# Patient Record
Sex: Male | Born: 1945 | Race: Black or African American | Hispanic: No | Marital: Single | Smoking: Never smoker
Health system: Southern US, Community
[De-identification: ages and names within clinical notes are randomized; demographics above are authoritative.]

## PROBLEM LIST (undated history)

## (undated) DIAGNOSIS — I639 Cerebral infarction, unspecified: Secondary | ICD-10-CM

## (undated) DIAGNOSIS — E119 Type 2 diabetes mellitus without complications: Secondary | ICD-10-CM

## (undated) DIAGNOSIS — I10 Essential (primary) hypertension: Secondary | ICD-10-CM

---

## 2019-09-19 ENCOUNTER — Encounter (HOSPITAL_COMMUNITY): Payer: Self-pay

## 2019-09-19 ENCOUNTER — Ambulatory Visit (INDEPENDENT_AMBULATORY_CARE_PROVIDER_SITE_OTHER): Payer: Medicare Other

## 2019-09-19 ENCOUNTER — Ambulatory Visit (HOSPITAL_COMMUNITY)
Admission: EM | Admit: 2019-09-19 | Discharge: 2019-09-19 | Disposition: A | Discharge status: Home / Self Care | Payer: Medicare Other | Attending: Family Medicine | Admitting: Family Medicine

## 2019-09-19 DIAGNOSIS — R059 Cough, unspecified: Secondary | ICD-10-CM

## 2019-09-19 DIAGNOSIS — J069 Acute upper respiratory infection, unspecified: Secondary | ICD-10-CM | POA: Diagnosis present

## 2019-09-19 DIAGNOSIS — I10 Essential (primary) hypertension: Secondary | ICD-10-CM | POA: Insufficient documentation

## 2019-09-19 DIAGNOSIS — R030 Elevated blood-pressure reading, without diagnosis of hypertension: Secondary | ICD-10-CM | POA: Diagnosis not present

## 2019-09-19 DIAGNOSIS — R05 Cough: Secondary | ICD-10-CM | POA: Insufficient documentation

## 2019-09-19 DIAGNOSIS — M47814 Spondylosis without myelopathy or radiculopathy, thoracic region: Secondary | ICD-10-CM | POA: Diagnosis not present

## 2019-09-19 DIAGNOSIS — U071 COVID-19: Secondary | ICD-10-CM | POA: Insufficient documentation

## 2019-09-19 DIAGNOSIS — I160 Hypertensive urgency: Secondary | ICD-10-CM | POA: Diagnosis not present

## 2019-09-19 IMAGING — DX DG CHEST 2V
2 series · 2 of 2 positions shown · non-contrast
Comparison: None.

CLINICAL DATA: Per pt: sick for a week, dry cough, no fever, today
BP was up. No history of cardiac or respiratory disease. Non smoker,
no HBP, no diabetes. Tested today for Covid

EXAM:
CHEST - 2 VIEW

[chest pa]
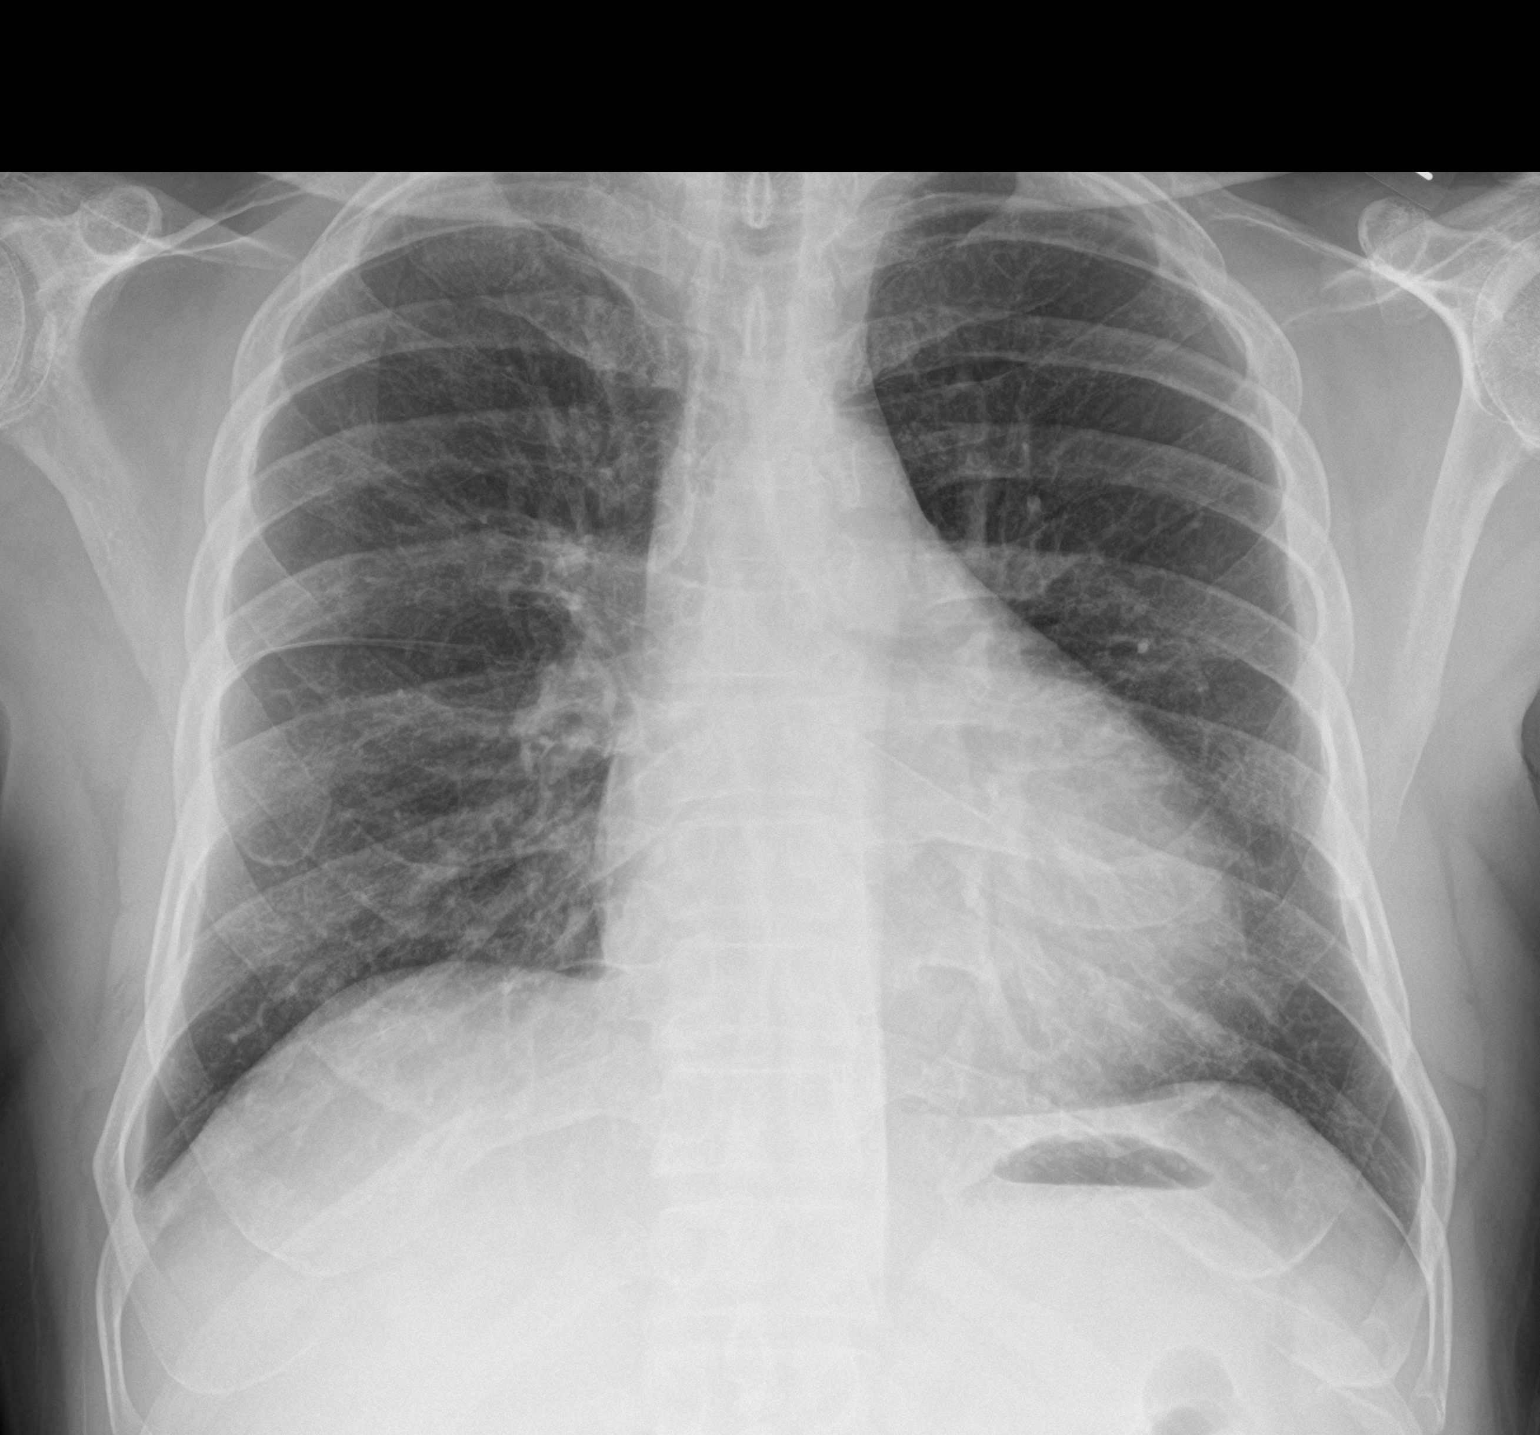

[chest lat]
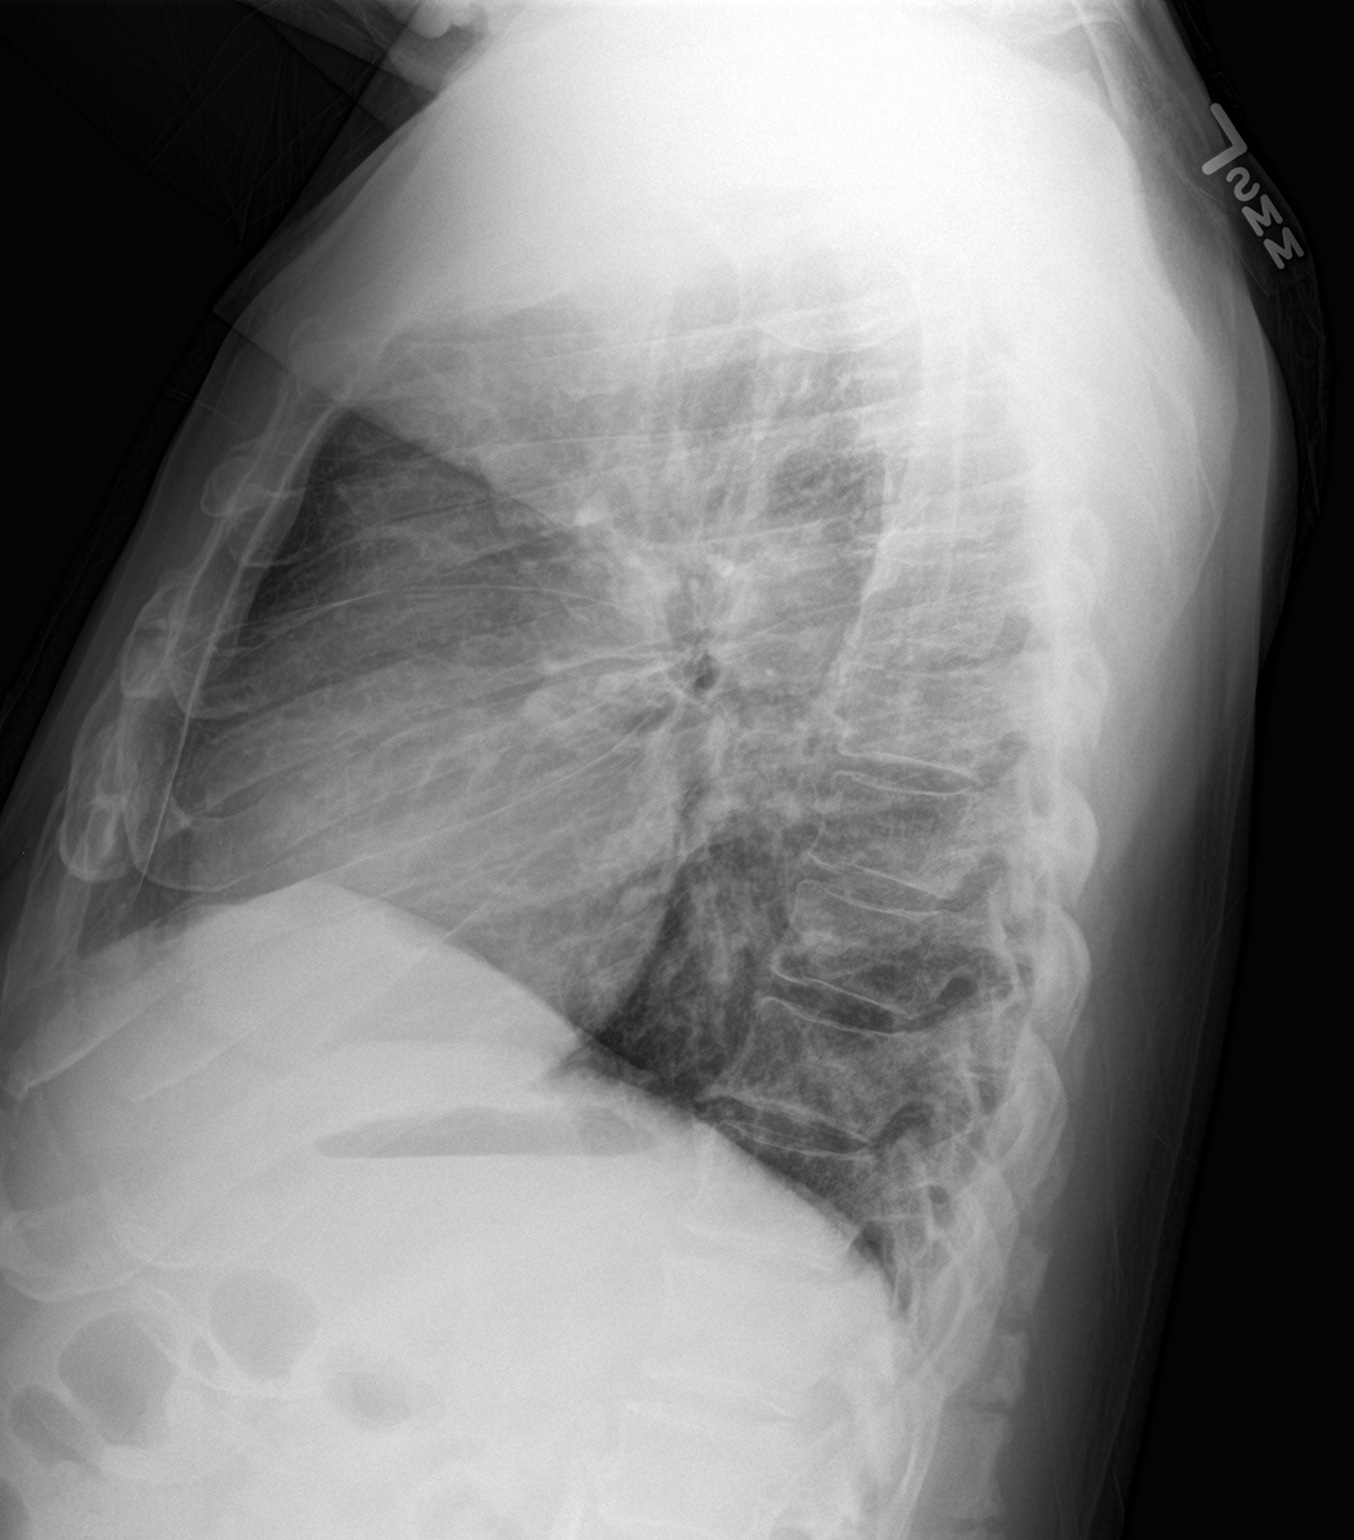

[2 of 2 positions shown; findings below may reference images not displayed]

FINDINGS: Normal mediastinal contours. Heart size is upper limits of normal.
The lungs are clear. No pneumothorax or pleural effusion. Mild
degenerative changes in the thoracic spine.
IMPRESSION: No active cardiopulmonary disease.

## 2019-09-19 MED ORDER — BENZONATATE 100 MG PO CAPS: 100.0000 mg | ORAL_CAPSULE | Freq: Three times a day (TID) | ORAL | 0 refills | Status: DC | PRN

## 2019-09-19 MED ORDER — AMLODIPINE BESYLATE 5 MG PO TABS: 5.0000 mg | ORAL_TABLET | Freq: Every day | ORAL | 0 refills | Status: DC

## 2019-09-19 NOTE — ED Triage Notes (Signed)
Pt C/O Non productive cough, symptom started over a week.

## 2019-09-19 NOTE — ED Provider Notes (Signed)
[place]   MRN: [id number] DOB: [birth year]  Subjective:   Joseph Patrick is a 74 y.o. male presenting for 1 week history of persistent dry hacking cough.  Patient reports that he generally is in very good health, states that normally he gets a cold symptom and is resolved with over-the-counter medications.  However, this particular episode is not resolving.  Denies getting regular care, does not have a PCP.  Denies history of hypertension but has been told before at different visits that he has elevated blood pressure.  He is not currently taking any medications and has no known food or drug allergies.  Denies past medical and surgical history.  History reviewed. No pertinent family history.  Denies smoking cigarettes.  Review of Systems  Constitutional: Negative for fever and malaise/fatigue.  HENT: Negative for congestion, ear pain, sinus pain and sore throat.   Eyes: Negative for blurred vision, double vision, discharge and redness.  Respiratory: Positive for cough. Negative for hemoptysis, shortness of breath and wheezing.   Cardiovascular: Negative for chest pain.  Gastrointestinal: Negative for abdominal pain, diarrhea, nausea and vomiting.  Genitourinary: Negative for dysuria, flank pain and hematuria.  Musculoskeletal: Negative for myalgias.  Skin: Negative for rash.  Neurological: Negative for dizziness, tingling, sensory change, speech change, weakness and headaches.  Psychiatric/Behavioral: Negative for depression and substance abuse.     Objective:   Vitals: BP (!) 179/102 (BP Location: Left Arm)   Pulse (!) 103   Temp 98.8 F (37.1 C) (Oral)   Resp 18   SpO2 99%   BP recheck on left arm, seated position: 198/93. Pulse was 98 on recheck.  Physical Exam Constitutional:      General: He is not in acute distress.    Appearance: Normal appearance. He is well-developed. He is not ill-appearing, toxic-appearing or diaphoretic.  HENT:     Head:  Normocephalic and atraumatic.     Right Ear: External ear normal.     Left Ear: External ear normal.     Nose: Nose normal.     Mouth/Throat:     Mouth: Mucous membranes are moist.     Pharynx: Oropharynx is clear.  Eyes:     General: No scleral icterus.    Extraocular Movements: Extraocular movements intact.     Pupils: Pupils are equal, round, and reactive to light.  Cardiovascular:     Rate and Rhythm: Normal rate and regular rhythm.     Heart sounds: Normal heart sounds. No murmur. No friction rub. No gallop.   Pulmonary:     Effort: Pulmonary effort is normal. No respiratory distress.     Breath sounds: Normal breath sounds. No stridor. No wheezing, rhonchi or rales.  Musculoskeletal:     Right lower leg: No edema.     Left lower leg: No edema.  Skin:    General: Skin is warm and dry.  Neurological:     Mental Status: He is alert and oriented to person, place, and time.     Cranial Nerves: No cranial nerve deficit.     Motor: No weakness.     Coordination: Coordination normal.     Gait: Gait normal.     Deep Tendon Reflexes: Reflexes normal.     Comments: Negative Romberg and pronator drift.  Psychiatric:        Mood and Affect: Mood normal.        Behavior: Behavior normal.        Thought Content: Thought content normal.  Judgment: Judgment normal.     DG Chest 2 View  Result Date: 09/19/2019 CLINICAL DATA:  Per pt: sick for a week, dry cough, no fever, today BP was up. No history of cardiac or respiratory disease. Non smoker, no HBP, no diabetes. Tested today for Covid EXAM: CHEST - 2 VIEW COMPARISON:  None. FINDINGS: Normal mediastinal contours. Heart size is upper limits of normal. The lungs are clear. No pneumothorax or pleural effusion. Mild degenerative changes in the thoracic spine. IMPRESSION: No active cardiopulmonary disease. Electronically Signed   By: Emmaline Kluver M.D.   On: 09/19/2019 17:36   Assessment and Plan :   1. Cough   2. Essential  hypertension   3. Hypertensive urgency   4. Elevated blood pressure reading     Manage for viral illness, COVID-19 testing pending.  Recommended supportive care.  Patient does not have physical exam findings suggestive of intracranial process, stroke or ACS.  Start amlodipine for management of hypertension, recommended dietary modifications.  Establish care with new PCP, information provided to the patient. Counseled patient on potential for adverse effects with medications prescribed/recommended today, ER and return-to-clinic precautions discussed, patient verbalized understanding.    Wallis Bamberg, [place] 09/19/19 1743

## 2019-09-19 NOTE — Discharge Instructions (Addendum)
We will manage this as a viral syndrome. For sore throat or cough try using a honey-based tea. Use 3 teaspoons of honey with juice squeezed from half lemon. Place shaved pieces of ginger into 1/2-1 cup of water and warm over stove top. Then mix the ingredients and repeat every 4 hours as needed. Please take Tylenol 500mg  every 6 hours. Hydrate very well with at least 2 liters of water. Eat light meals such as soups to replenish electrolytes and soft fruits, veggies. Start an antihistamine like Zyrtec (cetirizine) at 10mg  daily for postnasal drainage, sinus congestion.   For diabetes, please make sure you are avoiding starchy, carbohydrate foods like pasta, breads, pastry, rice, potatoes, desserts. These foods can elevated your blood sugar. Also, limit your alcohol drinking to 1 per day, avoid sodas, sweet teas. For elevated blood pressure, make sure you are monitoring salt in your diet.  Do not eat restaurant foods and limit processed foods at home.  Processed foods include things like frozen meals preseason meats and dinners.  Make sure your pain attention to sodium labels on foods you by at the grocery store.  For seasoning you can use a brand called Mrs. Dash which includes a lot of salt free seasonings.  Salads - kale, spinach, cabbage, spring mix; use seeds like pumpkin seeds or sunflower seeds, almonds; you can also use 1-2 hard boiled eggs in your salads Fruits - avocadoes, berries (blueberries, raspberries, blackberries), apples, oranges Vegetables - aspargus, cauliflower, broccoli, green beans, brussel spouts, bell peppers; stay away from starchy vegetables like potatoes, carrots, peas  Regarding meat it is better to eat lean meats and limit your red meat consumption including pork.  Wild caught fish, chicken breast are good options.  Do not eat any foods on this list that you are allergic to.

## 2019-09-20 LAB — NOVEL CORONAVIRUS, NAA (HOSP ORDER, SEND-OUT TO REF LAB; TAT 18-24 HRS): SARS-CoV-2, NAA: DETECTED — AB

## 2019-09-22 ENCOUNTER — Telehealth: Payer: Self-pay | Admitting: Emergency Medicine

## 2019-09-22 NOTE — Telephone Encounter (Signed)
Your test for COVID-19 was positive, meaning that you were infected with the novel coronavirus and could give the germ to others.  Please continue isolation at home for at least 10 days since the start of your symptoms. If you do not have symptoms, please isolate at home for 10 days from the day you were tested. Once you complete your 10 day quarantine, you may return to normal activities as long as you've not had a fever for over 24 hours(without taking fever reducing medicine) and your symptoms are improving. Please continue good preventive care measures, including:  frequent hand-washing, avoid touching your face, cover coughs/sneezes, stay out of crowds and keep a 6 foot distance from others.  Go to the nearest hospital emergency room if fever/cough/breathlessness are severe or illness seems like a threat to life.  Attempted to reach patient. No answer at this time. Voicemail left.    

## 2019-09-23 ENCOUNTER — Telehealth (HOSPITAL_COMMUNITY): Payer: Self-pay | Admitting: Emergency Medicine

## 2019-09-23 NOTE — Telephone Encounter (Signed)
Patient contacted by phone and made aware of  positive covid  results. Pt verbalized understanding and had all questions answered.    

## 2020-02-19 ENCOUNTER — Ambulatory Visit (INDEPENDENT_AMBULATORY_CARE_PROVIDER_SITE_OTHER): Payer: Medicare Other

## 2020-02-19 ENCOUNTER — Ambulatory Visit (HOSPITAL_COMMUNITY)
Admission: EM | Admit: 2020-02-19 | Discharge: 2020-02-19 | Disposition: A | Discharge status: Home / Self Care | Payer: Medicare Other | Attending: Family Medicine | Admitting: Family Medicine

## 2020-02-19 ENCOUNTER — Other Ambulatory Visit: Payer: Self-pay

## 2020-02-19 ENCOUNTER — Encounter (HOSPITAL_COMMUNITY): Payer: Self-pay

## 2020-02-19 DIAGNOSIS — R05 Cough: Secondary | ICD-10-CM | POA: Insufficient documentation

## 2020-02-19 DIAGNOSIS — R059 Cough, unspecified: Secondary | ICD-10-CM

## 2020-02-19 DIAGNOSIS — R0602 Shortness of breath: Secondary | ICD-10-CM | POA: Insufficient documentation

## 2020-02-19 LAB — CBC
HCT: 47.5 % (ref 39.0–52.0)
Hemoglobin: 15.7 g/dL (ref 13.0–17.0)
MCH: 29.1 pg (ref 26.0–34.0)
MCHC: 33.1 g/dL (ref 30.0–36.0)
MCV: 88.1 fL (ref 80.0–100.0)
Platelets: 246 10*3/uL (ref 150–400)
RBC: 5.39 MIL/uL (ref 4.22–5.81)
RDW: 12.7 % (ref 11.5–15.5)
WBC: 5.5 10*3/uL (ref 4.0–10.5)
nRBC: 0 % (ref 0.0–0.2)

## 2020-02-19 LAB — COMPREHENSIVE METABOLIC PANEL
ALT: 23 U/L (ref 0–44)
AST: 18 U/L (ref 15–41)
Albumin: 3.7 g/dL (ref 3.5–5.0)
Alkaline Phosphatase: 74 U/L (ref 38–126)
Anion gap: 8 (ref 5–15)
BUN: 14 mg/dL (ref 8–23)
CO2: 26 mmol/L (ref 22–32)
Calcium: 9.3 mg/dL (ref 8.9–10.3)
Chloride: 103 mmol/L (ref 98–111)
Creatinine, Ser: 1.35 mg/dL — ABNORMAL HIGH (ref 0.61–1.24)
GFR calc Af Amer: 60 mL/min — ABNORMAL LOW (ref >60)
GFR calc non Af Amer: 51 mL/min — ABNORMAL LOW (ref >60)
Glucose, Bld: 259 mg/dL — ABNORMAL HIGH (ref 70–99)
Potassium: 3.9 mmol/L (ref 3.5–5.1)
Sodium: 137 mmol/L (ref 135–145)
Total Bilirubin: 0.9 mg/dL (ref 0.3–1.2)
Total Protein: 7.9 g/dL (ref 6.5–8.1)

## 2020-02-19 LAB — BRAIN NATRIURETIC PEPTIDE: B Natriuretic Peptide: 311.6 pg/mL — ABNORMAL HIGH (ref 0.0–100.0)

## 2020-02-19 IMAGING — DX DG CHEST 2V
2 series · 2 of 2 positions shown · non-contrast
Comparison: Chest x-ray [DATE].

CLINICAL DATA: 73-year-old male with history of cough, chest pain
and shortness of breath for the past 2 weeks.

EXAM:
CHEST - 2 VIEW

[chest pa]
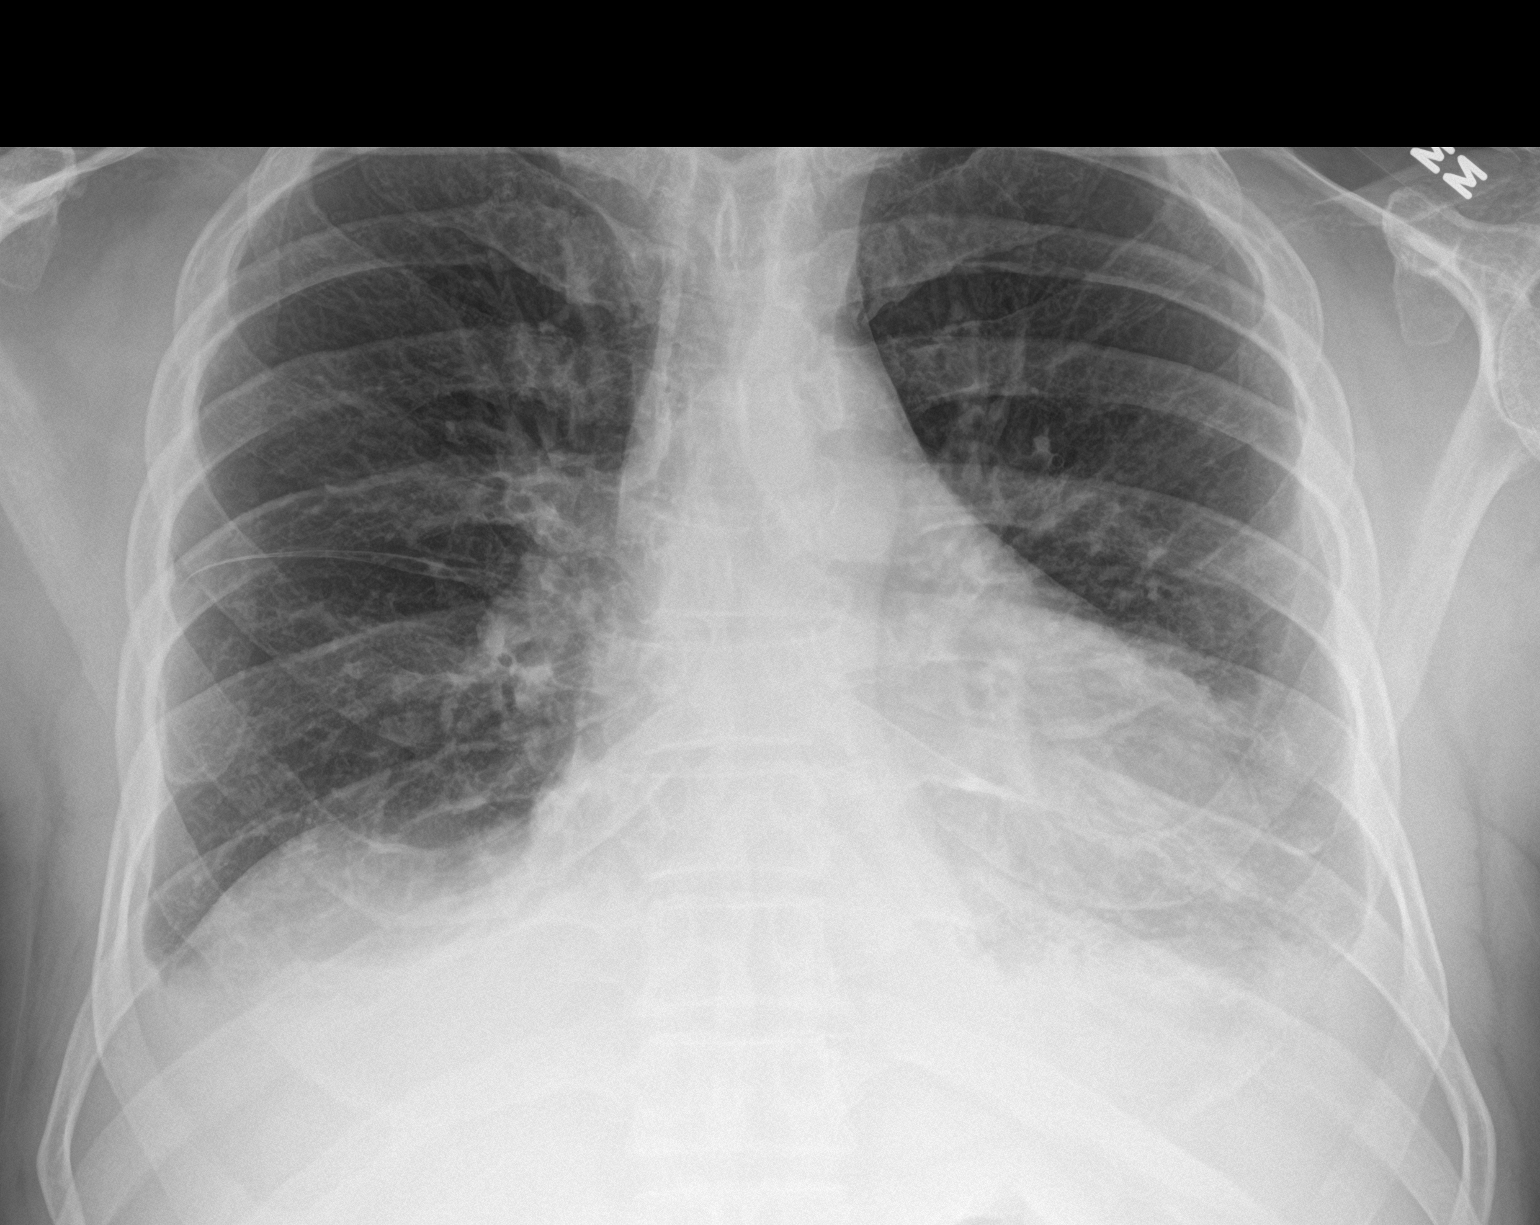

[chest lat]
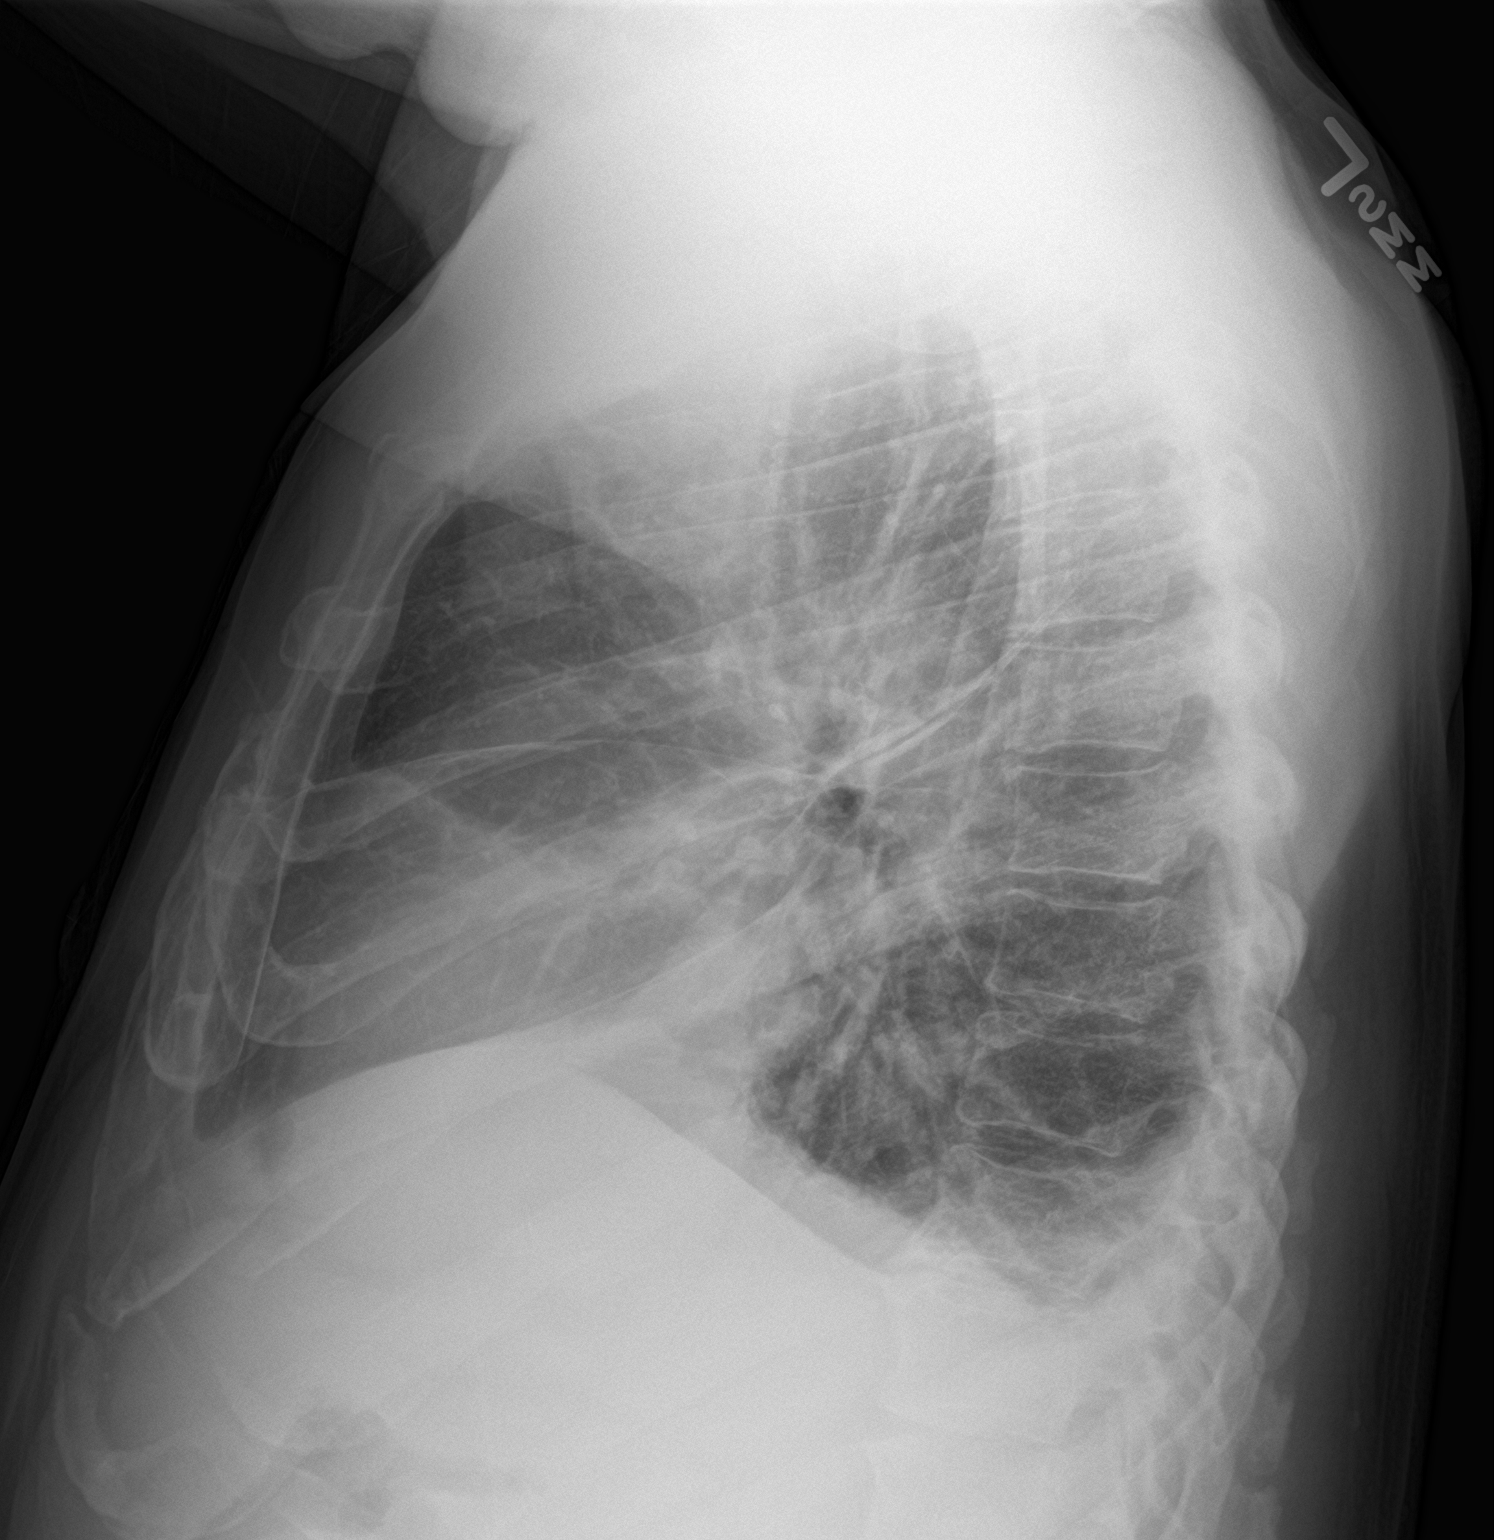

[2 of 2 positions shown; findings below may reference images not displayed]

FINDINGS: Small bilateral pleural effusions with bibasilar opacities which are
favored to reflect areas of subsegmental atelectasis. No definite
consolidative airspace disease. No pneumothorax. No evidence of
pulmonary edema. Heart size is normal. Upper mediastinal contours
are within normal limits.
IMPRESSION: 1. Small bilateral pleural effusions with probable bibasilar
subsegmental atelectasis.

## 2020-02-19 MED ORDER — METOPROLOL TARTRATE 50 MG PO TABS: 50.0000 mg | ORAL_TABLET | Freq: Two times a day (BID) | ORAL | 1 refills | Status: DC

## 2020-02-19 NOTE — ED Triage Notes (Signed)
Pt c/o non-productive cough for approx 2.5 weeks with SOB when lying down or attempting to take a deep breath and post nasal drip.  Pt also reports diarrhea four days ago, now resolved. Denies CP, diaphoresis, dizziness, or pain to left arm, back pain, or sore throat, fever, chills, abdom pain, n/v, nasal congestion.  Pt states he was Rx HTN meds when he was last seen at [facility], but only took them for 1 month and did not see a PMD for ongoing monitoring/tx.  Pt c/o recurrent calf cramps for approx 4 years with some weakness.

## 2020-02-19 NOTE — Discharge Instructions (Addendum)
Your x-ray showed some fluid in your lungs and a very enlarged heart most likely due to your uncontrolled high blood pressure. You are also tachycardic today or meaning that your heart rate is too fast I will give you medication to slow your heart rate down and decrease your blood pressure. I am doing some blood work and will call you when I get the results. Highly recommend following up with primary for referral to cardiology If your shortness of breath worsens you will need to go to the ER.

## 2020-02-19 NOTE — ED Notes (Signed)
Advised pt that we are waiting on final radiology reading of CXR and [facility] med provider to review findings.

## 2020-02-19 NOTE — ED Provider Notes (Signed)
[facility]    CSN: [id number] Arrival date & time: 02/19/20  0623      History   Chief Complaint Chief Complaint  Patient presents with  . Shortness of Breath    HPI Joseph Patrick is a 74 y.o. male.   Patient is a 74 year old male presents today for approximately 2 and half weeks of nonproductive cough shortness of breath lying down and taking a deep breath.  He has had some left-sided postnasal drip.  Symptoms, wax and wane.  No lower extremity swelling.  No chest pain, diaphoresis, dizziness, arm pain, sore throat, chills, fever, abdominal pain, nausea, vomiting.  Was seen here 4 months ago diagnosed with Covid.  At that time was started on amlodipine for extremely elevated blood pressure.  He took this for 1 month and then stopped.  He has not taken anything since.  He does not have a primary care doctor.  Denies any headache, dizziness or blurred vision.  He also has mild intermittent muscle cramps from time to time.  Admits to not drinking any water.  Very active playing golf weekly.  ROS per HPI      History reviewed. No pertinent past medical history.  There are no problems to display for this patient.   History reviewed. No pertinent surgical history.     Home Medications    Prior to Admission medications   Medication Sig Start Date End Date Taking? Authorizing Provider  metoprolol tartrate (LOPRESSOR) 50 MG tablet Take 1 tablet (50 mg total) by mouth 2 (two) times daily. 02/19/20   Minh Jasper, Gloris Manchester A, NP  amLODipine (NORVASC) 5 MG tablet Take 1 tablet (5 mg total) by mouth daily. 09/19/19 02/19/20  Wallis Bamberg, PA-C    Family History Family History  Problem Relation Age of Onset  . Heart failure Sister     Social History Social History   Tobacco Use  . Smoking status: Never Smoker  . Smokeless tobacco: Never Used  Substance Use Topics  . Alcohol use: Never  . Drug use: Never     Allergies   Patient has no known allergies.   Review of  Systems Review of Systems   Physical Exam Triage Vital Signs ED Triage Vitals  Enc Vitals Group     BP 02/19/20 0843 (!) 218/114     Pulse Rate 02/19/20 0843 (!) 115     Resp 02/19/20 0843 (!) 32     Temp 02/19/20 0843 98.6 F (37 C)     Temp Source 02/19/20 0843 Oral     SpO2 02/19/20 0843 98 %     Weight --      Height --      Head Circumference --      Peak Flow --      Pain Score 02/19/20 1055 0     Pain Loc --      Pain Edu? --      Excl. in GC? --    No data found.  Updated Vital Signs BP (!) 220/102 (BP Location: Left Arm) Comment: re-eval  Pulse (!) 108 Comment: re-eval  Temp 98.6 F (37 C) (Oral)   Resp (!) 32   SpO2 98%   Visual Acuity Right Eye Distance:   Left Eye Distance:   Bilateral Distance:    Right Eye Near:   Left Eye Near:    Bilateral Near:     Physical Exam Vitals and nursing note reviewed.  Constitutional:      General: He  is not in acute distress.    Appearance: Normal appearance. He is not ill-appearing, toxic-appearing or diaphoretic.  HENT:     Head: Normocephalic and atraumatic.     Nose: Nose normal.     Mouth/Throat:     Pharynx: Oropharynx is clear.  Eyes:     Conjunctiva/sclera: Conjunctivae normal.  Cardiovascular:     Rate and Rhythm: Tachycardia present.     Heart sounds: Normal heart sounds.  Pulmonary:     Effort: Pulmonary effort is normal. No respiratory distress.     Breath sounds: Normal breath sounds. No stridor. No wheezing, rhonchi or rales.     Comments: Decreased lung sounds in bases Speaking in full sentences.  No dyspnea or distress. No tachypnea  Chest:     Chest wall: No tenderness.  Musculoskeletal:        General: Normal range of motion.     Cervical back: Normal range of motion.     Right lower leg: No edema.     Left lower leg: No edema.  Skin:    General: Skin is warm and dry.  Neurological:     Mental Status: He is alert.  Psychiatric:        Mood and Affect: Mood normal.      UC  Treatments / Results  Labs (all labs ordered are listed, but only abnormal results are displayed) Labs Reviewed  CBC  BRAIN NATRIURETIC PEPTIDE  COMPREHENSIVE METABOLIC PANEL    EKG   Radiology DG Chest 2 View  Result Date: 02/19/2020 CLINICAL DATA:  74 year old male with history of cough, chest pain and shortness of breath for the past 2 weeks. EXAM: CHEST - 2 VIEW COMPARISON:  Chest x-ray 09/19/2019. FINDINGS: Small bilateral pleural effusions with bibasilar opacities which are favored to reflect areas of subsegmental atelectasis. No definite consolidative airspace disease. No pneumothorax. No evidence of pulmonary edema. Heart size is normal. Upper mediastinal contours are within normal limits. IMPRESSION: 1. Small bilateral pleural effusions with probable bibasilar subsegmental atelectasis. Electronically Signed   By: Vinnie Langton M.D.   On: 02/19/2020 09:53    Procedures Procedures (including critical care time)  Medications Ordered in UC Medications - No data to display  Initial Impression / Assessment and Plan / UC Course  I have reviewed the triage vital signs and the nursing notes.  Pertinent labs & imaging results that were available during my care of the patient were reviewed by me and considered in my medical decision making (see chart for details).     Cough and shortness of breath Patient x-ray with small bilateral pleural effusions with atelectasis. This is most likely the cause of his symptoms.  He has extremely elevated blood pressure here today at 220/102. EKG with tachycardia and LVH most likely due to uncontrolled high blood pressure. Went over case with Dr. Lanny Cramp.  Recommend draw basic lab work and BNP Start on metoprolol twice daily for rate and blood pressure control. Spoke with patient multiple times about seriousness of him following up with primary care and cardiology sooner than later.  Patient reporting he will call today to make an appointment  for primary care Recommend if symptoms worsen he will need to go to the ER. Patient understand and agree. Final Clinical Impressions(s) / UC Diagnoses   Final diagnoses:  SOB (shortness of breath)  Cough     Discharge Instructions     Your x-ray showed some fluid in your lungs and a very enlarged heart most  likely due to your uncontrolled high blood pressure. You are also tachycardic today or meaning that your heart rate is too fast I will give you medication to slow your heart rate down and decrease your blood pressure. I am doing some blood work and will call you when I get the results. Highly recommend following up with primary for referral to cardiology If your shortness of breath worsens you will need to go to the ER.    ED Prescriptions    Medication Sig Dispense Auth. Provider   metoprolol tartrate (LOPRESSOR) 50 MG tablet Take 1 tablet (50 mg total) by mouth 2 (two) times daily. 60 tablet Kilan Banfill A, NP     PDMP not reviewed this encounter.   Janace Aris, NP 02/19/20 1119

## 2020-02-22 ENCOUNTER — Ambulatory Visit (HOSPITAL_COMMUNITY): Payer: Medicare Other

## 2020-02-22 ENCOUNTER — Ambulatory Visit (HOSPITAL_COMMUNITY)
Admission: EM | Admit: 2020-02-22 | Discharge: 2020-02-22 | Disposition: A | Discharge status: Home / Self Care | Payer: Medicare Other | Attending: Family Medicine | Admitting: Family Medicine

## 2020-02-22 ENCOUNTER — Ambulatory Visit (INDEPENDENT_AMBULATORY_CARE_PROVIDER_SITE_OTHER): Payer: Medicare Other

## 2020-02-22 ENCOUNTER — Encounter (HOSPITAL_COMMUNITY): Payer: Self-pay | Admitting: Emergency Medicine

## 2020-02-22 ENCOUNTER — Other Ambulatory Visit: Payer: Self-pay

## 2020-02-22 DIAGNOSIS — R739 Hyperglycemia, unspecified: Secondary | ICD-10-CM | POA: Diagnosis not present

## 2020-02-22 DIAGNOSIS — R062 Wheezing: Secondary | ICD-10-CM | POA: Diagnosis not present

## 2020-02-22 DIAGNOSIS — R0602 Shortness of breath: Secondary | ICD-10-CM

## 2020-02-22 DIAGNOSIS — R351 Nocturia: Secondary | ICD-10-CM

## 2020-02-22 DIAGNOSIS — R05 Cough: Secondary | ICD-10-CM | POA: Diagnosis not present

## 2020-02-22 DIAGNOSIS — R030 Elevated blood-pressure reading, without diagnosis of hypertension: Secondary | ICD-10-CM

## 2020-02-22 LAB — CBG MONITORING, ED: Glucose-Capillary: 276 mg/dL — ABNORMAL HIGH (ref 70–99)

## 2020-02-22 IMAGING — DX DG CHEST 2V
2 series · 2 of 2 positions shown · non-contrast
Comparison: [DATE].

CLINICAL DATA: Increased shortness of breath, wheezing and cough.

EXAM:
CHEST - 2 VIEW

[chest pa]
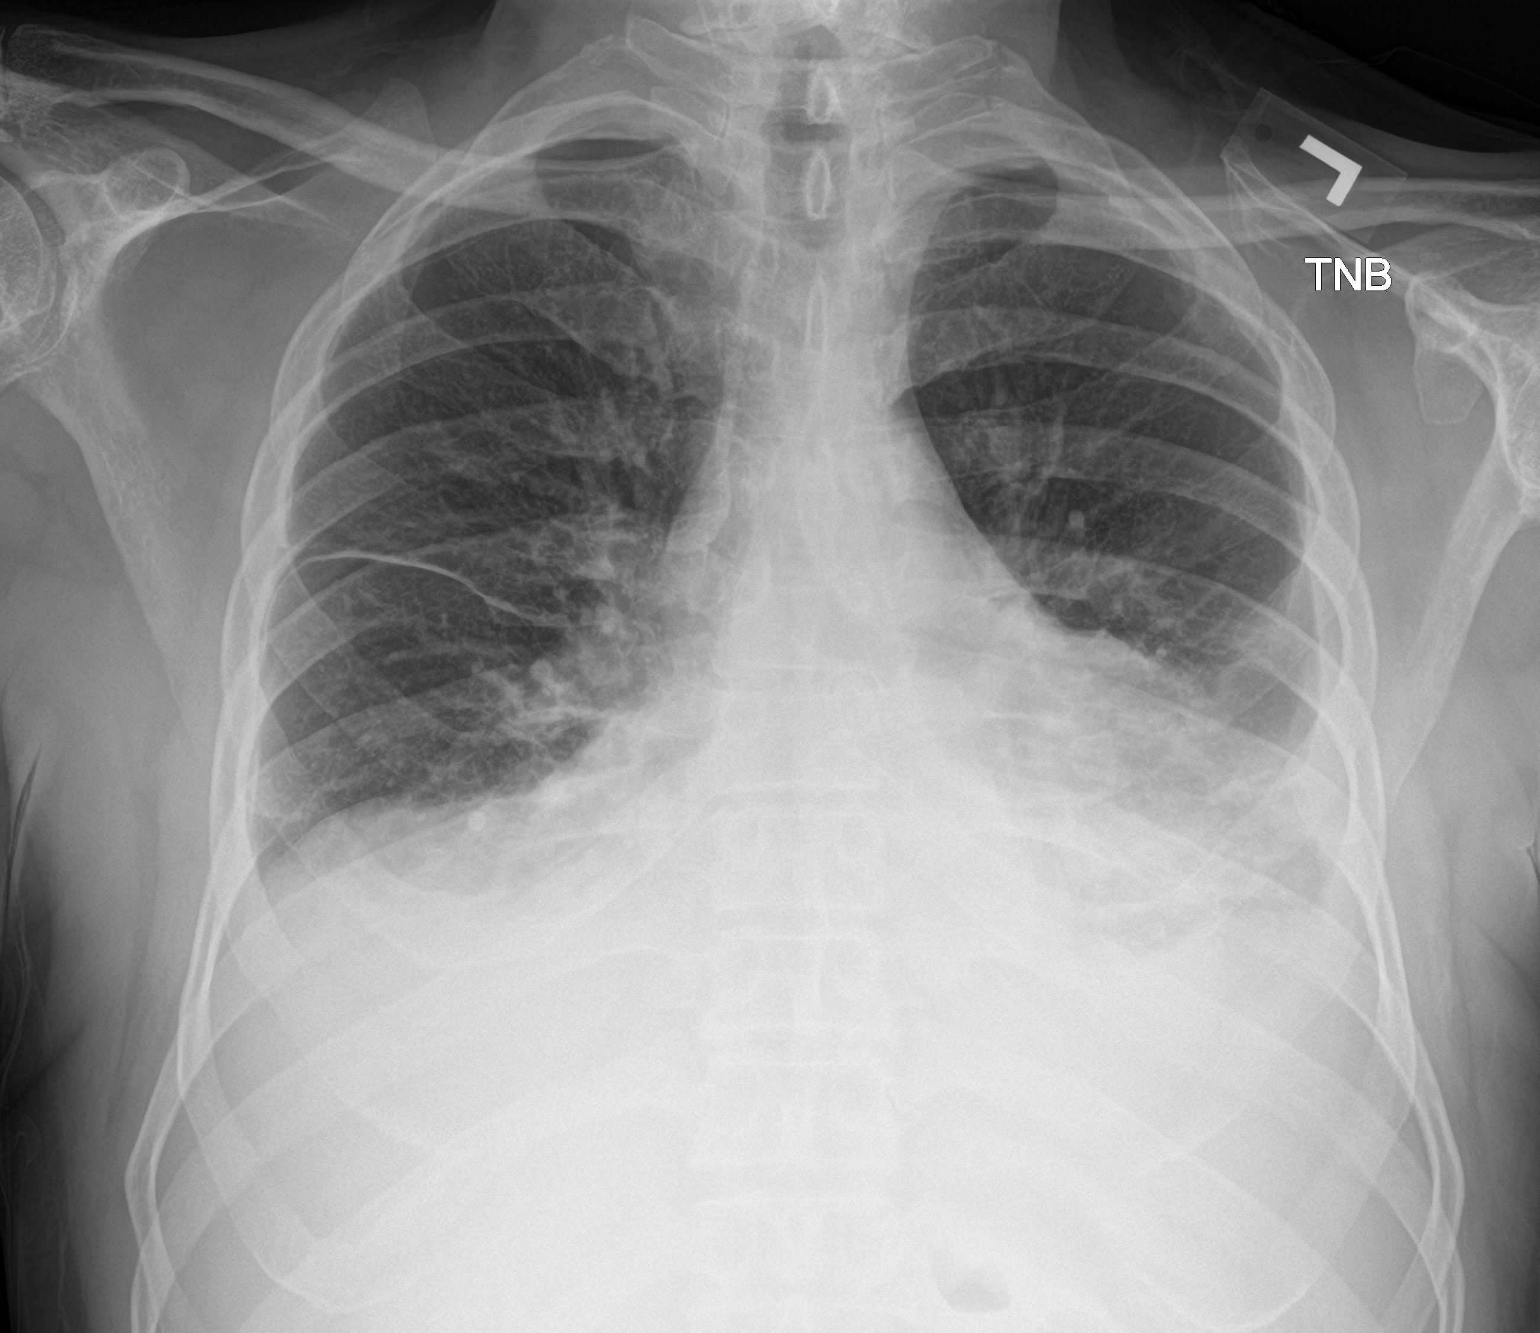

[chest lat]
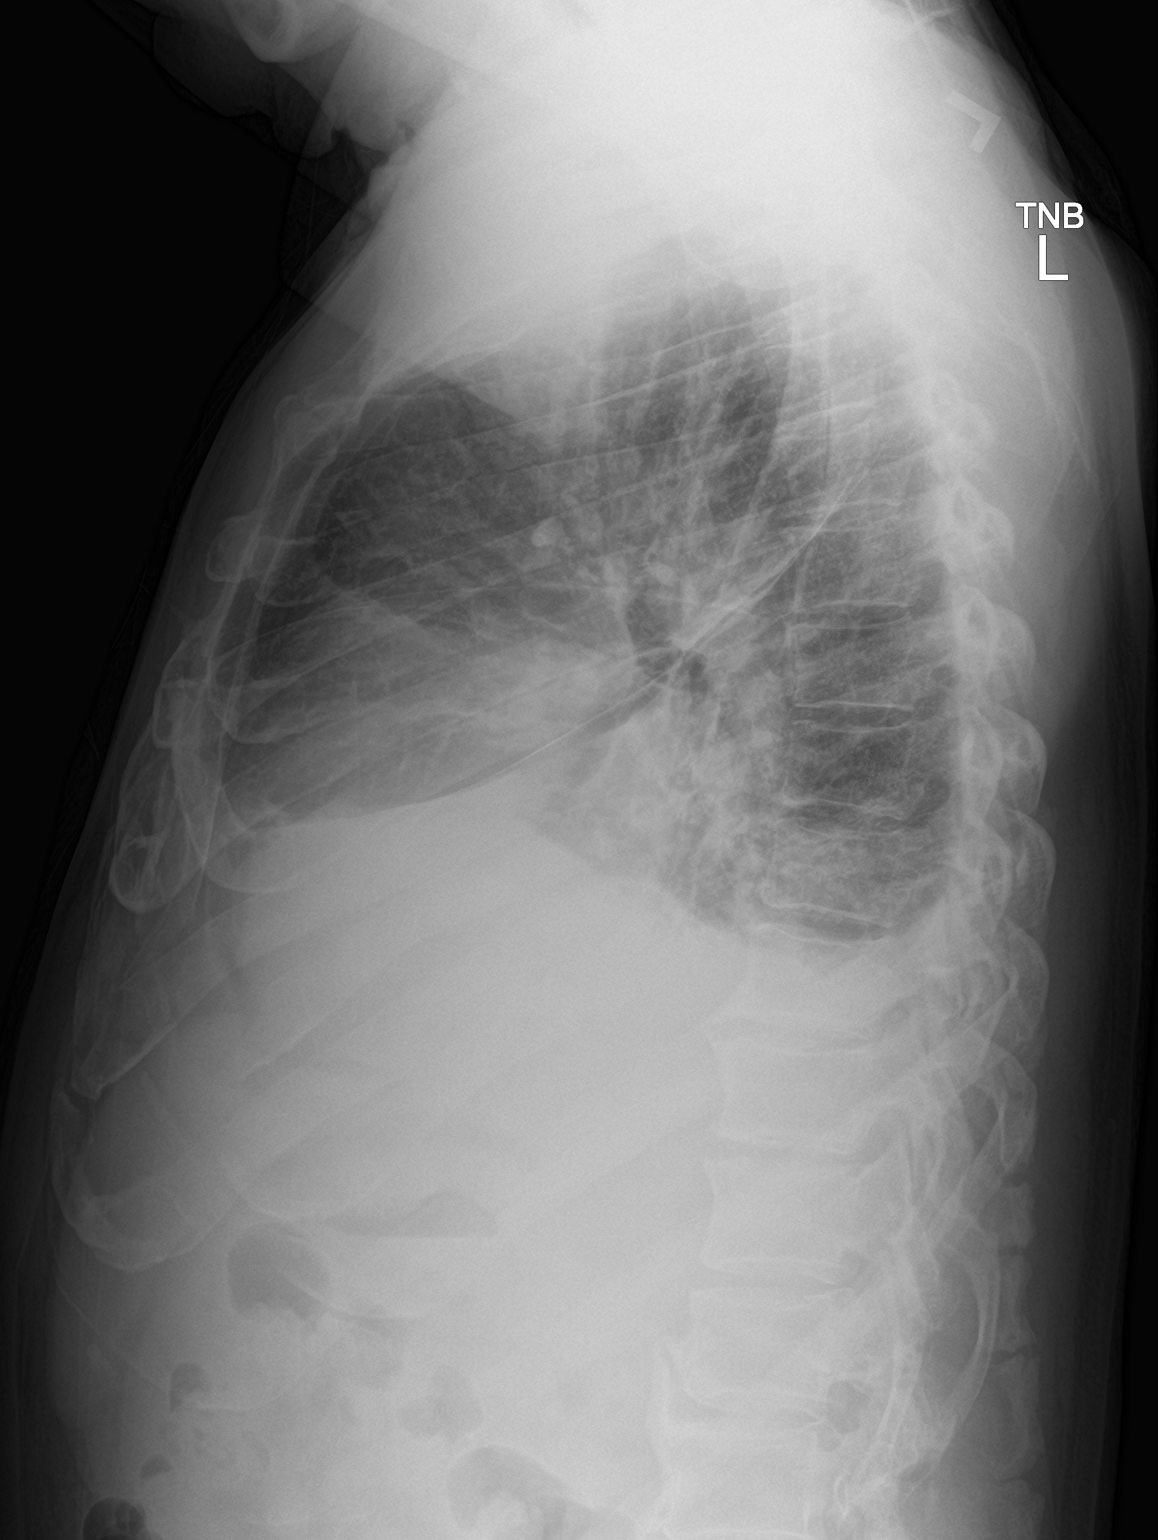

[2 of 2 positions shown; findings below may reference images not displayed]

FINDINGS: Trachea is midline. Heart is enlarged, stable. Mild bibasilar
airspace opacification with small bilateral pleural effusions, left
greater than right. Findings appear progressive from [DATE].
IMPRESSION: Probable congestive heart failure with slight progression from
[DATE]. Bibasilar pneumonia cannot be excluded.

## 2020-02-22 MED ORDER — FUROSEMIDE 20 MG PO TABS: ORAL_TABLET | ORAL | 0 refills | Status: DC

## 2020-02-22 MED ORDER — METFORMIN HCL 500 MG PO TABS: 500.0000 mg | ORAL_TABLET | Freq: Two times a day (BID) | ORAL | 0 refills | Status: DC

## 2020-02-22 NOTE — Discharge Instructions (Addendum)
Please read through the information on heart failure and elevated blood sugar included in your AVS today.  Your blood pressure was noted to be elevated during your visit today. If you are currently taking medication for high blood pressure, please ensure you are taking this as directed. If you do not have a history of high blood pressure and your blood pressure remains persistently elevated, you may need to begin taking a medication at some point. You may return here within the next few days to recheck if unable to see your primary care provider or if do not have a one.  BP (!) 158/102 (BP Location: Left Arm)   Pulse 89   Temp 98.7 F (37.1 C) (Oral)   Resp (!) 22   SpO2 98%

## 2020-02-22 NOTE — ED Triage Notes (Signed)
Pt c/o SOB when lying down and thinks that he has fluid on his lungs. Pt was seen on Friday and believes he was misdiagnosed. His cough is not any better. Pt has SOB on exertion. He was able to drive himself today. Pt states he is sob after walking to exam room today.

## 2020-02-24 NOTE — ED Provider Notes (Signed)
[facility]   [id number] 02/22/20 Arrival Time: 1950  ASSESSMENT & PLAN:  1. Shortness of breath   2. Hyperglycemia   3. Nocturia more than twice per night   4. Elevated blood pressure reading without diagnosis of hypertension     Did my best to explain suspected diagnosis of CHF and new onset DM. No indication for hospitalization at this time.  Reports he has new PCP f/u in two weeks. Will address further DM management.  Begin: Meds ordered this encounter  Medications   furosemide (LASIX) 20 MG tablet    Sig: Take 1-2 tablets daily for fluid in your lungs.    Dispense:  30 tablet    Refill:  0   metFORMIN (GLUCOPHAGE) 500 MG tablet    Sig: Take 1 tablet (500 mg total) by mouth 2 (two) times daily with a meal.    Dispense:  60 tablet    Refill:  0     Recommend: Follow-up Information    Schedule an appointment as soon as possible for a visit  with [facility] [address] Office.   Specialty: Cardiology Contact information: [address], [address] [place] [address] [phone]          I have personally viewed the imaging studies ordered this visit. Small bilateral pleural effusions. Compared to CXR dated 02/19/2020.   Reviewed expectations re: course of current medical issues. Questions answered. Outlined signs and symptoms indicating need for more acute intervention. Patient verbalized understanding. After Visit Summary given.   SUBJECTIVE:  History from: patient. Joseph Patrick is a 74 y.o. male who was seen here on 6/4/20212; notes and imaging reviewed. Continued complaint of feeling SOB esp when supine. + PND/orthopnea; feels a little worse over the past couple of days. Is taking metoprolol as prescribed. No associated CP/n/v. Able to perform ADL. Feels SOB after short exertion today. Afebrile. No recent illnesses. No abdominal or back pain. Occasional urinary frequency; nocturia 2-3 times per night. Normal PO intake and  appetite. Feels weight is stable. No LE edema or pain reported.  Illicit drug use: none.  Social History   Tobacco Use  Smoking Status Never Smoker  Smokeless Tobacco Never Used   Social History   Substance and Sexual Activity  Alcohol Use Never     OBJECTIVE:  Vitals:   02/22/20 0855  BP: (!) 158/102  Pulse: 89  Resp: (!) 22  Temp: 98.7 F (37.1 C)  TempSrc: Oral  SpO2: 98%    Recheck RR: 18  General appearance: alert, oriented, no acute distress Eyes: PERRLA; EOMI; conjunctivae normal HENT: normocephalic; atraumatic Neck: supple with FROM Lungs: without labored respirations; speaks full sentences without difficulty; overall CTAB but question slight bilateral lower rales Heart: regular Chest Wall: without tenderness to palpation Abdomen: soft, non-tender; no guarding or rebound tenderness Extremities: without edema; without calf swelling or tenderness; symmetrical without gross deformities Skin: warm and dry; without rash or lesions Neuro: normal gait Psychological: alert and cooperative; normal mood and affect  Labs: Results for orders placed or performed during the hospital encounter of 02/22/20  POC CBG monitoring  Result Value Ref Range   Glucose-Capillary 276 (H) 70 - 99 mg/dL   Labs Reviewed  CBG MONITORING, ED - Abnormal; Notable for the following components:      Result Value   Glucose-Capillary 276 (*)    All other components within normal limits    Imaging: DG Chest 2 View  Result Date: 02/22/2020 CLINICAL DATA:  Increased shortness of breath, wheezing and cough. EXAM: CHEST - 2 VIEW COMPARISON:  02/19/2020. FINDINGS: Trachea is midline. Heart is enlarged, stable. Mild bibasilar airspace opacification with small bilateral pleural effusions, left greater than right. Findings appear progressive from 02/19/2020. IMPRESSION: Probable congestive heart failure with slight progression from 02/19/2020. Bibasilar pneumonia cannot be excluded.  Electronically Signed   By: Lorin Picket M.D.   On: 02/22/2020 10:30   DG Chest 2 View  Result Date: 02/19/2020 CLINICAL DATA:  74 year old male with history of cough, chest pain and shortness of breath for the past 2 weeks. EXAM: CHEST - 2 VIEW COMPARISON:  Chest x-ray 09/19/2019. FINDINGS: Small bilateral pleural effusions with bibasilar opacities which are favored to reflect areas of subsegmental atelectasis. No definite consolidative airspace disease. No pneumothorax. No evidence of pulmonary edema. Heart size is normal. Upper mediastinal contours are within normal limits. IMPRESSION: 1. Small bilateral pleural effusions with probable bibasilar subsegmental atelectasis. Electronically Signed   By: Vinnie Langton M.D.   On: 02/19/2020 09:53     No Known Allergies  History reviewed. No pertinent past medical history.   Social History   Socioeconomic History   Marital status: Single    Spouse name: Not on file   Number of children: Not on file   Years of education: Not on file   Highest education level: Not on file  Occupational History   Not on file  Tobacco Use   Smoking status: Never Smoker   Smokeless tobacco: Never Used  Substance and Sexual Activity   Alcohol use: Never   Drug use: Never   Sexual activity: Not on file  Other Topics Concern   Not on file  Social History Narrative   Not on file   Social Determinants of Health   Financial Resource Strain:    Difficulty of Paying Living Expenses:   Food Insecurity:    Worried About [place] in the Last Year:    Arboriculturist in the Last Year:   Transportation Needs:    Film/video editor (Medical):    Lack of Transportation (Non-Medical):   Physical Activity:    Days of Exercise per Week:    Minutes of Exercise per Session:   Stress:    Feeling of Stress :   Social Connections:    Frequency of Communication with Friends and Family:    Frequency of Social Gatherings with  Friends and Family:    Attends Religious Services:    Active Member of Clubs or Organizations:    Attends Music therapist:    Marital Status:   Intimate Partner Violence:    Fear of Current or Ex-Partner:    Emotionally Abused:    Physically Abused:    Sexually Abused:    Family History  Problem Relation Age of Onset   Heart failure Sister    History reviewed. No pertinent surgical history.   Vanessa Kick, MD 02/24/20 1144

## 2020-03-10 ENCOUNTER — Ambulatory Visit: Payer: Medicare Other | Admitting: Cardiology

## 2020-03-28 ENCOUNTER — Ambulatory Visit (INDEPENDENT_AMBULATORY_CARE_PROVIDER_SITE_OTHER): Payer: Self-pay | Admitting: Cardiology

## 2020-03-28 ENCOUNTER — Encounter: Payer: Self-pay | Admitting: Cardiology

## 2020-03-28 ENCOUNTER — Other Ambulatory Visit: Payer: Self-pay

## 2020-03-28 VITALS — BP 160/84 | HR 82 | Ht 69.0 in | Wt 177.0 lb

## 2020-03-28 DIAGNOSIS — I10 Essential (primary) hypertension: Secondary | ICD-10-CM

## 2020-03-28 DIAGNOSIS — Z13220 Encounter for screening for lipoid disorders: Secondary | ICD-10-CM

## 2020-03-28 DIAGNOSIS — R739 Hyperglycemia, unspecified: Secondary | ICD-10-CM

## 2020-03-28 DIAGNOSIS — R0602 Shortness of breath: Secondary | ICD-10-CM

## 2020-03-28 MED ORDER — AMLODIPINE BESYLATE 5 MG PO TABS: 5.0000 mg | ORAL_TABLET | Freq: Every day | ORAL | 3 refills | Status: DC

## 2020-03-28 NOTE — Progress Notes (Signed)
Cardiology Office Note:    Date:  03/28/2020   ID:  [name], DOB 1946-05-22, MRN [id number]  PCP:  Patient, No Pcp Per  Cardiologist:  No primary care provider on file.  Electrophysiologist:  None   Referring MD: Mardella Layman, MD   Chief Complaint  Patient presents with  . Follow-up  . Shortness of Breath    History of Present Illness:    Joseph Patrick is a 74 y.o. male with a hx of COVID-19 infection in January 2021 who presents as an urgent care follow-up for shortness of breath.  Presented to urgent care on 02/19/2020 with shortness of breath.  BP up to 220/102.  Chest x-ray with small bilateral pleural effusions with atelectasis.  Labs notable for BNP 312, sodium 137, creatinine 1.35, albumin 3.7, hemoglobin 15.7, WBC 5.5.  He was started on metoprolol 50 mg twice daily.  He presented back to urgent care on 02/22/2020 with shortness of breath.  He was started on Lasix 20 mg daily for suspected CHF.  He was also noted to have elevated glucose and Metformin was started.  He reports that he started having cough and dyspnea 1 month ago.  States that it resolved with Lasix.  Currently denies any shortness of breath.  Denies having any chest pain.  States that since he was told he had diabetes last month in urgent care, he stopped drinking sweet tea.  He was drinking 2 bottles of sweet tea per day.  Switched to water.  For exercise he golfs 3-4 times per week.  Will drive the course but sometimes walk.  He denies any lightheadedness, syncope, or palpitations.  No smoking history.  Family history includes both his brother and sister had CHF.   Wt Readings from Last 3 Encounters:  03/28/20 177 lb (80.3 kg)     History reviewed. No pertinent past medical history.  History reviewed. No pertinent surgical history.  Current Medications: Current Meds  Medication Sig  . furosemide (LASIX) 20 MG tablet Take 1-2 tablets daily for fluid in your lungs.  . metFORMIN (GLUCOPHAGE) 500 MG tablet  Take 1 tablet (500 mg total) by mouth 2 (two) times daily with a meal.  . [DISCONTINUED] metoprolol tartrate (LOPRESSOR) 50 MG tablet Take 1 tablet (50 mg total) by mouth 2 (two) times daily.     Allergies:   Patient has no known allergies.   Social History   Socioeconomic History  . Marital status: Single    Spouse name: Not on file  . Number of children: Not on file  . Years of education: Not on file  . Highest education level: Not on file  Occupational History  . Not on file  Tobacco Use  . Smoking status: Never Smoker  . Smokeless tobacco: Never Used  Vaping Use  . Vaping Use: Never used  Substance and Sexual Activity  . Alcohol use: Never  . Drug use: Never  . Sexual activity: Not on file  Other Topics Concern  . Not on file  Social History Narrative  . Not on file   Social Determinants of Health   Financial Resource Strain:   . Difficulty of Paying Living Expenses:   Food Insecurity:   . Worried About Programme researcher, broadcasting/film/video in the Last Year:   . Barista in the Last Year:   Transportation Needs:   . Freight forwarder (Medical):   Marland Kitchen Lack of Transportation (Non-Medical):   Physical Activity:   . Days of  Exercise per Week:   . Minutes of Exercise per Session:   Stress:   . Feeling of Stress :   Social Connections:   . Frequency of Communication with Friends and Family:   . Frequency of Social Gatherings with Friends and Family:   . Attends Religious Services:   . Active Member of Clubs or Organizations:   . Attends Banker Meetings:   Marland Kitchen Marital Status:      Family History: The patient's family history includes Heart failure in his sister.  ROS:   Please see the history of present illness.    All other systems reviewed and are negative.  EKGs/Labs/Other Studies Reviewed:    The following studies were reviewed today:   EKG:  EKG is  ordered today.  The ekg ordered today demonstrates normal sinus rhythm, rate 82, LVH, T wave  inversion in leads V5/6  Recent Labs: 02/19/2020: ALT 23; B Natriuretic Peptide 311.6; BUN 14; Creatinine, Ser 1.35; Hemoglobin 15.7; Platelets 246; Potassium 3.9; Sodium 137  Recent Lipid Panel No results found for: CHOL, TRIG, HDL, CHOLHDL, VLDL, LDLCALC, LDLDIRECT  Physical Exam:    VS:  BP (!) 160/84 (BP Location: Left Arm, Patient Position: Sitting, Cuff Size: Normal)   Pulse 82   Ht 5\' 9"  (1.753 m)   Wt 177 lb (80.3 kg)   BMI 26.14 kg/m     Wt Readings from Last 3 Encounters:  03/28/20 177 lb (80.3 kg)     GEN: Well nourished, well developed in no acute distress HEENT: Normal NECK: No JVD; No carotid bruits LYMPHATICS: No lymphadenopathy CARDIAC: RRR, no murmurs, rubs, gallops RESPIRATORY:  Clear to auscultation without rales, wheezing or rhonchi  ABDOMEN: Soft, non-tender, non-distended MUSCULOSKELETAL:  No edema; No deformity  SKIN: Warm and dry NEUROLOGIC:  Alert and oriented x 3 PSYCHIATRIC:  Normal affect   ASSESSMENT:    1. Shortness of breath   2. Hyperglycemia   3. Hypertension, unspecified type   4. Lipid screening    PLAN:    Shortness of breath: Unclear cause.  Heart failure on differential, will evaluate further with echocardiogram.  Currently appears euvolemic.  States he has been off Lasix for about 1 week.  Will check BNP, BMET.  Will hold Lasix and follow-up results of echocardiogram, BNP.  Hyperglycemia: Started on Metformin.  Will check A1c to confirm diagnosis of diabetes  Hypertension: BP up to 220/102 at initial urgent care visit.  Started on metoprolol 50 mg twice daily but states he has been off any medications for the last several days.  Will start amlodipine 5 mg daily.  Asked patient to check BP daily for next 2 weeks and call with results.  RTC in 6 weeks  Medication Adjustments/Labs and Tests Ordered: Current medicines are reviewed at length with the patient today.  Concerns regarding medicines are outlined above.  Orders Placed This  Encounter  Procedures  . Hemoglobin A1c  . Lipid panel  . Basic metabolic panel  . Brain natriuretic peptide  . EKG 12-Lead  . ECHOCARDIOGRAM COMPLETE   Meds ordered this encounter  Medications  . amLODipine (NORVASC) 5 MG tablet    Sig: Take 1 tablet (5 mg total) by mouth daily.    Dispense:  90 tablet    Refill:  3    Patient Instructions  Medication Instructions:  START amlodipine 5 mg daily  *If you need a refill on your cardiac medications before your next appointment, please call your pharmacy*  Lab Work: BMET, BNP, Lipid, A1C If you have labs (blood work) drawn today and your tests are completely normal, you will receive your results only by: Marland Kitchen MyChart Message (if you have MyChart) OR . A paper copy in the mail If you have any lab test that is abnormal or we need to change your treatment, we will call you to review the results.   Testing/Procedures: Your physician has requested that you have an echocardiogram. Echocardiography is a painless test that uses sound waves to create images of your heart. It provides your doctor with information about the size and shape of your heart and how well your heart's chambers and valves are working. This procedure takes approximately one hour. There are no restrictions for this procedure. This will be done at our [facility] location:  [address]  Follow-Up: At [facility], you and your health needs are our priority.  As part of our continuing mission to provide you with exceptional heart care, we have created designated Provider Care Teams.  These Care Teams include your primary Cardiologist (physician) and Advanced Practice Providers (APPs -  Physician Assistants and Nurse Practitioners) who all work together to provide you with the care you need, when you need it.  We recommend signing up for the patient portal called "MyChart".  Sign up information is provided on this After Visit Summary.  MyChart is used  to connect with patients for Virtual Visits (Telemedicine).  Patients are able to view lab/test results, encounter notes, upcoming appointments, etc.  Non-urgent messages can be sent to your provider as well.   To learn more about what you can do with MyChart, go to [email or web address].    Your next appointment:   6 week(s)  The format for your next appointment:   In Person  Provider:   You may see Dr. Bjorn Pippin or one of the following Advanced Practice Providers on your designated Care Team:    Theodore Demark, PA-C  Joni Reining, DNP, ANP  Cadence Fransico Michael, PA-C      Signed, Joseph Ishikawa, MD  03/28/2020 6:13 PM    [facility]

## 2020-03-28 NOTE — Patient Instructions (Signed)
Medication Instructions:  START amlodipine 5 mg daily  *If you need a refill on your cardiac medications before your next appointment, please call your pharmacy*   Lab Work: BMET, BNP, Lipid, A1C If you have labs (blood work) drawn today and your tests are completely normal, you will receive your results only by: Marland Kitchen MyChart Message (if you have MyChart) OR . A paper copy in the mail If you have any lab test that is abnormal or we need to change your treatment, we will call you to review the results.   Testing/Procedures: Your physician has requested that you have an echocardiogram. Echocardiography is a painless test that uses sound waves to create images of your heart. It provides your doctor with information about the size and shape of your heart and how well your heart's chambers and valves are working. This procedure takes approximately one hour. There are no restrictions for this procedure. This will be done at our [facility] location:  [address]  Follow-Up: At [facility], you and your health needs are our priority.  As part of our continuing mission to provide you with exceptional heart care, we have created designated Provider Care Teams.  These Care Teams include your primary Cardiologist (physician) and Advanced Practice Providers (APPs -  Physician Assistants and Nurse Practitioners) who all work together to provide you with the care you need, when you need it.  We recommend signing up for the patient portal called "MyChart".  Sign up information is provided on this After Visit Summary.  MyChart is used to connect with patients for Virtual Visits (Telemedicine).  Patients are able to view lab/test results, encounter notes, upcoming appointments, etc.  Non-urgent messages can be sent to your provider as well.   To learn more about what you can do with MyChart, go to [email or web address].    Your next appointment:   6 week(s)  The format for your next  appointment:   In Person  Provider:   You may see Dr. Bjorn Pippin or one of the following Advanced Practice Providers on your designated Care Team:    Theodore Demark, PA-C  Joni Reining, DNP, ANP  Cadence Fransico Michael, PA-C

## 2020-03-29 LAB — LIPID PANEL
Chol/HDL Ratio: 4.9 ratio (ref 0.0–5.0)
Cholesterol, Total: 194 mg/dL (ref 100–199)
HDL: 40 mg/dL (ref >39)
LDL Chol Calc (NIH): 120 mg/dL — ABNORMAL HIGH (ref 0–99)
Triglycerides: 195 mg/dL — ABNORMAL HIGH (ref 0–149)
VLDL Cholesterol Cal: 34 mg/dL (ref 5–40)

## 2020-03-29 LAB — HEMOGLOBIN A1C
Est. average glucose Bld gHb Est-mCnc: 237 mg/dL
Hgb A1c MFr Bld: 9.9 % — ABNORMAL HIGH (ref 4.8–5.6)

## 2020-03-29 LAB — BASIC METABOLIC PANEL
BUN/Creatinine Ratio: 12 (ref 10–24)
BUN: 18 mg/dL (ref 8–27)
CO2: 26 mmol/L (ref 20–29)
Calcium: 9.6 mg/dL (ref 8.6–10.2)
Chloride: 102 mmol/L (ref 96–106)
Creatinine, Ser: 1.46 mg/dL — ABNORMAL HIGH (ref 0.76–1.27)
GFR calc Af Amer: 54 mL/min/{1.73_m2} — ABNORMAL LOW (ref >59)
GFR calc non Af Amer: 47 mL/min/{1.73_m2} — ABNORMAL LOW (ref >59)
Glucose: 157 mg/dL — ABNORMAL HIGH (ref 65–99)
Potassium: 4.6 mmol/L (ref 3.5–5.2)
Sodium: 140 mmol/L (ref 134–144)

## 2020-03-29 LAB — BRAIN NATRIURETIC PEPTIDE: BNP: 424.3 pg/mL — ABNORMAL HIGH (ref 0.0–100.0)

## 2020-03-31 ENCOUNTER — Other Ambulatory Visit: Payer: Self-pay | Admitting: *Deleted

## 2020-03-31 MED ORDER — ROSUVASTATIN CALCIUM 10 MG PO TABS: 10.0000 mg | ORAL_TABLET | Freq: Every day | ORAL | 3 refills | Status: DC

## 2020-04-13 ENCOUNTER — Telehealth (HOSPITAL_COMMUNITY): Payer: Self-pay | Admitting: Cardiology

## 2020-04-13 NOTE — Telephone Encounter (Signed)
Patient called and cancelled his echocardiogram and follow up with cardiologist.  Patient states that he is feeling much better and does not need the test or appointment with physician.  I informed patient that if he started to feel bad to call the office to reschedule echo and appointment with the Dr.  Magdalene Molly for echocardiogram will be removed from the WQ and if patient calls at later date to reschedule we can reinstate the order.

## 2020-04-18 ENCOUNTER — Other Ambulatory Visit (HOSPITAL_COMMUNITY): Payer: Self-pay

## 2020-05-20 ENCOUNTER — Ambulatory Visit: Payer: Self-pay | Admitting: Cardiology

## 2022-01-06 ENCOUNTER — Inpatient Hospital Stay (HOSPITAL_COMMUNITY)
Admission: EM | Admit: 2022-01-06 | Discharge: 2022-01-10 | DRG: 069 | Disposition: A | Discharge status: Home / Self Care | Payer: Medicare HMO | Attending: Student in an Organized Health Care Education/Training Program | Admitting: Student in an Organized Health Care Education/Training Program

## 2022-01-06 ENCOUNTER — Emergency Department (HOSPITAL_COMMUNITY): Payer: Medicare HMO

## 2022-01-06 ENCOUNTER — Other Ambulatory Visit: Payer: Self-pay

## 2022-01-06 ENCOUNTER — Encounter (HOSPITAL_COMMUNITY): Payer: Self-pay | Admitting: Emergency Medicine

## 2022-01-06 DIAGNOSIS — Z91148 Patient's other noncompliance with medication regimen for other reason: Secondary | ICD-10-CM

## 2022-01-06 DIAGNOSIS — N1831 Chronic kidney disease, stage 3a: Secondary | ICD-10-CM | POA: Diagnosis present

## 2022-01-06 DIAGNOSIS — I639 Cerebral infarction, unspecified: Secondary | ICD-10-CM | POA: Diagnosis not present

## 2022-01-06 DIAGNOSIS — G459 Transient cerebral ischemic attack, unspecified: Secondary | ICD-10-CM | POA: Diagnosis present

## 2022-01-06 DIAGNOSIS — I081 Rheumatic disorders of both mitral and tricuspid valves: Secondary | ICD-10-CM | POA: Diagnosis present

## 2022-01-06 DIAGNOSIS — Z8249 Family history of ischemic heart disease and other diseases of the circulatory system: Secondary | ICD-10-CM

## 2022-01-06 DIAGNOSIS — I6381 Other cerebral infarction due to occlusion or stenosis of small artery: Secondary | ICD-10-CM | POA: Diagnosis not present

## 2022-01-06 DIAGNOSIS — Z79899 Other long term (current) drug therapy: Secondary | ICD-10-CM

## 2022-01-06 DIAGNOSIS — Z7984 Long term (current) use of oral hypoglycemic drugs: Secondary | ICD-10-CM

## 2022-01-06 DIAGNOSIS — I10 Essential (primary) hypertension: Secondary | ICD-10-CM | POA: Diagnosis present

## 2022-01-06 DIAGNOSIS — E1122 Type 2 diabetes mellitus with diabetic chronic kidney disease: Secondary | ICD-10-CM | POA: Diagnosis present

## 2022-01-06 DIAGNOSIS — Z833 Family history of diabetes mellitus: Secondary | ICD-10-CM

## 2022-01-06 DIAGNOSIS — I5022 Chronic systolic (congestive) heart failure: Secondary | ICD-10-CM | POA: Diagnosis present

## 2022-01-06 DIAGNOSIS — R29700 NIHSS score 0: Secondary | ICD-10-CM | POA: Diagnosis present

## 2022-01-06 DIAGNOSIS — I5020 Unspecified systolic (congestive) heart failure: Secondary | ICD-10-CM | POA: Diagnosis present

## 2022-01-06 DIAGNOSIS — Z20822 Contact with and (suspected) exposure to covid-19: Secondary | ICD-10-CM | POA: Diagnosis present

## 2022-01-06 DIAGNOSIS — E785 Hyperlipidemia, unspecified: Secondary | ICD-10-CM | POA: Diagnosis present

## 2022-01-06 DIAGNOSIS — I420 Dilated cardiomyopathy: Secondary | ICD-10-CM | POA: Diagnosis present

## 2022-01-06 DIAGNOSIS — G8191 Hemiplegia, unspecified affecting right dominant side: Secondary | ICD-10-CM | POA: Diagnosis present

## 2022-01-06 DIAGNOSIS — N3000 Acute cystitis without hematuria: Secondary | ICD-10-CM

## 2022-01-06 DIAGNOSIS — E119 Type 2 diabetes mellitus without complications: Secondary | ICD-10-CM

## 2022-01-06 DIAGNOSIS — I130 Hypertensive heart and chronic kidney disease with heart failure and stage 1 through stage 4 chronic kidney disease, or unspecified chronic kidney disease: Secondary | ICD-10-CM | POA: Diagnosis present

## 2022-01-06 HISTORY — DX: Essential (primary) hypertension: I10

## 2022-01-06 LAB — CBC
HCT: 47.1 % (ref 39.0–52.0)
Hemoglobin: 15.5 g/dL (ref 13.0–17.0)
MCH: 29.9 pg (ref 26.0–34.0)
MCHC: 32.9 g/dL (ref 30.0–36.0)
MCV: 90.8 fL (ref 80.0–100.0)
Platelets: 177 10*3/uL (ref 150–400)
RBC: 5.19 MIL/uL (ref 4.22–5.81)
RDW: 12.7 % (ref 11.5–15.5)
WBC: 6.4 10*3/uL (ref 4.0–10.5)
nRBC: 0 % (ref 0.0–0.2)

## 2022-01-06 LAB — DIFFERENTIAL
Abs Immature Granulocytes: 0.01 10*3/uL (ref 0.00–0.07)
Basophils Absolute: 0 10*3/uL (ref 0.0–0.1)
Basophils Relative: 0 %
Eosinophils Absolute: 0 10*3/uL (ref 0.0–0.5)
Eosinophils Relative: 0 %
Immature Granulocytes: 0 %
Lymphocytes Relative: 15 %
Lymphs Abs: 0.9 10*3/uL (ref 0.7–4.0)
Monocytes Absolute: 0.4 10*3/uL (ref 0.1–1.0)
Monocytes Relative: 6 %
Neutro Abs: 4.8 10*3/uL (ref 1.7–7.7)
Neutrophils Relative %: 79 %

## 2022-01-06 LAB — PROTIME-INR
INR: 1 (ref 0.8–1.2)
Prothrombin Time: 12.7 seconds (ref 11.4–15.2)

## 2022-01-06 LAB — RAPID URINE DRUG SCREEN, HOSP PERFORMED
Amphetamines: NOT DETECTED
Barbiturates: NOT DETECTED
Benzodiazepines: NOT DETECTED
Cocaine: NOT DETECTED
Opiates: NOT DETECTED
Tetrahydrocannabinol: NOT DETECTED

## 2022-01-06 LAB — CBG MONITORING, ED: Glucose-Capillary: 167 mg/dL — ABNORMAL HIGH (ref 70–99)

## 2022-01-06 LAB — URINALYSIS, ROUTINE W REFLEX MICROSCOPIC
Bilirubin Urine: NEGATIVE
Glucose, UA: NEGATIVE mg/dL
Ketones, ur: NEGATIVE mg/dL
Nitrite: POSITIVE — AB
Protein, ur: 100 mg/dL — AB
Specific Gravity, Urine: 1.021 (ref 1.005–1.030)
WBC, UA: >50 WBC/hpf — ABNORMAL HIGH (ref 0–5)
pH: 5 (ref 5.0–8.0)

## 2022-01-06 LAB — BASIC METABOLIC PANEL
Anion gap: 8 (ref 5–15)
BUN: 16 mg/dL (ref 8–23)
CO2: 25 mmol/L (ref 22–32)
Calcium: 8.9 mg/dL (ref 8.9–10.3)
Chloride: 107 mmol/L (ref 98–111)
Creatinine, Ser: 1.51 mg/dL — ABNORMAL HIGH (ref 0.61–1.24)
GFR, Estimated: 48 mL/min — ABNORMAL LOW (ref >60)
Glucose, Bld: 175 mg/dL — ABNORMAL HIGH (ref 70–99)
Potassium: 3.3 mmol/L — ABNORMAL LOW (ref 3.5–5.1)
Sodium: 140 mmol/L (ref 135–145)

## 2022-01-06 LAB — RESP PANEL BY RT-PCR (FLU A&B, COVID) ARPGX2
Influenza A by PCR: NEGATIVE
Influenza B by PCR: NEGATIVE
SARS Coronavirus 2 by RT PCR: NEGATIVE

## 2022-01-06 LAB — HEPATIC FUNCTION PANEL
ALT: 19 U/L (ref 0–44)
AST: 20 U/L (ref 15–41)
Albumin: 3.6 g/dL (ref 3.5–5.0)
Alkaline Phosphatase: 67 U/L (ref 38–126)
Bilirubin, Direct: 0.2 mg/dL (ref 0.0–0.2)
Indirect Bilirubin: 0.7 mg/dL (ref 0.3–0.9)
Total Bilirubin: 0.9 mg/dL (ref 0.3–1.2)
Total Protein: 7.6 g/dL (ref 6.5–8.1)

## 2022-01-06 LAB — APTT: aPTT: 25 seconds (ref 24–36)

## 2022-01-06 LAB — ETHANOL: Alcohol, Ethyl (B): <10 mg/dL (ref <10)

## 2022-01-06 IMAGING — MR MR HEAD W/O CM
6 of 10 series · 29 of 48 positions shown · non-contrast
Comparison: Head CT [DATE]

CLINICAL DATA: Provided history: Neuro deficit, acute, stroke
suspected.

EXAM:
MRI HEAD WITHOUT CONTRAST
TECHNIQUE: Multiplanar, multiecho pulse sequences of the brain and surrounding
structures were obtained without intravenous contrast.

[Series 2: DWI · axial · 3.0mm · 0.94mm/px · z∈[-44,+103]mm · 8 of 98 slices shown (1 of 2)]
[im 1/98]
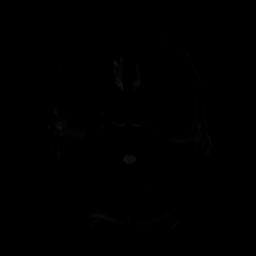
[im 11/98]
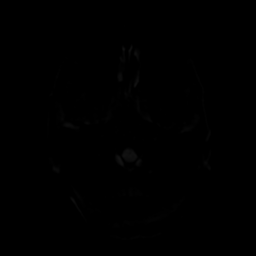
[im 33/98]
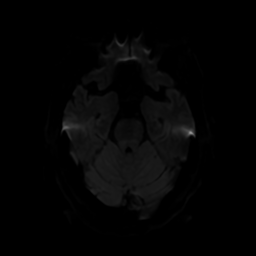
[im 44/98]
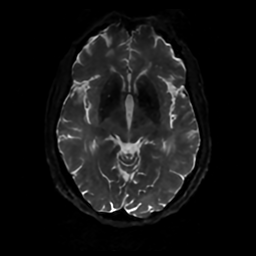
[im 54/98]
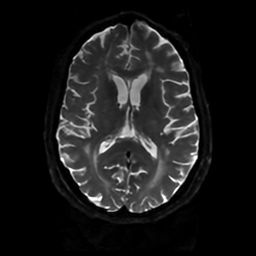
[im 65/98]
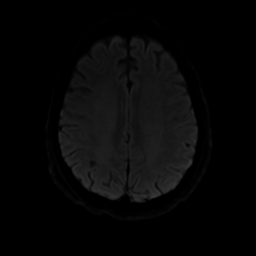
[im 87/98]
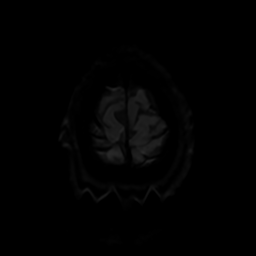
[im 98/98]
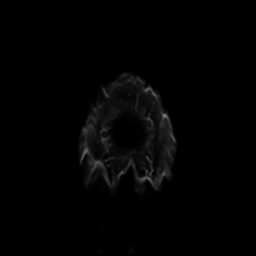

[Series 3: DWI · coronal · 4.0mm · 0.94mm/px · 8 of 74 slices shown (2 of 2)]
[im 1/74]
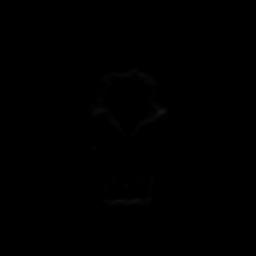
[im 11/74]
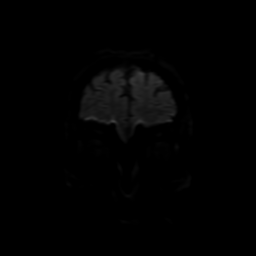
[im 21/74]
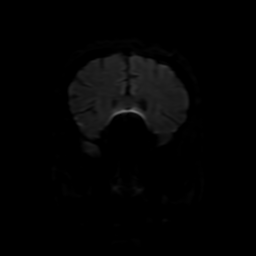
[im 32/74]
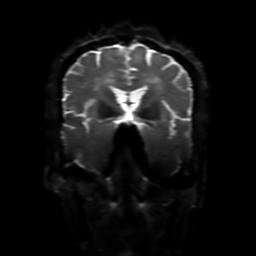
[im 42/74]
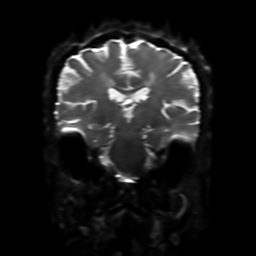
[im 53/74]
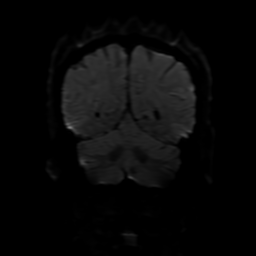
[im 63/74]
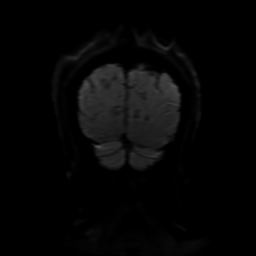
[im 74/74]
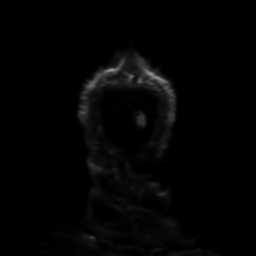

[Series 4: FLAIR · sagittal · 5.0mm · 0.23mm/px · 2 of 25 slices shown (1 of 2)]
[im 1/25]
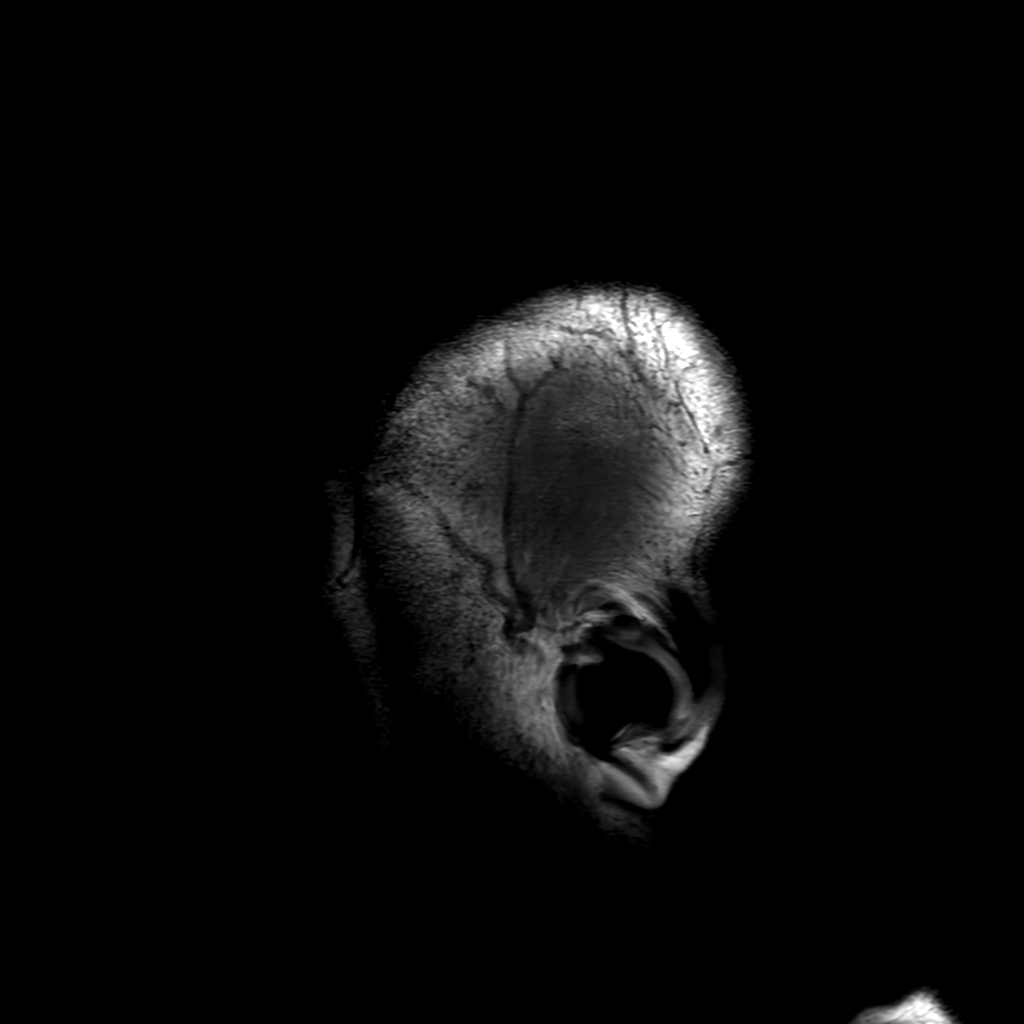
[im 25/25]
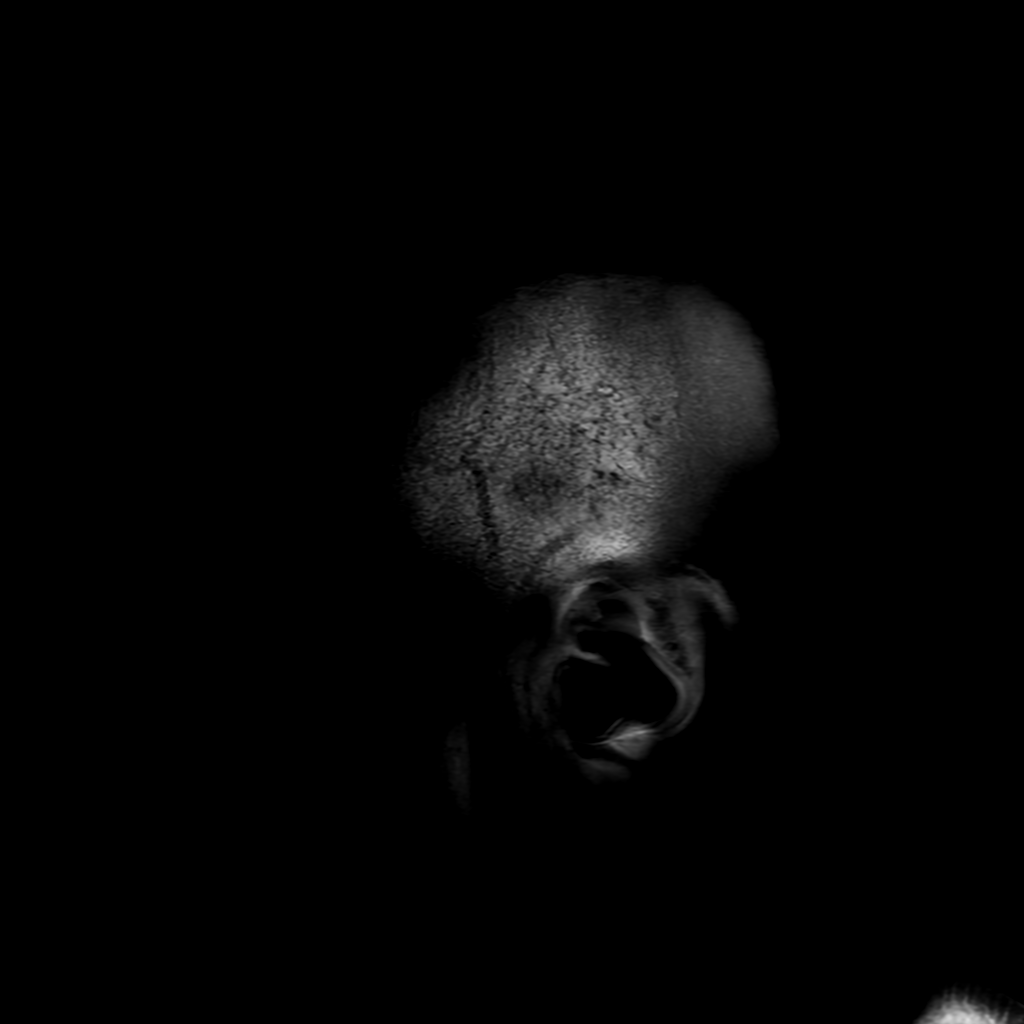

[Series 6: FLAIR · axial · 4.0mm · 0.45mm/px · z∈[-63,+84]mm · 3 of 35 slices shown (2 of 2)]
[im 1/35]
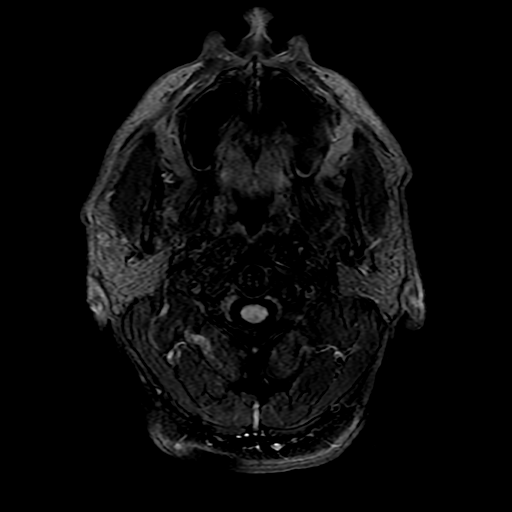
[im 18/35]
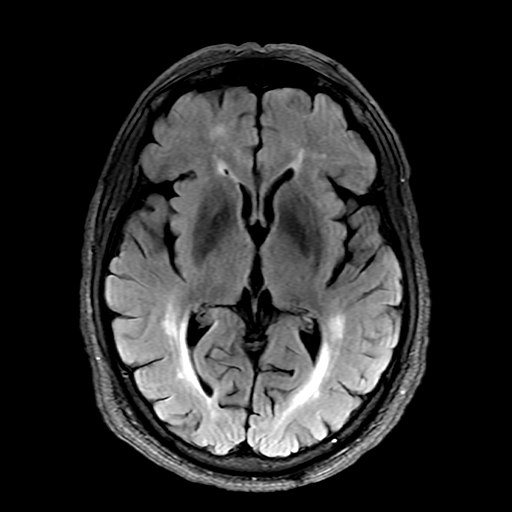
[im 35/35]
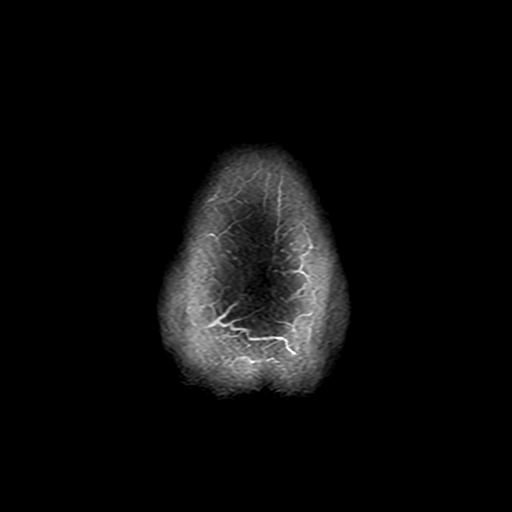

[Series 250: ADC · axial · 3.0mm · 0.94mm/px · z∈[-44,+103]mm · 5 of 50 slices shown (1 of 2)]
[im 1/50]
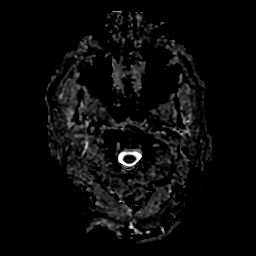
[im 13/50]
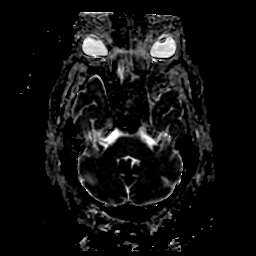
[im 25/50]
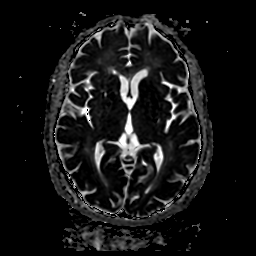
[im 37/50]
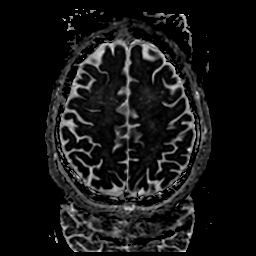
[im 50/50]
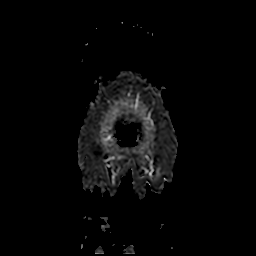

[Series 350: ADC · coronal · 4.0mm · 0.94mm/px · 3 of 36 slices shown (2 of 2)]
[im 1/36]
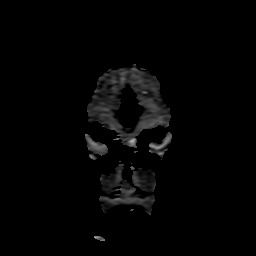
[im 18/36]
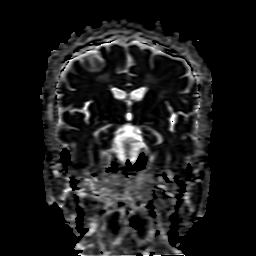
[im 36/36]
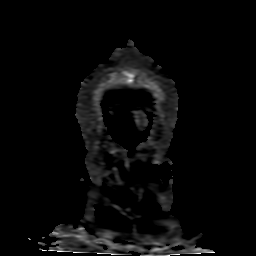

[29 of 48 positions shown; findings below may reference images not displayed]

FINDINGS: Brain:

No age advanced or lobar predominant parenchymal atrophy.

Moderate to moderately advanced multifocal T2 FLAIR hyperintense
signal abnormality within the cerebral white matter, nonspecific but
compatible with chronic small vessel ischemic disease.

Subcentimeter chronic infarct within the left cerebellar hemisphere.

A punctate acute infarct is questioned within the left
frontoparietal periventricular white matter, versus artifact (series
2, image 30).

6 mm focus of susceptibility weighted signal loss within the right
parietal lobe, which may reflect a small chronic cortical infarct
with associated chronic hemosiderin deposition, a chronic
microhemorrhage or a cavernoma (series 7, image 66).

No evidence of an intracranial mass

No extra-axial fluid collection.

No midline shift.

Vascular: Maintained flow voids within the proximal large arterial
vessels.

Skull and upper cervical spine: No focal suspicious marrow lesion.

Sinuses/Orbits: Visualized orbits show no acute finding. Trace
mucosal thickening within the bilateral ethmoid and maxillary
sinuses.
IMPRESSION: Punctate acute infarct versus artifact within the left
frontoparietal periventricular white matter.

Otherwise, no evidence of acute intracranial abnormality.

6 mm focus of susceptibility-weighted signal loss within the right
parietal lobe, which may reflect a small chronic cortical infarct
with associated chronic hemosiderin deposition, a chronic
microhemorrhage or a cavernoma.

Moderate to moderately advanced chronic small vessel ischemic
changes within the cerebral white matter.

Subcentimeter chronic infarct within the left cerebellar hemisphere.

## 2022-01-06 IMAGING — CT CT ANGIO HEAD-NECK (W OR W/O PERF)
2 of 7 series · 8 of 33 positions shown · IV contrast (OMNI 350)
Comparison: Brain MRI [DATE].  Head CT [DATE].

CLINICAL DATA: Provided history: Stroke.  Hypertension, stroke.

EXAM:
CT ANGIOGRAPHY HEAD AND NECK
TECHNIQUE: Multidetector CT imaging of the head and neck was performed using
the standard protocol during bolus administration of intravenous
contrast. Multiplanar CT image reconstructions and MIPs were
obtained to evaluate the vascular anatomy. Carotid stenosis
measurements (when applicable) are obtained utilizing NASCET
criteria, using the distal internal carotid diameter as the
denominator.

[Series 5: cta neck · axial · 0.49mm/px · z∈[-323,-197]mm · 2 of 190 slices shown]
[im 64/190  soft-tissue]
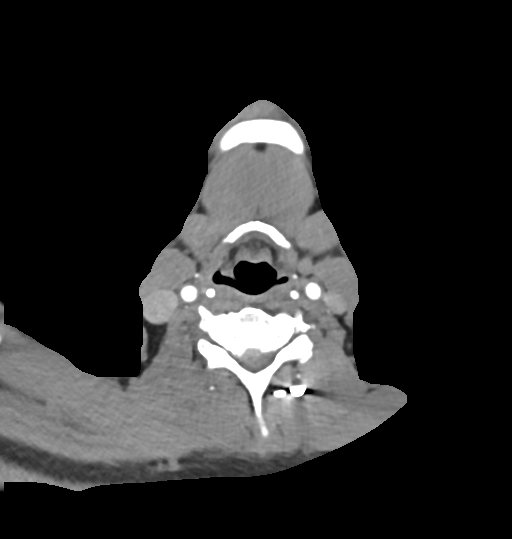
[im 127/190  soft-tissue]
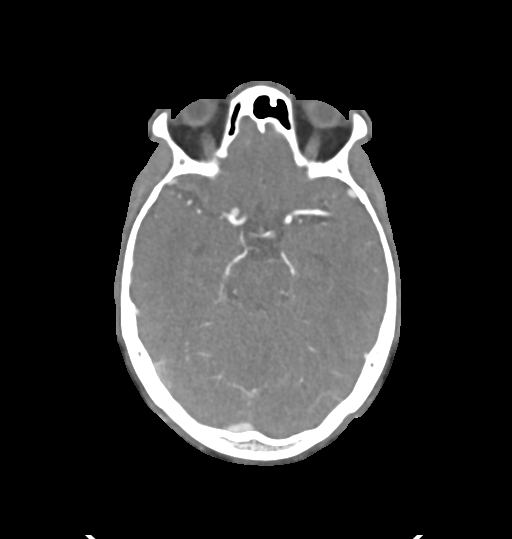

[Series 7: cta neck axial · axial · 0.39mm/px · z∈[-398,-128]mm · 6 of 378 slices shown]
[im 54/378  soft-tissue]
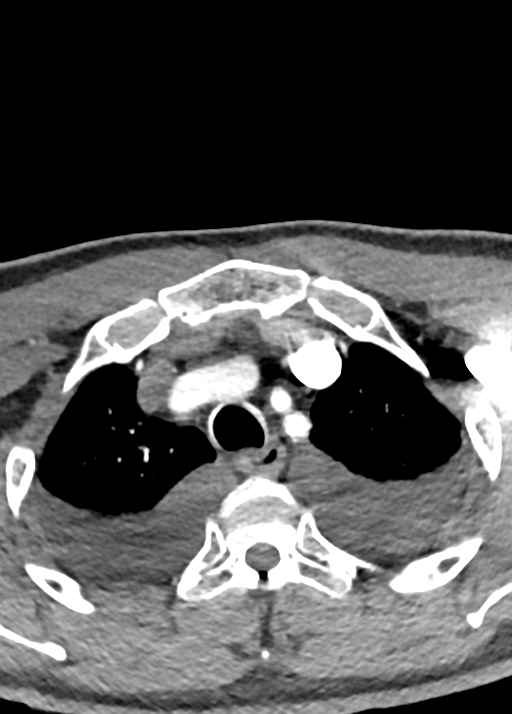
[im 108/378  bone]
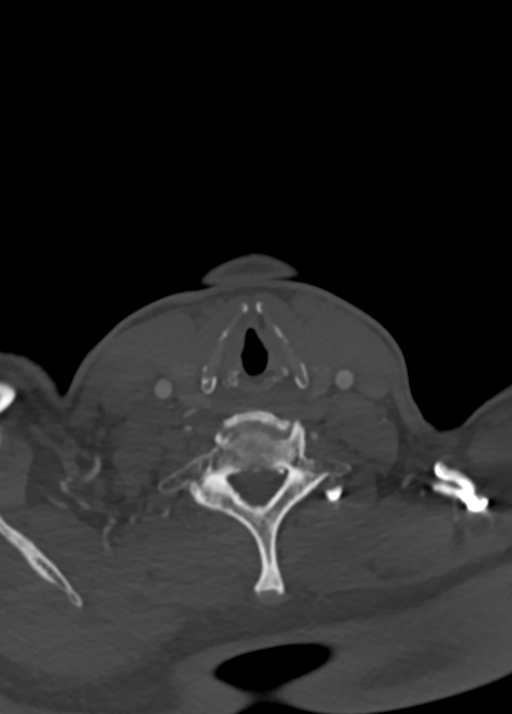
[im 162/378  soft-tissue]
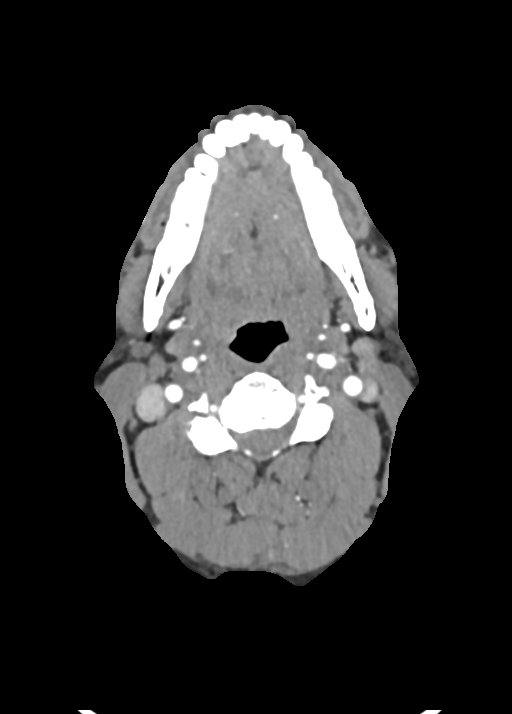
[im 216/378  bone]
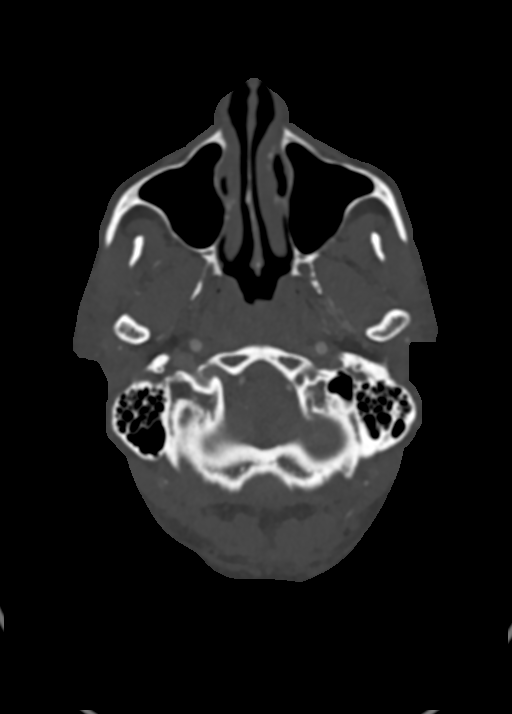
[im 270/378  soft-tissue]
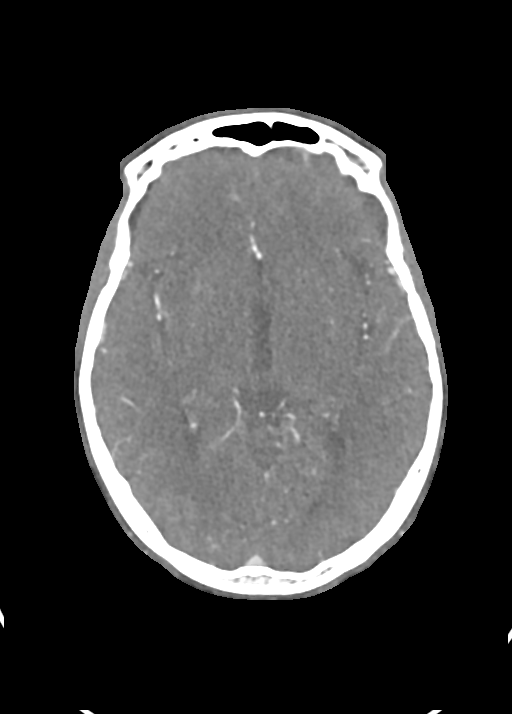
[im 324/378  bone]
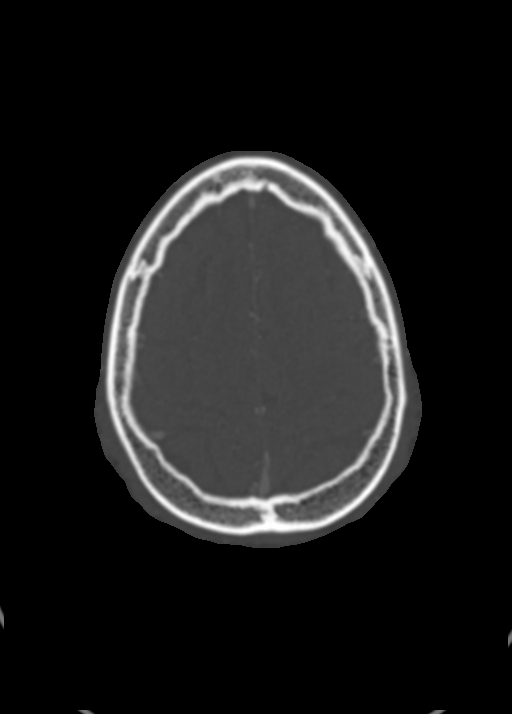

[8 of 33 positions shown; findings below may reference images not displayed]

RADIATION DOSE REDUCTION: This exam was performed according to the
departmental dose-optimization program which includes automated
exposure control, adjustment of the mA and/or kV according to
patient size and/or use of iterative reconstruction technique.

CONTRAST:  100mL OMNIPAQUE IOHEXOL 350 MG/ML SOLN
FINDINGS: CTA NECK FINDINGS

Aortic arch: Standard aortic branching. The visualized aortic arch
is normal in caliber. Streak and beam hardening artifact arising
from a dense left-sided contrast bolus partially obscures the left
subclavian artery. Within this limitation, there is no appreciable
hemodynamically significant innominate or proximal subclavian artery
stenosis.

Right carotid system: CCA and ICA patent within the neck without
stenosis. Minimal soft plaque about the carotid bifurcation and
within the proximal ICA.

Left carotid system: CCA and ICA patent within the neck without
stenosis or significant atherosclerotic disease.

Vertebral arteries: The arteries patent within the neck. Streak and
beam hardening artifact arising from a dense left-sided contrast
bolus limits evaluation of the proximal V1 left vertebral artery. No
appreciable significant stenosis within the cervical vertebral
arteries.

Skeleton: Cervical spondylosis. No acute bony abnormality or
aggressive osseous lesion.

Other neck: Subcentimeter nodule within the right thyroid lobe, not
meeting consensus criteria for ultrasound follow-up based on size. 8
mm enhancing nodule within the right parotid gland.

Upper chest: Incompletely imaged bilateral pleural effusions.
Ground-glass opacity within the imaged lung apices, likely
reflecting edema.

Review of the MIP images confirms the above findings

CTA HEAD FINDINGS

Anterior circulation:

The intracranial internal carotid arteries are patent. The M1 middle
cerebral arteries are patent. Atherosclerotic irregularity of the M2
and more distal MCA vessels, bilaterally. Most notably, there is a
moderate/severe focal stenosis within a proximal to mid M2 left MCA
vessel (series 12, image 29). The anterior cerebral arteries are
patent. No intracranial aneurysm is identified.

Posterior circulation:

The intracranial vertebral arteries are patent. The basilar artery
is patent. The posterior cerebral arteries are patent. The basilar
artery is developmentally diminutive. The bilateral P1 segments are
also developmentally diminutive and there are sizable bilateral
posterior communicating arteries. Atherosclerotic irregularity of
both posterior cerebral arteries. Most notably, there is a severe
focal stenosis within a left PCA branch at the P2/P3 junction
(series 12, image 25).

Venous sinuses: Within the limitations of contrast timing, no
convincing thrombus.

Anatomic variants: As described.

Review of the MIP images confirms the above findings
IMPRESSION: CTA neck:

1. The common carotid, internal carotid and vertebral arteries are
patent within the neck without stenosis. Minimal atherosclerotic
plaque about the right carotid bifurcation and within the proximal
right ICA.
2. 8 mm enhancing nodule within the right parotid gland, which may
reflect a nonspecific enlarged intraparotid lymph node or a primary
parotid neoplasm.
3. Edema within the imaged lung apices.
4. Incompletely imaged bilateral pleural effusions.

CTA head:

1. No intracranial large vessel occlusion.
2. Intracranial atherosclerotic disease with multifocal stenoses,
most notably as follows.
3. Moderate/severe focal stenosis within a proximal-to-mid M2 left
MCA vessel.
4. Severe focal stenosis within a left PCA branch at the P2/P3
junction.

## 2022-01-06 IMAGING — CT CT HEAD W/O CM
4 series · 16 of 47 positions shown, 18 images · non-contrast
Comparison: None.

CLINICAL DATA: 75-year-old male with fall and altered sensations.



[Series 2: head without · axial · non-contrast · 0.42mm/px · z∈[+1314,+1428]mm · 7 of 31 slices shown, 9 images]
[im 4/31  brain]
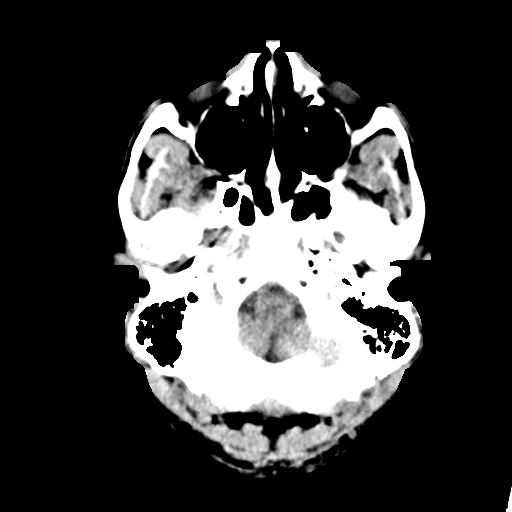
[im 4/31  bone]
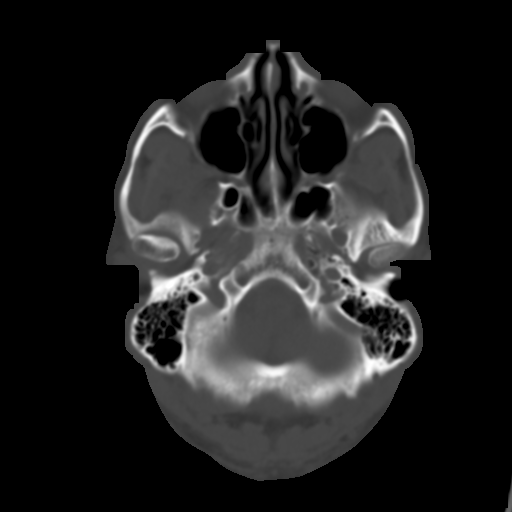
[im 8/31  brain]
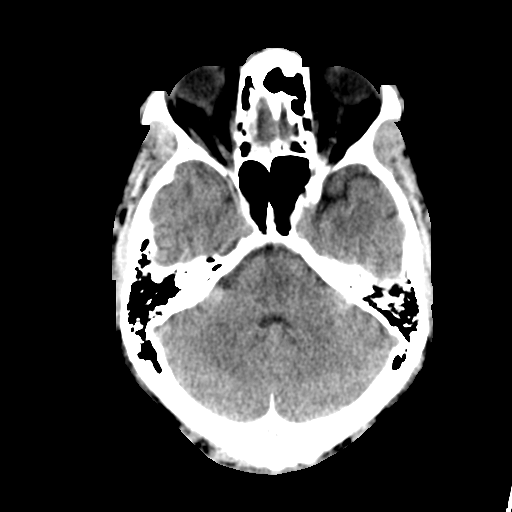
[im 12/31  brain]
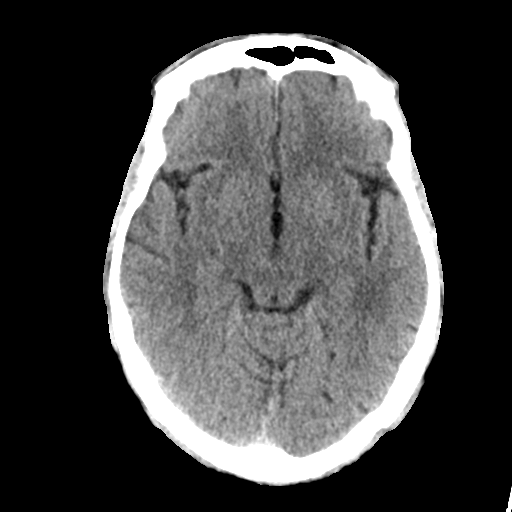
[im 16/31  brain]
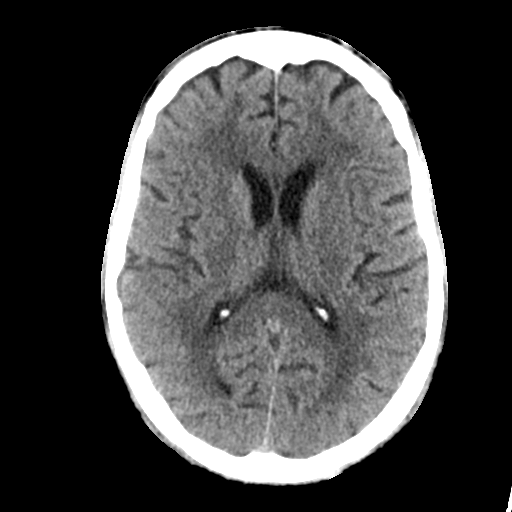
[im 19/31  brain]
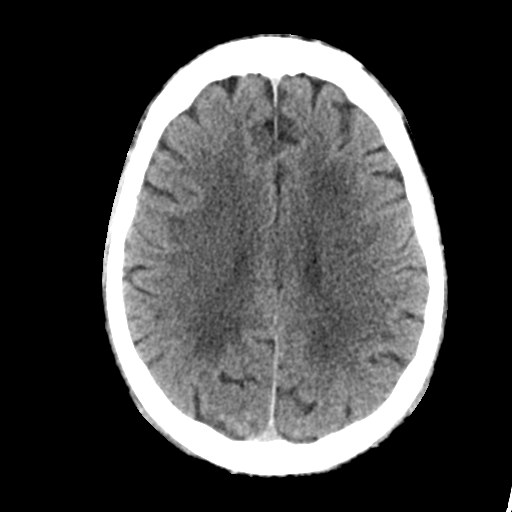
[im 19/31  bone]
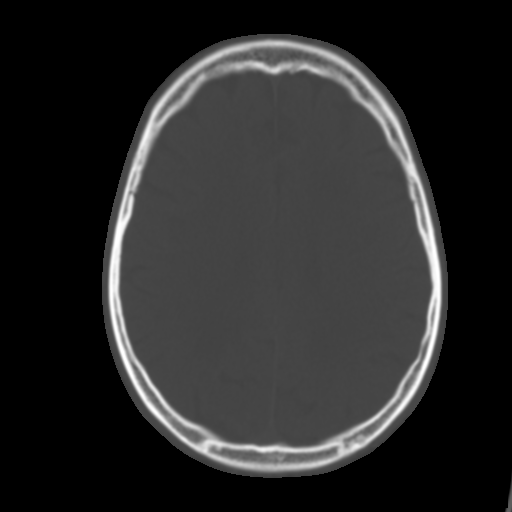
[im 23/31  brain]
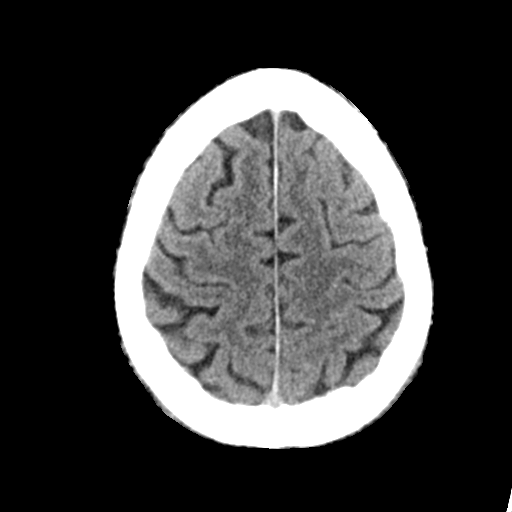
[im 27/31  brain]
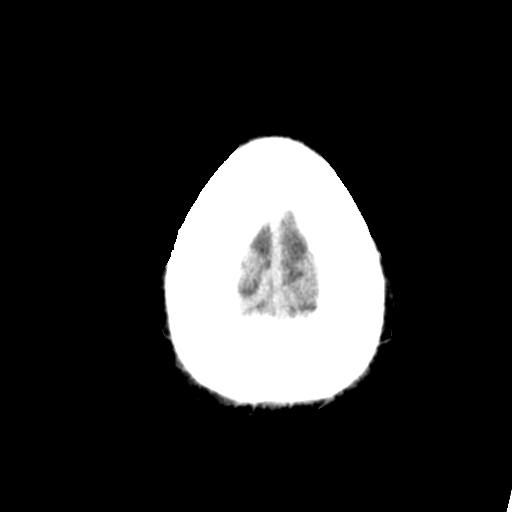

[Series 3: head bone · axial · 0.42mm/px · z∈[+1312,+1342]mm · 3 of 77 slices shown]
[im 8/77  bone]
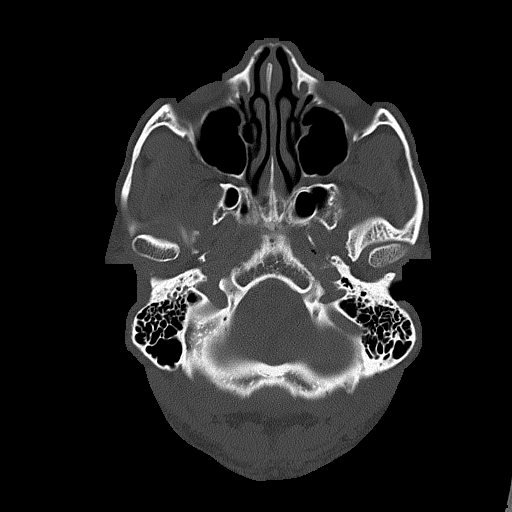
[im 16/77  bone]
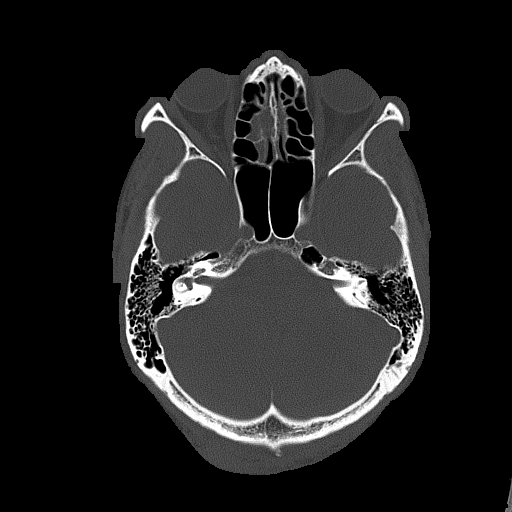
[im 23/77  bone]
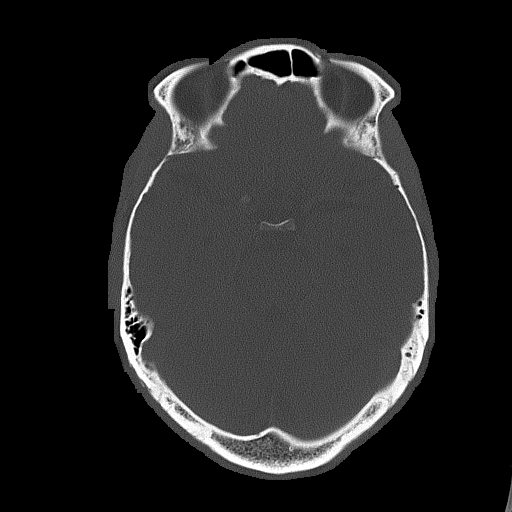

[Series 4: head without cor · coronal · non-contrast · 0.30mm/px · 3 of 70 slices shown]
[im 24/70  brain]
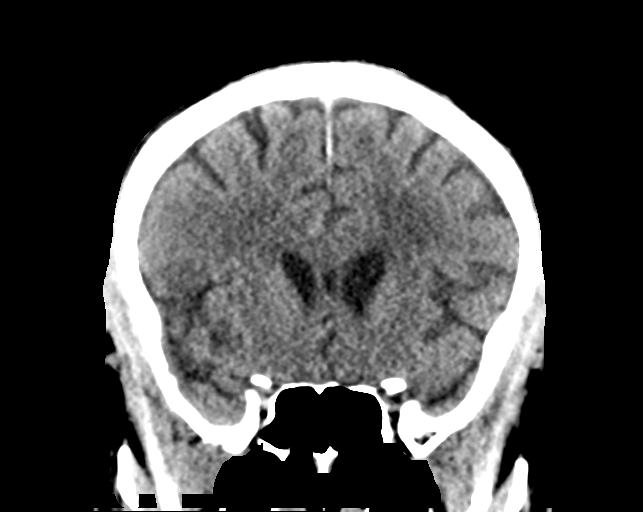
[im 31/70  brain]
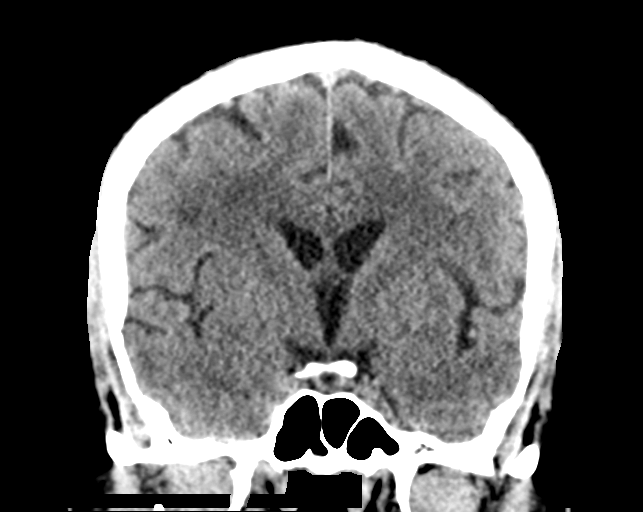
[im 39/70  brain]
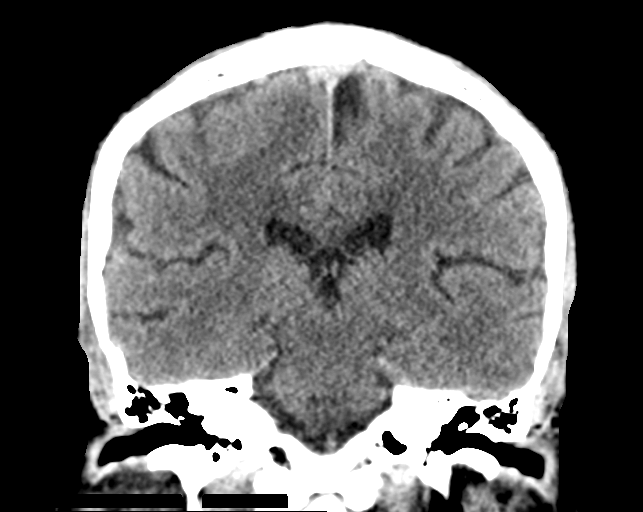

[Series 5: head without sag · sagittal · non-contrast · 0.30mm/px · 3 of 65 slices shown]
[im 22/65  brain]
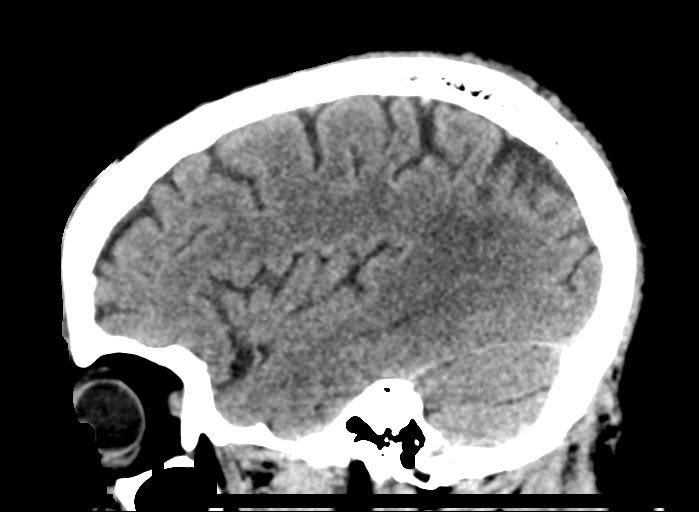
[im 33/65  brain]
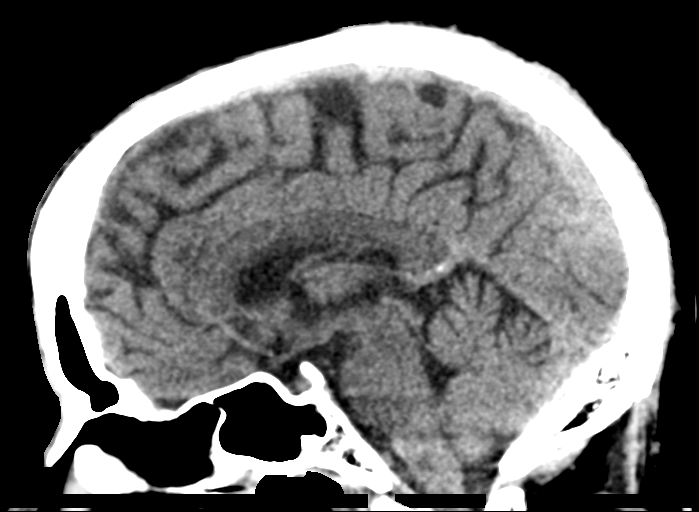
[im 43/65  brain]
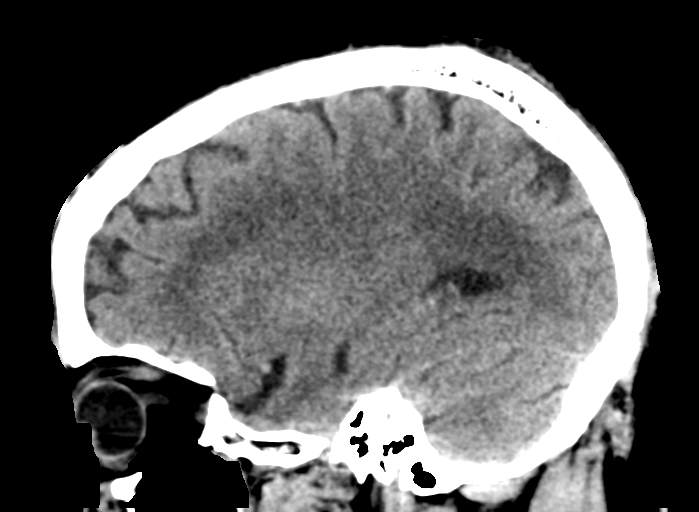

[16 of 47 positions shown; findings below may reference images not displayed]

FINDINGS: Brain: No evidence of acute infarction, hemorrhage, hydrocephalus,
extra-axial collection or mass lesion/mass effect.

Moderate bilateral white matter hypodensities are nonspecific but
likely represent chronic small-vessel white matter ischemic changes.

Vascular: No hyperdense vessel or unexpected calcification.

Skull: Normal. Negative for fracture or focal lesion.

Sinuses/Orbits: No acute finding.

Other: None.
IMPRESSION: 1. No evidence of acute intracranial abnormality.
2. Moderate bilateral white matter hypodensities, nonspecific but
likely represent chronic small-vessel white matter ischemic changes.

## 2022-01-06 MED ORDER — CLOPIDOGREL BISULFATE 75 MG PO TABS
75.0000 mg | ORAL_TABLET | Freq: Every day | ORAL | Status: DC
  Administered 2022-01-06 – 2022-01-07 (×2): 75 mg via ORAL
  Filled 2022-01-06 (×2): qty 1

## 2022-01-06 MED ORDER — ENOXAPARIN SODIUM 30 MG/0.3ML IJ SOSY
30.0000 mg | PREFILLED_SYRINGE | INTRAMUSCULAR | Status: DC
  Administered 2022-01-06 – 2022-01-07 (×2): 30 mg via SUBCUTANEOUS
  Filled 2022-01-06 (×2): qty 0.3

## 2022-01-06 MED ORDER — STROKE: EARLY STAGES OF RECOVERY BOOK
Freq: Once | Status: AC
  Administered 2022-01-07: 1
  Filled 2022-01-06: qty 1

## 2022-01-06 MED ORDER — LABETALOL HCL 5 MG/ML IV SOLN
20.0000 mg | Freq: Once | INTRAVENOUS | Status: AC
  Administered 2022-01-06: 20 mg via INTRAVENOUS
  Filled 2022-01-06: qty 4

## 2022-01-06 MED ORDER — ASPIRIN EC 81 MG PO TBEC
81.0000 mg | DELAYED_RELEASE_TABLET | Freq: Every day | ORAL | Status: DC
  Administered 2022-01-06 – 2022-01-07 (×2): 81 mg via ORAL
  Filled 2022-01-06 (×2): qty 1

## 2022-01-06 MED ORDER — LABETALOL HCL 5 MG/ML IV SOLN
5.0000 mg | INTRAVENOUS | Status: DC | PRN
  Administered 2022-01-07: 5 mg via INTRAVENOUS
  Filled 2022-01-06 (×2): qty 4

## 2022-01-06 MED ORDER — SODIUM CHLORIDE 0.9 % IV SOLN
2.0000 g | Freq: Once | INTRAVENOUS | Status: AC
  Administered 2022-01-06: 2 g via INTRAVENOUS
  Filled 2022-01-06: qty 20

## 2022-01-06 MED ORDER — INSULIN ASPART 100 UNIT/ML IJ SOLN
0.0000 [IU] | Freq: Three times a day (TID) | INTRAMUSCULAR | Status: DC
  Administered 2022-01-07 – 2022-01-09 (×3): 1 [IU] via SUBCUTANEOUS

## 2022-01-06 MED ORDER — IOHEXOL 350 MG/ML SOLN
100.0000 mL | Freq: Once | INTRAVENOUS | Status: AC | PRN
  Administered 2022-01-06: 100 mL via INTRAVENOUS

## 2022-01-06 MED ORDER — SODIUM CHLORIDE 0.9% FLUSH
3.0000 mL | Freq: Once | INTRAVENOUS | Status: AC
  Administered 2022-01-06: 3 mL via INTRAVENOUS

## 2022-01-06 MED ORDER — POTASSIUM CHLORIDE 10 MEQ/100ML IV SOLN
10.0000 meq | INTRAVENOUS | Status: AC
  Administered 2022-01-06 – 2022-01-07 (×4): 10 meq via INTRAVENOUS
  Filled 2022-01-06 (×4): qty 100

## 2022-01-06 MED ORDER — ATORVASTATIN CALCIUM 80 MG PO TABS
80.0000 mg | ORAL_TABLET | Freq: Every evening | ORAL | Status: DC
  Administered 2022-01-06 – 2022-01-09 (×4): 80 mg via ORAL
  Filled 2022-01-06: qty 1
  Filled 2022-01-06: qty 2
  Filled 2022-01-06 (×2): qty 1
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
## 2022-01-06 NOTE — ED Notes (Signed)
MRI ?  ?Joseph Munch, MD ?01/06/22 1652 ? ?

## 2022-01-06 NOTE — ED Triage Notes (Signed)
Pt to triage via [facility] from home.  Reports he "fell over" to the left while going to the bathroom this morning.  No weakness or numbness.  States he feels "funny".  EMS BP 213/133.  Not taking HTN meds for 3 months because he reports BP had been ok. ?

## 2022-01-06 NOTE — ED Notes (Signed)
Per provider: goal will be to keep BP under 220/120, but above 180 for the next 48 hours ?

## 2022-01-06 NOTE — ED Provider Triage Note (Signed)
Emergency Medicine Provider Triage Evaluation Note ? ?Joseph Patrick , a 76 y.o. male  was evaluated in triage.  Pt complains of hypertension.  Patient states that this morning he felt "funny" and went to the bathroom.  Patient states that when he went to the bathroom, he fell over to his left side for an unknown reason.  Patient states that he seen at [facility], called in this morning and was advised to report to ED.  Patient states that he has not taken blood pressure medication for 3 months.  Patient states that his blood pressure was controlled, so he stopped taking the medication.  Patient denies all symptoms. ? ?Review of Systems  ?Positive: Denies ?Negative: Chest pain, shortness of breath, nausea, vomiting, diarrhea, blurred vision, headache ? ?Physical Exam  ?BP (!) 247/110   Pulse 99   Temp 98.4 ?F (36.9 ?C) (Oral)   Resp 16   SpO2 100%  ?Gen:   Awake, no distress   ?Resp:  Normal effort  ?MSK:   Moves extremities without difficulty  ?Other:  No focal neurodeficits on examination ? ?Medical Decision Making  ?Medically screening exam initiated at 8:12 AM.  Appropriate orders placed.  Joseph Patrick was informed that the remainder of the evaluation will be completed by another provider, this initial triage assessment does not replace that evaluation, and the importance of remaining in the ED until their evaluation is complete. ? ? ?  ?Azucena Cecil, PA-C ?01/06/22 0813 ? ?

## 2022-01-06 NOTE — ED Notes (Signed)
Received verbal report from Louie C RN at this time ?

## 2022-01-06 NOTE — Consult Note (Addendum)
Neurology Consultation ? ?Reason for Consult: Stroke on MRI  ?Referring Physician: Dr Venita Sheffield  ? ?CC: hypertensive and imbalance ? ?History is obtained from:patient and medical chart  ? ?HPI: Joseph Patrick is a 76 y.o. male with past medical history of HTN and DM (stopped meds on own ~3 months ago) who presents to the ED via EMS for feeling funny and high blood pressure. He awoke @ 0700 with no symptoms. He also states that he had right foot numbness and imbalance this am when attempting to get up off the couch ~ 0830. BP in ED 247/110. He states his numbness in right lower leg has resolved. Denies headache, weakness, slurred speech or facial droop. MRI revealed punctate infarct in the left hemisphere. Neurology consulted for stroke workup ?Reports being on antihypertensives and diabetes medications which she took himself off of because all his numbers look good to him. ?He has not been taking any medications for the past 5 months. ?Currently retired.  Used to work in [employer]. ?No smoking, drug or alcohol abuse ? ?LKW: 0830 ?tpa given?: no, outside window ?Premorbid modified Rankin scale (mRS):  ?0-Completely asymptomatic and back to baseline post-stroke ? ?ROS: Full ROS was performed and is negative except as noted in the HPI.  ? ?Past Medical History:  ?Diagnosis Date  ? Hypertension   ? ?Hypertension Emergency (SBP > 180 or DBP > 120 & end organ damage) and Type 2 diabetes mellitus w/o complications  ? ?Family History  ?Problem Relation Age of Onset  ? Heart failure Sister   ? ? ? ?Social History:  ? reports that he has never smoked. He has never used smokeless tobacco. He reports that he does not drink alcohol and does not use drugs. ? ?Medications ?No current facility-administered medications for this encounter. ? ?Current Outpatient Medications:  ?  amLODipine (NORVASC) 5 MG tablet, Take 1 tablet (5 mg total) by mouth daily., Disp: 90 tablet, Rfl: 3 ?  furosemide (LASIX) 20 MG tablet, Take 1-2 tablets  daily for fluid in your lungs., Disp: 30 tablet, Rfl: 0 ?  metFORMIN (GLUCOPHAGE) 500 MG tablet, Take 1 tablet (500 mg total) by mouth 2 (two) times daily with a meal., Disp: 60 tablet, Rfl: 0 ?  rosuvastatin (CRESTOR) 10 MG tablet, Take 1 tablet (10 mg total) by mouth daily., Disp: 90 tablet, Rfl: 3 ? ? ?Exam: ?Current vital signs: ?BP (!) 209/120   Pulse 92   Temp 98.4 ?F (36.9 ?C) (Oral)   Resp 19   SpO2 97%  ?Vital signs in last 24 hours: ?Temp:  [98.4 ?F (36.9 ?C)] 98.4 ?F (36.9 ?C) (04/22 0755) ?Pulse Rate:  [81-122] 92 (04/22 1545) ?Resp:  [16-24] 19 (04/22 1545) ?BP: (194-260)/(109-176) 209/120 (04/22 1545) ?SpO2:  [92 %-100 %] 97 % (04/22 1545) ? ?GENERAL: Awake, alert in NAD ?HEENT: - Normocephalic and atraumatic, dry mm ?LUNGS - Clear to auscultation bilaterally with no wheezes ?CV - S1S2 RRR, no m/r/g, equal pulses bilaterally. ?ABDOMEN - Soft, nontender, nondistended with normoactive BS ?Ext: warm, well perfused, intact peripheral pulses, no  edema ? ?NEURO:  ?Mental Status: AA&Ox3  ?Language: speech is clear.  Naming, repetition, fluency, and comprehension intact. ?Cranial Nerves: PERRL 66mm/brisk. EOMI, visual fields full, no facial asymmetry, facial sensation intact, hearing intact, tongue/uvula/soft palate midline, normal sternocleidomastoid and trapezius muscle strength. No evidence of tongue atrophy or fibrillations ?Motor: 5/5 in all 4 extremities ?Tone: is normal and bulk is normal ?Sensation- Intact to light touch bilaterally ?Coordination: FTN intact bilaterally,  no ataxia in BLE. ?Gait- deferred ? ?NIHSS--0 ? ?Imaging ?I have reviewed the images obtained: ? ?CT-head 4/22: ?1. No evidence of acute intracranial abnormality. ?2. Moderate bilateral white matter hypodensities, nonspecific but ?likely represent chronic small-vessel white matter ischemic changes ? ?MRI examination of the brain 4/22: ?-Punctate acute infarct versus artifact within the left frontoparietal periventricular white  matter. ?-6 mm focus of susceptibility-weighted signal loss within the right ?parietal lobe, which may reflect a small chronic cortical infarct ?with associated chronic hemosiderin deposition, a chronic ?microhemorrhage or a cavernoma. ?-Moderate to moderately advanced chronic small vessel ischemic ?changes within the cerebral white matter. ?-Subcentimeter chronic infarct within the left cerebellar hemisphere. ? ?Assessment:  ?Joseph Patrick is a 76 y.o. male with past medical history of HTN and DM (stopped meds on own ~3 months ago) who presents to the ED via EMS for feeling funny and high blood pressure. He awoke @ 0700 with no symptoms. He also states that he had right foot numbness and imbalance this am when attempting to get up off the couch ~ 0830. BP in ED 247/110. He states his numbness in right lower leg has resolved ? 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 ?Recommendations: ?-admit to medicine  ?- BP management- allow for permissive hypertension SBP<220, for next 24 hrs and then start normalizing BP ?- HgbA1c, fasting lipid panel  ?- bedside swallow screen  ?- Frequent neuro checks ?- Echocardiogram ?- CTA head and neck ?- Prophylactic therapy-Antiplatelet med: Aspirin - dose 81mg  and plavix 75mg  daily. ( Duration will depend on results of CTA) ?- Start Lipitor 80mg  daily  ?- Risk factor modification ?- Telemetry monitoring ?- PT consult, OT consult, Speech consult ?- Stroke team to follow  ? ?Beulah Gandy DNP, ACNPC-AG ? ?Attending Neurohospitalist Addendum ?Patient seen and examined with APP/Resident. ?Agree with the history and physical as documented above. ?Agree with the plan as documented, which I helped formulate. ?I have independently reviewed the chart, obtained history, review of systems and examined the patient.I have personally reviewed pertinent head/neck/spine imaging (CT/MRI). ?Imaging reviewed personally-very subtle area of hyperintensity on the DWI in  the periventricular area on the left frontoparietal region with correspondingly hypointense ADC map.  Likely small vessel stroke.  Work-up as above. ?Please feel free to call with any questions. ? ?-- ?Amie Portland, MD ?Neurologist ?[facility] ?Pager: [phone] ? ? ?

## 2022-01-06 NOTE — ED Provider Notes (Signed)
?[facility] EMERGENCY DEPARTMENT ?Provider Note ? ? ?CSN: [id number] ?Arrival date & time: 01/06/22  0277 ? ?  ? ?History ? ?Chief Complaint  ?Patient presents with  ? Hypertension  ? ? ?Josephus Harriger is a 76 y.o. male. ? ?Patient went to bed at midnight felt fine at that time.  Got up this morning noted almost instantaneously that something felt unusual about his is right lower extremity.  Had some numbness in the thigh and it did not seem to be quite working right.  Patient also had some dizziness no room spinning so no true vertigo.  Patient has a history of hypertension and diabetes.  Patient has been off both his metformin and his hypertensive meds hypertensive meds are listed as Lasix and Norvasc.  Because he said his blood pressures have been controlled.  But here today they are quite elevated.  Patient does have a primary care doctor.  Patient thinks that all the right lower extremity symptoms have resolved but he still has some of the dizziness. ? ? ?  ? ?Home Medications ?Prior to Admission medications   ?Medication Sig Start Date End Date Taking? Authorizing Provider  ?amLODipine (NORVASC) 5 MG tablet Take 1 tablet (5 mg total) by mouth daily. 03/28/20   Little Ishikawa, MD  ?furosemide (LASIX) 20 MG tablet Take 1-2 tablets daily for fluid in your lungs. 02/22/20   Mardella Layman, MD  ?metFORMIN (GLUCOPHAGE) 500 MG tablet Take 1 tablet (500 mg total) by mouth 2 (two) times daily with a meal. 02/22/20   Mardella Layman, MD  ?rosuvastatin (CRESTOR) 10 MG tablet Take 1 tablet (10 mg total) by mouth daily. 03/31/20 06/29/20  Little Ishikawa, MD  ?   ? ?Allergies    ?Patient has no known allergies.   ? ?Review of Systems   ?Review of Systems  ?Constitutional:  Negative for chills and fever.  ?HENT:  Negative for ear pain and sore throat.   ?Eyes:  Negative for pain and visual disturbance.  ?Respiratory:  Negative for cough and shortness of breath.   ?Cardiovascular:  Negative for chest  pain and palpitations.  ?Gastrointestinal:  Negative for abdominal pain and vomiting.  ?Genitourinary:  Negative for dysuria and hematuria.  ?Musculoskeletal:  Negative for arthralgias and back pain.  ?Skin:  Negative for color change and rash.  ?Neurological:  Positive for dizziness, weakness and numbness. Negative for seizures and syncope.  ?All other systems reviewed and are negative. ? ?Physical Exam ?Updated Vital Signs ?BP (!) 232/121   Pulse 96   Temp 98.4 ?F (36.9 ?C) (Oral)   Resp 18   SpO2 94%  ?Physical Exam ?Vitals and nursing note reviewed.  ?Constitutional:   ?   General: He is not in acute distress. ?   Appearance: Normal appearance. He is well-developed.  ?HENT:  ?   Head: Normocephalic and atraumatic.  ?Eyes:  ?   Extraocular Movements: Extraocular movements intact.  ?   Conjunctiva/sclera: Conjunctivae normal.  ?   Pupils: Pupils are equal, round, and reactive to light.  ?Cardiovascular:  ?   Rate and Rhythm: Normal rate and regular rhythm.  ?   Heart sounds: No murmur heard. ?Pulmonary:  ?   Effort: Pulmonary effort is normal. No respiratory distress.  ?   Breath sounds: Normal breath sounds.  ?Abdominal:  ?   Palpations: Abdomen is soft.  ?   Tenderness: There is no abdominal tenderness.  ?Musculoskeletal:     ?   General:  No swelling.  ?   Cervical back: Normal range of motion and neck supple.  ?Skin: ?   General: Skin is warm and dry.  ?   Capillary Refill: Capillary refill takes less than 2 seconds.  ?Neurological:  ?   General: No focal deficit present.  ?   Mental Status: He is alert.  ?   Cranial Nerves: No cranial nerve deficit.  ?   Sensory: No sensory deficit.  ?   Motor: No weakness.  ?   Coordination: Coordination normal.  ?   Comments: On neuro exam at this point in time no focal deficit.  Cranial nerves working no coordination abnormalities finger-nose heel-to-shin strength is all good and sensory is intact.  ?Psychiatric:     ?   Mood and Affect: Mood normal.  ? ? ?ED Results /  Procedures / Treatments   ?Labs ?(all labs ordered are listed, but only abnormal results are displayed) ?Labs Reviewed  ?BASIC METABOLIC PANEL - Abnormal; Notable for the following components:  ?    Result Value  ? Potassium 3.3 (*)   ? Glucose, Bld 175 (*)   ? Creatinine, Ser 1.51 (*)   ? GFR, Estimated 48 (*)   ? All other components within normal limits  ?URINALYSIS, ROUTINE W REFLEX MICROSCOPIC - Abnormal; Notable for the following components:  ? APPearance HAZY (*)   ? Hgb urine dipstick SMALL (*)   ? Protein, ur 100 (*)   ? Nitrite POSITIVE (*)   ? Leukocytes,Ua LARGE (*)   ? WBC, UA >50 (*)   ? Bacteria, UA MANY (*)   ? All other components within normal limits  ?RESP PANEL BY RT-PCR (FLU A&B, COVID) ARPGX2  ?URINE CULTURE  ?CBC  ?ETHANOL  ?PROTIME-INR  ?APTT  ?DIFFERENTIAL  ?RAPID URINE DRUG SCREEN, HOSP PERFORMED  ?HEPATIC FUNCTION PANEL  ?I-STAT CHEM 8, ED  ? ? ?EKG ?EKG Interpretation ? ?Date/Time:  Saturday January 06 2022 07:56:49 EDT ?Ventricular Rate:  104 ?PR Interval:  120 ?QRS Duration: 100 ?QT Interval:  356 ?QTC Calculation: 468 ?R Axis:   63 ?Text Interpretation: Sinus tachycardia Possible Left atrial enlargement Left ventricular hypertrophy with repolarization abnormality ( Sokolow-Lyon , Cornell product ) Abnormal ECG When compared with ECG of 19-Feb-2020 10:23, PREVIOUS ECG IS PRESENT Confirmed by Vanetta Mulders [phone]) on 01/06/2022 12:05:16 PM ? ?Radiology ?No results found. ? ?Procedures ?Procedures  ? ? ?Medications Ordered in ED ?Medications  ?sodium chloride flush (NS) 0.9 % injection 3 mL (has no administration in time range)  ?labetalol (NORMODYNE) injection 20 mg (has no administration in time range)  ? ? ?ED Course/ Medical Decision Making/ A&P ?  ?                        ?Medical Decision Making ?Amount and/or Complexity of Data Reviewed ?Labs: ordered. ?Radiology: ordered. ? ?Risk ?Prescription drug management. ? ? ?Patient is out of the window for TNK.  Also patient without any  significant band type symptoms.  So not any indication for interventional radiology or neuroradiology. ? ?Patient does have very high blood pressure here.  With systolics well over 220.  Based on that we will give some labetalol we will hold off on calcium channel blocker. ? ?Have used the code stroke order set.  Will get head CT.  If negative I think he needs an MRI. ? ?Labs without significant abnormalities other than his urinalysis definitely is a urinary tract infection with  greater than 50 white blood cells and positive nitrite.  Potassium down a little bit at 3.3 a little bit of renal insufficiency with a GFR 48.  CBC is normal patient's EKG without any acute findings.  But is a little change compared to his old. ? ?Urine has been sent for culture. ? ?We will start with a dose of IV Rocephin while we are waiting for the rest of the work-up.  This could be urinary tract infection always with males always a possible prostate infection. ? ?CT head without any acute findings.  MRI raises concerns for new acute infarct.  Discussed with neuro hospitalist they concur and feel the patient needs admission for stroke work-up but blood pressure control may be some sugar control.  No aggressive blood pressure control for right now. ? ?Systolic blood pressures now around 209.  After the labetalol. ? ?Discussed with the neuro hospitalist team and they concur that they think the stroke is there.  Also discussed with the internal medicine unassigned teaching service they will admit.  They are aware of the concern for urinary tract infection or either prostate infection as well ? ?CRITICAL CARE ?Performed by: Vanetta MuldersScott Taydon Nasworthy ?Total critical care time: 45 minutes ?Critical care time was exclusive of separately billable procedures and treating other patients. ?Critical care was necessary to treat or prevent imminent or life-threatening deterioration. ?Critical care was time spent personally by me on the following activities:  development of treatment plan with patient and/or surrogate as well as nursing, discussions with consultants, evaluation of patient's response to treatment, examination of patient, obtaining history from patien

## 2022-01-07 ENCOUNTER — Observation Stay (HOSPITAL_BASED_OUTPATIENT_CLINIC_OR_DEPARTMENT_OTHER): Payer: Medicare HMO

## 2022-01-07 DIAGNOSIS — I6389 Other cerebral infarction: Secondary | ICD-10-CM | POA: Diagnosis not present

## 2022-01-07 DIAGNOSIS — N1831 Chronic kidney disease, stage 3a: Secondary | ICD-10-CM | POA: Diagnosis present

## 2022-01-07 DIAGNOSIS — E785 Hyperlipidemia, unspecified: Secondary | ICD-10-CM | POA: Diagnosis present

## 2022-01-07 DIAGNOSIS — I10 Essential (primary) hypertension: Secondary | ICD-10-CM | POA: Diagnosis present

## 2022-01-07 DIAGNOSIS — E119 Type 2 diabetes mellitus without complications: Secondary | ICD-10-CM

## 2022-01-07 DIAGNOSIS — I639 Cerebral infarction, unspecified: Secondary | ICD-10-CM | POA: Diagnosis not present

## 2022-01-07 LAB — LIPID PANEL
Cholesterol: 186 mg/dL (ref 0–200)
HDL: 43 mg/dL (ref >40)
LDL Cholesterol: 127 mg/dL — ABNORMAL HIGH (ref 0–99)
Total CHOL/HDL Ratio: 4.3 RATIO
Triglycerides: 82 mg/dL (ref <150)
VLDL: 16 mg/dL (ref 0–40)

## 2022-01-07 LAB — ECHOCARDIOGRAM COMPLETE
Area-P 1/2: 5.27 cm2
Calc EF: 29.6 %
Height: 68 in
MV M vel: 6.38 m/s
MV Peak grad: 162.8 mmHg
Radius: 0.3 cm
S' Lateral: 4.9 cm
Single Plane A2C EF: 32.4 %
Single Plane A4C EF: 26.4 %
Weight: 2880 oz

## 2022-01-07 LAB — BASIC METABOLIC PANEL
Anion gap: 7 (ref 5–15)
BUN: 14 mg/dL (ref 8–23)
CO2: 26 mmol/L (ref 22–32)
Calcium: 8.7 mg/dL — ABNORMAL LOW (ref 8.9–10.3)
Chloride: 104 mmol/L (ref 98–111)
Creatinine, Ser: 1.41 mg/dL — ABNORMAL HIGH (ref 0.61–1.24)
GFR, Estimated: 52 mL/min — ABNORMAL LOW (ref >60)
Glucose, Bld: 130 mg/dL — ABNORMAL HIGH (ref 70–99)
Potassium: 4 mmol/L (ref 3.5–5.1)
Sodium: 137 mmol/L (ref 135–145)

## 2022-01-07 LAB — CBG MONITORING, ED
Glucose-Capillary: 140 mg/dL — ABNORMAL HIGH (ref 70–99)
Glucose-Capillary: 167 mg/dL — ABNORMAL HIGH (ref 70–99)
Glucose-Capillary: 138 mg/dL — ABNORMAL HIGH (ref 70–99)

## 2022-01-07 MED ORDER — HYDRALAZINE HCL 20 MG/ML IJ SOLN: 10.0000 mg | INTRAMUSCULAR | Status: DC | PRN

## 2022-01-07 MED ORDER — LOSARTAN POTASSIUM 25 MG PO TABS
25.0000 mg | ORAL_TABLET | Freq: Every day | ORAL | Status: DC
  Administered 2022-01-07 – 2022-01-08 (×2): 25 mg via ORAL
  Filled 2022-01-07 (×2): qty 1

## 2022-01-07 MED ORDER — ASPIRIN EC 81 MG PO TBEC
162.0000 mg | DELAYED_RELEASE_TABLET | Freq: Every day | ORAL | Status: DC
  Administered 2022-01-08: 162 mg via ORAL
  Filled 2022-01-07: qty 2

## 2022-01-07 MED ORDER — ENOXAPARIN SODIUM 40 MG/0.4ML IJ SOSY
40.0000 mg | PREFILLED_SYRINGE | INTRAMUSCULAR | Status: DC
  Administered 2022-01-08 – 2022-01-09 (×2): 40 mg via SUBCUTANEOUS
  Filled 2022-01-07 (×2): qty 0.4

## 2022-01-07 MED ORDER — LABETALOL HCL 5 MG/ML IV SOLN: 5.0000 mg | INTRAVENOUS | Status: DC | PRN

## 2022-01-07 MED ORDER — AMLODIPINE BESYLATE 5 MG PO TABS
5.0000 mg | ORAL_TABLET | Freq: Every day | ORAL | Status: DC
  Administered 2022-01-07 – 2022-01-08 (×2): 5 mg via ORAL
  Filled 2022-01-07 (×2): qty 1

## 2022-01-07 NOTE — Progress Notes (Signed)
? ?HD#0 ?SUBJECTIVE:  ?Patient Summary: Lum Pfab is a 76 y.o. with a pertinent PMH of , who presented with hypertension, diabetes and admitted for acute CVA.  ? ?Overnight Events:  ?No acute events overnight ? ?Interim History:  ?Feels well today, walking to and from bathroom without difficulties. He believes he is to blame and knew it was a stroke immediately. He voices not taking the medicines for his blood pressure led to this. Discussed taking medications to manage his blood pressure, diabetes, and blood thinner.  ? ?Addendum 1719 ? ?Presented to bedside to update patient and niece on echocardiogram results.  Found to have EF of 25 to 30% with global hypokinesis.  We discussed the need for him to stay an additional evening to optimize medications as well as consult cardiology to see if further interventions are needed patient is agreeable to this.  Questions answered and no further questions at end of discussion ? ?OBJECTIVE:  ?Vital Signs: ?Vitals:  ? 01/07/22 0300 01/07/22 0500 01/07/22 0559 01/07/22 0600  ?BP: (!) 205/118 (!) 180/102  (!) 204/99  ?Pulse: 90 77  77  ?Resp: (!) 24 15  (!) 21  ?Temp:   98.4 ?F (36.9 ?C)   ?TempSrc:   Oral   ?SpO2: 96% 92%  95%  ?Weight:      ?Height:      ? ?Supplemental O2: Room Air ?SpO2: 95 % ? ?Filed Weights  ? 01/06/22 2346  ?Weight: 81.6 kg  ? ? ? ?Intake/Output Summary (Last 24 hours) at 01/07/2022 0603 ?Last data filed at 01/07/2022 0141 ?Gross per 24 hour  ?Intake 378.11 ml  ?Output --  ?Net 378.11 ml  ? ?Net IO Since Admission: 378.11 mL [01/07/22 0603] ? ?Physical Exam: ?Constitutional: No acute distress ?HENT: Normocephalic atraumatic ?Eyes: Extract motions intact ?Cardiovascular:  ?Pulmonary/Chest: Normal respirations on room air ?MSK: Normal tone, skin is warm and dry ?Neurological: Alert and oriented x3 ?Psych: Normal mood ? ?Patient Lines/Drains/Airways Status   ? ? Active Line/Drains/Airways   ? ? Name Placement date Placement time Site Days  ? Peripheral IV  01/06/22 18 G Left Antecubital 01/06/22  0754  Antecubital  1  ? ?  ?  ? ?  ? ? ?Pertinent Labs: ? ?  Latest Ref Rng & Units 01/06/2022  ?  8:13 AM 02/19/2020  ? 10:20 AM  ?CBC  ?WBC 4.0 - 10.5 K/uL 6.4   5.5    ?Hemoglobin 13.0 - 17.0 g/dL 15.5   15.7    ?Hematocrit 39.0 - 52.0 % 47.1   47.5    ?Platelets 150 - 400 K/uL 177   246    ? ? ? ?  Latest Ref Rng & Units 01/06/2022  ? 12:06 PM 01/06/2022  ?  8:13 AM 03/28/2020  ?  3:52 PM  ?CMP  ?Glucose 70 - 99 mg/dL  175   157    ?BUN 8 - 23 mg/dL  16   18    ?Creatinine 0.61 - 1.24 mg/dL  1.51   1.46    ?Sodium 135 - 145 mmol/L  140   140    ?Potassium 3.5 - 5.1 mmol/L  3.3   4.6    ?Chloride 98 - 111 mmol/L  107   102    ?CO2 22 - 32 mmol/L  25   26    ?Calcium 8.9 - 10.3 mg/dL  8.9   9.6    ?Total Protein 6.5 - 8.1 g/dL 7.6      ?  Total Bilirubin 0.3 - 1.2 mg/dL 0.9      ?Alkaline Phos 38 - 126 U/L 67      ?AST 15 - 41 U/L 20      ?ALT 0 - 44 U/L 19      ? ? ?Recent Labs  ?  01/06/22 ?2105  ?GLUCAP 167*  ?  ? ?Pertinent Imaging: ?CT ANGIO HEAD NECK W WO CM ? ?Result Date: 01/06/2022 ?CLINICAL DATA:  Provided history: Stroke.  Hypertension, stroke. EXAM: CT ANGIOGRAPHY HEAD AND NECK TECHNIQUE: Multidetector CT imaging of the head and neck was performed using the standard protocol during bolus administration of intravenous contrast. Multiplanar CT image reconstructions and MIPs were obtained to evaluate the vascular anatomy. Carotid stenosis measurements (when applicable) are obtained utilizing NASCET criteria, using the distal internal carotid diameter as the denominator. RADIATION DOSE REDUCTION: This exam was performed according to the departmental dose-optimization program which includes automated exposure control, adjustment of the mA and/or kV according to patient size and/or use of iterative reconstruction technique. CONTRAST:  118mL OMNIPAQUE IOHEXOL 350 MG/ML SOLN COMPARISON:  Brain MRI 01/06/2022.  Head CT 01/06/2022. FINDINGS: CTA NECK FINDINGS Aortic arch: Standard  aortic branching. The visualized aortic arch is normal in caliber. Streak and beam hardening artifact arising from a dense left-sided contrast bolus partially obscures the left subclavian artery. Within this limitation, there is no appreciable hemodynamically significant innominate or proximal subclavian artery stenosis. Right carotid system: CCA and ICA patent within the neck without stenosis. Minimal soft plaque about the carotid bifurcation and within the proximal ICA. Left carotid system: CCA and ICA patent within the neck without stenosis or significant atherosclerotic disease. Vertebral arteries: The arteries patent within the neck. Streak and beam hardening artifact arising from a dense left-sided contrast bolus limits evaluation of the proximal V1 left vertebral artery. No appreciable significant stenosis within the cervical vertebral arteries. Skeleton: Cervical spondylosis. No acute bony abnormality or aggressive osseous lesion. Other neck: Subcentimeter nodule within the right thyroid lobe, not meeting consensus criteria for ultrasound follow-up based on size. 8 mm enhancing nodule within the right parotid gland. Upper chest: Incompletely imaged bilateral pleural effusions. Ground-glass opacity within the imaged lung apices, likely reflecting edema. Review of the MIP images confirms the above findings CTA HEAD FINDINGS Anterior circulation: The intracranial internal carotid arteries are patent. The M1 middle cerebral arteries are patent. Atherosclerotic irregularity of the M2 and more distal MCA vessels, bilaterally. Most notably, there is a moderate/severe focal stenosis within a proximal to mid M2 left MCA vessel (series 12, image 29). The anterior cerebral arteries are patent. No intracranial aneurysm is identified. Posterior circulation: The intracranial vertebral arteries are patent. The basilar artery is patent. The posterior cerebral arteries are patent. The basilar artery is developmentally  diminutive. The bilateral P1 segments are also developmentally diminutive and there are sizable bilateral posterior communicating arteries. Atherosclerotic irregularity of both posterior cerebral arteries. Most notably, there is a severe focal stenosis within a left PCA branch at the P2/P3 junction (series 12, image 25). Venous sinuses: Within the limitations of contrast timing, no convincing thrombus. Anatomic variants: As described. Review of the MIP images confirms the above findings IMPRESSION: CTA neck: 1. The common carotid, internal carotid and vertebral arteries are patent within the neck without stenosis. Minimal atherosclerotic plaque about the right carotid bifurcation and within the proximal right ICA. 2. 8 mm enhancing nodule within the right parotid gland, which may reflect a nonspecific enlarged intraparotid lymph node or a primary parotid  neoplasm. 3. Edema within the imaged lung apices. 4. Incompletely imaged bilateral pleural effusions. CTA head: 1. No intracranial large vessel occlusion. 2. Intracranial atherosclerotic disease with multifocal stenoses, most notably as follows. 3. Moderate/severe focal stenosis within a proximal-to-mid M2 left MCA vessel. 4. Severe focal stenosis within a left PCA branch at the P2/P3 junction. Electronically Signed   By: Kellie Simmering D.O.   On: 01/06/2022 17:41  ? ?CT HEAD WO CONTRAST ? ?Result Date: 01/06/2022 ?CLINICAL DATA:  76 year old male with fall and altered sensations. EXAM: CT HEAD WITHOUT CONTRAST TECHNIQUE: Contiguous axial images were obtained from the base of the skull through the vertex without intravenous contrast. RADIATION DOSE REDUCTION: This exam was performed according to the departmental dose-optimization program which includes automated exposure control, adjustment of the mA and/or kV according to patient size and/or use of iterative reconstruction technique. COMPARISON:  None. FINDINGS: Brain: No evidence of acute infarction, hemorrhage,  hydrocephalus, extra-axial collection or mass lesion/mass effect. Moderate bilateral white matter hypodensities are nonspecific but likely represent chronic small-vessel white matter ischemic changes. Vascular

## 2022-01-07 NOTE — ED Notes (Signed)
Pt ambulated to restroom without assistance at this time ?

## 2022-01-07 NOTE — ED Notes (Signed)
Pt returned to bed and placed back on monitor without assistance at this time ?

## 2022-01-07 NOTE — Progress Notes (Addendum)
STROKE TEAM PROGRESS NOTE  ? ?INTERVAL HISTORY ?No family at the bedside. Patient seen in the emergency department. He states that he stopped taking his medication about 3 months ago because he thought his "numbers looked okay." He is agreeable to resuming medications for his blood pressure.  Amlodipine 5 mg ordered. ? ?Vitals:  ? 01/07/22 0700 01/07/22 0809 01/07/22 1000 01/07/22 1109  ?BP: (!) 179/82 (!) 212/91 (!) 179/86 (!) 209/116  ?Pulse: 70 94 74   ?Resp: 17 (!) 22 18   ?Temp:      ?TempSrc:      ?SpO2: 91% 95% 95%   ?Weight:      ?Height:      ? ?CBC:  ?Recent Labs  ?Lab 01/06/22 ?[id number] 01/06/22 ?1206  ?WBC 6.4  --   ?NEUTROABS  --  4.8  ?HGB 15.5  --   ?HCT 47.1  --   ?MCV 90.8  --   ?PLT 177  --   ? ?Basic Metabolic Panel:  ?Recent Labs  ?Lab 01/06/22 ?[id number] 01/07/22 ?[id number]  ?NA 140 137  ?K 3.3* 4.0  ?CL 107 104  ?CO2 25 26  ?GLUCOSE 175* 130*  ?BUN 16 14  ?CREATININE 1.51* 1.41*  ?CALCIUM 8.9 8.7*  ? ?Lipid Panel:  ?Recent Labs  ?Lab 01/07/22 ?[id number]  ?CHOL 186  ?TRIG 82  ?HDL 43  ?CHOLHDL 4.3  ?VLDL 16  ?LDLCALC 127*  ? ?HgbA1c: No results for input(s): HGBA1C in the last 168 hours. ?Urine Drug Screen:  ?Recent Labs  ?Lab 01/06/22 ?1039  ?LABOPIA NONE DETECTED  ?COCAINSCRNUR NONE DETECTED  ?LABBENZ NONE DETECTED  ?AMPHETMU NONE DETECTED  ?THCU NONE DETECTED  ?LABBARB NONE DETECTED  ?  ?Alcohol Level  ?Recent Labs  ?Lab 01/06/22 ?1201  ?ETH <10  ? ? ?IMAGING past 24 hours ?CT ANGIO HEAD NECK W WO CM ? ?Result Date: 01/06/2022 ?CLINICAL DATA:  Provided history: Stroke.  Hypertension, stroke. EXAM: CT ANGIOGRAPHY HEAD AND NECK TECHNIQUE: Multidetector CT imaging of the head and neck was performed using the standard protocol during bolus administration of intravenous contrast. Multiplanar CT image reconstructions and MIPs were obtained to evaluate the vascular anatomy. Carotid stenosis measurements (when applicable) are obtained utilizing NASCET criteria, using the distal internal carotid diameter as the  denominator. RADIATION DOSE REDUCTION: This exam was performed according to the departmental dose-optimization program which includes automated exposure control, adjustment of the mA and/or kV according to patient size and/or use of iterative reconstruction technique. CONTRAST:  100mL OMNIPAQUE IOHEXOL 350 MG/ML SOLN COMPARISON:  Brain MRI 01/06/2022.  Head CT 01/06/2022. FINDINGS: CTA NECK FINDINGS Aortic arch: Standard aortic branching. The visualized aortic arch is normal in caliber. Streak and beam hardening artifact arising from a dense left-sided contrast bolus partially obscures the left subclavian artery. Within this limitation, there is no appreciable hemodynamically significant innominate or proximal subclavian artery stenosis. Right carotid system: CCA and ICA patent within the neck without stenosis. Minimal soft plaque about the carotid bifurcation and within the proximal ICA. Left carotid system: CCA and ICA patent within the neck without stenosis or significant atherosclerotic disease. Vertebral arteries: The arteries patent within the neck. Streak and beam hardening artifact arising from a dense left-sided contrast bolus limits evaluation of the proximal V1 left vertebral artery. No appreciable significant stenosis within the cervical vertebral arteries. Skeleton: Cervical spondylosis. No acute bony abnormality or aggressive osseous lesion. Other neck: Subcentimeter nodule within the right thyroid lobe, not meeting consensus criteria for ultrasound follow-up based on size. 8  mm enhancing nodule within the right parotid gland. Upper chest: Incompletely imaged bilateral pleural effusions. Ground-glass opacity within the imaged lung apices, likely reflecting edema. Review of the MIP images confirms the above findings CTA HEAD FINDINGS Anterior circulation: The intracranial internal carotid arteries are patent. The M1 middle cerebral arteries are patent. Atherosclerotic irregularity of the M2 and more  distal MCA vessels, bilaterally. Most notably, there is a moderate/severe focal stenosis within a proximal to mid M2 left MCA vessel (series 12, image 29). The anterior cerebral arteries are patent. No intracranial aneurysm is identified. Posterior circulation: The intracranial vertebral arteries are patent. The basilar artery is patent. The posterior cerebral arteries are patent. The basilar artery is developmentally diminutive. The bilateral P1 segments are also developmentally diminutive and there are sizable bilateral posterior communicating arteries. Atherosclerotic irregularity of both posterior cerebral arteries. Most notably, there is a severe focal stenosis within a left PCA branch at the P2/P3 junction (series 12, image 25). Venous sinuses: Within the limitations of contrast timing, no convincing thrombus. Anatomic variants: As described. Review of the MIP images confirms the above findings IMPRESSION: CTA neck: 1. The common carotid, internal carotid and vertebral arteries are patent within the neck without stenosis. Minimal atherosclerotic plaque about the right carotid bifurcation and within the proximal right ICA. 2. 8 mm enhancing nodule within the right parotid gland, which may reflect a nonspecific enlarged intraparotid lymph node or a primary parotid neoplasm. 3. Edema within the imaged lung apices. 4. Incompletely imaged bilateral pleural effusions. CTA head: 1. No intracranial large vessel occlusion. 2. Intracranial atherosclerotic disease with multifocal stenoses, most notably as follows. 3. Moderate/severe focal stenosis within a proximal-to-mid M2 left MCA vessel. 4. Severe focal stenosis within a left PCA branch at the P2/P3 junction. Electronically Signed   By: Jackey Loge D.O.   On: 01/06/2022 17:41  ? ?MR Brain Wo Contrast (neuro protocol) ? ?Result Date: 01/06/2022 ?CLINICAL DATA:  Provided history: Neuro deficit, acute, stroke suspected. EXAM: MRI HEAD WITHOUT CONTRAST TECHNIQUE:  Multiplanar, multiecho pulse sequences of the brain and surrounding structures were obtained without intravenous contrast. COMPARISON:  Head CT 01/06/2022 FINDINGS: Brain: No age advanced or lobar predominant parenchymal atrophy. Moderate to moderately advanced multifocal T2 FLAIR hyperintense signal abnormality within the cerebral white matter, nonspecific but compatible with chronic small vessel ischemic disease. 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 A punctate acute infarct is questioned within the left frontoparietal periventricular white matter, versus artifact (series 2, image 30). 6 mm focus of susceptibility weighted signal loss within the right parietal lobe, which may reflect a small chronic cortical infarct with associated chronic hemosiderin deposition, a chronic microhemorrhage or a cavernoma (series 7, image 66). No evidence of an intracranial mass No extra-axial fluid collection. No midline shift. Vascular: Maintained flow voids within the proximal large arterial vessels. Skull and upper cervical spine: No focal suspicious marrow lesion. Sinuses/Orbits: Visualized orbits show no acute finding. Trace mucosal thickening within the bilateral ethmoid and maxillary sinuses. IMPRESSION: Punctate acute infarct versus artifact within the left frontoparietal periventricular white matter. Otherwise, no evidence of acute intracranial abnormality. 6 mm focus of susceptibility-weighted signal loss within the right parietal lobe, which may reflect a small chronic cortical infarct with associated chronic hemosiderin deposition, a chronic microhemorrhage or a cavernoma. Moderate to moderately advanced chronic small vessel ischemic changes within the cerebral white matter. 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 Electronically Signed   By: Jackey Loge D.O.   On: 01/06/2022 14:51   ? ?  PHYSICAL EXAM ? ?Physical Exam  ?Constitutional: Appears well-developed and  well-nourished.  Elderly African-American male ?Cardiovascular: Normal rate and regular rhythm.  ?Respiratory: Effort normal, non-labored breathing ? ?Neuro: ?Mental Status: ?Patient is awake, alert, oriented to person

## 2022-01-07 NOTE — Progress Notes (Signed)
SLP Cancellation Note ? ?Patient Details ?Name: Joseph Patrick ?MRN: [id number] ?DOB: 10-May-1946 ? ? ?Cancelled treatment:       Reason Eval/Treat Not Completed: SLP screened, no needs identified, will sign off ? ?Sonia Baller, MA, CCC-SLP ?Speech Therapy ? ?

## 2022-01-07 NOTE — ED Notes (Signed)
Ambulated to restroom without assistance 

## 2022-01-07 NOTE — Progress Notes (Signed)
?  Echocardiogram ?2D Echocardiogram has been performed. ? ?Delcie Roch ?01/07/2022, 3:23 PM ?

## 2022-01-08 DIAGNOSIS — I6300 Cerebral infarction due to thrombosis of unspecified precerebral artery: Secondary | ICD-10-CM | POA: Diagnosis not present

## 2022-01-08 DIAGNOSIS — I10 Essential (primary) hypertension: Secondary | ICD-10-CM

## 2022-01-08 DIAGNOSIS — I5020 Unspecified systolic (congestive) heart failure: Secondary | ICD-10-CM | POA: Diagnosis present

## 2022-01-08 DIAGNOSIS — I639 Cerebral infarction, unspecified: Secondary | ICD-10-CM | POA: Diagnosis not present

## 2022-01-08 LAB — POCT I-STAT, CHEM 8
BUN: 17 mg/dL (ref 8–23)
Calcium, Ion: 1.06 mmol/L — ABNORMAL LOW (ref 1.15–1.40)
Chloride: 106 mmol/L (ref 98–111)
Creatinine, Ser: 1.4 mg/dL — ABNORMAL HIGH (ref 0.61–1.24)
Glucose, Bld: 136 mg/dL — ABNORMAL HIGH (ref 70–99)
HCT: 45 % (ref 39.0–52.0)
Hemoglobin: 15.3 g/dL (ref 13.0–17.0)
Potassium: 3.7 mmol/L (ref 3.5–5.1)
Sodium: 142 mmol/L (ref 135–145)
TCO2: 27 mmol/L (ref 22–32)

## 2022-01-08 LAB — BASIC METABOLIC PANEL
Anion gap: 6 (ref 5–15)
BUN: 13 mg/dL (ref 8–23)
CO2: 28 mmol/L (ref 22–32)
Calcium: 9 mg/dL (ref 8.9–10.3)
Chloride: 106 mmol/L (ref 98–111)
Creatinine, Ser: 1.67 mg/dL — ABNORMAL HIGH (ref 0.61–1.24)
GFR, Estimated: 42 mL/min — ABNORMAL LOW (ref >60)
Glucose, Bld: 130 mg/dL — ABNORMAL HIGH (ref 70–99)
Potassium: 3.8 mmol/L (ref 3.5–5.1)
Sodium: 140 mmol/L (ref 135–145)

## 2022-01-08 LAB — MAGNESIUM: Magnesium: 2.1 mg/dL (ref 1.7–2.4)

## 2022-01-08 LAB — GLUCOSE, CAPILLARY
Glucose-Capillary: 118 mg/dL — ABNORMAL HIGH (ref 70–99)
Glucose-Capillary: 162 mg/dL — ABNORMAL HIGH (ref 70–99)
Glucose-Capillary: 134 mg/dL — ABNORMAL HIGH (ref 70–99)
Glucose-Capillary: 138 mg/dL — ABNORMAL HIGH (ref 70–99)

## 2022-01-08 MED ORDER — METOPROLOL TARTRATE 12.5 MG HALF TABLET
12.5000 mg | ORAL_TABLET | Freq: Two times a day (BID) | ORAL | Status: DC
  Administered 2022-01-08: 12.5 mg via ORAL
  Filled 2022-01-08: qty 1

## 2022-01-08 MED ORDER — POTASSIUM CHLORIDE 20 MEQ PO PACK
20.0000 meq | PACK | Freq: Once | ORAL | Status: DC
Start: 2022-01-08 — End: 2022-01-08

## 2022-01-08 MED ORDER — LOSARTAN POTASSIUM 50 MG PO TABS
50.0000 mg | ORAL_TABLET | Freq: Every day | ORAL | Status: DC
Start: 2022-01-09 — End: 2022-01-08

## 2022-01-08 MED ORDER — METOPROLOL TARTRATE 50 MG PO TABS
50.0000 mg | ORAL_TABLET | Freq: Two times a day (BID) | ORAL | Status: DC
  Administered 2022-01-08 – 2022-01-10 (×4): 50 mg via ORAL
  Filled 2022-01-08 (×4): qty 1

## 2022-01-08 MED ORDER — SACUBITRIL-VALSARTAN 24-26 MG PO TABS
1.0000 | ORAL_TABLET | Freq: Two times a day (BID) | ORAL | Status: DC
  Administered 2022-01-09 – 2022-01-10 (×3): 1 via ORAL
  Filled 2022-01-08 (×4): qty 1

## 2022-01-08 MED ORDER — SPIRONOLACTONE 12.5 MG HALF TABLET
12.5000 mg | ORAL_TABLET | Freq: Every day | ORAL | Status: DC
Start: 2022-01-08 — End: 2022-01-08

## 2022-01-08 MED ORDER — ASPIRIN EC 81 MG PO TBEC
81.0000 mg | DELAYED_RELEASE_TABLET | Freq: Every day | ORAL | Status: DC
  Administered 2022-01-09 – 2022-01-10 (×2): 81 mg via ORAL
  Filled 2022-01-08 (×2): qty 1

## 2022-01-08 MED ORDER — SACUBITRIL-VALSARTAN 24-26 MG PO TABS
1.0000 | ORAL_TABLET | Freq: Two times a day (BID) | ORAL | Status: DC
  Filled 2022-01-08: qty 1

## 2022-01-08 MED ORDER — ISOSORB DINITRATE-HYDRALAZINE 20-37.5 MG PO TABS
1.0000 | ORAL_TABLET | Freq: Three times a day (TID) | ORAL | Status: DC
  Administered 2022-01-08 – 2022-01-10 (×5): 1 via ORAL
  Filled 2022-01-08 (×5): qty 1

## 2022-01-08 MED ORDER — CLOPIDOGREL BISULFATE 75 MG PO TABS
75.0000 mg | ORAL_TABLET | Freq: Every day | ORAL | Status: DC
  Administered 2022-01-08 – 2022-01-10 (×3): 75 mg via ORAL
  Filled 2022-01-08 (×3): qty 1

## 2022-01-08 NOTE — TOC Initial Note (Signed)
Transition of Care (TOC) - Initial/Assessment Note  ? ? ?Patient Details  ?Name: Joseph Patrick ?MRN: [id number] ?Date of Birth: 1945/11/13 ? ?Transition of Care (TOC) CM/SW Contact:    ?Pollie Friar, RN ?Phone Number: ?01/08/2022, 12:17 PM ? ?Clinical Narrative:                 ?Patient is from home alone. Pt states his sister lives down the hall and can check on him.  ?No DME. Patient states he quit taking his home medications about 3 months ago, she didn't feel he needed them.  ?PCP: [facility] Alfonse Sp[facility] at [facility]. ?Pt drives self.  ?No f/u per PT/OT.  ?Pt has insurance: [facility]. CM has taken a copy of his cards to Admissions.  ?TOC following. ? ?Expected Discharge Plan: Home/Self Care ?Barriers to Discharge: Continued Medical Work up ? ? ?Patient Goals and CMS Choice ?  ?  ?  ? ?Expected Discharge Plan and Services ?Expected Discharge Plan: Home/Self Care ?  ?Discharge Planning Services: CM Consult ?  ?Living arrangements for the past 2 months: Apartment ?                ?  ?  ?  ?  ?  ?  ?  ?  ?  ?  ? ?Prior Living Arrangements/Services ?Living arrangements for the past 2 months: Apartment ?Lives with:: Self ?Patient language and need for interpreter reviewed:: Yes ?Do you feel safe going back to the place where you live?: Yes      ?  ?  ?  ?Criminal Activity/Legal Involvement Pertinent to Current Situation/Hospitalization: No - Comment as needed ? ?Activities of Daily Living ?Home Assistive Devices/Equipment: None ?ADL Screening (condition at time of admission) ?Patient's cognitive ability adequate to safely complete daily activities?: No ?Is the patient deaf or have difficulty hearing?: No ?Does the patient have difficulty seeing, even when wearing glasses/contacts?: No ?Does the patient have difficulty concentrating, remembering, or making decisions?: No ?Patient able to express need for assistance with ADLs?: No ?Does the patient have difficulty dressing or bathing?: No ?Independently performs ADLs?:  No ?Communication: Independent ?Dressing (OT): Independent ?Grooming: Independent ?Feeding: Independent ?Bathing: Independent ?Toileting: Independent ?In/Out Bed: Independent ?Walks in Home: Independent ?Does the patient have difficulty walking or climbing stairs?: No ?Weakness of Legs: None ?Weakness of Arms/Hands: None ? ?Permission Sought/Granted ?  ?  ?   ?   ?   ?   ? ?Emotional Assessment ?Appearance:: Appears stated age ?Attitude/Demeanor/Rapport: Engaged ?Affect (typically observed): Accepting ?Orientation: : Oriented to Self, Oriented to Place, Oriented to  Time, Oriented to Situation ?  ?Psych Involvement: No (comment) ? ?Admission diagnosis:  CVA (cerebral vascular accident) ([place]) [I63.9] ?Acute cystitis without hematuria [N30.00] ?Cerebrovascular accident (CVA), unspecified mechanism ([place]) [I63.9] ?Patient Active Problem List  ? Diagnosis Date Noted  ? Hypertension 01/07/2022  ? Hyperlipidemia 01/07/2022  ? Diabetes ([place]) 01/07/2022  ? Stage 3a chronic kidney disease (CKD) ([place]) 01/07/2022  ? Cerebral infarction ([place]) 01/06/2022  ? ?PCP:  Patient, No Pcp Per (Inactive) ?Pharmacy:   ?[facility] [place] ?[address] [place] ?[place] [address] ?Phone: [phone] Fax: [phone] ? ?[id number] [address] [place] ?[place] ?[place] [address] ?Phone: [phone] Fax: [phone] ? ? ? ? ?Social Determinants of Health (SDOH) Interventions ?  ? ?Readmission Risk Interventions ?   ? View : No data to display.  ?  ?  ?  ? ? ? ?

## 2022-01-08 NOTE — Progress Notes (Addendum)
? ?HD#0 ?SUBJECTIVE:  ?Patient Summary: Joseph Patrick is a 76 y.o. 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  ? ?Overnight Events:  ?No acute events overnight ? ?Interim History:  ?Assessed at bedside this AM.  He states that he feels well this morning.  He denies further symptoms in his right leg.  We talked about findings of ultrasound of heart.  He states that he would be willing to take medications to help with his heart failure.  I spoke with him about consulting heart doctors to see him while in the hospital and they would help guide [place] with determining if he needed additional procedures.  His niece was at bedside and helped to encourage patient with importance of taking medications following discharge.  No other concerns at this time. ? ?OBJECTIVE:  ?Vital Signs: ?Vitals:  ? 01/07/22 1855 01/07/22 2038 01/08/22 0023 01/08/22 0400  ?BP: (!) 192/109 (!) 186/94 (!) 187/111 (!) 167/97  ?Pulse:  85 79 74  ?Resp: 19 16 16 17   ?Temp:  98.2 ?F (36.8 ?C) 97.8 ?F (36.6 ?C) 97.9 ?F (36.6 ?C)  ?TempSrc:  Oral Axillary Oral  ?SpO2:  97% 99% 97%  ?Weight:      ?Height:      ? ?Supplemental O2: Room Air ?SpO2: 97 % ? ?Filed Weights  ? 01/06/22 2346  ?Weight: 81.6 kg  ? ? ? ?Intake/Output Summary (Last 24 hours) at 01/08/2022 0620 ?Last data filed at 01/08/2022 0400 ?Gross per 24 hour  ?Intake --  ?Output 450 ml  ?Net -450 ml  ? ? ?Net IO Since Admission: -71.89 mL [01/08/22 0620] ? ?Physical Exam: ?Constitutional: No acute distress ?HENT: Normocephalic atraumatic ?Eyes: EOMI, sclera non-icteric ?Cardiovascular: 2/6 systolic murmur best heard in apex, no JVD appreciated ?Pulmonary/Chest: Normal respirations on room air ?MSK: Normal tone, skin is warm and dry ?Neurological: Alert and oriented x3 ?Psych: Normal mood ? ?Patient Lines/Drains/Airways Status   ? ? Active Line/Drains/Airways   ? ? Name Placement date  Placement time Site Days  ? Peripheral IV 01/06/22 18 G Left Antecubital 01/06/22  0754  Antecubital  1  ? ?  ?  ? ?  ? ? ?Pertinent Labs: ? ?  Latest Ref Rng & Units 01/06/2022  ?  8:13 AM 02/19/2020  ? 10:20 AM  ?CBC  ?WBC 4.0 - 10.5 K/uL 6.4   5.5    ?Hemoglobin 13.0 - 17.0 g/dL 15.5   15.7    ?Hematocrit 39.0 - 52.0 % 47.1   47.5    ?Platelets 150 - 400 K/uL 177   246    ? ? ? ?  Latest Ref Rng & Units 01/08/2022  ? 12:32 AM 01/07/2022  ?  5:51 AM 01/06/2022  ? 12:06 PM  ?CMP  ?Glucose 70 - 99 mg/dL 130   130     ?BUN 8 - 23 mg/dL 13   14     ?Creatinine 0.61 - 1.24 mg/dL 1.67   1.41     ?Sodium 135 - 145 mmol/L 140   137     ?Potassium 3.5 - 5.1 mmol/L 3.8   4.0     ?Chloride 98 - 111 mmol/L 106   104     ?CO2 22 - 32 mmol/L 28   26     ?Calcium 8.9 - 10.3 mg/dL 9.0   8.7     ?Total Protein 6.5 -  8.1 g/dL   7.6    ?Total Bilirubin 0.3 - 1.2 mg/dL   0.9    ?Alkaline Phos 38 - 126 U/L   67    ?AST 15 - 41 U/L   20    ?ALT 0 - 44 U/L   19    ? ? ?Recent Labs  ?  01/07/22 ?0756 01/07/22 ?1111 01/07/22 ?1752  ?GLUCAP 140* 167* 138*  ? ?  ? ?Pertinent Imaging: ?ECHOCARDIOGRAM COMPLETE ? ?Result Date: 01/07/2022 ?   ECHOCARDIOGRAM REPORT   Patient Name:   Joseph Patrick Date of Exam: 01/07/2022 Medical Rec #:  [id number]    Height:       68.0 in Accession #:    [id number]   Weight:       180.0 lb Date of Birth:  [birth year]   BSA:          1.954 m? Patient Age:    89 years     BP:           198/103 mmHg Patient Gender: M            HR:           88 bpm. Exam Location:  Inpatient Procedure: 2D Echo Indications:    stroke  History:        Patient has no prior history of Echocardiogram examinations.                 Chronic kidney disease; Risk Factors:Hypertension, Diabetes and                 Dyslipidemia.  Sonographer:    Johny Chess RDCS Referring Phys: [place]  1. Coarse apical trabeculation, no obvious thrombus (non-contrast). Left ventricular ejection fraction, by estimation, is 25 to 30%. Left  ventricular ejection fraction by 2D MOD biplane is 29.6 %. The left ventricle has severely decreased function. The left ventricle demonstrates global hypokinesis. There is mild left ventricular hypertrophy. Left ventricular diastolic parameters are consistent with Grade II diastolic dysfunction (pseudonormalization). Elevated left ventricular end-diastolic pressure. The E/e' is 26.  2. Right ventricular systolic function is normal. The right ventricular size is normal. Tricuspid regurgitation signal is inadequate for assessing PA pressure.  3. Left atrial size was moderately dilated.  4. The mitral valve is abnormal. Moderate mitral valve regurgitation.  5. The aortic valve is tricuspid. Aortic valve regurgitation is not visualized.  6. The inferior vena cava is normal in size with greater than 50% respiratory variability, suggesting right atrial pressure of 3 mmHg. Comparison(s): No prior Echocardiogram. FINDINGS  Left Ventricle: Coarse apical trabeculation, no obvious thrombus (non-contrast). Left ventricular ejection fraction, by estimation, is 25 to 30%. Left ventricular ejection fraction by 2D MOD biplane is 29.6 %. The left ventricle has severely decreased function. The left ventricle demonstrates global hypokinesis. The left ventricular internal cavity size was normal in size. There is mild left ventricular hypertrophy. Left ventricular diastolic parameters are consistent with Grade II diastolic dysfunction (pseudonormalization). Elevated left ventricular end-diastolic pressure. The E/e' is 16. Right Ventricle: The right ventricular size is normal. No increase in right ventricular wall thickness. Right ventricular systolic function is normal. Tricuspid regurgitation signal is inadequate for assessing PA pressure. Left Atrium: Left atrial size was moderately dilated. Right Atrium: Right atrial size was normal in size. Pericardium: There is no evidence of pericardial effusion. Mitral Valve: The mitral valve is  abnormal. There is mild thickening of the anterior and posterior mitral valve leaflet(s).  Moderate mitral valve regurgitation, with centrally-directed jet. Tricuspid Valve: The tricuspid valve is grossly normal. Tricuspid valve regurgitation is trivial. Aortic Valve: The aortic valve is tricuspid. Aortic valve regurgitation is not visualized. Pulmonic Valve: The pulmonic valve was grossly normal. Pulmonic valve regurgitation is trivial. Aorta: The aortic root and ascending aorta are structurally normal, with no evidence of dilitation. Venous: The inferior vena cava is normal in size with greater than 50% respiratory variability, suggesting right atrial pressure of 3 mmHg. IAS/Shunts: No atrial level shunt detected by color flow Doppler.  LEFT VENTRICLE PLAX 2D                        Biplane EF (MOD) LVIDd:         5.70 cm         LV Biplane EF:   Left LVIDs:         4.90 cm                          ventricular LV PW:         1.30 cm                          ejection LV IVS:        1.10 cm                          fraction by LVOT diam:     1.90 cm                          2D MOD LV SV:         37                               biplane is LV SV Index:   19                               29.6 %. LVOT Area:     2.84 cm?                                Diastology                                LV e' medial:    5.00 cm/s LV Volumes (MOD)               LV E/e' medial:  15.4 LV vol d, MOD    127.0 ml      LV e' lateral:   4.35 cm/s A2C:                           LV E/e' lateral: 17.7 LV vol d, MOD    118.0 ml A4C: LV vol s, MOD    85.9 ml A2C: LV vol s, MOD    86.8 ml A4C: LV SV MOD A2C:   41.1 ml LV SV MOD A4C:   118.0 ml LV SV MOD BP:    36.7 ml RIGHT VENTRICLE  IVC RV S prime:     12.90 cm/s  IVC diam: 1.50 cm TAPSE (M-mode): 2.1 cm LEFT ATRIUM             Index        RIGHT ATRIUM           Index LA diam:        4.60 cm 2.35 cm/m?   RA Area:     16.10 cm? LA Vol (A2C):   66.6 ml 34.08 ml/m?  RA Volume:   41.30  ml  21.13 ml/m? LA Vol (A4C):   78.3 ml 40.07 ml/m? LA Biplane Vol: 72.2 ml 36.95 ml/m?  AORTIC VALVE LVOT Vmax:   75.90 cm/s LVOT Vmean:  47.700 cm/s LVOT VTI:    0.132 m  AORTA Ao Root diam: 3.10 cm A

## 2022-01-08 NOTE — Consult Note (Signed)
CARDIOLOGY CONSULT NOTE  ? ? ? ? ? ?Patient ID: ?Joseph Patrick ?MRN: [id number] ?DOB/AGE: 05-01-1946 76 y.o. ? ?Admit date: 01/06/2022 ?Referring Physician: Christiana Fuchs DO  ?Primary Physician: Patient, No Pcp Per (Inactive) ?Primary Cardiologist: New ?Reason for Consultation: DCM/HTN/CVA ? ?Principal Problem: ?  Cerebral infarction [facility]) ?Active Problems: ?  Hypertension ?  Hyperlipidemia ?  Diabetes ([place]) ?  Stage 3a chronic kidney disease (CKD) ([place]) ?  Heart failure with reduced ejection fraction ([place]) ? ? ?HPI:  76 y.o. admitted to [facility] 01/06/22 with CVA. History of essentially unRx DM and HTN. Had RLE weakness now resolved with left frontoparietal infarct ? Acute and ? Chronic right parietal infarct. He has had significant HTN but not Rx When EMS saw him systolic BP [id number] mmHg. Denies chest pain dyspnea palpitations edema. No drug/alcohol use. ECG with LVH NSR TTE reviewed and global hypokinesis apical trabeculations but no thrombus moderate MR.  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 Has sister in same apartment building and multiple siblings in [place] but lives along  ? ?ROS ?All other systems reviewed and negative except as noted above ? ?Past Medical History:  ?Diagnosis Date  ? Hypertension   ?  ?Family History  ?Problem Relation Age of Onset  ? Heart failure Sister   ?  ?Social History  ? ?Socioeconomic History  ? Marital status: Single  ?  Spouse name: Not on file  ? Number of children: Not on file  ? Years of education: Not on file  ? Highest education level: Not on file  ?Occupational History  ? Not on file  ?Tobacco Use  ? Smoking status: Never  ? Smokeless tobacco: Never  ?Vaping Use  ? Vaping Use: Never used  ?Substance and Sexual Activity  ? Alcohol use: Never  ? Drug use: Never  ? Sexual activity: Not on file  ?Other Topics Concern  ? Not on file  ?Social History Narrative  ? Not on file  ? ?Social Determinants of Health  ? ?Financial Resource Strain: Not  on file  ?Food Insecurity: Not on file  ?Transportation Needs: Not on file  ?Physical Activity: Not on file  ?Stress: Not on file  ?Social Connections: Not on file  ?Intimate Partner Violence: Not on file  ?  ?History reviewed. No pertinent surgical history.  ? ? ?Current Facility-Administered Medications:  ?  [START ON 01/09/2022] aspirin EC tablet 81 mg, 81 mg, Oral, Daily, Masters, Katie, DO ?  atorvastatin (LIPITOR) tablet 80 mg, 80 mg, Oral, QPM, Beulah Gandy A, NP, 80 mg at 01/07/22 1802 ?  clopidogrel (PLAVIX) tablet 75 mg, 75 mg, Oral, Daily, Masters, Katie, DO, 75 mg at 01/08/22 1517 ?  enoxaparin (LOVENOX) injection 40 mg, 40 mg, Subcutaneous, Q24H, Vincent, Mallie Mussel, MD ?  insulin aspart (novoLOG) injection 0-6 Units, 0-6 Units, Subcutaneous, TID WC, Katsadouros, Vasilios, MD, 1 Units at 01/08/22 1214 ?  [START ON 01/09/2022] losartan (COZAAR) tablet 50 mg, 50 mg, Oral, Daily, Masters, Katie, DO ?  metoprolol tartrate (LOPRESSOR) tablet 12.5 mg, 12.5 mg, Oral, BID, Masters, Katie, DO, 12.5 mg at 01/08/22 1213 ? [START ON 01/09/2022] aspirin EC  81 mg Oral Daily  ? atorvastatin  80 mg Oral QPM  ? clopidogrel  75 mg Oral Daily  ? enoxaparin (LOVENOX) injection  40 mg Subcutaneous Q24H  ? insulin aspart  0-6 Units Subcutaneous TID WC  ? [START ON 01/09/2022] losartan  50 mg Oral Daily  ?  metoprolol tartrate  12.5 mg Oral BID  ? ? ? ?Physical Exam: ?Blood pressure (!) 194/109, pulse 79, temperature 98.2 ?F (36.8 ?C), temperature source Oral, resp. rate 16, height 5\' 8"  (1.727 m), weight 81.6 kg, SpO2 99 %.  ? ?Affect appropriate ?Healthy:  appears stated age ?HEENT: normal ?Neck supple with no adenopathy ?JVP normal no bruits no thyromegaly ?Lungs clear with no wheezing and good diaphragmatic motion ?Heart:  S1/S2 Apical MR  murmur, no rub, gallop or click ?PMI  enlarged  ?Abdomen: benighn, BS positve, no tenderness, no AAA ?no bruit.  No HSM or HJR ?Distal pulses intact with no bruits ?No edema ?Neuro  non-focal ?Skin warm and dry ?No muscular weakness ? ?Labs: ?  ?Lab Results  ?Component Value Date  ? WBC 6.4 01/06/2022  ? HGB 15.3 01/06/2022  ? HCT 45.0 01/06/2022  ? MCV 90.8 01/06/2022  ? PLT 177 01/06/2022  ?  ?Recent Labs  ?Lab 01/06/22 ?1206 01/06/22 ?1240 01/08/22 ?0032  ?NA  --    < > 140  ?K  --    < > 3.8  ?CL  --    < > 106  ?CO2  --    < > 28  ?BUN  --    < > 13  ?CREATININE  --    < > 1.67*  ?CALCIUM  --    < > 9.0  ?PROT 7.6  --   --   ?BILITOT 0.9  --   --   ?ALKPHOS 67  --   --   ?ALT 19  --   --   ?AST 20  --   --   ?GLUCOSE  --    < > 130*  ? < > = values in this interval not displayed.  ? ?No results found for: CKTOTAL, CKMB, CKMBINDEX, TROPONINI  ?Lab Results  ?Component Value Date  ? CHOL 186 01/07/2022  ? CHOL 194 03/28/2020  ? ?Lab Results  ?Component Value Date  ? HDL 43 01/07/2022  ? HDL 40 03/28/2020  ? ?Lab Results  ?Component Value Date  ? LDLCALC 127 (H) 01/07/2022  ? LDLCALC 120 (H) 03/28/2020  ? ?Lab Results  ?Component Value Date  ? TRIG 82 01/07/2022  ? TRIG 195 (H) 03/28/2020  ? ?Lab Results  ?Component Value Date  ? CHOLHDL 4.3 01/07/2022  ? CHOLHDL 4.9 03/28/2020  ? ?No results found for: LDLDIRECT  ?  ?Radiology: ?CT ANGIO HEAD NECK W WO CM ? ?Result Date: 01/06/2022 ?CLINICAL DATA:  Provided history: Stroke.  Hypertension, stroke. EXAM: CT ANGIOGRAPHY HEAD AND NECK TECHNIQUE: Multidetector CT imaging of the head and neck was performed using the standard protocol during bolus administration of intravenous contrast. Multiplanar CT image reconstructions and MIPs were obtained to evaluate the vascular anatomy. Carotid stenosis measurements (when applicable) are obtained utilizing NASCET criteria, using the distal internal carotid diameter as the denominator. RADIATION DOSE REDUCTION: This exam was performed according to the departmental dose-optimization program which includes automated exposure control, adjustment of the mA and/or kV according to patient size and/or use of  iterative reconstruction technique. CONTRAST:  12mL OMNIPAQUE IOHEXOL 350 MG/ML SOLN COMPARISON:  Brain MRI 01/06/2022.  Head CT 01/06/2022. FINDINGS: CTA NECK FINDINGS Aortic arch: Standard aortic branching. The visualized aortic arch is normal in caliber. Streak and beam hardening artifact arising from a dense left-sided contrast bolus partially obscures the left subclavian artery. Within this limitation, there is no appreciable hemodynamically significant innominate or proximal subclavian artery stenosis. Right  carotid system: CCA and ICA patent within the neck without stenosis. Minimal soft plaque about the carotid bifurcation and within the proximal ICA. Left carotid system: CCA and ICA patent within the neck without stenosis or significant atherosclerotic disease. Vertebral arteries: The arteries patent within the neck. Streak and beam hardening artifact arising from a dense left-sided contrast bolus limits evaluation of the proximal V1 left vertebral artery. No appreciable significant stenosis within the cervical vertebral arteries. Skeleton: Cervical spondylosis. No acute bony abnormality or aggressive osseous lesion. Other neck: Subcentimeter nodule within the right thyroid lobe, not meeting consensus criteria for ultrasound follow-up based on size. 8 mm enhancing nodule within the right parotid gland. Upper chest: Incompletely imaged bilateral pleural effusions. Ground-glass opacity within the imaged lung apices, likely reflecting edema. Review of the MIP images confirms the above findings CTA HEAD FINDINGS Anterior circulation: The intracranial internal carotid arteries are patent. The M1 middle cerebral arteries are patent. Atherosclerotic irregularity of the M2 and more distal MCA vessels, bilaterally. Most notably, there is a moderate/severe focal stenosis within a proximal to mid M2 left MCA vessel (series 12, image 29). The anterior cerebral arteries are patent. No intracranial aneurysm is  identified. Posterior circulation: The intracranial vertebral arteries are patent. The basilar artery is patent. The posterior cerebral arteries are patent. The basilar artery is developmentally diminutive. The bilateral

## 2022-01-08 NOTE — Progress Notes (Signed)
OT Cancellation Note ? ?Patient Details ?Name: Joseph Patrick ?MRN: [id number] ?DOB: 1946-07-28 ? ? ?Cancelled Treatment:    Reason Eval/Treat Not Completed: OT screened, no needs identified, will sign off (discussed with PT) ? ?Terea Neubauer,HILLARY ?01/08/2022, 11:15 AM ?Maurie Boettcher, OT/L  ? ?Acute OT Clinical Specialist ?Acute Rehabilitation Services ?Pager [phone] ?Office [phone]  ?

## 2022-01-08 NOTE — Hospital Course (Addendum)
Acute CVA ?Patient presented after episode of right leg numbness and tingling.  On arrival his blood pressures were elevated into the 260s over 120s. MRI with subtle left frontal lobe parietal white matter lesion.  There is also a 6 mm focus of signal loss within the right parietal lobe suggesting small chronic cortical infarct.  Another subcentimeter chronic infarcts was seen within the left cerebellar hemisphere.  Distribution of stroke not consistent with symptoms.  his neurological deficits had resolved by the time they arrived to the hospital.  He was evaluated by neurology and DAPT recommended.  He will remain on Plavix for 3 weeks until May 15 and aspirin 81 mg indefinitely.  He will need to follow-up with neurology. ? ?Diagnosis of HFrEF ?Valvulopathy-mitral regurgitation ?TTE completed 4 stroke evaluation.  This showed coarse apical trabeculation with left ventricular ejection fraction at 25 to 30%.  The left ventricle has severely decreased function with global hypokinesis with mild left ventricular hypertrophy.  Moderate mitral regurgitation present.  He was evaluated by cardiology.  It is likely that his heart failure secondary to uncontrolled hypertensive heart disease.  He really will need to be evaluated outpatient for ischemic disease.  Goal-directed medical therapy was initiated with isosorbide-hydralazine, Entresto, metoprol tartrate, spironolactone, and dapagliflozin. ? ?Uncontrolled hypertension ?Patient presented with blood pressure is elevated to the 260s over 110s.  He reported discontinuing home blood pressure medication due to changing his diet and thinking that he did not need this anymore.  He was found to have heart failure with reduced ejection fraction and started on goal-directed medical therapy including isosorbide-hydralazine.  He will follow-up with cardiology and they will likely add additional mineralocorticoid antagonist to GDMT regimen. ? ?CKD stage IIIa ?Baseline creatinine of  1.35-1.4.  Creatinine at 1.5 on day of discharge.  He is tolerating addition of goal-directed medical therapy for heart failure. ? ?Type 2 diabetes ?Hemoglobin A1c at 7.1.  He previously was on metformin but discontinued this medication due to dietary modification.  Dapagliflozin was started for goal-directed medical therapy.  He could also benefit from addition of metformin if he needs additional control of his diabetes. ? ?Parotid nodule ?CT head and neck showed 8 mm within the right parotid gland.  HIV testing was nonreactive. ? ?

## 2022-01-08 NOTE — Evaluation (Signed)
Physical Therapy Evaluation ?Patient Details ?Name: Joseph Patrick ?MRN: [id number] ?DOB: 09/11/1946 ?Today's Date: 01/08/2022 ? ?History of Present Illness ? pt is a 76 y/o male admitted with R LE numbness/weakness.  MRI shows a small acute infarct within the left frontal parietal area  PMHx  HTN, HLD, DM, stage 3a CKD.  ?Clinical Impression ? Pt is at baseline functioning and should be safe at home Indepedently.  Discussed discovering all his personal risk factors and work on moderating.  Discussed s/s of stroke.  BE FAST, pt/niece verbalized understanding. There are no further acute PT needs.  Will sign off at this time. ?   ?   ? ?Recommendations for follow up therapy are one component of a multi-disciplinary discharge planning process, led by the attending physician.  Recommendations may be updated based on patient status, additional functional criteria and insurance authorization. ? ?Follow Up Recommendations No PT follow up ? ?  ?Assistance Recommended at Discharge None  ?Patient can return home with the following ?   ? ?  ?Equipment Recommendations    ?Recommendations for Other Services ?    ?  ?Functional Status Assessment Patient has had a recent decline in their functional status and demonstrates the ability to make significant improvements in function in a reasonable and predictable amount of time.  ? ?  ?Precautions / Restrictions Precautions ?Precautions: Fall  ? ?  ? ?Mobility ? Bed Mobility ?Overal bed mobility: Independent ?  ?  ?  ?  ?  ?  ?  ?  ? ?Transfers ?Overall transfer level: Independent ?  ?  ?  ?  ?  ?  ?  ?  ?  ?  ? ?Ambulation/Gait ?Ambulation/Gait assistance: Independent ?Gait Distance (Feet): 800 Feet ?Assistive device: None ?Gait Pattern/deviations: WFL(Within Functional Limits) ?  ?Gait velocity interpretation: >4.37 ft/sec, indicative of normal walking speed ?  ?General Gait Details: no deviation to balance challenge, age appropriate gait speed ? ?Stairs ?Stairs: Yes ?Stairs assistance:  Independent ?Stair Management: No rails, Alternating pattern, Forwards ?Number of Stairs: 10 ?  ? ?Wheelchair Mobility ?  ? ?Modified Rankin (Stroke Patients Only) ?Modified Rankin (Stroke Patients Only) ?Pre-Morbid Rankin Score: No symptoms ?Modified Rankin: No symptoms ? ?  ? ?Balance Overall balance assessment: Independent ?  ?  ?  ?  ?  ?  ?  ?  ?  ?  ?  ?  ?  ?  ?  ?Standardized Balance Assessment ?Standardized Balance Assessment : Dynamic Gait Index ?  ?Dynamic Gait Index ?Level Surface: Normal ?Change in Gait Speed: Normal ?Gait with Horizontal Head Turns: Normal ?Gait with Vertical Head Turns: Normal ?Gait and Pivot Turn: Normal ?Step Over Obstacle: Normal ?Step Around Obstacles: Normal ?Steps: Normal ?Total Score: 24 ?   ? ? ? ?Pertinent Vitals/Pain Pain Assessment ?Pain Assessment: No/denies pain  ? ? ?Home Living Family/patient expects to be discharged to:: Private residence ?Living Arrangements: Alone ?Available Help at Discharge: Available PRN/intermittently ?Type of Home: Apartment ?Home Access: Elevator ?  ?  ?  ?Home Layout: One level ?Home Equipment: None ?   ?  ?Prior Function Prior Level of Function : Independent/Modified Independent;Driving ?  ?  ?  ?  ?  ?  ?  ?  ?  ? ? ?Hand Dominance  ?   ? ?  ?Extremity/Trunk Assessment  ? Upper Extremity Assessment ?Upper Extremity Assessment: Overall WFL for tasks assessed ?  ? ?Lower Extremity Assessment ?Lower Extremity Assessment: Overall WFL for tasks assessed ?  ? ?  Cervical / Trunk Assessment ?Cervical / Trunk Assessment: Normal  ?Communication  ? Communication: No difficulties  ?Cognition Arousal/Alertness: Awake/alert ?Behavior During Therapy: [facility] for tasks assessed/performed ?Overall Cognitive Status: Within Functional Limits for tasks assessed ?  ?  ?  ?  ?  ?  ?  ?  ?  ?  ?  ?  ?  ?  ?  ?  ?  ?  ?  ? ?  ?General Comments General comments (skin integrity, edema, etc.): BP 185/ upper 90's ? ?  ?Exercises    ? ?Assessment/Plan  ?  ?PT Assessment  Patient does not need any further PT services  ?PT Problem List   ? ?   ?  ?PT Treatment Interventions     ? ?PT Goals (Current goals can be found in the Care Plan section)  ?Acute Rehab PT Goals ?PT Goal Formulation: All assessment and education complete, DC therapy ? ?  ?Frequency   ?  ? ? ?Co-evaluation   ?  ?  ?  ?  ? ? ?  ?AM-PAC PT "6 Clicks" Mobility  ?Outcome Measure Help needed turning from your back to your side while in a flat bed without using bedrails?: None ?Help needed moving from lying on your back to sitting on the side of a flat bed without using bedrails?: None ?Help needed moving to and from a bed to a chair (including a wheelchair)?: None ?Help needed standing up from a chair using your arms (e.g., wheelchair or bedside chair)?: None ?Help needed to walk in hospital room?: None ?Help needed climbing 3-5 steps with a railing? : None ?6 Click Score: 24 ? ?  ?End of Session   ?Activity Tolerance: Patient tolerated treatment well ?Patient left: in bed;with call bell/phone within reach ?Nurse Communication: Mobility status ?  ?  ? ?Time: [id number] ?PT Time Calculation (min) (ACUTE ONLY): 25 min ? ? ?Charges:   PT Evaluation ?$PT Eval Low Complexity: 1 Low ?PT Treatments ?$Gait Training: 8-22 mins ?  ?   ? ? ?01/08/2022 ? ?Joseph Patrick., PT ?Acute Rehabilitation Services ?[phone]  (pager) ?[phone]  (office) ? ?Joseph Patrick ?01/08/2022, 11:08 AM ? ?

## 2022-01-09 DIAGNOSIS — R29700 NIHSS score 0: Secondary | ICD-10-CM | POA: Diagnosis present

## 2022-01-09 DIAGNOSIS — I639 Cerebral infarction, unspecified: Secondary | ICD-10-CM | POA: Diagnosis present

## 2022-01-09 DIAGNOSIS — Z79899 Other long term (current) drug therapy: Secondary | ICD-10-CM | POA: Diagnosis not present

## 2022-01-09 DIAGNOSIS — I130 Hypertensive heart and chronic kidney disease with heart failure and stage 1 through stage 4 chronic kidney disease, or unspecified chronic kidney disease: Secondary | ICD-10-CM | POA: Diagnosis present

## 2022-01-09 DIAGNOSIS — Z20822 Contact with and (suspected) exposure to covid-19: Secondary | ICD-10-CM | POA: Diagnosis present

## 2022-01-09 DIAGNOSIS — G8191 Hemiplegia, unspecified affecting right dominant side: Secondary | ICD-10-CM | POA: Diagnosis present

## 2022-01-09 DIAGNOSIS — I420 Dilated cardiomyopathy: Secondary | ICD-10-CM | POA: Diagnosis present

## 2022-01-09 DIAGNOSIS — Z8249 Family history of ischemic heart disease and other diseases of the circulatory system: Secondary | ICD-10-CM | POA: Diagnosis not present

## 2022-01-09 DIAGNOSIS — Z833 Family history of diabetes mellitus: Secondary | ICD-10-CM | POA: Diagnosis not present

## 2022-01-09 DIAGNOSIS — I10 Essential (primary) hypertension: Secondary | ICD-10-CM | POA: Diagnosis not present

## 2022-01-09 DIAGNOSIS — I6300 Cerebral infarction due to thrombosis of unspecified precerebral artery: Secondary | ICD-10-CM | POA: Diagnosis not present

## 2022-01-09 DIAGNOSIS — E1122 Type 2 diabetes mellitus with diabetic chronic kidney disease: Secondary | ICD-10-CM | POA: Diagnosis present

## 2022-01-09 DIAGNOSIS — G459 Transient cerebral ischemic attack, unspecified: Secondary | ICD-10-CM | POA: Diagnosis present

## 2022-01-09 DIAGNOSIS — Z7984 Long term (current) use of oral hypoglycemic drugs: Secondary | ICD-10-CM | POA: Diagnosis not present

## 2022-01-09 DIAGNOSIS — I5020 Unspecified systolic (congestive) heart failure: Secondary | ICD-10-CM | POA: Diagnosis not present

## 2022-01-09 DIAGNOSIS — I5022 Chronic systolic (congestive) heart failure: Secondary | ICD-10-CM | POA: Diagnosis present

## 2022-01-09 DIAGNOSIS — E785 Hyperlipidemia, unspecified: Secondary | ICD-10-CM | POA: Diagnosis present

## 2022-01-09 DIAGNOSIS — I081 Rheumatic disorders of both mitral and tricuspid valves: Secondary | ICD-10-CM | POA: Diagnosis present

## 2022-01-09 DIAGNOSIS — Z91148 Patient's other noncompliance with medication regimen for other reason: Secondary | ICD-10-CM | POA: Diagnosis not present

## 2022-01-09 DIAGNOSIS — N1831 Chronic kidney disease, stage 3a: Secondary | ICD-10-CM | POA: Diagnosis present

## 2022-01-09 DIAGNOSIS — I6381 Other cerebral infarction due to occlusion or stenosis of small artery: Secondary | ICD-10-CM | POA: Diagnosis present

## 2022-01-09 LAB — URINE CULTURE: Culture: >=100000 — AB

## 2022-01-09 LAB — BASIC METABOLIC PANEL
Anion gap: 7 (ref 5–15)
BUN: 18 mg/dL (ref 8–23)
CO2: 27 mmol/L (ref 22–32)
Calcium: 9 mg/dL (ref 8.9–10.3)
Chloride: 104 mmol/L (ref 98–111)
Creatinine, Ser: 1.48 mg/dL — ABNORMAL HIGH (ref 0.61–1.24)
GFR, Estimated: 49 mL/min — ABNORMAL LOW (ref >60)
Glucose, Bld: 134 mg/dL — ABNORMAL HIGH (ref 70–99)
Potassium: 4 mmol/L (ref 3.5–5.1)
Sodium: 138 mmol/L (ref 135–145)

## 2022-01-09 LAB — HIV ANTIBODY (ROUTINE TESTING W REFLEX): HIV Screen 4th Generation wRfx: NONREACTIVE

## 2022-01-09 LAB — GLUCOSE, CAPILLARY
Glucose-Capillary: 119 mg/dL — ABNORMAL HIGH (ref 70–99)
Glucose-Capillary: 129 mg/dL — ABNORMAL HIGH (ref 70–99)
Glucose-Capillary: 190 mg/dL — ABNORMAL HIGH (ref 70–99)
Glucose-Capillary: 181 mg/dL — ABNORMAL HIGH (ref 70–99)
Glucose-Capillary: 145 mg/dL — ABNORMAL HIGH (ref 70–99)

## 2022-01-09 LAB — HEMOGLOBIN A1C
Hgb A1c MFr Bld: 7.1 % — ABNORMAL HIGH (ref 4.8–5.6)
Mean Plasma Glucose: 157.07 mg/dL

## 2022-01-09 MED ORDER — EMPAGLIFLOZIN 10 MG PO TABS
10.0000 mg | ORAL_TABLET | Freq: Every day | ORAL | Status: DC
  Administered 2022-01-10: 10 mg via ORAL
  Filled 2022-01-09 (×2): qty 1

## 2022-01-09 MED ORDER — SPIRONOLACTONE 12.5 MG HALF TABLET: 12.5000 mg | ORAL_TABLET | Freq: Every day | ORAL | Status: DC

## 2022-01-09 NOTE — Progress Notes (Signed)
?   01/09/22 1420  ?Clinical Encounter Type  ?Visited With Patient and family together  ?Visit Type Initial;Other (Comment) ?(Advanced Directive)  ?Referral From Nurse  ?Consult/Referral To Chaplain  ? ?Chaplain responded a spiritual consult for an advanced directive.. Document is ready to be notarized and is schedule for 1300 signing.  ? ?Valerie Roys ?Chaplain  ?[facility]  ?[phone] ? ?

## 2022-01-09 NOTE — Discharge Summary (Addendum)
? ?Name: Joseph Patrick ?MRN: [id number] ?DOB: 03/25/1946 76 y.o. ?PCP: Arthur Holms, NP ? ?Date of Admission: 01/06/2022  7:53 AM ?Date of Discharge: 01/10/22 ?Attending Physician: Dr. Evette Doffing ? ?Discharge Diagnosis: ?Principal Problem: ?  Transient cerebral ischemia ?Active Problems: ?  Hypertension ?  Hyperlipidemia ?  Diabetes ([place]) ?  Stage 3a chronic kidney disease (CKD) ([place]) ?  Heart failure with reduced ejection fraction ([place]) ?  ? ?Discharge Medications: ?Allergies as of 01/10/2022   ?No Known Allergies ?  ? ?  ?Medication List  ?  ? ?STOP taking these medications   ? ?amLODipine 5 MG tablet ?Commonly known as: NORVASC ?  ?furosemide 20 MG tablet ?Commonly known as: LASIX ?  ?metFORMIN 500 MG tablet ?Commonly known as: GLUCOPHAGE ?  ?rosuvastatin 10 MG tablet ?Commonly known as: CRESTOR ?  ? ?  ? ?TAKE these medications   ? ?Aspirin Low Dose 81 MG EC tablet ?Generic drug: aspirin ?Take 1 tablet (81 mg total) by mouth daily. Swallow whole. ?Start taking on: January 11, 2022 ?  ?atorvastatin 80 MG tablet ?Commonly known as: LIPITOR ?Take 1 tablet (80 mg total) by mouth daily. ?  ?clopidogrel 75 MG tablet ?Commonly known as: PLAVIX ?Take 1 tablet (75 mg total) by mouth daily for 18 days. ?Start taking on: January 11, 2022 ?  ?Entresto 24-26 MG ?Generic drug: sacubitril-valsartan ?Take 1 tablet by mouth 2 (two) times daily. ?  ?Farxiga 5 MG Tabs tablet ?Generic drug: dapagliflozin propanediol ?Take 1 tablet (5 mg total) by mouth daily before breakfast. ?  ?isosorbide-hydrALAZINE 20-37.5 MG tablet ?Commonly known as: BIDIL ?Take 1 tablet by mouth 3 (three) times daily. ?  ?metoprolol tartrate 50 MG tablet ?Commonly known as: LOPRESSOR ?Take 1 tablet (50 mg total) by mouth 2 (two) times daily. ?  ? ?  ? ? ?Disposition and follow-up:   ?Mr.Joseph Patrick was discharged from [facility] in Stable condition.  At the hospital follow up visit please address: ? ?1.  Follow-up: ?a.  Cerebral Ischemia: follow-up  with neurology in 8 weeks.  He was started on atorvastatin, aspirin, and Plavix. ?  ?b.  Heart failure with reduced ejection fraction-EF of 25 to 30% with moderate functional mitral regurgitation.  He was seen by cardiology with plans to repeat echo in 3 months.  Goal-directed medical therapy was initiated with isosorbide-hydralazine, metoprolol tartrate, Entresto, and dapagliflozin. ? ?c. Parotid gland nodule- Ct head and neck showed 8 mm enhancing nodule within the right parotid gland. HIV testing was non-reactive. ? ?2.  Labs / imaging needed at time of follow-up: BMP ? ?3.  Pending labs/ test needing follow-up: none ? ?4.  Medication Changes ? Started: ? 1.  Aspirin 81 mg daily indefinitely ? 2.  Plavix 75 mg until May 15 ? 3.  Atorvastatin 80 mg daily ? 4.  Isosorbide-hydralazine 20-37.5 mg 3 times daily ? 5.  Metoprolol tartrate 50 mg twice daily ? 6.  Entresto 24-26 mg twice daily ? 7.  Dapagliflozin 5 mg  ? ?Follow-up Appointments: ?PCP ?Cardiology, Dr.Nishan. ?[facility] in 8 weeks ? ?Hospital Course by problem list: ?Transient Cerebral Ischemia ?Patient presented after episode of right leg numbness and tingling.  On arrival his blood pressures were elevated into the 260s over 120s. MRI with subtle left frontal lobe parietal white matter lesion that may have been artifact.  There is also a 6 mm focus of signal loss within the right parietal lobe suggesting small chronic cortical infarct.  Another  subcentimeter chronic infarcts was seen within the left cerebellar hemisphere.  Distribution of stroke not consistent with symptoms.  His neurological deficits had resolved by the time they arrived to the hospital.  He was evaluated by neurology and period of DAPT recommended for medical management of transient cerebral ischemia/TIA.  He will remain on Plavix for 3 weeks until May 15 and aspirin 81 mg indefinitely.  He will need to follow-up with neurology. ? ?New Diagnosis of likely chronic  HFrEF ?Valvulopathy-mitral regurgitation ?TTE completed 4 stroke evaluation.  This showed coarse apical trabeculation with left ventricular ejection fraction at 25 to 30%.  The left ventricle has severely decreased function with global hypokinesis with mild left ventricular hypertrophy.  Moderate mitral regurgitation present.  He was evaluated by cardiology.  It is likely that his heart failure secondary to uncontrolled hypertensive heart disease.  He really will need to be evaluated outpatient for ischemic disease.  Goal-directed medical therapy was initiated with isosorbide-hydralazine, Entresto, metoprol tartrate, spironolactone, and dapagliflozin. ? ?Uncontrolled hypertension ?Patient presented with blood pressure is elevated to the 260s over 110s.  He reported discontinuing home blood pressure medication due to changing his diet and thinking that he did not need this anymore.  He was found to have heart failure with reduced ejection fraction and started on goal-directed medical therapy including isosorbide-hydralazine.  He will follow-up with cardiology and they will likely add additional mineralocorticoid antagonist to GDMT regimen. ? ?CKD stage IIIa ?Baseline creatinine of 1.35-1.4.  Creatinine at 1.5 on day of discharge.  He is tolerating addition of goal-directed medical therapy for heart failure. ? ?Type 2 diabetes ?Hemoglobin A1c at 7.1.  He previously was on metformin but discontinued this medication due to dietary modification.  Dapagliflozin was started for goal-directed medical therapy.  He could also benefit from addition of metformin if he needs additional control of his diabetes. ? ?Parotid nodule ?CT head and neck showed 8 mm within the right parotid gland.  HIV testing was nonreactive. ?  ? ?Discharge Subjective: ?Patient assessed at bedside this AM.  We talked about medications that he would be starting and having these delivered to bedside so we would have them prior to discharge.  We talked  about importance of taking medications to help his heart recover and that he would have a repeat ultrasound of his heart in about 3 months.  He endorsed understanding and need to follow-up with his primary care physician, neurology, and cardiology.  No other concerns at this time. ? ?Discharge Exam:   ?BP (!) 177/104 (BP Location: Left Arm)   Pulse 75   Temp 97.8 ?F (36.6 ?C) (Oral)   Resp 17   Ht 5\' 8"  (1.727 m)   Wt 79 kg   SpO2 97%   BMI 26.48 kg/m?  ?Constitutional: well-appearing ?HENT: normocephalic, mucous membranes moist ?Neck: supple, no JVD ?Cardiovascular: regular rate and rhythm, no m/r/g ?Pulmonary/Chest: normal work of breathing on room air, lungs clear to auscultation bilaterally ?MSK: normal bulk and tone, peripheral edema in lower extremities bilaterally ?Neurological: alert & oriented x 3, normal gait ?Skin: warm and dry ?Psych: Normal mood and affect ? ?Pertinent Labs, Studies, and Procedures:  ? ?  Latest Ref Rng & Units 01/06/2022  ? 12:40 PM 01/06/2022  ?  8:13 AM 02/19/2020  ? 10:20 AM  ?CBC  ?WBC 4.0 - 10.5 K/uL  6.4   5.5    ?Hemoglobin 13.0 - 17.0 g/dL 15.3   15.5   15.7    ?Hematocrit  39.0 - 52.0 % 45.0   47.1   47.5    ?Platelets 150 - 400 K/uL  177   246    ? ? ? ?  Latest Ref Rng & Units 01/10/2022  ?  2:10 AM 01/09/2022  ?  3:13 AM 01/08/2022  ? 12:32 AM  ?CMP  ?Glucose 70 - 99 mg/dL 123   134   130    ?BUN 8 - 23 mg/dL 18   18   13     ?Creatinine 0.61 - 1.24 mg/dL 1.51   1.48   1.67    ?Sodium 135 - 145 mmol/L 139   138   140    ?Potassium 3.5 - 5.1 mmol/L 3.8   4.0   3.8    ?Chloride 98 - 111 mmol/L 105   104   106    ?CO2 22 - 32 mmol/L 28   27   28     ?Calcium 8.9 - 10.3 mg/dL 9.2   9.0   9.0    ? ? ?CT ANGIO HEAD NECK W WO CM ? ?Result Date: 01/06/2022 ?CLINICAL DATA:  Provided history: Stroke.  Hypertension, stroke. EXAM: CT ANGIOGRAPHY HEAD AND NECK TECHNIQUE: Multidetector CT imaging of the head and neck was performed using the standard protocol during bolus administration of  intravenous contrast. Multiplanar CT image reconstructions and MIPs were obtained to evaluate the vascular anatomy. Carotid stenosis measurements (when applicable) are obtained utilizing NASCET criteria, using the

## 2022-01-09 NOTE — Progress Notes (Signed)
? ?Cardiologist:  Kaelin Bonelli ? ?Subjective:  ?Denies SSCP, palpitations or Dyspnea ?No residual neuro deficits  ? ?Objective:  ?Vitals:  ? 01/08/22 2000 01/09/22 0000 01/09/22 0400 01/09/22 0500  ?BP: (!) 176/98 (!) 141/78 (!) 149/78   ?Pulse: 76 63 62   ?Resp:      ?Temp: 98.4 ?F (36.9 ?C) 98.2 ?F (36.8 ?C) 98.3 ?F (36.8 ?C)   ?TempSrc: Oral Oral Oral   ?SpO2: 98% 97% 98%   ?Weight:    79 kg  ?Height:      ? ? ?Intake/Output from previous day: ? ?Intake/Output Summary (Last 24 hours) at 01/09/2022 0824 ?Last data filed at 01/08/2022 1523 ?Gross per 24 hour  ?Intake --  ?Output 150 ml  ?Net -150 ml  ? ? ?Physical Exam: ?Affect appropriate ?Healthy:  appears stated age ?HEENT: normal ?Neck supple with no adenopathy ?JVP normal no bruits no thyromegaly ?Lungs clear with no wheezing and good diaphragmatic motion ?Heart:  S1/S2 apical MR murmur, no rub, gallop or click ?PMI enlarged  ?Abdomen: benighn, BS positve, no tenderness, no AAA ?no bruit.  No HSM or HJR ?Distal pulses intact with no bruits ?No edema ?Neuro non-focal ?Skin warm and dry ?No muscular weakness ? ? ?Lab Results: ?Basic Metabolic Panel: ?Recent Labs  ?  01/08/22 ?[id number] 01/09/22 ?[id number]  ?NA 140 138  ?K 3.8 4.0  ?CL 106 104  ?CO2 28 27  ?GLUCOSE 130* 134*  ?BUN 13 18  ?CREATININE 1.67* 1.48*  ?CALCIUM 9.0 9.0  ?MG 2.1  --   ? ?Liver Function Tests: ?Recent Labs  ?  01/06/22 ?1206  ?AST 20  ?ALT 19  ?ALKPHOS 67  ?BILITOT 0.9  ?PROT 7.6  ?ALBUMIN 3.6  ? ?No results for input(s): LIPASE, AMYLASE in the last 72 hours. ?CBC: ?Recent Labs  ?  01/06/22 ?1206 01/06/22 ?1240  ?NEUTROABS 4.8  --   ?HGB  --  15.3  ?HCT  --  45.0  ? ?Cardiac Enzymes: ?No results for input(s): CKTOTAL, CKMB, CKMBINDEX, TROPONINI in the last 72 hours. ?BNP: ?Invalid input(s): POCBNP ?D-Dimer: ?No results for input(s): DDIMER in the last 72 hours. ?Hemoglobin A1C: ?Recent Labs  ?  01/09/22 ?0313  ?HGBA1C 7.1*  ? ?Fasting Lipid Panel: ?Recent Labs  ?  01/07/22 ?[id number]  ?CHOL 186  ?HDL 43   ?LDLCALC 127*  ?TRIG 82  ?CHOLHDL 4.3  ? ?Thyroid Function Tests: ?No results for input(s): TSH, T4TOTAL, T3FREE, THYROIDAB in the last 72 hours. ? ?Invalid input(s): FREET3 ?Anemia Panel: ?No results for input(s): VITAMINB12, FOLATE, FERRITIN, TIBC, IRON, RETICCTPCT in the last 72 hours. ? ?Imaging: ?ECHOCARDIOGRAM COMPLETE ? ?Result Date: 01/07/2022 ?   ECHOCARDIOGRAM REPORT   Patient Name:   Joseph Patrick Date of Exam: 01/07/2022 Medical Rec #:  [id number]    Height:       68.0 in Accession #:    [id number]   Weight:       180.0 lb Date of Birth:  Jan 21, 1946   BSA:          1.954 m? Patient Age:    76 years     BP:           198/103 mmHg Patient Gender: M            HR:           88 bpm. Exam Location:  Inpatient Procedure: 2D Echo Indications:    stroke  History:        Patient has no prior history  of Echocardiogram examinations.                 Chronic kidney disease; Risk Factors:Hypertension, Diabetes and                 Dyslipidemia.  Sonographer:    Johny Chess RDCS Referring Phys: [place]  1. Coarse apical trabeculation, no obvious thrombus (non-contrast). Left ventricular ejection fraction, by estimation, is 25 to 30%. Left ventricular ejection fraction by 2D MOD biplane is 29.6 %. The left ventricle has severely decreased function. The left ventricle demonstrates global hypokinesis. There is mild left ventricular hypertrophy. Left ventricular diastolic parameters are consistent with Grade II diastolic dysfunction (pseudonormalization). Elevated left ventricular end-diastolic pressure. The E/e' is 5.  2. Right ventricular systolic function is normal. The right ventricular size is normal. Tricuspid regurgitation signal is inadequate for assessing PA pressure.  3. Left atrial size was moderately dilated.  4. The mitral valve is abnormal. Moderate mitral valve regurgitation.  5. The aortic valve is tricuspid. Aortic valve regurgitation is not visualized.  6. The inferior vena  cava is normal in size with greater than 50% respiratory variability, suggesting right atrial pressure of 3 mmHg. Comparison(s): No prior Echocardiogram. FINDINGS  Left Ventricle: Coarse apical trabeculation, no obvious thrombus (non-contrast). Left ventricular ejection fraction, by estimation, is 25 to 30%. Left ventricular ejection fraction by 2D MOD biplane is 29.6 %. The left ventricle has severely decreased function. The left ventricle demonstrates global hypokinesis. The left ventricular internal cavity size was normal in size. There is mild left ventricular hypertrophy. Left ventricular diastolic parameters are consistent with Grade II diastolic dysfunction (pseudonormalization). Elevated left ventricular end-diastolic pressure. The E/e' is 16. Right Ventricle: The right ventricular size is normal. No increase in right ventricular wall thickness. Right ventricular systolic function is normal. Tricuspid regurgitation signal is inadequate for assessing PA pressure. Left Atrium: Left atrial size was moderately dilated. Right Atrium: Right atrial size was normal in size. Pericardium: There is no evidence of pericardial effusion. Mitral Valve: The mitral valve is abnormal. There is mild thickening of the anterior and posterior mitral valve leaflet(s). Moderate mitral valve regurgitation, with centrally-directed jet. Tricuspid Valve: The tricuspid valve is grossly normal. Tricuspid valve regurgitation is trivial. Aortic Valve: The aortic valve is tricuspid. Aortic valve regurgitation is not visualized. Pulmonic Valve: The pulmonic valve was grossly normal. Pulmonic valve regurgitation is trivial. Aorta: The aortic root and ascending aorta are structurally normal, with no evidence of dilitation. Venous: The inferior vena cava is normal in size with greater than 50% respiratory variability, suggesting right atrial pressure of 3 mmHg. IAS/Shunts: No atrial level shunt detected by color flow Doppler.  LEFT VENTRICLE  PLAX 2D                        Biplane EF (MOD) LVIDd:         5.70 cm         LV Biplane EF:   Left LVIDs:         4.90 cm                          ventricular LV PW:         1.30 cm                          ejection LV IVS:  1.10 cm                          fraction by LVOT diam:     1.90 cm                          2D MOD LV SV:         37                               biplane is LV SV Index:   19                               29.6 %. LVOT Area:     2.84 cm?                                Diastology                                LV e' medial:    5.00 cm/s LV Volumes (MOD)               LV E/e' medial:  15.4 LV vol d, MOD    127.0 ml      LV e' lateral:   4.35 cm/s A2C:                           LV E/e' lateral: 17.7 LV vol d, MOD    118.0 ml A4C: LV vol s, MOD    85.9 ml A2C: LV vol s, MOD    86.8 ml A4C: LV SV MOD A2C:   41.1 ml LV SV MOD A4C:   118.0 ml LV SV MOD BP:    36.7 ml RIGHT VENTRICLE             IVC RV S prime:     12.90 cm/s  IVC diam: 1.50 cm TAPSE (M-mode): 2.1 cm LEFT ATRIUM             Index        RIGHT ATRIUM           Index LA diam:        4.60 cm 2.35 cm/m?   RA Area:     16.10 cm? LA Vol (A2C):   66.6 ml 34.08 ml/m?  RA Volume:   41.30 ml  21.13 ml/m? LA Vol (A4C):   78.3 ml 40.07 ml/m? LA Biplane Vol: 72.2 ml 36.95 ml/m?  AORTIC VALVE LVOT Vmax:   75.90 cm/s LVOT Vmean:  47.700 cm/s LVOT VTI:    0.132 m  AORTA Ao Root diam: 3.10 cm Ao Asc diam:  2.80 cm MITRAL VALVE MV Area (PHT): 5.27 cm?       SHUNTS MV Decel Time: 144 msec       Systemic VTI:  0.13 m MR Peak grad:    162.8 mmHg   Systemic Diam: 1.90 cm MR Mean grad:    104.0 mmHg MR Vmax:         638.00 cm/s MR Vmean:        480.0 cm/s MR PISA:         0.57 cm?  MR PISA Eff ROA: 3 mm? MR PISA Radius:  0.30 cm MV E velocity: 77.10 cm/s MV A velocity: 59.50 cm/s MV E/A ratio:  1.30 Lyman Bishop MD Electronically signed by Lyman Bishop MD Signature Date/Time: 01/07/2022/4:24:40 PM    Final    ? ?Cardiac Studies: ? ECG: NSR  LVH ? ? Telemetry: ? Echo: EF 25-30% moderate MR  ? ?Medications: ?  ? aspirin EC  81 mg Oral Daily  ? atorvastatin  80 mg Oral QPM  ? clopidogrel  75 mg Oral Daily  ? enoxaparin (LOVENOX) injection  40 mg Subcutaneous Q2

## 2022-01-09 NOTE — Progress Notes (Signed)
? ?HD#0 ?SUBJECTIVE:  ?Patient Summary: Joseph Patrick is a 76 y.o. 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  ? ?Overnight Events:  ?No acute events overnight ? ?Interim History:  ?Assessed at bedside this AM.  He states that he continues to feel well.  We talked about recommendations from cardiology and he is willing to take medications once he is discharged.  No other concerns at this time. ? ?OBJECTIVE:  ?Vital Signs: ?Vitals:  ? 01/09/22 0500 01/09/22 0840 01/09/22 1006 01/09/22 1235  ?BP:  (!) 166/85 (!) 163/93 (!) 165/101  ?Pulse:  69 70 74  ?Resp:  16  20  ?Temp:  98.3 ?F (36.8 ?C)  98.2 ?F (36.8 ?C)  ?TempSrc:  Oral  Oral  ?SpO2:  98%  98%  ?Weight: 79 kg     ?Height:      ? ?Supplemental O2: Room Air ?SpO2: 98 % ?O2 Flow Rate (L/min): 2 L/min ? ?Filed Weights  ? 01/06/22 2346 01/09/22 0500  ?Weight: 81.6 kg 79 kg  ? ? ? ?Intake/Output Summary (Last 24 hours) at 01/09/2022 1327 ?Last data filed at 01/08/2022 1523 ?Gross per 24 hour  ?Intake --  ?Output 150 ml  ?Net -150 ml  ? ? ?Net IO Since Admission: -421.89 mL [01/09/22 1327] ? ?Physical Exam: ?Constitutional: No acute distress ?HENT: Normocephalic ?Eyes: EOMI, sclera non-icteric ?Cardiovascular: 2/6 systolic murmur best heard in apex, no JVD appreciated ?Pulmonary/Chest: Normal respirations on room air ?MSK: Normal tone, skin is warm and dry ?Neurological: Alert and oriented x3 ?Psych: Normal mood ? ?Patient Lines/Drains/Airways Status   ? ? Active Line/Drains/Airways   ? ? Name Placement date Placement time Site Days  ? Peripheral IV 01/06/22 18 G Left Antecubital 01/06/22  0754  Antecubital  1  ? ?  ?  ? ?  ? ? ?Pertinent Labs: ? ?  Latest Ref Rng & Units 01/06/2022  ? 12:40 PM 01/06/2022  ?  8:13 AM 02/19/2020  ? 10:20 AM  ?CBC  ?WBC 4.0 - 10.5 K/uL  6.4   5.5    ?Hemoglobin 13.0 - 17.0 g/dL 15.3   15.5   15.7    ?Hematocrit 39.0 - 52.0 %  45.0   47.1   47.5    ?Platelets 150 - 400 K/uL  177   246    ? ? ? ?  Latest Ref Rng & Units 01/09/2022  ?  3:13 AM 01/08/2022  ? 12:32 AM 01/07/2022  ?  5:51 AM  ?CMP  ?Glucose 70 - 99 mg/dL 134   130   130    ?BUN 8 - 23 mg/dL 18   13   14     ?Creatinine 0.61 - 1.24 mg/dL 1.48   1.67   1.41    ?Sodium 135 - 145 mmol/L 138   140   137    ?Potassium 3.5 - 5.1 mmol/L 4.0   3.8   4.0    ?Chloride 98 - 111 mmol/L 104   106   104    ?CO2 22 - 32 mmol/L 27   28   26     ?Calcium 8.9 - 10.3 mg/dL 9.0   9.0   8.7    ? ? ?Recent Labs  ?  01/08/22 ?2137 01/09/22 ?[id number] 01/09/22 ?1225  ?GLUCAP 138* 119* 129*  ? ?  ? ?Pertinent Imaging: ?No results found. ? ?ASSESSMENT/PLAN:  ?Assessment: ?  Principal Problem: ?  Cerebral infarction [facility]) ?Active Problems: ?  Hypertension ?  Hyperlipidemia ?  Diabetes ([place]) ?  Stage 3a chronic kidney disease (CKD) ([place]) ?  Heart failure with reduced ejection fraction ([place]) ? ? ?Joseph Patrick is a 76 y.o. living with a history of uncontrolled hypertension, diabetes and CKD 3 a who presented with transient right lower extremity numbness and admitted for acute CVA.  On work-up for his CVA he was found to have HFrEF with an EF of 25 to 30%, cardiology onboard. Currently titrating GDMT with plans to discharge tomorrow. ? ?Plan: ? ?New diagnosis of HFrEF ?Valvulopathy-mitral regurgitation ?Echo showed EF 25 to 30% with severely decreased function of the left ventricle and global hypokinesis.  This also showed moderate mitral valve regurgitation.  He was at evaluated by cardiology and we are titrating goal-directed medical therapy.  His blood pressure is improved today.  His creatinine is improved from yesterday and potassium at 4. It is likely that his heart failure is secondary to nonischemic from poorly controlled hypertension.  He will need a follow-up echo in 3 months with cardiology and they plan to risk stratify for CAD at that time.  ?-Cardiology following, recommend holding off on MRA for now with  titration of other medications. ?-Initiating goal-directed medical therapy with metoprolol 50 mg twice daily, Entresto 24-26 mg BID, isosorbide-hydralazine 20-37.5 mg TID ? ?Acute CVA ?MRI with subtle left frontal parietal white matter lesion. His symptoms were not consistent with distribution of stroke.  Clarified with neurology attending about DAPT therapy patient is to continue aspirin and Plavix for 3 weeks and then to continue aspirin indefinitely.  He will need close follow-up for diabetes as an outpatient.  Neurological deficits have resolved. ?-Aspirin 81 mg indefinitely ?-Plavix 75 mg for 3 weeks, until May 15 ?-Continue atorvastatin 80 mg daily ? ?CKD 3a ?Baseline creatinine of 1.35-1.4.  Creatinine today of 1.5, GFR 45.  We will continue with treatment of his hypertension and diabetes in hopes of decreasing the progression of CKD.   ?-Avoid nephrotoxic agents ?-Daily BMP ?-Entresto 24-26 mg BID ?-Start empagliflozin 10 mg ? ?Uncontrolled hypertension ?Blood pressures improved today.  GDMT has been initiated with Entresto, isosorbide-hydralazine, and metoprolol tartrate. ?-Entresto 24-26 mg BID ?-Isosorbide-hydralazine 20-37.5 mg TID ?-Metoprolol tartrate 50 mg BID ? ?Type 2 diabetes ?Continue SSI during hospitalization. HgbA1c at 7.1. ?-start empagliflozin 10 mg ? ?Parotid nodule ?HIV test was nonreactive.   ? ? ?Best Practice: ?Diet: Regular diet ?IVF: None ?VTE: enoxaparin (LOVENOX) injection 40 mg Start: 01/08/22 1800 ?Code: Full ? ?Therapy Recs: None  ? ?DISPO: Anticipated discharge home tomorrow with reduction of blood pressure. ? ?Signature: ?Joseph Patrick, D.O.  ?Internal Medicine Resident, PGY-1 ?[facility]  ?Pager: [phone] ?1:27 PM, 01/09/2022  ? ?**Please contact the on call pager after 5 pm and on weekends at [phone].** ? ?  ?

## 2022-01-10 ENCOUNTER — Other Ambulatory Visit (HOSPITAL_COMMUNITY): Payer: Self-pay

## 2022-01-10 DIAGNOSIS — I6300 Cerebral infarction due to thrombosis of unspecified precerebral artery: Secondary | ICD-10-CM

## 2022-01-10 LAB — BASIC METABOLIC PANEL
Anion gap: 6 (ref 5–15)
BUN: 18 mg/dL (ref 8–23)
CO2: 28 mmol/L (ref 22–32)
Calcium: 9.2 mg/dL (ref 8.9–10.3)
Chloride: 105 mmol/L (ref 98–111)
Creatinine, Ser: 1.51 mg/dL — ABNORMAL HIGH (ref 0.61–1.24)
GFR, Estimated: 48 mL/min — ABNORMAL LOW (ref >60)
Glucose, Bld: 123 mg/dL — ABNORMAL HIGH (ref 70–99)
Potassium: 3.8 mmol/L (ref 3.5–5.1)
Sodium: 139 mmol/L (ref 135–145)

## 2022-01-10 LAB — GLUCOSE, CAPILLARY
Glucose-Capillary: 141 mg/dL — ABNORMAL HIGH (ref 70–99)
Glucose-Capillary: 145 mg/dL — ABNORMAL HIGH (ref 70–99)

## 2022-01-10 LAB — MAGNESIUM: Magnesium: 2.1 mg/dL (ref 1.7–2.4)

## 2022-01-10 MED ORDER — POTASSIUM CHLORIDE CRYS ER 20 MEQ PO TBCR
20.0000 meq | EXTENDED_RELEASE_TABLET | Freq: Once | ORAL | Status: AC
  Administered 2022-01-10: 20 meq via ORAL
  Filled 2022-01-10: qty 1

## 2022-01-10 MED ORDER — ISOSORB DINITRATE-HYDRALAZINE 20-37.5 MG PO TABS
1.0000 | ORAL_TABLET | Freq: Three times a day (TID) | ORAL | 0 refills | Status: DC
  Filled 2022-01-10 (×2): qty 30, 10d supply, fill #0

## 2022-01-10 MED ORDER — EMPAGLIFLOZIN 10 MG PO TABS
10.0000 mg | ORAL_TABLET | Freq: Every day | ORAL | 0 refills | Status: DC
  Filled 2022-01-10: qty 30, 30d supply, fill #0

## 2022-01-10 MED ORDER — SACUBITRIL-VALSARTAN 24-26 MG PO TABS
1.0000 | ORAL_TABLET | Freq: Two times a day (BID) | ORAL | 0 refills | Status: DC
  Filled 2022-01-10: qty 60, 30d supply, fill #0

## 2022-01-10 MED ORDER — ASPIRIN 81 MG PO TBEC
81.0000 mg | DELAYED_RELEASE_TABLET | Freq: Every day | ORAL | 11 refills | Status: DC
  Filled 2022-01-10 (×2): qty 30, 30d supply, fill #0

## 2022-01-10 MED ORDER — METOPROLOL TARTRATE 50 MG PO TABS
50.0000 mg | ORAL_TABLET | Freq: Two times a day (BID) | ORAL | 0 refills | Status: DC
  Filled 2022-01-10: qty 60, 30d supply, fill #0

## 2022-01-10 MED ORDER — DAPAGLIFLOZIN PROPANEDIOL 5 MG PO TABS
5.0000 mg | ORAL_TABLET | Freq: Every day | ORAL | 0 refills | Status: DC
  Filled 2022-01-10: qty 30, 30d supply, fill #0

## 2022-01-10 MED ORDER — CLOPIDOGREL BISULFATE 75 MG PO TABS
75.0000 mg | ORAL_TABLET | Freq: Every day | ORAL | 0 refills | Status: DC
  Filled 2022-01-10: qty 18, 18d supply, fill #0

## 2022-01-10 MED ORDER — ATORVASTATIN CALCIUM 80 MG PO TABS
80.0000 mg | ORAL_TABLET | Freq: Every day | ORAL | 0 refills | Status: DC
  Filled 2022-01-10: qty 30, 30d supply, fill #0

## 2022-01-10 NOTE — Plan of Care (Signed)

## 2022-01-10 NOTE — Progress Notes (Signed)
? ?Cardiologist:  Odesser Tourangeau ? ?Subjective:  ?Denies SSCP, palpitations or Dyspnea ?No residual neuro deficits BP labile but much improved ?Ready for D/c  ? ?Objective:  ?Vitals:  ? 01/09/22 2011 01/10/22 0015 01/10/22 0351 01/10/22 0750  ?BP: (!) 154/94   (!) 177/104  ?Pulse: 67   75  ?Resp: 19   17  ?Temp: 98.3 ?F (36.8 ?C) 98.4 ?F (36.9 ?C) 97.6 ?F (36.4 ?C) (!) 97.5 ?F (36.4 ?C)  ?TempSrc: Oral Oral Oral Axillary  ?SpO2: 97% 95% 94% 97%  ?Weight:      ?Height:      ? ? ?Intake/Output from previous day: ?No intake or output data in the 24 hours ending 01/10/22 0908 ? ? ?Physical Exam: ?Affect appropriate ?Healthy:  appears stated age ?HEENT: normal ?Neck supple with no adenopathy ?JVP normal no bruits no thyromegaly ?Lungs clear with no wheezing and good diaphragmatic motion ?Heart:  S1/S2 apical MR murmur, no rub, gallop or click ?PMI enlarged  ?Abdomen: benighn, BS positve, no tenderness, no AAA ?no bruit.  No HSM or HJR ?Distal pulses intact with no bruits ?No edema ?Neuro non-focal ?Skin warm and dry ?No muscular weakness ? ? ?Lab Results: ?Basic Metabolic Panel: ?Recent Labs  ?  01/08/22 ?8119 01/09/22 ?1478 01/10/22 ?0210  ?NA 140 138 139  ?K 3.8 4.0 3.8  ?CL 106 104 105  ?CO2 28 27 28   ?GLUCOSE 130* 134* 123*  ?BUN 13 18 18   ?CREATININE 1.67* 1.48* 1.51*  ?CALCIUM 9.0 9.0 9.2  ?MG 2.1  --  2.1  ? ?Liver Function Tests: ?No results for input(s): AST, ALT, ALKPHOS, BILITOT, PROT, ALBUMIN in the last 72 hours. ? ?No results for input(s): LIPASE, AMYLASE in the last 72 hours. ?CBC: ?No results for input(s): WBC, NEUTROABS, HGB, HCT, MCV, PLT in the last 72 hours. ? ?Cardiac Enzymes: ?No results for input(s): CKTOTAL, CKMB, CKMBINDEX, TROPONINI in the last 72 hours. ?BNP: ?Invalid input(s): POCBNP ?D-Dimer: ?No results for input(s): DDIMER in the last 72 hours. ?Hemoglobin A1C: ?Recent Labs  ?  01/09/22 ?0313  ?HGBA1C 7.1*  ? ?Fasting Lipid Panel: ?No results for input(s): CHOL, HDL, LDLCALC, TRIG, CHOLHDL,  LDLDIRECT in the last 72 hours. ? ?Thyroid Function Tests: ?No results for input(s): TSH, T4TOTAL, T3FREE, THYROIDAB in the last 72 hours. ? ?Invalid input(s): FREET3 ?Anemia Panel: ?No results for input(s): VITAMINB12, FOLATE, FERRITIN, TIBC, IRON, RETICCTPCT in the last 72 hours. ? ?Imaging: ?No results found. ? ?Cardiac Studies: ? ECG: NSR LVH ? ? Telemetry: ? Echo: EF 25-30% moderate MR  ? ?Medications: ?  ? aspirin EC  81 mg Oral Daily  ? atorvastatin  80 mg Oral QPM  ? clopidogrel  75 mg Oral Daily  ? empagliflozin  10 mg Oral Daily  ? enoxaparin (LOVENOX) injection  40 mg Subcutaneous Q24H  ? insulin aspart  0-6 Units Subcutaneous TID WC  ? isosorbide-hydrALAZINE  1 tablet Oral TID  ? metoprolol tartrate  50 mg Oral BID  ? sacubitril-valsartan  1 tablet Oral BID  ? ?  ? ?Assessment/Plan:  ?DCM:  on better GDMT now and BP improved Likely non ischemic secondary to poorly controlled BP.  Will need f/u echo or MRI in 3 months and outpatient risk stratification for CAD Not volume overloaded and no need for diuretic ?MR:  moderate functional murmur noted Hopefully will improve on medical Rx ?CVA:  DAT no residual deficits ?DM:  A1c 7.1 better than 9 a year ago Primary service should consider adding oral  agents ? Jardiance ?HLD:  in setting of stroke on statin  ? ?Have arranged outpatient f/u with me Cr stable 1.5 K 3.8 likely add aldactone as outpatient after he is use to his current new meds ? ?Charlton Haws ?01/10/2022, 9:08 AM ? ? ? ?

## 2022-01-10 NOTE — Progress Notes (Signed)
?   01/10/22 1300  ?Clinical Encounter Type  ?Visited With Patient and family together  ?Visit Type Follow-up;Other (Comment) ?(Advanced Directive)  ?Referral From Chaplain  ?Consult/Referral To Chaplain  ? ?Patient completed Advanced Directive paperwork. Document was notarized by Winfred Burn. Patient was given copies, copy sent to [facility] Documents and hard copy is in patient file.  ? ?Valerie Roys ?Chaplain  ?[facility]  ?[phone]  ?

## 2022-01-10 NOTE — TOC Benefit Eligibility Note (Signed)
Patient Advocate Encounter ? ?Insurance verification completed.   ? ?The patient is currently admitted and upon discharge could be taking Jardiance 10 mg. ? ?The current 30 day co-pay is, $45.00.  ? ?The patient is currently admitted and upon discharge could be taking Entresto 24-26 mg. ? ?The current 30 day co-pay is, $45.00.  ? ?The patient is insured through [employer] Part D  ? ? ?Roland Earl, CPhT ?Pharmacy Patient Advocate Specialist ?[facility] Patient Advocate Team ?Direct Number: [phone]  Fax: [phone] ? ? ? ? ? ?  ?

## 2022-01-10 NOTE — TOC Transition Note (Signed)
Transition of Care (TOC) - CM/SW Discharge Note ? ? ?Patient Details  ?Name: Joseph Patrick ?MRN: [id number] ?Date of Birth: 06-Nov-1945 ? ?Transition of Care (TOC) CM/SW Contact:  ?Pollie Friar, RN ?Phone Number: ?01/10/2022, 11:58 AM ? ? ?Clinical Narrative:    ?Patient discharging home with self care. No f/u per PT.  ?No needs per TOC.  ? ? ?Final next level of care: Home/Self Care ?Barriers to Discharge: No Barriers Identified ? ? ?Patient Goals and CMS Choice ?  ?  ?  ? ?Discharge Placement ?  ?           ?  ?  ?  ?  ? ?Discharge Plan and Services ?  ?Discharge Planning Services: CM Consult ?           ?  ?  ?  ?  ?  ?  ?  ?  ?  ?  ? ?Social Determinants of Health (SDOH) Interventions ?  ? ? ?Readmission Risk Interventions ?   ? View : No data to display.  ?  ?  ?  ? ? ? ? ? ?

## 2022-01-11 ENCOUNTER — Telehealth: Payer: Self-pay | Admitting: Cardiovascular Disease

## 2022-01-11 NOTE — Telephone Encounter (Signed)
TOC scheduled for 01/19/22 at 10:05am with Eligha Bridegroom, NP per Dr. Eden Emms ?

## 2022-01-12 NOTE — Telephone Encounter (Signed)
**Note De-Identified Mende [place] Obfuscation** Patient contacted regarding discharge from [facility] on 01/10/2022. ? ?Patient understands to follow up with provider Eligha Bridegroom, NP on 01/19/2022 at 10:05 at [address] in [place], [place] [address]. ? ?Patient understands discharge instructions? Yes ?Patient understands medications and regiment? Yes ?Patient understands to bring all medications to this visit? Yes  ? ?Ask patient:  Are you enrolled in My Chart: No, and states he is not interested. ? ?The pt states that is is doing well and is without c/o CP/discomfort, headaches, nausea, diaphoresis, dizziness, ot lightheadedness. ? ?He verified that he does have [facility] phone number to call if he has any questions or concerns. ?He thanked me for my call. ? ?

## 2022-01-14 ENCOUNTER — Inpatient Hospital Stay (HOSPITAL_COMMUNITY)
Admission: EM | Admit: 2022-01-14 | Discharge: 2022-01-22 | DRG: 065 | Disposition: A | Discharge status: Rehabilitation Facility | Payer: Medicare HMO | Attending: Internal Medicine | Admitting: Internal Medicine

## 2022-01-14 ENCOUNTER — Emergency Department (HOSPITAL_COMMUNITY): Payer: Medicare HMO

## 2022-01-14 ENCOUNTER — Encounter (HOSPITAL_COMMUNITY): Payer: Self-pay

## 2022-01-14 ENCOUNTER — Other Ambulatory Visit: Payer: Self-pay

## 2022-01-14 DIAGNOSIS — Z7902 Long term (current) use of antithrombotics/antiplatelets: Secondary | ICD-10-CM

## 2022-01-14 DIAGNOSIS — I130 Hypertensive heart and chronic kidney disease with heart failure and stage 1 through stage 4 chronic kidney disease, or unspecified chronic kidney disease: Secondary | ICD-10-CM | POA: Diagnosis present

## 2022-01-14 DIAGNOSIS — Z7982 Long term (current) use of aspirin: Secondary | ICD-10-CM

## 2022-01-14 DIAGNOSIS — Z888 Allergy status to other drugs, medicaments and biological substances status: Secondary | ICD-10-CM

## 2022-01-14 DIAGNOSIS — Z91148 Patient's other noncompliance with medication regimen for other reason: Secondary | ICD-10-CM

## 2022-01-14 DIAGNOSIS — I6381 Other cerebral infarction due to occlusion or stenosis of small artery: Secondary | ICD-10-CM | POA: Diagnosis not present

## 2022-01-14 DIAGNOSIS — I63512 Cerebral infarction due to unspecified occlusion or stenosis of left middle cerebral artery: Secondary | ICD-10-CM | POA: Diagnosis not present

## 2022-01-14 DIAGNOSIS — E1122 Type 2 diabetes mellitus with diabetic chronic kidney disease: Secondary | ICD-10-CM | POA: Diagnosis present

## 2022-01-14 DIAGNOSIS — I69359 Hemiplegia and hemiparesis following cerebral infarction affecting unspecified side: Secondary | ICD-10-CM | POA: Diagnosis present

## 2022-01-14 DIAGNOSIS — Z6826 Body mass index (BMI) 26.0-26.9, adult: Secondary | ICD-10-CM

## 2022-01-14 DIAGNOSIS — I639 Cerebral infarction, unspecified: Principal | ICD-10-CM

## 2022-01-14 DIAGNOSIS — E1165 Type 2 diabetes mellitus with hyperglycemia: Secondary | ICD-10-CM | POA: Diagnosis present

## 2022-01-14 DIAGNOSIS — Z79899 Other long term (current) drug therapy: Secondary | ICD-10-CM

## 2022-01-14 DIAGNOSIS — Z8673 Personal history of transient ischemic attack (TIA), and cerebral infarction without residual deficits: Secondary | ICD-10-CM

## 2022-01-14 DIAGNOSIS — E663 Overweight: Secondary | ICD-10-CM | POA: Diagnosis present

## 2022-01-14 DIAGNOSIS — Z8249 Family history of ischemic heart disease and other diseases of the circulatory system: Secondary | ICD-10-CM

## 2022-01-14 DIAGNOSIS — M21371 Foot drop, right foot: Secondary | ICD-10-CM | POA: Diagnosis present

## 2022-01-14 DIAGNOSIS — E785 Hyperlipidemia, unspecified: Secondary | ICD-10-CM | POA: Diagnosis present

## 2022-01-14 DIAGNOSIS — I5042 Chronic combined systolic (congestive) and diastolic (congestive) heart failure: Secondary | ICD-10-CM | POA: Diagnosis present

## 2022-01-14 DIAGNOSIS — N1831 Chronic kidney disease, stage 3a: Secondary | ICD-10-CM | POA: Diagnosis present

## 2022-01-14 DIAGNOSIS — E872 Acidosis, unspecified: Secondary | ICD-10-CM | POA: Diagnosis present

## 2022-01-14 DIAGNOSIS — G8191 Hemiplegia, unspecified affecting right dominant side: Secondary | ICD-10-CM | POA: Diagnosis present

## 2022-01-14 DIAGNOSIS — R270 Ataxia, unspecified: Secondary | ICD-10-CM | POA: Diagnosis present

## 2022-01-14 DIAGNOSIS — R29704 NIHSS score 4: Secondary | ICD-10-CM | POA: Diagnosis present

## 2022-01-14 DIAGNOSIS — Z20822 Contact with and (suspected) exposure to covid-19: Secondary | ICD-10-CM | POA: Diagnosis present

## 2022-01-14 DIAGNOSIS — R001 Bradycardia, unspecified: Secondary | ICD-10-CM | POA: Diagnosis present

## 2022-01-14 HISTORY — DX: Cerebral infarction, unspecified: I63.9

## 2022-01-14 HISTORY — DX: Type 2 diabetes mellitus without complications: E11.9

## 2022-01-14 LAB — COMPREHENSIVE METABOLIC PANEL
ALT: 36 U/L (ref 0–44)
AST: 27 U/L (ref 15–41)
Albumin: 3.6 g/dL (ref 3.5–5.0)
Alkaline Phosphatase: 70 U/L (ref 38–126)
Anion gap: 9 (ref 5–15)
BUN: 14 mg/dL (ref 8–23)
CO2: 21 mmol/L — ABNORMAL LOW (ref 22–32)
Calcium: 8.9 mg/dL (ref 8.9–10.3)
Chloride: 105 mmol/L (ref 98–111)
Creatinine, Ser: 1.43 mg/dL — ABNORMAL HIGH (ref 0.61–1.24)
GFR, Estimated: 51 mL/min — ABNORMAL LOW (ref >60)
Glucose, Bld: 136 mg/dL — ABNORMAL HIGH (ref 70–99)
Potassium: 3.8 mmol/L (ref 3.5–5.1)
Sodium: 135 mmol/L (ref 135–145)
Total Bilirubin: 1.1 mg/dL (ref 0.3–1.2)
Total Protein: 7.4 g/dL (ref 6.5–8.1)

## 2022-01-14 LAB — URINALYSIS, ROUTINE W REFLEX MICROSCOPIC
Bacteria, UA: NONE SEEN
Bilirubin Urine: NEGATIVE
Glucose, UA: >=500 mg/dL — AB
Ketones, ur: 5 mg/dL — AB
Leukocytes,Ua: NEGATIVE
Nitrite: NEGATIVE
Protein, ur: NEGATIVE mg/dL
Specific Gravity, Urine: 1.016 (ref 1.005–1.030)
pH: 5 (ref 5.0–8.0)

## 2022-01-14 LAB — DIFFERENTIAL
Abs Immature Granulocytes: 0.02 10*3/uL (ref 0.00–0.07)
Basophils Absolute: 0 10*3/uL (ref 0.0–0.1)
Basophils Relative: 0 %
Eosinophils Absolute: 0.1 10*3/uL (ref 0.0–0.5)
Eosinophils Relative: 1 %
Immature Granulocytes: 0 %
Lymphocytes Relative: 23 %
Lymphs Abs: 1.8 10*3/uL (ref 0.7–4.0)
Monocytes Absolute: 0.4 10*3/uL (ref 0.1–1.0)
Monocytes Relative: 6 %
Neutro Abs: 5.2 10*3/uL (ref 1.7–7.7)
Neutrophils Relative %: 70 %

## 2022-01-14 LAB — CBC
HCT: 45.5 % (ref 39.0–52.0)
Hemoglobin: 15.1 g/dL (ref 13.0–17.0)
MCH: 29.9 pg (ref 26.0–34.0)
MCHC: 33.2 g/dL (ref 30.0–36.0)
MCV: 90.1 fL (ref 80.0–100.0)
Platelets: 234 10*3/uL (ref 150–400)
RBC: 5.05 MIL/uL (ref 4.22–5.81)
RDW: 12.6 % (ref 11.5–15.5)
WBC: 7.5 10*3/uL (ref 4.0–10.5)
nRBC: 0 % (ref 0.0–0.2)

## 2022-01-14 LAB — I-STAT CHEM 8, ED
BUN: 19 mg/dL (ref 8–23)
Calcium, Ion: 1 mmol/L — ABNORMAL LOW (ref 1.15–1.40)
Chloride: 106 mmol/L (ref 98–111)
Creatinine, Ser: 1.4 mg/dL — ABNORMAL HIGH (ref 0.61–1.24)
Glucose, Bld: 137 mg/dL — ABNORMAL HIGH (ref 70–99)
HCT: 46 % (ref 39.0–52.0)
Hemoglobin: 15.6 g/dL (ref 13.0–17.0)
Potassium: 4 mmol/L (ref 3.5–5.1)
Sodium: 138 mmol/L (ref 135–145)
TCO2: 22 mmol/L (ref 22–32)

## 2022-01-14 LAB — RAPID URINE DRUG SCREEN, HOSP PERFORMED
Amphetamines: NOT DETECTED
Barbiturates: NOT DETECTED
Benzodiazepines: NOT DETECTED
Cocaine: NOT DETECTED
Opiates: NOT DETECTED
Tetrahydrocannabinol: NOT DETECTED

## 2022-01-14 LAB — PROTIME-INR
INR: 1.1 (ref 0.8–1.2)
Prothrombin Time: 13.9 seconds (ref 11.4–15.2)

## 2022-01-14 LAB — RESP PANEL BY RT-PCR (FLU A&B, COVID) ARPGX2
Influenza A by PCR: NEGATIVE
Influenza B by PCR: NEGATIVE
SARS Coronavirus 2 by RT PCR: NEGATIVE

## 2022-01-14 LAB — APTT: aPTT: 30 s (ref 24–36)

## 2022-01-14 LAB — ETHANOL: Alcohol, Ethyl (B): <10 mg/dL

## 2022-01-14 LAB — GLUCOSE, CAPILLARY
Glucose-Capillary: 100 mg/dL — ABNORMAL HIGH (ref 70–99)
Glucose-Capillary: 183 mg/dL — ABNORMAL HIGH (ref 70–99)

## 2022-01-14 IMAGING — MR MR HEAD W/O CM
6 of 10 series · 29 of 48 positions shown · non-contrast
Comparison: [DATE]

CLINICAL DATA: Neuro deficit, acute, stroke suspected; right leg
weakness

EXAM:
MRI HEAD WITHOUT CONTRAST
TECHNIQUE: Multiplanar, multiecho pulse sequences of the brain and surrounding
structures were obtained without intravenous contrast.

[Series 2: DWI · axial · 3.0mm · 0.94mm/px · z∈[-78,+65]mm · 9 of 96 slices shown (1 of 2)]
[im 1/96]
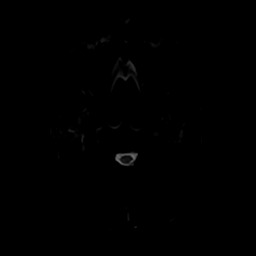
[im 12/96]
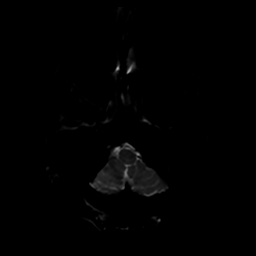
[im 24/96]
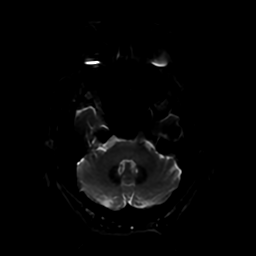
[im 36/96]
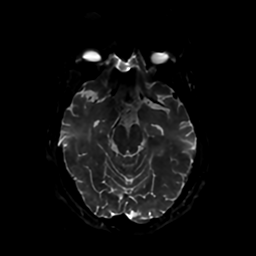
[im 48/96]
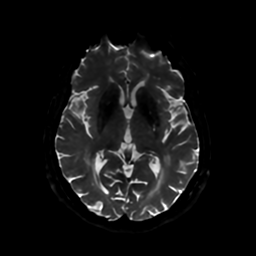
[im 60/96]
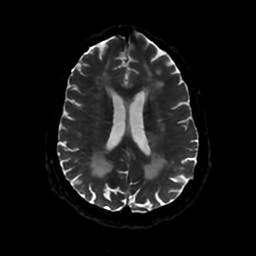
[im 72/96]
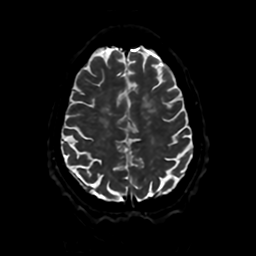
[im 84/96]
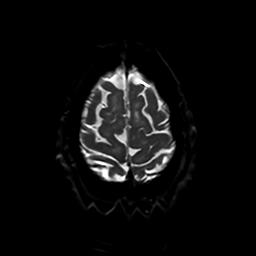
[im 96/96]
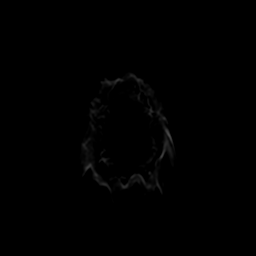

[Series 3: DWI · coronal · 4.0mm · 0.94mm/px · 7 of 73 slices shown (2 of 2)]
[im 1/73]
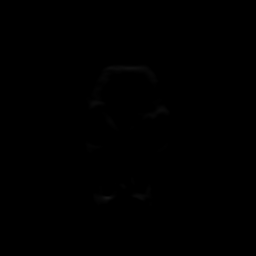
[im 13/73]
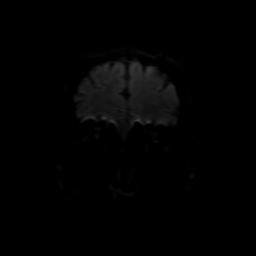
[im 25/73]
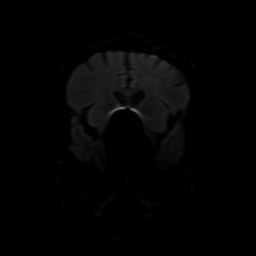
[im 37/73]
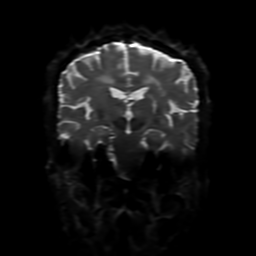
[im 49/73]
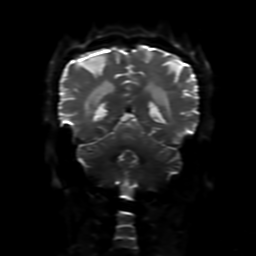
[im 61/73]
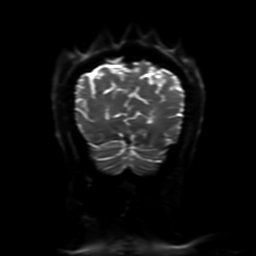
[im 73/73]
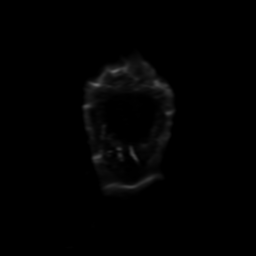

[Series 4: FLAIR · sagittal · 5.0mm · 0.23mm/px · 2 of 24 slices shown (1 of 2)]
[im 1/24]
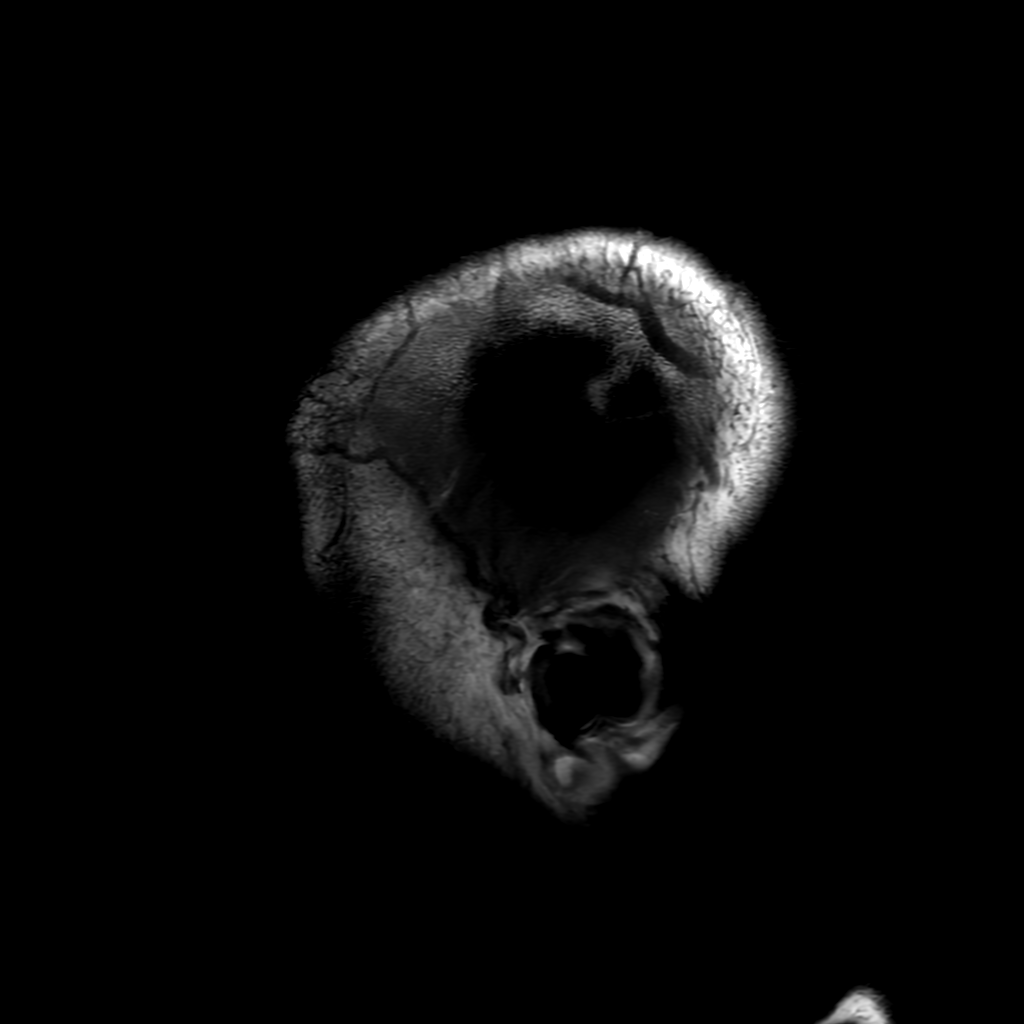
[im 24/24]
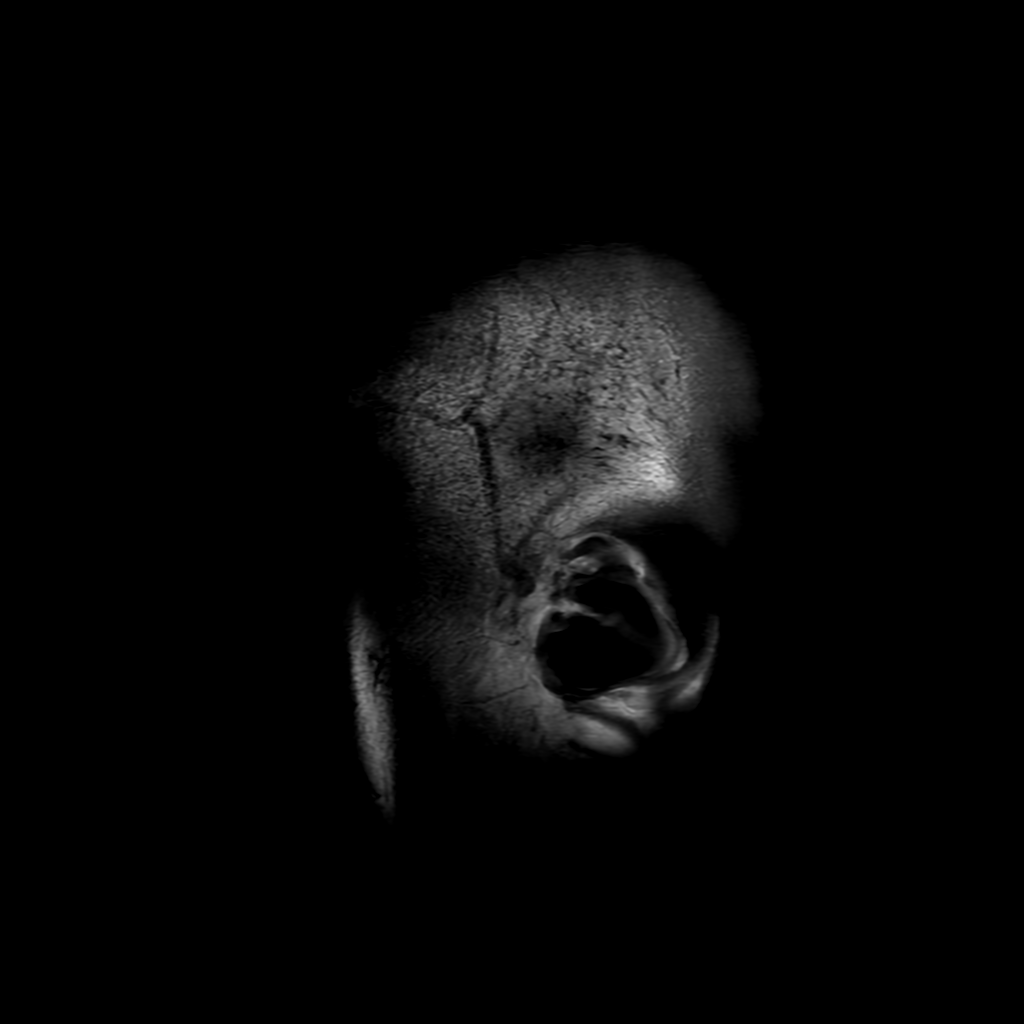

[Series 6: FLAIR · axial · 4.0mm · 0.45mm/px · z∈[-79,+65]mm · 3 of 35 slices shown (2 of 2)]
[im 1/35]
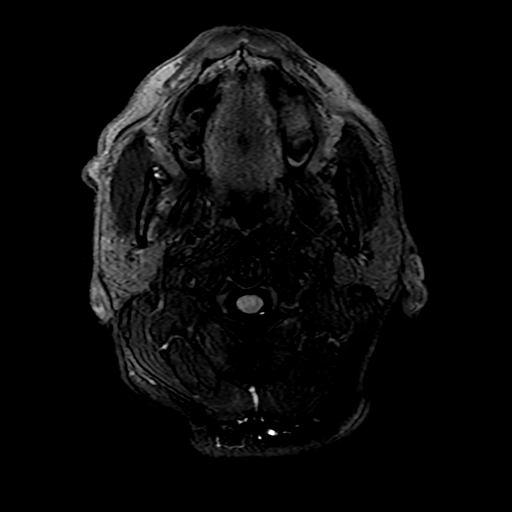
[im 18/35]
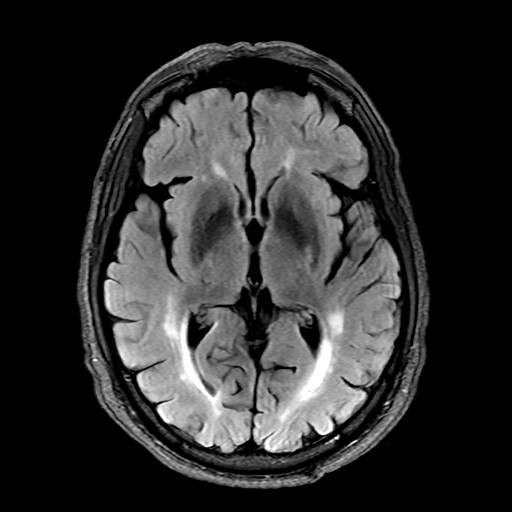
[im 35/35]
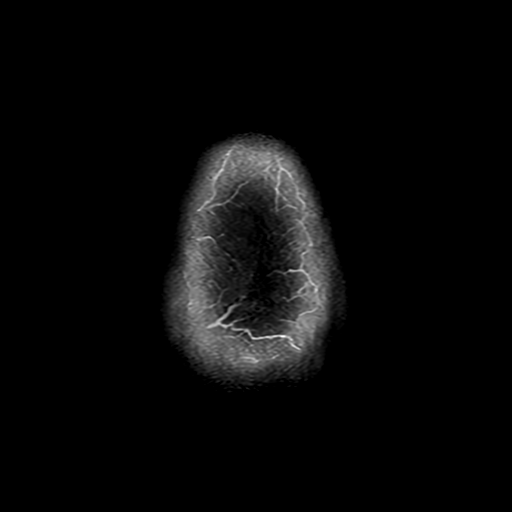

[Series 250: ADC · axial · 3.0mm · 0.94mm/px · z∈[-78,+65]mm · 5 of 50 slices shown (1 of 2)]
[im 1/50]
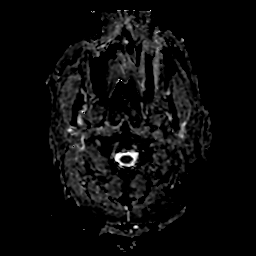
[im 13/50]
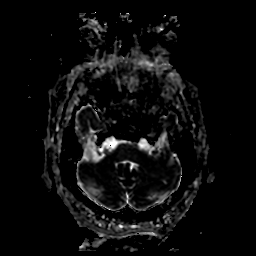
[im 25/50]
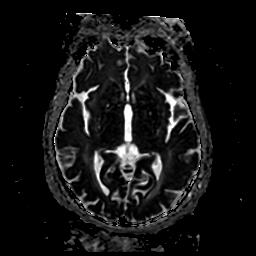
[im 37/50]
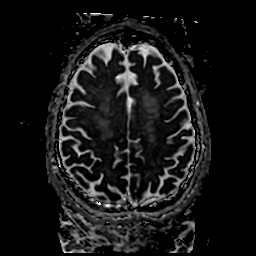
[im 50/50]
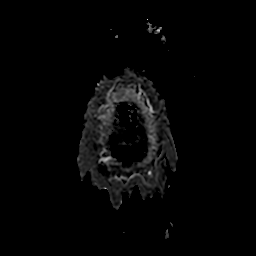

[Series 350: ADC · coronal · 4.0mm · 0.94mm/px · 3 of 35 slices shown (2 of 2)]
[im 1/35]
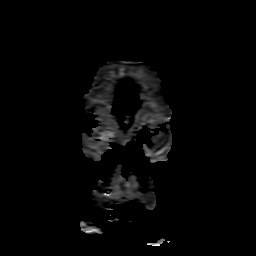
[im 18/35]
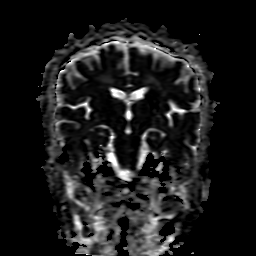
[im 35/35]
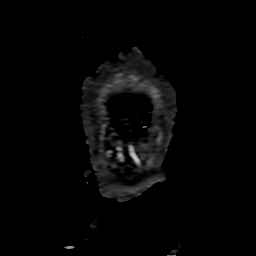

[29 of 48 positions shown; findings below may reference images not displayed]

FINDINGS: Brain: Restricted diffusion involving the left corona radiata,
caudate body, and posterior lentiform nucleus. This corresponds to
abnormality seen on CT. Foci of susceptibility are again identified
in bilateral parietal subcortical white matter reflecting chronic
blood products. Ventricles and sulci are stable in size and
configuration. Patchy and confluent T2 hyperintensity in the
supratentorial white matter probably reflects stable chronic
microvascular ischemic changes.

There is no intracranial mass, mass effect, hydrocephalus, or
extra-axial collection.

Vascular: Major vessel flow voids at the skull base are preserved.

Skull and upper cervical spine: Normal marrow signal is preserved.

Sinuses/Orbits: Paranasal sinuses are aerated. Orbits are
unremarkable.

Other: Sella is unremarkable.  Mastoid air cells are clear.
IMPRESSION: Acute infarction involving the left corona radiata, caudate body,
and posterior lentiform nucleus.

Otherwise no change from recent prior study.

## 2022-01-14 IMAGING — CT CT HEAD W/O CM
3 series · 15 of 47 positions shown, 18 images · non-contrast
Comparison: [DATE]

CLINICAL DATA: Worsening neurological deficit, worsening of
right-sided weakness



[Series 2: head 5.0 h30s · axial · 0.46mm/px · z∈[+1152,+1292]mm · 9 of 34 slices shown, 12 images]
[im 3/34  brain]
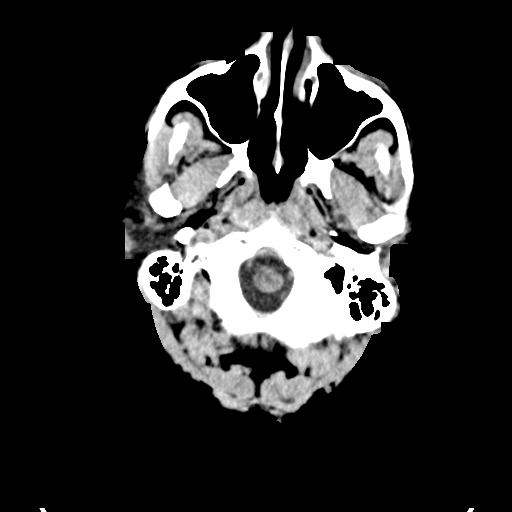
[im 3/34  bone]
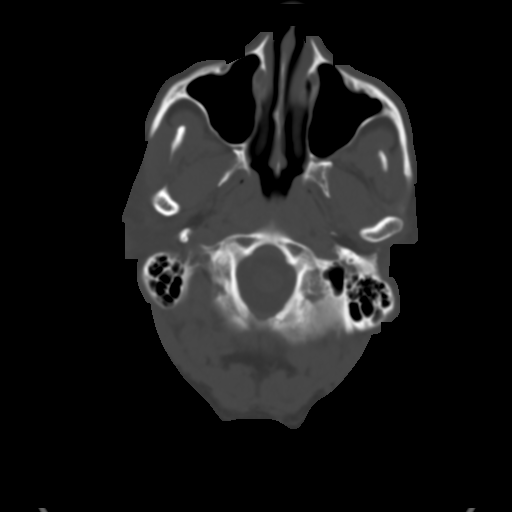
[im 6/34  brain]
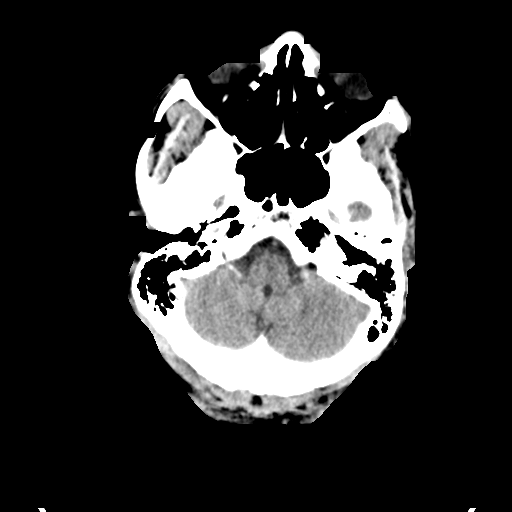
[im 10/34  brain]
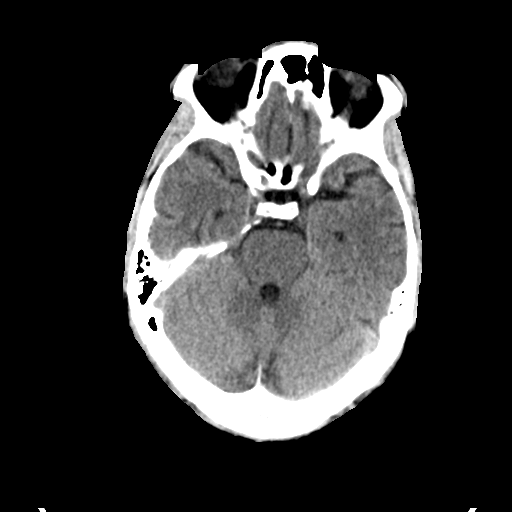
[im 13/34  brain]
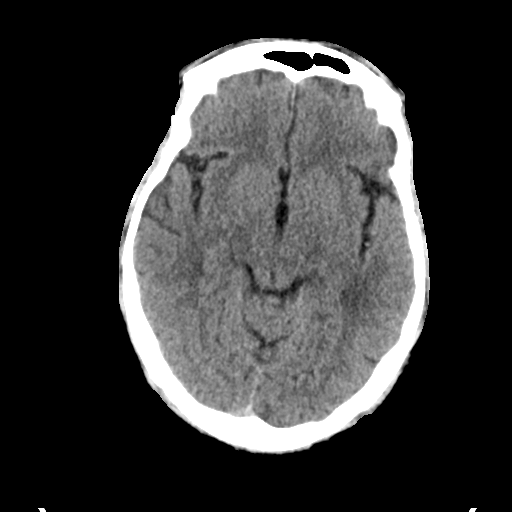
[im 18/34  brain]
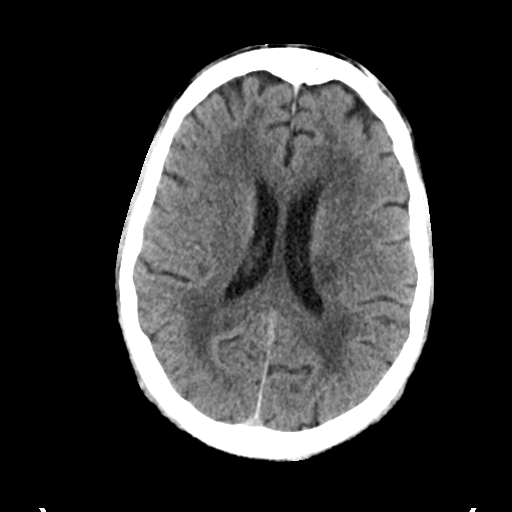
[im 18/34  bone]
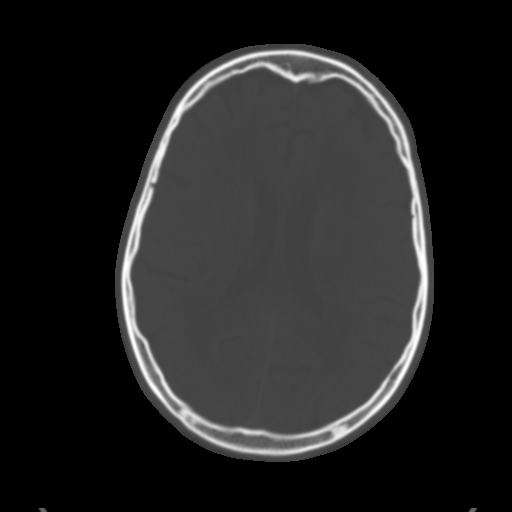
[im 21/34  brain]
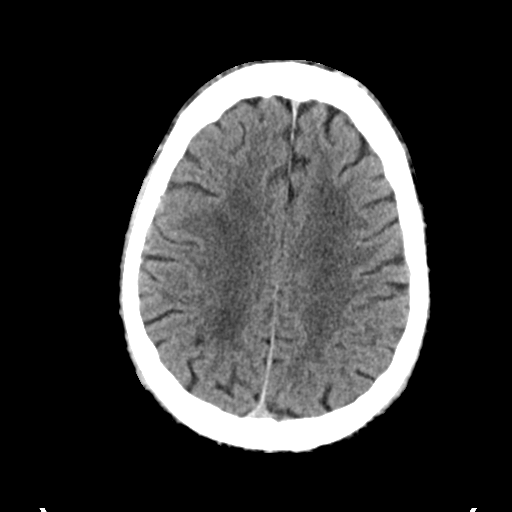
[im 24/34  brain]
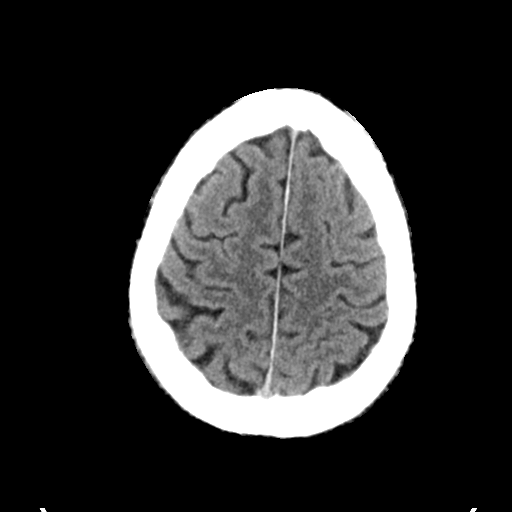
[im 28/34  brain]
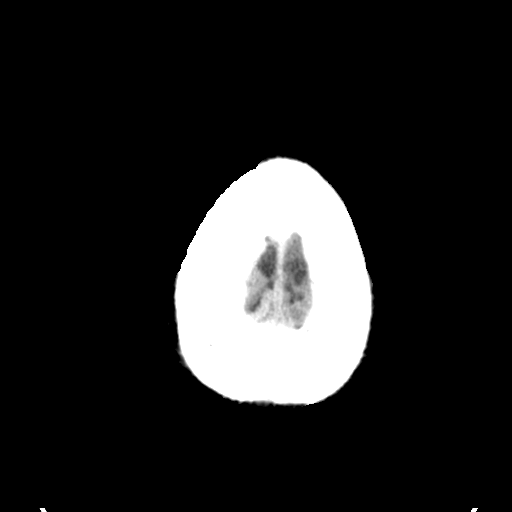
[im 31/34  brain]
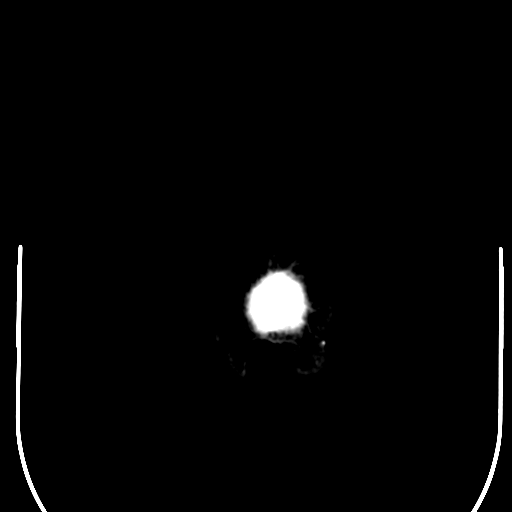
[im 31/34  bone]
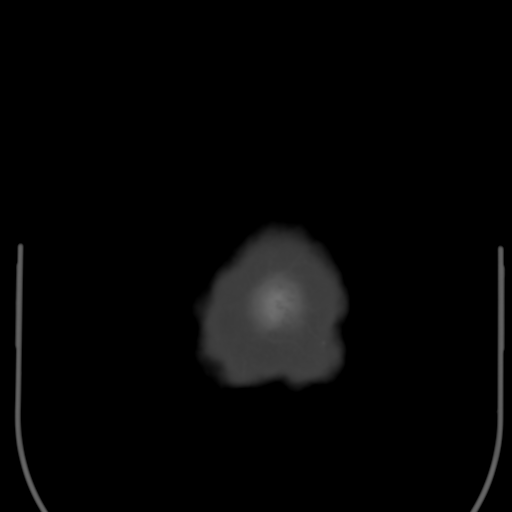

[Series 4: head 3.0 mpr cor · coronal · 0.33mm/px · 3 of 70 slices shown]
[im 24/70  brain]
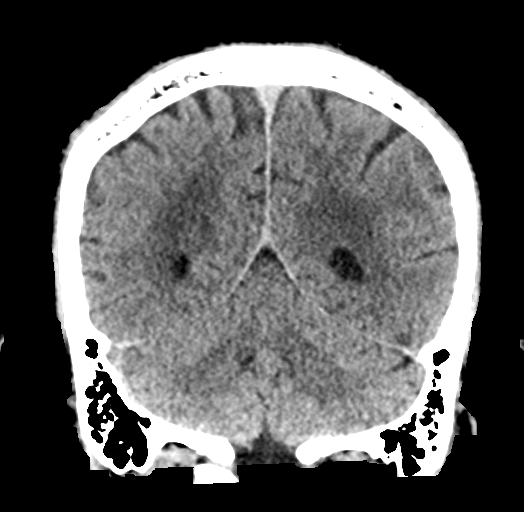
[im 31/70  brain]
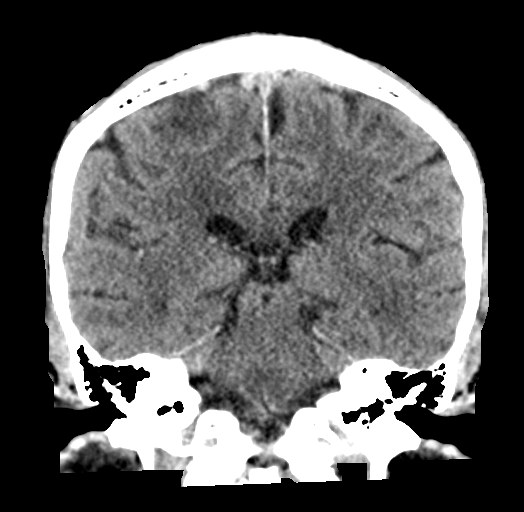
[im 39/70  brain]
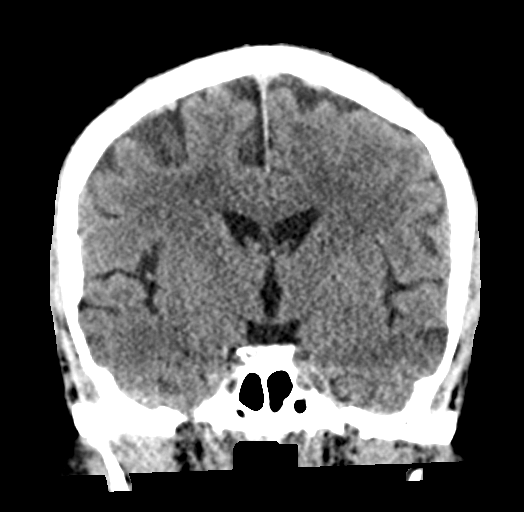

[Series 5: head 3.0 mpr sag · sagittal · 0.33mm/px · 3 of 59 slices shown]
[im 20/59  brain]
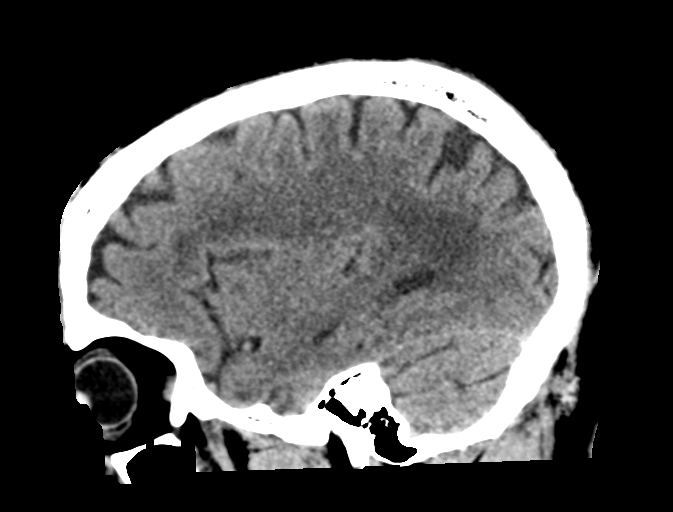
[im 30/59  brain]
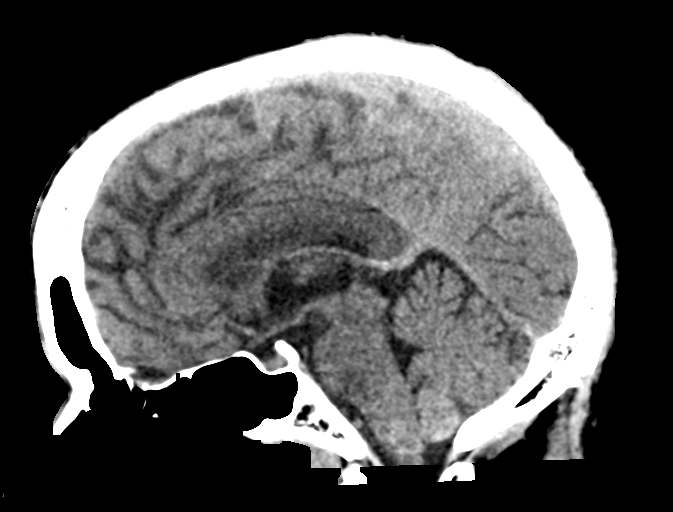
[im 39/59  brain]
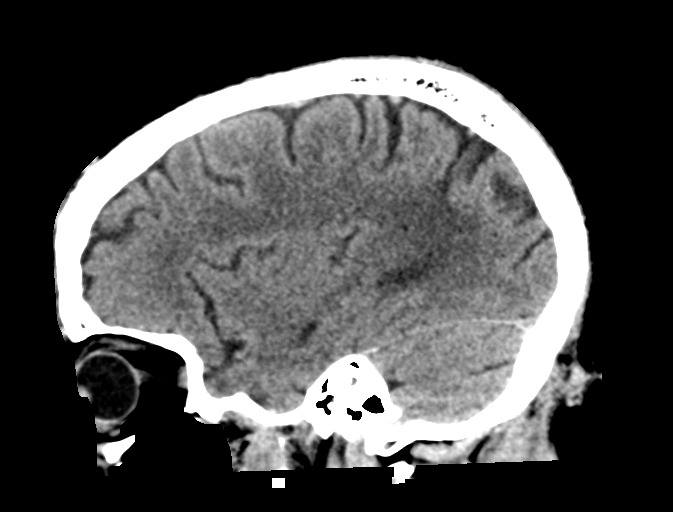

[15 of 47 positions shown; findings below may reference images not displayed]

FINDINGS: Brain: There are no signs of bleeding within the cranium. There is
interval appearance of low-density in the posterior limb of left
internal capsule and left periventricular region. Ventricles are not
dilated. There is no shift of midline structures. Cortical sulci are
prominent. There is decreased density in the periventricular and
subcortical white matter suggesting small-vessel disease.

Vascular: Unremarkable.

Skull: No fracture is seen.

Sinuses/Orbits: Unremarkable.

Other: None
IMPRESSION: There is interval appearance 1.6 cm focus of decreased attenuation
in the posterior limb of left internal capsule and left
periventricular region suggesting evolving infarct in the left MCA
distribution. Follow-up MRI may be considered. There are no signs of
bleeding within the cranium.

Atrophy.  Small-vessel disease.

## 2022-01-14 MED ORDER — ASPIRIN EC 81 MG PO TBEC
81.0000 mg | DELAYED_RELEASE_TABLET | Freq: Every day | ORAL | Status: DC
  Administered 2022-01-15 – 2022-01-22 (×8): 81 mg via ORAL
  Filled 2022-01-14 (×8): qty 1

## 2022-01-14 MED ORDER — ENOXAPARIN SODIUM 40 MG/0.4ML IJ SOSY
40.0000 mg | PREFILLED_SYRINGE | INTRAMUSCULAR | Status: DC
  Administered 2022-01-15 – 2022-01-21 (×7): 40 mg via SUBCUTANEOUS
  Filled 2022-01-14 (×7): qty 0.4

## 2022-01-14 MED ORDER — ACETAMINOPHEN 160 MG/5ML PO SOLN: 650.0000 mg | ORAL | Status: DC | PRN

## 2022-01-14 MED ORDER — ATORVASTATIN CALCIUM 80 MG PO TABS
80.0000 mg | ORAL_TABLET | Freq: Every day | ORAL | Status: DC
Start: 2022-01-15 — End: 2022-01-18
  Administered 2022-01-15 – 2022-01-17 (×3): 80 mg via ORAL
  Filled 2022-01-14 (×3): qty 1

## 2022-01-14 MED ORDER — POTASSIUM CHLORIDE 20 MEQ PO PACK: 20.0000 meq | PACK | Freq: Once | ORAL | Status: DC

## 2022-01-14 MED ORDER — SENNOSIDES-DOCUSATE SODIUM 8.6-50 MG PO TABS: 1.0000 | ORAL_TABLET | Freq: Every evening | ORAL | Status: DC | PRN

## 2022-01-14 MED ORDER — METOPROLOL TARTRATE 25 MG PO TABS
50.0000 mg | ORAL_TABLET | Freq: Once | ORAL | Status: DC
  Filled 2022-01-14: qty 2

## 2022-01-14 MED ORDER — INSULIN ASPART 100 UNIT/ML IJ SOLN
0.0000 [IU] | Freq: Three times a day (TID) | INTRAMUSCULAR | Status: DC
  Administered 2022-01-15 (×2): 3 [IU] via SUBCUTANEOUS
  Administered 2022-01-16: 2 [IU] via SUBCUTANEOUS
  Administered 2022-01-16: 5 [IU] via SUBCUTANEOUS
  Administered 2022-01-16 – 2022-01-17 (×4): 2 [IU] via SUBCUTANEOUS
  Administered 2022-01-18: 3 [IU] via SUBCUTANEOUS
  Administered 2022-01-18 – 2022-01-19 (×4): 2 [IU] via SUBCUTANEOUS
  Administered 2022-01-20: 5 [IU] via SUBCUTANEOUS
  Administered 2022-01-20 – 2022-01-21 (×2): 2 [IU] via SUBCUTANEOUS
  Administered 2022-01-21: 3 [IU] via SUBCUTANEOUS
  Administered 2022-01-22 (×2): 2 [IU] via SUBCUTANEOUS

## 2022-01-14 MED ORDER — ACETAMINOPHEN 650 MG RE SUPP: 650.0000 mg | RECTAL | Status: DC | PRN

## 2022-01-14 MED ORDER — STROKE: EARLY STAGES OF RECOVERY BOOK
Freq: Once | Status: AC
  Filled 2022-01-14: qty 1

## 2022-01-14 MED ORDER — ACETAMINOPHEN 325 MG PO TABS: 650.0000 mg | ORAL_TABLET | ORAL | Status: DC | PRN

## 2022-01-14 MED ORDER — SODIUM CHLORIDE 0.9 % IV BOLUS
500.0000 mL | Freq: Once | INTRAVENOUS | Status: AC
  Administered 2022-01-14: 500 mL via INTRAVENOUS

## 2022-01-14 MED ORDER — CLOPIDOGREL BISULFATE 75 MG PO TABS
75.0000 mg | ORAL_TABLET | Freq: Every day | ORAL | Status: DC
  Administered 2022-01-15 – 2022-01-22 (×8): 75 mg via ORAL
  Filled 2022-01-14 (×8): qty 1

## 2022-01-14 NOTE — ED Triage Notes (Signed)
D/C'D after stroke on 4/26 ?Pt arrived via EMS today with c/o worsening deficits over past 2 days with more difficulty walking due to worsening right sided weakness.    ?

## 2022-01-14 NOTE — Consult Note (Addendum)
Neurology Stroke Consult Note ? ?Ronney Lion ?MR# [id number] ?01/14/2022 ? ? ?CC: concern for increased weakness over the last few days ? ?History is obtained from: Patient, Wife and chart. ? ?HPI: Joseph Patrick is a 76 y.o. male PMHx of hypertension, diabetes, recent CVA (discharged from the hospital on January 10, 2022), presents with complaint of worsening right-sided weakness. Patient and wife report that he originally came to the emergency room on April 22 after he went to sit down on the bed and slid off of the bed, came in and was diagnosed with a small stroke.  Patient was discharged on April 26, states that that day he noticed he was dragging his right foot slightly when he was walking.  He has had progressively worsening right arm and leg weakness since that time.  Denies changes in vision or speech.  No other complaints or concerns today. He is outside the window for a code stroke. ? ?NIH Stroke Scale ?1a. Level of Consciousness 0-Alert ?1b. Level of Consciousness Questions 0-Answers both questions correctly ?1c. Level of Consciousness Commands 0-Performs both tasks correctly ?2. Best Gaze 0-Normal ?3. Visual 0-No visual loss ?4. Facial Palsy 0-Normal ?5a. Motor Arm Left 0-No drift ?5b. Motor Arm Right 2-Some effort against gravity but hits bed ?6a. Motor Leg Left 0-No drift ?6b. Motor Leg Right 1-Drift but does not hit bed ?7. Ataxia 1-Present in one limb ?8. Sensory 1-Mild to moderate sensory loss RUE  ?9. Best Language 0-No aphasia ?10. Dysarthria 0-Normal ?11. Extinction and Inattention 0-No abnormality ? ?NIHSS Total: 5 ? ?ROS: A complete ROS was performed and is negative except as noted in the HPI.   ?Past Medical History:  ?Diagnosis Date  ? CVA (cerebral vascular accident) [facility])   ? april 2023  ? Diabetes mellitus without complication ([place])   ? Hypertension   ? ? ? ?Family History  ?Problem Relation Age of Onset  ? Heart failure Sister   ? ? ?Social History:  reports that he has never smoked. He has  never used smokeless tobacco. He reports that he does not drink alcohol and does not use drugs. ? ? ?Prior to Admission medications   ?Medication Sig Start Date End Date Taking? Authorizing Provider  ?aspirin 81 MG EC tablet Take 1 tablet (81 mg total) by mouth daily. Swallow whole. 01/11/22   Masters, Joellen Jersey, DO  ?atorvastatin (LIPITOR) 80 MG tablet Take 1 tablet (80 mg total) by mouth daily. 01/10/22   Masters, Joellen Jersey, DO  ?clopidogrel (PLAVIX) 75 MG tablet Take 1 tablet (75 mg total) by mouth daily for 18 days. 01/11/22 01/29/22  Masters, Joellen Jersey, DO  ?dapagliflozin propanediol (FARXIGA) 5 MG TABS tablet Take 1 tablet (5 mg total) by mouth daily before breakfast. 01/10/22   Masters, Joellen Jersey, DO  ?isosorbide-hydrALAZINE (BIDIL) 20-37.5 MG tablet Take 1 tablet by mouth 3 (three) times daily. 01/10/22   Masters, Joellen Jersey, DO  ?metoprolol tartrate (LOPRESSOR) 50 MG tablet Take 1 tablet (50 mg total) by mouth 2 (two) times daily. 01/10/22   Masters, Katie, DO  ?sacubitril-valsartan (ENTRESTO) 24-26 MG Take 1 tablet by mouth 2 (two) times daily. 01/10/22   Masters, Joellen Jersey, DO  ? ? ?Exam: ?Current vital signs: ?Current vital signs: ?BP (!) 186/85   Pulse 81   Temp 99 ?F (37.2 ?C) (Oral)   Resp 14   Ht 5\' 8"  (1.727 m)   Wt 81.6 kg   SpO2 99%   BMI 27.37 kg/m?  ?Vital signs in last 24 hours: ?Temp:  [  97.3 ?F (36.3 ?C)-99 ?F (37.2 ?C)] 99 ?F (37.2 ?C) (04/30 2036) ?Pulse Rate:  [id number] (04/30 2036) ?Resp:  [14-27] 14 (04/30 2036) ?BP: (181-197)/(85-123) 186/85 (04/30 2039) ?SpO2:  [94 %-100 %] 99 % (04/30 2036) ?Weight:  [81.6 kg] 81.6 kg (04/30 1114) ? ?Physical Exam  ?Constitutional: Appears well-developed and well-nourished.  ?Psych: Affect appropriate to situation ?Eyes: No scleral injection ?HENT: No OP obstruction. ?Head: Normocephalic.  ?Cardiovascular: Normal rate and regular rhythm.  ?Respiratory: Effort normal, symmetric excursions bilaterally, no audible wheezing. ?GI: Soft.  No distension. There is no tenderness.   ?Skin: WDI ? ?Neuro: ?Mental Status: ?Patient is awake, alert, oriented to person, place, month, year, and situation. ?Patient is able to give a clear and coherent history. ?Speech is fluent, intact comprehension and repetition. ?No signs of aphasia or neglect. ?Visual Fields are full. Pupils are equal, round, and reactive to light. ?EOMI without ptosis or diploplia.  ?Facial sensation is symmetric to temperature ?Facial movement is symmetric.  ?Hearing is intact to voice. ?Uvula midline and palate elevates symmetrically. ?Shoulder shrug is symmetric. ?Tongue is midline without atrophy or fasciculations.  ?Tone is normal. Bulk is normal. 5/5 strength was present in both Left side extremities.4-/5 Right leg and 3+/5 Right arm ?Sensation is symmetric to light touch and temperature in the arms and legs. ?Toes are downgoing bilaterally. ?HKS are intact bilaterally.FTN ataxia noted right arm ?Gait - Deferred and patient and wife describe sliding of right leg when walking due to weakness ? ?I have reviewed labs in epic and the pertinent results are: ?Creatinine- 1.40, Bld sugar- 137, Potassium 3.8, PT/INR- 13.9/1.1, Ethanol- <10, Covid neg ? ?I have reviewed the images obtained and per radiology report: ?NCT head showed -There is interval appearance 1.6 cm focus of decreased attenuation ?in the posterior limb of left internal capsule and left ?periventricular region suggesting evolving infarct in the left MCA ?distribution. There are no signs of bleeding within the cranium. ?MRI brain WO contrast personally reviewed by attending MD with patient at bedside  ?Acute infarction involving the left corona radiata, caudate body, ?and posterior lentiform nucleus. ? ?Assessment: Joseph Patrick is a 76 y.o. male PMHx HTN and DM, noncompliance with medications for 3-5 months prior to a lacunar stroke 01/07/22 presenting back to [facility] ED with progressive right side arm/leg weakness. CT shows evolution of prior stroke. On my exam right  sided weakness noted and No facial droop noted. Ataxia Right arm evidenced on FTN. He denies any numbness, tingling, pins and needles, headache, weakness, slurred speech.  ? ?MRI brain confirms a acute infarction of the left corona radiata.  The clinical time course and risk factors are consistent with a stuttering lacunar stroke, for which the patient is being appropriately treated.  There is a small possibility that he is a Plavix nonresponder, and therefore we will check for this with P2Y12 level.  Otherwise no further stroke work-up is necessary ? ?Impression:  ?- Stuttering lacunar stroke. Stroke work up completed last week includes: Hgb A1C-7.1 and improved from 9.9 previously, lipid panel- LDL-127/Trig- 82, and Chol-186(on Lipitor 80 mg daily). No need to repeat workup given imaging consistent with stuttering lacunar stroke, now completed.  ? ?Plan: ?- MRI brain without contrast completed as above ?- TSH pending, primary team to follow-up ?- P2Y12 level to confirm patient is a Plavix responder, this can be followed up by primary team.  If patient is a Plavix nonresponder, should be switched to ticagrelor ?- Continue Aspirin 81mg  daily. ?-  Continue Clopidogrel 75mg  daily for 14 more days, unless there is a switch to ticagrelor based on P2 Y12 level ?- Patient is out of the permissive hypertension window at this time.  Gradually normalize blood pressure over 35 days ?- Telemetry monitoring for arrhythmia. ?- Recommend PT/OT/SLP consult. ?-Neurology will be available on an as-needed basis going forward, please reach out if new questions or concerns arise ? ?Electronically signed by:  ?Parke Poisson, Neuro NP  ?Available via Epic  ?01/14/2022, 3:15 PM ?If 7pm- 7am, please page neurology on call as listed in [place].  ? ?Attending Neurologist's note: ? ?I personally saw this patient, gathering history, performing the NIH examination documented above, reviewing relevant labs, personally reviewing relevant imaging  including head CT and MRI brain, and edited the NP plan for accuracy.  ? ?

## 2022-01-14 NOTE — ED Provider Notes (Signed)
?[facility] ?Provider Note ? ? ?CSN: [id number] ?Arrival date & time: 01/14/22  1106 ? ?  ? ?History ? ?Chief Complaint  ?Patient presents with  ? Neurologic Problem  ? Weakness  ? ? ?Joseph Patrick is a 76 y.o. male. ? ?76 year old male with past medical history of hypertension, diabetes, recent CVA (discharged from the hospital on January 10, 2022), presents with complaint of worsening right-sided weakness.  Patient states that he originally came to the emergency room on April 22 when he went to sit down on the bed and slid off of the bed, came in and was diagnosed with a small stroke.  Patient was discharged on April 26, states that that day he noticed he was dragging his right foot slightly when he was walking.  He has had progressively worsening right arm and leg weakness since that time.  Denies changes in vision or speech.  No other complaints or concerns today. ? ? ?  ? ?Home Medications ?Prior to Admission medications   ?Medication Sig Start Date End Date Taking? Authorizing Provider  ?aspirin 81 MG EC tablet Take 1 tablet (81 mg total) by mouth daily. Swallow whole. 01/11/22   Masters, Florentina AddisonKatie, DO  ?atorvastatin (LIPITOR) 80 MG tablet Take 1 tablet (80 mg total) by mouth daily. 01/10/22   Masters, Florentina AddisonKatie, DO  ?clopidogrel (PLAVIX) 75 MG tablet Take 1 tablet (75 mg total) by mouth daily for 18 days. 01/11/22 01/29/22  Masters, Florentina AddisonKatie, DO  ?dapagliflozin propanediol (FARXIGA) 5 MG TABS tablet Take 1 tablet (5 mg total) by mouth daily before breakfast. 01/10/22   Masters, Florentina AddisonKatie, DO  ?isosorbide-hydrALAZINE (BIDIL) 20-37.5 MG tablet Take 1 tablet by mouth 3 (three) times daily. 01/10/22   Masters, Florentina AddisonKatie, DO  ?metoprolol tartrate (LOPRESSOR) 50 MG tablet Take 1 tablet (50 mg total) by mouth 2 (two) times daily. 01/10/22   Masters, Katie, DO  ?sacubitril-valsartan (ENTRESTO) 24-26 MG Take 1 tablet by mouth 2 (two) times daily. 01/10/22   Masters, Florentina AddisonKatie, DO  ?   ? ?Allergies    ?Patient has  no known allergies.   ? ?Review of Systems   ?Review of Systems ?Negative except as per HPI ?Physical Exam ?Updated Vital Signs ?BP (!) 193/99   Pulse 62   Temp 98 ?F (36.7 ?C) (Oral)   Resp (!) 27   Ht 5\' 8"  (1.727 m)   Wt 81.6 kg   SpO2 100%   BMI 27.37 kg/m?  ?Physical Exam ?Vitals and nursing note reviewed.  ?Constitutional:   ?   General: He is not in acute distress. ?   Appearance: He is well-developed. He is not diaphoretic.  ?HENT:  ?   Head: Normocephalic and atraumatic.  ?Eyes:  ?   Extraocular Movements: Extraocular movements intact.  ?   Pupils: Pupils are equal, round, and reactive to light.  ?Cardiovascular:  ?   Rate and Rhythm: Normal rate and regular rhythm.  ?   Heart sounds: Normal heart sounds.  ?Pulmonary:  ?   Effort: Pulmonary effort is normal.  ?   Breath sounds: Normal breath sounds.  ?Abdominal:  ?   Palpations: Abdomen is soft.  ?   Tenderness: There is no abdominal tenderness.  ?Musculoskeletal:     ?   General: No swelling, tenderness or deformity.  ?   Cervical back: Neck supple.  ?Skin: ?   General: Skin is warm and dry.  ?   Findings: No erythema or rash.  ?Neurological:  ?  Mental Status: He is alert and oriented to person, place, and time.  ?   GCS: GCS eye subscore is 4. GCS verbal subscore is 5. GCS motor subscore is 6.  ?   Cranial Nerves: Cranial nerves 2-12 are intact.  ?   Sensory: No sensory deficit.  ?   Motor: Weakness present.  ?   Comments: Right arm and leg weak compared to left, sensation intact  ?Psychiatric:     ?   Behavior: Behavior normal.  ? ? ?ED Results / Procedures / Treatments   ?Labs ?(all labs ordered are listed, but only abnormal results are displayed) ?Labs Reviewed  ?COMPREHENSIVE METABOLIC PANEL - Abnormal; Notable for the following components:  ?    Result Value  ? CO2 21 (*)   ? Glucose, Bld 136 (*)   ? Creatinine, Ser 1.43 (*)   ? GFR, Estimated 51 (*)   ? All other components within normal limits  ?I-STAT CHEM 8, ED - Abnormal; Notable for  the following components:  ? Creatinine, Ser 1.40 (*)   ? Glucose, Bld 137 (*)   ? Calcium, Ion 1.00 (*)   ? All other components within normal limits  ?RESP PANEL BY RT-PCR (FLU A&B, COVID) ARPGX2  ?ETHANOL  ?PROTIME-INR  ?APTT  ?CBC  ?DIFFERENTIAL  ?RAPID URINE DRUG SCREEN, HOSP PERFORMED  ?URINALYSIS, ROUTINE W REFLEX MICROSCOPIC  ? ? ?EKG ?None ? ?Radiology ?CT HEAD WO CONTRAST ? ?Result Date: 01/14/2022 ?CLINICAL DATA:  Worsening neurological deficit, worsening of right-sided weakness EXAM: CT HEAD WITHOUT CONTRAST TECHNIQUE: Contiguous axial images were obtained from the base of the skull through the vertex without intravenous contrast. RADIATION DOSE REDUCTION: This exam was performed according to the departmental dose-optimization program which includes automated exposure control, adjustment of the mA and/or kV according to patient size and/or use of iterative reconstruction technique. COMPARISON:  01/06/2022 FINDINGS: Brain: There are no signs of bleeding within the cranium. There is interval appearance of low-density in the posterior limb of left internal capsule and left periventricular region. Ventricles are not dilated. There is no shift of midline structures. Cortical sulci are prominent. There is decreased density in the periventricular and subcortical white matter suggesting small-vessel disease. Vascular: Unremarkable. Skull: No fracture is seen. Sinuses/Orbits: Unremarkable. Other: None IMPRESSION: There is interval appearance 1.6 cm focus of decreased attenuation in the posterior limb of left internal capsule and left periventricular region suggesting evolving infarct in the left MCA distribution. Follow-up MRI may be considered. There are no signs of bleeding within the cranium. Atrophy.  Small-vessel disease. Electronically Signed   By: Ernie Avena M.D.   On: 01/14/2022 13:09   ? ?Procedures ?Procedures  ? ? ?Medications Ordered in ED ?Medications  ?sodium chloride 0.9 % bolus 500 mL (500  mLs Intravenous New Bag/Given 01/14/22 1326)  ? ? ?ED Course/ Medical Decision Making/ A&P ?  ?                        ?Medical Decision Making ?Amount and/or Complexity of Data Reviewed ?Labs: ordered. ?Radiology: ordered. ? ?Risk ?Decision regarding hospitalization. ? ? ?This patient presents to the ED for concern of right arm and leg weakness, this involves an extensive number of treatment options, and is a complaint that carries with it a high risk of complications and morbidity.  The differential diagnosis includes CVA ? ? ?Co morbidities that complicate the patient evaluation ? ?Recent CVA/TIA admission, hypertension, diabetes, CHF ? ? ?Additional  history obtained: ? ?Additional history obtained from niece at bedside who reports worsening right-sided weakness ?External records from outside source obtained and reviewed including prior discharge summary from admission on 4 22-4 25 for stroke work-up ? ? ?Lab Tests: ? ?I Ordered, and personally interpreted labs.  The pertinent results include: See BC unremarkable, CMP with creatinine of 1.43, not significantly changed from baseline. ? ? ?Imaging Studies ordered: ? ?I ordered imaging studies including CT head ?I independently visualized and interpreted imaging which showed left side density, no midline shift ?I agree with the radiologist interpretation-interval appearance 1.6 cm focus of decreased attenuation in the posterior limb of the left internal capsule in the left periventricular region suggesting evolving infarct in the left MCA distribution ? ? ?Cardiac Monitoring: / EKG: ? ?The patient was maintained on a cardiac monitor.  I personally viewed and interpreted the cardiac monitored which showed an underlying rhythm of: Sinus rhythm ? ? ?Consultations Obtained: ? ?I requested consultation with the neuro hospitalist, Dr. Iver Nestle,  and discussed lab and imaging findings as well as pertinent plan - they recommend: MRI brain, admit to hospital for permissive  hypertension/PT OT evaluation, neuro to see ?Hospitalist with internal medicine service consulted, agrees to consult for admission. ? ? ?Problem List / ED Course / Critical interventions / Medication management ?

## 2022-01-14 NOTE — Hospital Course (Addendum)
Acute CVA of left MCA ?History of left frontoparietal infarct ?Patient presented with several days of worsening coordination and weakness in right upper and lower extremity. CT showed 1.6 decreased attenuation in the posterior limb of left internal capsule suggesting evolving infarct in the left MCA.  MRI showed acute infarction involving the left corona radiata, caudate body, and posterior lentiform nucleus  Original symptoms presentation on 4/22 were consistent with current stroke distribution however MRI at that time showed CVA of left frontoparietal area. CT head neck from 4/22 showed no intracranial large vessel occlusion, but showed moderate/severe stenosis into left MCA and severe stenosis of left PCA. Neurology was consulted. There was concern for Plavix non-responder given recurrent CVA despite on DAPT therapy -> P2Y12 test resulted below the reference range indicating Plavix is working as expected. Neurology signed off, recommending medical therapy with no further work up at this time.  ?-- Continue ASA 81 mg daily indefinitely  ?-- Continue clopidogrel 75 mg, last day 5/14  ? ?Anticipate further improvement in symptoms with PT/OT, and with CIR.  ?  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 Home medications include sacubitril-valsartan, metoprolol tartrate (switched to succinate during admission for evidence based medicine), isosorbide-hydralazine, and dapagliflozin. Echo from 4/23 showed EF of 25 to 30% with global hypokinesis of left ventricle with functional mitral valve regurgitation.  Heart failure attributed to longstanding uncontrolled hypertension. Follows with Dr. Eden Emms since last admission. Euvolemic this admission, with stable weight. Medications are re-started at time of discharge.  ? ?Severe asymptomatic hypertension, resolved  ?Hx of HTN ?Blood pressure is elevated into the 190's/90's on admission. He reports taking his blood pressure medications after being discharged from the hospital  01/10/22. Systolic blood pressure readings at home have varied in the 160-200's. Outside of permissive HTN window given delayed presentation. Re-started  sacubitril-valsartan BID, metoprolol succinate 100 mg QD, and isosorbide-hydralazine TID. Up-titrated his Entresto, with better control of his BP.  ?  ?Type 2 diabetes ?Medications include dapagliflozin which was continued. Recent Hemoglobin A1c at 7.1. SSI during admission with good control, CGM's <180.  ? ?CKD 3a ?Chronic and stable at baseline of 1.4-1.5. At baseline.  ?

## 2022-01-14 NOTE — H&P (Signed)
? ?Date: 01/14/2022     ?     ?     ?Patient Name:  Joseph Patrick MRN: [id number]  ?DOB: [birth year] Age / Sex: 76 y.o., male   ?PCP: Arthur Holms, NP    ?     ?Medical Service: Internal Medicine Teaching Service    ?     ?Attending Physician: Dr. Lucrezia Starch, MD    ?First Contact: [name], MD Pager: MP [phone]  ?Second Contact: Linwood Dibbles, MD Pager: PA [phone]  ?     ?After Hours (After 5p/  First Contact Pager: [phone]  ?weekends / holidays): Second Contact Pager: [phone]  ? ?SUBJECTIVE  ?Chief Complaint: right arm and leg numbness/ weakness ? ?History of Present Illness: Ole Debold is a 76 y.o. male with a pertinent PMH of hypertension, hyperlipidemia, well-controlled diabetes, and CKD stage 3a, who presents to [facility] with weakness of his right arm and leg. ? ?Patient was discharged from hospital on Wednesday, April 26, following CVA in left frontoparietal region.  He states that once he got home, his right foot started to feel tingly as he was walking into his apartment.  This continued to worsen and by Friday he was using his sisters cane due to difficulty walking.  He called EMS on Friday due to symptoms.  He states that blood pressure was a little over 200 when EMS checked it.  He was not brought to the hospital at that time.  He has noted increased difficulty eating due to weakness in his right arm.  After getting home from the hospital patient states that he has been taking the new medications that he was prescribed regularly.  His niece and sister have helped him with logging blood pressures at home. He states that systolic blood pressure has been between 160-210 at home. ? ?He denies blurry vision, headache, chest pain, shortness of breath, and dizziness. ? ?Medications: ?Aspirin 81 mg ?Atorvastatin 80 mg ?Clopidogrel 75 mg ?Dapagliflozin 5 mg ?Isosorbide-hydralazine 20-37.5 mg ?Metoprolol tartrate 50 mg twice daily ?Sacubitril-valsartan 24-26 mg twice daily ? ?Past Medical History:  ?Past  Medical History:  ?Diagnosis Date  ? CVA (cerebral vascular accident) [facility])   ? april 2023  ? Diabetes mellitus without complication ([place])   ? Hypertension   ? ? ?Social:  ?Lives - [place], by himself but his sister lives in the same apartment complex ?Occupation - retired, worked in Architect previously, enjoys golfing now ?Support - sister and niece ?Level of function - independent in ADLs and IADLs ?PCP - Arthur Holms, NP ?Substance use - does not smoke, drink EtOH, or use illicit drugs ? ?Family History: ?Family History  ?Problem Relation Age of Onset  ? Heart failure Sister   ? ? ?Allergies: ?Allergies as of 01/14/2022  ? (No Known Allergies)  ? ? ?Review of Systems: ?A complete ROS was negative except as per HPI.  ? ?OBJECTIVE:  ?Physical Exam: ?Blood pressure (!) 193/99, pulse 62, temperature 98 ?F (36.7 ?C), temperature source Oral, resp. rate (!) 27, height 5\' 8"  (1.727 m), weight 81.6 kg, SpO2 100 %. ?Constitutional: Well-appearing, sitting up in bed with niece at bedside ?HENT: normocephalic, mucous membranes moist ?Neck: no JVD ?Cardiovascular: regular rate and rhythm, 2/6 systolic murmur appreciated in left sternal border ?Pulmonary/Chest: normal work of breathing on room air, lungs clear to auscultation bilaterally ?MSK: no edema present in lower extremities bilaterally ?Neurological: alert & oriented x 3, CN 2-12 intact, 5/5 strength in left upper and lower extremity, 5/5  strength in right upper extremity, 4/5 in right lower extremity, abnormal finger to nose testing on right and abnormal heel to shin test on right ?Skin: warm and dry ? ?Pertinent Labs: ?CBC ?   ?Component Value Date/Time  ? WBC 7.5 01/14/2022 1135  ? RBC 5.05 01/14/2022 1135  ? HGB 15.6 01/14/2022 1145  ? HCT 46.0 01/14/2022 1145  ? PLT 234 01/14/2022 1135  ? MCV 90.1 01/14/2022 1135  ? MCH 29.9 01/14/2022 1135  ? MCHC 33.2 01/14/2022 1135  ? RDW 12.6 01/14/2022 1135  ? LYMPHSABS 1.8 01/14/2022 1135  ? MONOABS 0.4 01/14/2022 1135   ? EOSABS 0.1 01/14/2022 1135  ? BASOSABS 0.0 01/14/2022 1135  ?  ? ?CMP  ?   ?Component Value Date/Time  ? NA 138 01/14/2022 1145  ? NA 140 03/28/2020 1552  ? K 4.0 01/14/2022 1145  ? CL 106 01/14/2022 1145  ? CO2 21 (L) 01/14/2022 1135  ? GLUCOSE 137 (H) 01/14/2022 1145  ? BUN 19 01/14/2022 1145  ? BUN 18 03/28/2020 1552  ? CREATININE 1.40 (H) 01/14/2022 1145  ? CALCIUM 8.9 01/14/2022 1135  ? PROT 7.4 01/14/2022 1135  ? ALBUMIN 3.6 01/14/2022 1135  ? AST 27 01/14/2022 1135  ? ALT 36 01/14/2022 1135  ? ALKPHOS 70 01/14/2022 1135  ? BILITOT 1.1 01/14/2022 1135  ? GFRNONAA 51 (L) 01/14/2022 1135  ? GFRAA 54 (L) 03/28/2020 1552  ? ? ?Pertinent Imaging: ?CT HEAD WO CONTRAST ? ?Result Date: 01/14/2022 ?CLINICAL DATA:  Worsening neurological deficit, worsening of right-sided weakness EXAM: CT HEAD WITHOUT CONTRAST TECHNIQUE: Contiguous axial images were obtained from the base of the skull through the vertex without intravenous contrast. RADIATION DOSE REDUCTION: This exam was performed according to the departmental dose-optimization program which includes automated exposure control, adjustment of the mA and/or kV according to patient size and/or use of iterative reconstruction technique. COMPARISON:  01/06/2022 FINDINGS: Brain: There are no signs of bleeding within the cranium. There is interval appearance of low-density in the posterior limb of left internal capsule and left periventricular region. Ventricles are not dilated. There is no shift of midline structures. Cortical sulci are prominent. There is decreased density in the periventricular and subcortical white matter suggesting small-vessel disease. Vascular: Unremarkable. Skull: No fracture is seen. Sinuses/Orbits: Unremarkable. Other: None IMPRESSION: There is interval appearance 1.6 cm focus of decreased attenuation in the posterior limb of left internal capsule and left periventricular region suggesting evolving infarct in the left MCA distribution. Follow-up  MRI may be considered. There are no signs of bleeding within the cranium. Atrophy.  Small-vessel disease. Electronically Signed   By: Elmer Picker M.D.   On: 01/14/2022 13:09   ? ?EKG: personally reviewed my interpretation is sinus rhythm. ? ?ASSESSMENT & PLAN:  ?Assessment: ?Active Problems: ?  * No active hospital problems. * ? ?Dequavius Cure is a 76 y.o. male with a pertinent PMH of hypertension, hyperlipidemia, well-controlled diabetes, and CKD stage 3a, who presents to [facility] with weakness of his right arm and leg on hospital day 0 ? ?Plan: ? ?Acute CVA of left MCA ?History of left frontoparietal infarct ?Patient presented with several days of worsening ordination and weakness in right upper and lower extremity.  ?CT showed 1.6 decreased attenuation in the posterior limb of left internal capsule suggesting evolving infarct in the left MCA.  MRI showed acute infarction involving the left corona radiata, caudate body, and posterior lentiform nucleus  Original symptoms presentation on 4/22 were consistent with current stroke  distribution however MRI at that time showed CVA of left frontoparietal area. CT head neck from 4/22 showed no intracranial large vessel occlusion, but showed moderate/severe stenosis into left MCA and severe stenosis of left PCA. Patient was seen by neurology today, appreciate their recommendations. He will need to be evaluated by PT/OT to determine where would be best for him to receive therapy. ?-Continue aspirin 81 mg daily ?-Continue clopidogrel 75 mg ?-Permissive hypertension, BP goal less than 220/110 ?-neurology following ?-Telemetry monitoring ?-bedside swallow  ?-PT/OT/SLP evaluation pending ?-TSH pending ? ?Heart failure with reduced ejection fraction ?Home medications include sacubitril-valsartan, metoprolol tartrate, isosorbide-hydralazine, and dapagliflozin.  Echo from 4/23 showed EF of 25 to 30% with global hypokinesis of left ventricle with functional mitral valve  regurgitation.  Heart failure attributed to longstanding uncontrolled hypertension.  Follows with Dr. Johnsie Cancel since last admission.  On exam he appears euvolemic.  Weight at discharge 6 days ago was 79 kg and he

## 2022-01-14 NOTE — Progress Notes (Signed)
? ?Subjective: ?Overnight events: none  ? ?Patient was seen at bedside during rounds today. Pt reports feeling the same. He can move his right side very little. States the weakness is better compared to yesterday. He reports some tingling around mouth after eating that went away after a few seconds this AM. Reports no change in deficits.  ? ?Pt is updated on the plan for today, and all questions and concerns are addressed.  ? ?Objective: ? ?Vital signs in last 24 hours: ?Vitals:  ? 01/15/22 0603 01/15/22 0755 01/15/22 1139 01/15/22 1230  ?BP: (!) 171/87 (!) 164/85 (!) 189/100 (!) 183/97  ?Pulse: (!) 59 75 71   ?Resp: 15 17 20    ?Temp: 98.4 ?F (36.9 ?C) 98.4 ?F (36.9 ?C) 98 ?F (36.7 ?C)   ?TempSrc: Oral Oral Oral   ?SpO2: 99% 99% 99%   ?Weight: 75.4 kg     ?Height:      ? ?Constitutional: alert, well appearing, and in no acute distress  ?HENT: normocephalic, mucous membranes moist ?Neck: no JVD ?Cardiovascular: RRR, 2/6 systolic murmur appreciated in left sternal border ?Pulmonary/Chest: normal work of breathing on room air, LCTAB ?MSK: no edema present in lower extremities bilaterally ?Neurological: alert & oriented x 3, CN 2-12 intact, sensation intact. 5/5 strength in left upper and lower extremity, 4/5 strength in right upper extremity, 4/5 in right lower extremity (exam appears to be largely unchanged from admission).  ?Skin: warm and dry ? ?Assessment/Plan: ? ?Principal Problem: ?  Acute ischemic left MCA stroke ([place]) ? ?Joseph Patrick is a 76 y.o. male with a pertinent PMH of hypertension, hyperlipidemia, well-controlled diabetes, and CKD stage 3a admitted for acute CVA of the left MCA.  ? ?Acute CVA of left MCA ?Hx of left frontoparietal infarct ?Patient presented with several days of worsening coordination and weakness in right upper and lower extremity. CT suggestive of evolving infarct in the left MCA, and MRI showed acute infarction involving the left corona radiata, caudate body, and posterior lentiform  nucleus  Original symptoms presentation on 4/22 were consistent with current stroke distribution however MRI at that time showed CVA of left frontoparietal area. CT head neck from 4/22 showed no intracranial large vessel occlusion, but showed moderate/severe stenosis into left MCA and severe stenosis of left PCA. Concern for Plavix nonresponder -> P2Y12 pending. Neurology signed off, recommending medical therapy with no further work up at this time. Anticipate further improvement in symptoms with PT/OT.  ?-Continue ASA 81 mg daily ?-Continue clopidogrel 75 mg, last day 5/14 ?-Outside of permissive hypertension window, BP goal < 220/110 ?-Telemetry monitoring ?-PT/OT to see him today  ?-TSH pending ?  ?Chronic HFrEF ?Echo 4/23 with EF of 25 to 30% with global hypokinesis of left ventricle with functional mitral valve regurgitation. Follows with Dr. Johnsie Cancel, and is on sacubitril-valsartan, metoprolol tartrate, isosorbide-hydralazine, and dapagliflozin. Euvolemic on exam with stable weight.  ?-Will re-start his GDMT since outside of permissive hypertension window ?-Strict I's and O's ?-Daily weights ?-Daily BMP ?-Goal magnesium >2 and K>4  ?  ?Severe asymptomatic hypertension ?BP 190/90's on admission, with systolic home BP readings 160's-200's. BP 170-180/90's today. Will restart some of his BP medications at this time as he is outside of the window for permissive HTN.  ?- Started back Bidil, Entresto, and metoprolol tartrate.  ?  ?T2DM ?Recent Hemoglobin A1c at 7.1. On dapagliflozin at home.  ?-SSI ?-Holding dapagliflozin ?  ?CKD is stage III ?NAGMA ?Cr trending up 1.43 -> 1.55, but within baseline of  1.4-1.5. He is euvolemic on exam. Initial labs with bicarb 21 and K 3.8 likely d/t non-anion gap metabolic acidosis secondary to CKD. ?-daily BMP ?  ?Best Practice: ?Diet: Diabetic diet ?IVF: N/A ?VTE: lovenox ?Code: Full ? ?[name], MD  ?Internal Medicine Resident, PGY-1 ?Pager: [phone] ?After 5pm on weekdays  and 1pm on weekends: On Call pager [phone] ?

## 2022-01-15 DIAGNOSIS — E559 Vitamin D deficiency, unspecified: Secondary | ICD-10-CM | POA: Diagnosis not present

## 2022-01-15 DIAGNOSIS — Z6826 Body mass index (BMI) 26.0-26.9, adult: Secondary | ICD-10-CM | POA: Diagnosis not present

## 2022-01-15 DIAGNOSIS — I130 Hypertensive heart and chronic kidney disease with heart failure and stage 1 through stage 4 chronic kidney disease, or unspecified chronic kidney disease: Secondary | ICD-10-CM | POA: Diagnosis present

## 2022-01-15 DIAGNOSIS — Z91148 Patient's other noncompliance with medication regimen for other reason: Secondary | ICD-10-CM | POA: Diagnosis not present

## 2022-01-15 DIAGNOSIS — Z8673 Personal history of transient ischemic attack (TIA), and cerebral infarction without residual deficits: Secondary | ICD-10-CM | POA: Diagnosis not present

## 2022-01-15 DIAGNOSIS — E1122 Type 2 diabetes mellitus with diabetic chronic kidney disease: Secondary | ICD-10-CM | POA: Diagnosis present

## 2022-01-15 DIAGNOSIS — Z888 Allergy status to other drugs, medicaments and biological substances status: Secondary | ICD-10-CM | POA: Diagnosis not present

## 2022-01-15 DIAGNOSIS — I10 Essential (primary) hypertension: Secondary | ICD-10-CM | POA: Diagnosis not present

## 2022-01-15 DIAGNOSIS — G8191 Hemiplegia, unspecified affecting right dominant side: Secondary | ICD-10-CM | POA: Diagnosis present

## 2022-01-15 DIAGNOSIS — M21371 Foot drop, right foot: Secondary | ICD-10-CM | POA: Diagnosis present

## 2022-01-15 DIAGNOSIS — E785 Hyperlipidemia, unspecified: Secondary | ICD-10-CM | POA: Diagnosis present

## 2022-01-15 DIAGNOSIS — Z79899 Other long term (current) drug therapy: Secondary | ICD-10-CM | POA: Diagnosis not present

## 2022-01-15 DIAGNOSIS — Z7982 Long term (current) use of aspirin: Secondary | ICD-10-CM | POA: Diagnosis not present

## 2022-01-15 DIAGNOSIS — R001 Bradycardia, unspecified: Secondary | ICD-10-CM | POA: Diagnosis present

## 2022-01-15 DIAGNOSIS — I69351 Hemiplegia and hemiparesis following cerebral infarction affecting right dominant side: Secondary | ICD-10-CM | POA: Diagnosis not present

## 2022-01-15 DIAGNOSIS — I5042 Chronic combined systolic (congestive) and diastolic (congestive) heart failure: Secondary | ICD-10-CM | POA: Diagnosis present

## 2022-01-15 DIAGNOSIS — Z20822 Contact with and (suspected) exposure to covid-19: Secondary | ICD-10-CM | POA: Diagnosis present

## 2022-01-15 DIAGNOSIS — I63512 Cerebral infarction due to unspecified occlusion or stenosis of left middle cerebral artery: Secondary | ICD-10-CM

## 2022-01-15 DIAGNOSIS — R29704 NIHSS score 4: Secondary | ICD-10-CM | POA: Diagnosis present

## 2022-01-15 DIAGNOSIS — E1169 Type 2 diabetes mellitus with other specified complication: Secondary | ICD-10-CM | POA: Diagnosis not present

## 2022-01-15 DIAGNOSIS — Z8249 Family history of ischemic heart disease and other diseases of the circulatory system: Secondary | ICD-10-CM | POA: Diagnosis not present

## 2022-01-15 DIAGNOSIS — T733XXS Exhaustion due to excessive exertion, sequela: Secondary | ICD-10-CM | POA: Diagnosis not present

## 2022-01-15 DIAGNOSIS — N1831 Chronic kidney disease, stage 3a: Secondary | ICD-10-CM | POA: Diagnosis present

## 2022-01-15 DIAGNOSIS — N183 Chronic kidney disease, stage 3 unspecified: Secondary | ICD-10-CM | POA: Diagnosis not present

## 2022-01-15 DIAGNOSIS — E663 Overweight: Secondary | ICD-10-CM | POA: Diagnosis present

## 2022-01-15 DIAGNOSIS — I679 Cerebrovascular disease, unspecified: Secondary | ICD-10-CM | POA: Diagnosis not present

## 2022-01-15 DIAGNOSIS — Z7902 Long term (current) use of antithrombotics/antiplatelets: Secondary | ICD-10-CM | POA: Diagnosis not present

## 2022-01-15 DIAGNOSIS — E872 Acidosis, unspecified: Secondary | ICD-10-CM | POA: Diagnosis present

## 2022-01-15 DIAGNOSIS — E1165 Type 2 diabetes mellitus with hyperglycemia: Secondary | ICD-10-CM | POA: Diagnosis present

## 2022-01-15 DIAGNOSIS — R270 Ataxia, unspecified: Secondary | ICD-10-CM | POA: Diagnosis present

## 2022-01-15 LAB — BASIC METABOLIC PANEL
Anion gap: 7 (ref 5–15)
BUN: 19 mg/dL (ref 8–23)
CO2: 27 mmol/L (ref 22–32)
Calcium: 9 mg/dL (ref 8.9–10.3)
Chloride: 105 mmol/L (ref 98–111)
Creatinine, Ser: 1.55 mg/dL — ABNORMAL HIGH (ref 0.61–1.24)
GFR, Estimated: 46 mL/min — ABNORMAL LOW (ref >60)
Glucose, Bld: 181 mg/dL — ABNORMAL HIGH (ref 70–99)
Potassium: 3.4 mmol/L — ABNORMAL LOW (ref 3.5–5.1)
Sodium: 139 mmol/L (ref 135–145)

## 2022-01-15 LAB — GLUCOSE, CAPILLARY
Glucose-Capillary: 174 mg/dL — ABNORMAL HIGH (ref 70–99)
Glucose-Capillary: 89 mg/dL (ref 70–99)
Glucose-Capillary: 146 mg/dL — ABNORMAL HIGH (ref 70–99)
Glucose-Capillary: 130 mg/dL — ABNORMAL HIGH (ref 70–99)

## 2022-01-15 LAB — TSH: TSH: 0.854 u[IU]/mL (ref 0.350–4.500)

## 2022-01-15 MED ORDER — SACUBITRIL-VALSARTAN 24-26 MG PO TABS
1.0000 | ORAL_TABLET | Freq: Two times a day (BID) | ORAL | Status: DC
  Administered 2022-01-15 – 2022-01-17 (×6): 1 via ORAL
  Filled 2022-01-15 (×7): qty 1

## 2022-01-15 MED ORDER — ISOSORB DINITRATE-HYDRALAZINE 20-37.5 MG PO TABS
1.0000 | ORAL_TABLET | Freq: Three times a day (TID) | ORAL | Status: DC
  Administered 2022-01-15 – 2022-01-22 (×21): 1 via ORAL
  Filled 2022-01-15 (×21): qty 1

## 2022-01-15 MED ORDER — METOPROLOL TARTRATE 50 MG PO TABS
50.0000 mg | ORAL_TABLET | Freq: Two times a day (BID) | ORAL | Status: AC
  Administered 2022-01-15 – 2022-01-16 (×4): 50 mg via ORAL
  Filled 2022-01-15 (×4): qty 1

## 2022-01-15 MED ORDER — POTASSIUM CHLORIDE CRYS ER 20 MEQ PO TBCR
40.0000 meq | EXTENDED_RELEASE_TABLET | Freq: Two times a day (BID) | ORAL | Status: AC
  Administered 2022-01-15 (×2): 40 meq via ORAL
  Filled 2022-01-15 (×2): qty 2

## 2022-01-15 MED ORDER — DAPAGLIFLOZIN PROPANEDIOL 5 MG PO TABS
5.0000 mg | ORAL_TABLET | Freq: Every day | ORAL | Status: DC
Start: 2022-01-16 — End: 2022-01-22
  Administered 2022-01-16 – 2022-01-22 (×7): 5 mg via ORAL
  Filled 2022-01-15 (×7): qty 1

## 2022-01-15 NOTE — Plan of Care (Signed)
?  Problem: Education: ?Goal: Knowledge of General Education information will improve ?Description: Including pain rating scale, medication(s)/side effects and non-pharmacologic comfort measures ?Outcome: Progressing ?  ?Problem: Health Behavior/Discharge Planning: ?Goal: Ability to manage health-related needs will improve ?Outcome: Progressing ?  ?Problem: Clinical Measurements: ?Goal: Ability to maintain clinical measurements within normal limits will improve ?Outcome: Progressing ?Goal: Will remain free from infection ?Outcome: Progressing ?Goal: Diagnostic test results will improve ?Outcome: Progressing ?Goal: Respiratory complications will improve ?Outcome: Progressing ?Goal: Cardiovascular complication will be avoided ?Outcome: Progressing ?  ?Problem: Activity: ?Goal: Risk for activity intolerance will decrease ?Outcome: Progressing ?  ?Problem: Nutrition: ?Goal: Adequate nutrition will be maintained ?Outcome: Progressing ?  ?Problem: Coping: ?Goal: Level of anxiety will decrease ?Outcome: Progressing ?  ?Problem: Elimination: ?Goal: Will not experience complications related to bowel motility ?Outcome: Progressing ?Goal: Will not experience complications related to urinary retention ?Outcome: Progressing ?  ?Problem: Pain Managment: ?Goal: General experience of comfort will improve ?Outcome: Progressing ?  ?Problem: Safety: ?Goal: Ability to remain free from injury will improve ?Outcome: Progressing ?  ?Problem: Skin Integrity: ?Goal: Risk for impaired skin integrity will decrease ?Outcome: Progressing ?  ?Problem: Education: ?Goal: Knowledge of disease or condition will improve ?Outcome: Progressing ?Goal: Knowledge of secondary prevention will improve (SELECT ALL) ?Outcome: Progressing ?Goal: Knowledge of patient specific risk factors will improve (INDIVIDUALIZE FOR PATIENT) ?Outcome: Progressing ?  ?

## 2022-01-15 NOTE — Progress Notes (Signed)
Pt complaining of right lower lip numbness that is new.  Pt stated numbness happened after he ate.  MD notified.   ?

## 2022-01-15 NOTE — TOC Initial Note (Signed)
Transition of Care (TOC) - Initial/Assessment Note  ? ? ?Patient Details  ?Name: Joseph Patrick ?MRN: [id number] ?Date of Birth: 06/02/1946 ? ?Transition of Care (TOC) CM/SW Contact:    ?Pollie Friar, RN ?Phone Number: ?01/15/2022, 10:40 AM ? ?Clinical Narrative:                 ?Patient just discharged home at the end of last week. Readmitted with new strokes. ?Patient is from home alone. Pt states his sister lives down the hall and can check on him.  ?Pt did not have therapy needs after last admission.  ?Pt drives self and manages his own medications. ?Awaiting PT/OT evals.  ?TOC following. ? ?Expected Discharge Plan: [place] ?Barriers to Discharge: Continued Medical Work up ? ?Patient Goals and CMS Choice ?  ?CMS Medicare.gov Compare Post Acute Care list provided to:: Patient ?Choice offered to / list presented to : Patient ? ?Expected Discharge Plan and Services ?Expected Discharge Plan: [place] ?  ?Discharge Planning Services: CM Consult ?Post Acute Care Choice: IP Rehab ?Living arrangements for the past 2 months: Apartment ?                ?  ?  ?  ?  ?  ?  ?  ?  ?  ?  ? ?Prior Living Arrangements/Services ?Living arrangements for the past 2 months: Apartment ?Lives with:: Self ?Patient language and need for interpreter reviewed:: Yes ?Do you feel safe going back to the place where you live?: Yes      ?  ?Care giver support system in place?: No (comment) ?  ?Criminal Activity/Legal Involvement Pertinent to Current Situation/Hospitalization: No - Comment as needed ? ?Activities of Daily Living ?Home Assistive Devices/Equipment: Cane (specify quad or straight) ?ADL Screening (condition at time of admission) ?Patient's cognitive ability adequate to safely complete daily activities?: No ?Is the patient deaf or have difficulty hearing?: No ?Does the patient have difficulty seeing, even when wearing glasses/contacts?: No ?Does the patient have difficulty concentrating, remembering, or making  decisions?: No ?Patient able to express need for assistance with ADLs?: Yes ?Does the patient have difficulty dressing or bathing?: No ?Independently performs ADLs?: No ?Communication: Independent ?Dressing (OT): Needs assistance ?Is this a change from baseline?: Change from baseline, expected to last <3days ?Grooming: Independent ?Feeding: Independent ?Bathing: Needs assistance ?Is this a change from baseline?: Change from baseline, expected to last <3 days ?Toileting: Needs assistance ?Is this a change from baseline?: Change from baseline, expected to last <3 days ?In/Out Bed: Needs assistance ?Is this a change from baseline?: Change from baseline, expected to last <3 days ?Walks in Home: Needs assistance ?Is this a change from baseline?: Change from baseline, expected to last <3 days ?Does the patient have difficulty walking or climbing stairs?: Yes ?Weakness of Legs: Right ?Weakness of Arms/Hands: Right ? ?Permission Sought/Granted ?  ?  ?   ?   ?   ?   ? ?Emotional Assessment ?Appearance:: Appears stated age ?Attitude/Demeanor/Rapport: Engaged ?Affect (typically observed): Accepting ?Orientation: : Oriented to Self, Oriented to Place, Oriented to  Time, Oriented to Situation ?  ?Psych Involvement: No (comment) ? ?Admission diagnosis:  Acute ischemic left MCA stroke ([place]) [I63.512] ?Cerebrovascular accident (CVA), unspecified mechanism ([place]) [I63.9] ?Patient Active Problem List  ? Diagnosis Date Noted  ? Acute ischemic left MCA stroke ([place]) 01/14/2022  ? CVA (cerebral vascular accident) ([place]) 01/09/2022  ? Heart failure with reduced ejection fraction ([place])   ? Hypertension 01/07/2022  ?  Hyperlipidemia 01/07/2022  ? Diabetes ([place]) 01/07/2022  ? Stage 3a chronic kidney disease (CKD) ([place]) 01/07/2022  ? Cerebral infarction ([place]) 01/06/2022  ? ?PCP:  Arthur Holms, NP ?Pharmacy:   ?[facility] [place] ?[address] [place] ?[place] [address] ?Phone:  [phone] Fax: [phone] ? ? ? ? ?Social Determinants of Health (SDOH) Interventions ?  ? ?Readmission Risk Interventions ?   ? View : No data to display.  ?  ?  ?  ? ? ? ?

## 2022-01-15 NOTE — Evaluation (Signed)
Occupational Therapy Evaluation Patient Details Name: Joseph Patrick MRN: [id number] DOB: June 26, 1946 Today's Date: 01/15/2022   History of Present Illness pt is a 76 y/o male admitted with progressing R arm and leg numbness/weakness.  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  MRI now shows new infarcts in the left corona radiata, caudate body and posterior lentiform nucleus.  PMHx: CVA, DM, HTN   Clinical Impression   Pt independent at baseline with ADLs and functional mobility, although reports using RW/Rollator since d/c'd from hospital on 4/26. Pt lives alone, has sister down the hall at his apartment complex, however she is not able to provide any physical assistance. Pt currently presents with RUE/RLE weakness, decreased coordination, and sensation. Pt demonstrates impulsivity, decreased safety awareness and short term memory deficits. Requires mod A for ADLs, supervision for bed mobility, and min A for sit to stand transfer with RW. Pt able to take uncoordinated steps in place x4 while standing. Pt presenting with impairments listed below, will follow acutely. Recommend AIR at d/c.      Recommendations for follow up therapy are one component of a multi-disciplinary discharge planning process, led by the attending physician.  Recommendations may be updated based on patient status, additional functional criteria and insurance authorization.   Follow Up Recommendations  Acute inpatient rehab (3hours/day)    Assistance Recommended at Discharge Intermittent Supervision/Assistance  Patient can return home with the following Two people to help with walking and/or transfers;A lot of help with bathing/dressing/bathroom;Assistance with cooking/housework;Direct supervision/assist for medications management;Direct supervision/assist for financial management;Help with stairs or ramp for entrance;Assist for transportation    Functional  Status Assessment  Patient has had a recent decline in their functional status and demonstrates the ability to make significant improvements in function in a reasonable and predictable amount of time.  Equipment Recommendations  None recommended by OT (defer to next venue of care)    Recommendations for Other Services Rehab consult     Precautions / Restrictions Precautions Precautions: Fall      Mobility Bed Mobility Overal bed mobility: Needs Assistance Bed Mobility: Rolling, Sidelying to Sit, Sit to Supine   Sidelying to sit: Supervision   Sit to supine: Supervision   General bed mobility comments: LLE assists RLE into/out of bed'    Transfers Overall transfer level: Needs assistance Equipment used: Rolling walker (2 wheels) Transfers: Sit to/from Stand Sit to Stand: Min assist                  Balance Overall balance assessment: Needs assistance Sitting-balance support: No upper extremity supported Sitting balance-Leahy Scale: Good     Standing balance support: Single extremity supported, Bilateral upper extremity supported, During functional activity Standing balance-Leahy Scale: Poor Standing balance comment: reliant on UE assist and or external support                           ADL either performed or assessed with clinical judgement   ADL Overall ADL's : Needs assistance/impaired Eating/Feeding: Set up;Sitting   Grooming: Set up;Sitting   Upper Body Bathing: Minimal assistance;Sitting   Lower Body Bathing: Moderate assistance;Sitting/lateral leans   Upper Body Dressing : Minimal assistance;Sitting;Cueing for compensatory techniques   Lower Body Dressing: Moderate assistance;Sitting/lateral leans   Toilet Transfer: Moderate assistance;BSC/3in1;Stand-pivot;Rolling walker (2 wheels)   Toileting- Clothing Manipulation and Hygiene: Sit to/from stand;Sitting/lateral lean;Minimal assistance       Functional mobility  during ADLs:  Minimal assistance;Moderate assistance;Rolling walker (2 wheels);Cueing for sequencing;Cueing for safety       Vision Ability to See in Adequate Light: 0 Adequate Patient Visual Report: Other (comment);No change from baseline (wears readers) Vision Assessment?: No apparent visual deficits Additional Comments: will further assess, pt reporting no visual deficits     Perception     Praxis      Pertinent Vitals/Pain Pain Assessment Pain Assessment: No/denies pain     Hand Dominance Right   Extremity/Trunk Assessment Upper Extremity Assessment Upper Extremity Assessment: RUE deficits/detail RUE Deficits / Details: R shoulder hiking with flexion, decreased grasp strength and ability to extend fingers in R hand. RUE Coordination: decreased fine motor;decreased gross motor   Lower Extremity Assessment Lower Extremity Assessment: Defer to PT evaluation RLE Deficits / Details: quads/gross extensors 3/5, hams 3-, df 1/5, pf 2/5, hip flexors 2/5, movement in synergy, numbness from shin down RLE Sensation: decreased light touch RLE Coordination: decreased fine motor LLE Deficits / Details: WFL   Cervical / Trunk Assessment Cervical / Trunk Assessment: Normal   Communication Communication Communication: No difficulties   Cognition Arousal/Alertness: Awake/alert Behavior During Therapy: WFL for tasks assessed/performed Overall Cognitive Status: No family/caregiver present to determine baseline cognitive functioning                                 General Comments: pt with no recollection of BEFAST despite PT going over it with pt prior to OT session, will further assess     General Comments  reviewed BEFAST    Exercises     Shoulder Instructions      Home Living Family/patient expects to be discharged to:: Private residence Living Arrangements: Alone Available Help at Discharge: Available PRN/intermittently Type of Home: Apartment Home Access: Elevator      Home Layout: One level     Bathroom Shower/Tub: Runner, broadcasting/film/video: None          Prior Functioning/Environment Prior Level of Function : Independent/Modified Independent;Driving             Mobility Comments: reports use of cane, walker, and rollator ADLs Comments: no assistance        OT Problem List: Impaired UE functional use;Decreased strength;Decreased range of motion;Decreased activity tolerance;Impaired balance (sitting and/or standing);Decreased coordination;Decreased cognition      OT Treatment/Interventions: Therapeutic exercise;Self-care/ADL training;Therapeutic activities;Cognitive remediation/compensation;Balance training;Patient/family education    OT Goals(Current goals can be found in the care plan section) Acute Rehab OT Goals Patient Stated Goal: to get better OT Goal Formulation: With patient Time For Goal Achievement: 01/29/22 Potential to Achieve Goals: Good ADL Goals Pt Will Perform Upper Body Dressing: with min assist;sitting;standing Pt Will Perform Lower Body Dressing: with min assist;sitting/lateral leans;sit to/from stand Pt Will Transfer to Toilet: with min assist;bedside commode;stand pivot transfer Pt Will Perform Toileting - Clothing Manipulation and hygiene: with min guard assist;sitting/lateral leans;sit to/from stand  OT Frequency: Min 3X/week    Co-evaluation              AM-PAC OT "6 Clicks" Daily Activity     Outcome Measure Help from another person eating meals?: None Help from another person taking care of personal grooming?: A Little Help from another person toileting, which includes using toliet, bedpan, or urinal?: A Lot Help from another person bathing (including washing, rinsing, drying)?: A Lot Help from another person to  put on and taking off regular upper body clothing?: A Lot Help from another person to put on and taking off regular lower body clothing?: A Lot 6 Click Score: 15   End of  Session Equipment Utilized During Treatment: Gait belt;Rolling walker (2 wheels) Nurse Communication: Mobility status  Activity Tolerance: Patient tolerated treatment well Patient left: in chair;with call bell/phone within reach;with bed alarm set  OT Visit Diagnosis: Unsteadiness on feet (R26.81);Muscle weakness (generalized) (M62.81);Other abnormalities of gait and mobility (R26.89)                Time: 4098-1191 OT Time Calculation (min): 22 min Charges:  OT General Charges $OT Visit: 1 Visit OT Evaluation $OT Eval Moderate Complexity: 1 [address], OTD, OTR/L Acute Rehab [phone]   Mayer Masker 01/15/2022, 3:14 PM

## 2022-01-15 NOTE — Progress Notes (Addendum)
? ?  Subjective: ?Overnight events: none  ? ?Patient was seen at bedside during rounds today. Pt reports feeling the same, with no noticeable changes in deficits. He can move his right side very little. No trouble with sensation.  ? ?Pt is updated on the plan for today, and all questions and concerns are addressed.  ? ?Objective: ? ?Vital signs in last 24 hours: ?Vitals:  ? 01/15/22 1139 01/15/22 1230 01/15/22 1551 01/15/22 1945  ?BP: (!) 189/100 (!) 183/97 (!) 180/86 (!) 150/80  ?Pulse: 71  63 66  ?Resp: 20  19 20   ?Temp: 98 ?F (36.7 ?C)  98.2 ?F (36.8 ?C) 98.7 ?F (37.1 ?C)  ?TempSrc: Oral  Oral Oral  ?SpO2: 99%  99% 97%  ?Weight:      ?Height:      ? ?Constitutional: alert, well appearing, and in no acute distress  ?HENT: normocephalic, mucous membranes moist ?Neck: no JVD ?Cardiovascular: RRR, 2/6 systolic murmur appreciated in left sternal border ?Pulmonary/Chest: normal work of breathing on room air, LCTAB ?MSK: no edema present in lower extremities bilaterally ?Neurological: alert & oriented x 3, CN 2-12 intact, sensation intact. 5/5 strength in left upper and lower extremity, 4/5 strength in right upper extremity, 4/5 in right lower extremity (exam appears to be largely unchanged from admission).  ?Skin: warm and dry ? ?Assessment/Plan: ? ?Principal Problem: ?  Acute ischemic left MCA stroke ([place]) ? ?Joseph Patrick is a 76 y.o. male with a pertinent PMH of hypertension, hyperlipidemia, well-controlled diabetes, and CKD stage 3a admitted for acute CVA of the left MCA.  ? ?Acute CVA of left MCA ?Hx of left frontoparietal infarct ?Concern for Plavix nonresponder given recurrent CVA despite on DAPT therapy -> P2Y12 pending. Neurology signed off, recommending medical therapy with no further work up at this time. Anticipate further improvement in symptoms with PT/OT while awaiting CIR placement.  ?-Continue ASA 81 mg daily ?-Continue clopidogrel 75 mg, last day 5/14 ?   -If patient is a Plavix nonresponder, should be  switched to ticagrelor ?-Outside of permissive hypertension window, BP goal < 220/110 ?-Telemetry monitoring ?-PT/OT recommending CIR.  ?  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 Euvolemic on exam; weight down 5 kg since admission. Restarted GDMT yesterday including, sacubitril-valsartan, metoprolol tartrate, isosorbide-hydralazine, and dapagliflozin now that he is outside of permissive HTN window.  ?-Strict I's and O's ?-Daily weights ?-Daily BMP ?-Metoprolol tartrate 50 BID changed to Metoprolol Succinate 100 QD  ?  ?Severe asymptomatic hypertension, resolved  ?BP 190/90's on admission. BP 170-180/90's yesterday, with improvement today to 140/80's after starting BP meds as above. ?- Continue BP meds as above  ?  ?T2DM ?Recent Hemoglobin A1c at 7.1.  ?-Continue home dapagliflozin  ?-SSI ?  ?CKD 3a ?Cr 1.41, within baseline of 1.4-1.5. ?-BMP ?  ?Best Practice: ?Diet: Diabetic diet ?IVF: N/A ?VTE: lovenox ?Code: Full ? ?[name], MD  ?Internal Medicine Resident, PGY-1 ?Pager: [phone] ?After 5pm on weekdays and 1pm on weekends: On Call pager [phone] ?

## 2022-01-15 NOTE — Plan of Care (Signed)
?  Problem: Education: ?Goal: Knowledge of General Education information will improve ?Description: Including pain rating scale, medication(s)/side effects and non-pharmacologic comfort measures ?Outcome: Progressing ?  ?Problem: Clinical Measurements: ?Goal: Will remain free from infection ?Outcome: Progressing ?Goal: Respiratory complications will improve ?Outcome: Progressing ?Goal: Cardiovascular complication will be avoided ?Outcome: Progressing ?  ?Problem: Nutrition: ?Goal: Adequate nutrition will be maintained ?Outcome: Progressing ?  ?Problem: Coping: ?Goal: Level of anxiety will decrease ?Outcome: Progressing ?  ?Problem: Elimination: ?Goal: Will not experience complications related to bowel motility ?Outcome: Progressing ?Goal: Will not experience complications related to urinary retention ?Outcome: Progressing ?  ?Problem: Pain Managment: ?Goal: General experience of comfort will improve ?Outcome: Progressing ?  ?Problem: Safety: ?Goal: Ability to remain free from injury will improve ?Outcome: Progressing ?  ?Problem: Skin Integrity: ?Goal: Risk for impaired skin integrity will decrease ?Outcome: Progressing ?  ?Problem: Education: ?Goal: Knowledge of disease or condition will improve ?Outcome: Progressing ?  ?

## 2022-01-15 NOTE — Evaluation (Signed)
Physical Therapy Evaluation ?Patient Details ?Name: Joseph Patrick ?MRN: [id number] ?DOB: 1946/06/06 ?Today's Date: 01/15/2022 ? ?History of Present Illness ? pt is a 76 y/o male admitted with progressing R arm and leg numbness/weakness.  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  MRI now shows new infarcts in the left corona radiata, caudate body and posterior lentiform nucleus.  PMHx: CVA, DM, HTN  ?Clinical Impression ? Pt admitted with/for stroke with R sided weakness/numbness.  Pt needing min to mod assist for OOB mobility.  Pt currently limited functionally due to the problems listed. ( See problems list.)   Pt will benefit from PT to maximize function and safety in order to get ready for next venue listed below. ?   ?   ? ?Recommendations for follow up therapy are one component of a multi-disciplinary discharge planning process, led by the attending physician.  Recommendations may be updated based on patient status, additional functional criteria and insurance authorization. ? ?Follow Up Recommendations Acute inpatient rehab (3hours/day) ? ?  ?Assistance Recommended at Discharge Intermittent Supervision/Assistance  ?Patient can return home with the following ? A little help with walking and/or transfers;A lot of help with bathing/dressing/bathroom;Assistance with cooking/housework;Assist for transportation;Help with stairs or ramp for entrance ? ?  ?Equipment Recommendations Other (comment) (TBD)  ?Recommendations for Other Services ? Rehab consult  ?  ?Functional Status Assessment Patient has had a recent decline in their functional status and demonstrates the ability to make significant improvements in function in a reasonable and predictable amount of time.  ? ?  ?Precautions / Restrictions Precautions ?Precautions: Fall  ? ?  ? ?Mobility ? Bed Mobility ?Overal bed mobility: Needs Assistance ?Bed Mobility: Rolling, Sidelying to Sit, Sit to  Supine ?Rolling: Min guard ?Sidelying to sit: Min guard ?  ?Sit to supine: Min assist ?  ?General bed mobility comments: assist getting R LE into bed.  Discussed normal movement and leading with weak side as able to coordinate the movement instead of R side trailing behind uncoordinatedly. ?  ? ?Transfers ?Overall transfer level: Needs assistance ?  ?Transfers: Sit to/from Stand ?Sit to Stand: Min assist ?  ?  ?  ?  ?  ?General transfer comment: cues for safety/hand placement.  stability assist ?  ? ?Ambulation/Gait ?Ambulation/Gait assistance: Mod assist ?Gait Distance (Feet): 30 Feet ?Assistive device: Rolling walker (2 wheels) ?Gait Pattern/deviations: Step-to pattern, Step-through pattern ?  ?Gait velocity interpretation: <1.31 ft/sec, indicative of household ambulator ?  ?General Gait Details: paretic gait on the R with weak toe off, uncoordinated, poor clearance with swing through, flat contact, uncontrolled R knee hyperextension.  Overall truncal stability assist, assist of hand on the RW. ? ?Stairs ?  ?  ?  ?  ?  ? ?Wheelchair Mobility ?  ? ?Modified Rankin (Stroke Patients Only) ?Modified Rankin (Stroke Patients Only) ?Pre-Morbid Rankin Score: No significant disability ?Modified Rankin: Moderately severe disability ? ?  ? ?Balance Overall balance assessment: Needs assistance ?Sitting-balance support: No upper extremity supported ?Sitting balance-Leahy Scale: Good ?  ?  ?Standing balance support: Single extremity supported, Bilateral upper extremity supported, During functional activity ?Standing balance-Leahy Scale: Poor ?Standing balance comment: reliant on UE assist and or external support ?  ?  ?  ?  ?  ?  ?  ?  ?  ?  ?  ?   ? ? ? ?Pertinent Vitals/Pain Pain Assessment ?Pain Assessment: No/denies pain  ? ? ?Home Living  Family/patient expects to be discharged to:: Private residence ?Living Arrangements: Alone ?Available Help at Discharge: Available PRN/intermittently ?Type of Home: Apartment ?Home Access:  Elevator ?  ?  ?  ?Home Layout: One level ?Home Equipment: None ?   ?  ?Prior Function Prior Level of Function : Independent/Modified Independent;Driving ?  ?  ?  ?  ?  ?  ?  ?  ?  ? ? ?Hand Dominance  ? Dominant Hand: Right ? ?  ?Extremity/Trunk Assessment  ? Upper Extremity Assessment ?Upper Extremity Assessment: Defer to OT evaluation ?  ? ?Lower Extremity Assessment ?Lower Extremity Assessment: RLE deficits/detail;LLE deficits/detail ?RLE Deficits / Details: quads/gross extensors 3/5, hams 3-, df 1/5, pf 2/5, hip flexors 2/5, movement in synergy, numbness from shin down ?RLE Sensation: decreased light touch ?RLE Coordination: decreased fine motor ?LLE Deficits / Details: WFL ?  ? ?Cervical / Trunk Assessment ?Cervical / Trunk Assessment: Normal  ?Communication  ? Communication: No difficulties  ?Cognition Arousal/Alertness: Awake/alert ?Behavior During Therapy: [facility] for tasks assessed/performed ?Overall Cognitive Status: No family/caregiver present to determine baseline cognitive functioning (Oriented x 4, NT formally otherwise) ?  ?  ?  ?  ?  ?  ?  ?  ?  ?  ?  ?  ?  ?  ?  ?  ?  ?  ?  ? ?  ?General Comments General comments (skin integrity, edema, etc.): Reinforced importance of knowing risk factors and understanding  BE FAST ? ?  ?Exercises    ? ?Assessment/Plan  ?  ?PT Assessment Patient needs continued PT services  ?PT Problem List Decreased strength;Decreased balance;Decreased mobility;Decreased coordination;Decreased knowledge of use of DME;Decreased knowledge of precautions;Impaired sensation ? ?   ?  ?PT Treatment Interventions Gait training;DME instruction;Functional mobility training;Therapeutic activities;Balance training;Neuromuscular re-education;Patient/family education   ? ?PT Goals (Current goals can be found in the Care Plan section)  ?Acute Rehab PT Goals ?Patient Stated Goal: Get back to [facility] ?PT Goal Formulation: With patient ?Time For Goal Achievement: 01/29/22 ?Potential to Achieve  Goals: Good ? ?  ?Frequency Min 4X/week ?  ? ? ?Co-evaluation   ?  ?  ?  ?  ? ? ?  ?AM-PAC PT "6 Clicks" Mobility  ?Outcome Measure Help needed turning from your back to your side while in a flat bed without using bedrails?: A Little ?Help needed moving from lying on your back to sitting on the side of a flat bed without using bedrails?: A Little ?Help needed moving to and from a bed to a chair (including a wheelchair)?: A Lot ?Help needed standing up from a chair using your arms (e.g., wheelchair or bedside chair)?: A Little ?Help needed to walk in hospital room?: A Lot ?Help needed climbing 3-5 steps with a railing? : A Lot ?6 Click Score: 15 ? ?  ?End of Session   ?Activity Tolerance: Patient tolerated treatment well ?Patient left: in bed;with call bell/phone within reach ?Nurse Communication: Mobility status ?PT Visit Diagnosis: Other abnormalities of gait and mobility (R26.89);Hemiplegia and hemiparesis;Difficulty in walking, not elsewhere classified (R26.2) ?Hemiplegia - Right/Left: Right ?Hemiplegia - dominant/non-dominant: Dominant ?Hemiplegia - caused by: Cerebral infarction ?  ? ?Time: 7858-8502 ?PT Time Calculation (min) (ACUTE ONLY): 31 min ? ? ?Charges:   PT Evaluation ?$PT Eval Moderate Complexity: 1 Mod ?PT Treatments ?$Gait Training: 8-22 mins ?  ?   ? ? ?01/15/2022 ? ?Jacinto Halim., PT ?Acute Rehabilitation Services ?[phone]  (pager) ?[phone]  (office) ? ?Eliseo Gum Sherline Eberwein ?01/15/2022, 2:02 PM ? ?

## 2022-01-15 NOTE — Progress Notes (Addendum)
?  Date: 01/15/2022 ? ?Patient name: Joseph Patrick  ?Medical record number: [id number]  ?Date of birth: 07-27-1946  ? ?I have seen and evaluated Joseph Patrick and discussed their care with the Residency Team. Briefly, Mr. Simonelli is a 76 year old man who presented with worsening right leg and arm weakness and was found to have an acute stroke.  He was admitted to the hospital and neurology was consulted.  He was noted to have elevated blood pressure elevations at home, despite taking his medications as he has been asked to.  ? ?Vitals:  ? 01/15/22 1139 01/15/22 1230  ?BP: (!) 189/100 (!) 183/97  ?Pulse: 71   ?Resp: 20   ?Temp: 98 ?F (36.7 ?C)   ?SpO2: 99%   ? ?Gen: Lying in bed, no distress ?Eyes: Anicteric, EOMI ?HENT: neck is supple ?CV: RR, NR, no murmur, no pedal edema ?Pulm: Breathing comfortably on room air, no wheezing ?Abd: Soft, +BS ?Neuro: Speech is clear, CN are grossly intact.  3/5 strength in the upper and lower extremities on the right.  5/5 on the left.  No sensation deficits.  ? ?EKG with TWI III, V5 and V6.  Unchanged from previous on 01/06/22.  ? ?Assessment and Plan: ?I have seen and evaluated the patient as outlined above. I agree with the formulated Assessment and Plan as detailed in the residents' note, with the following changes:  ? ?1. Acute CVA of the left CVA ?- Aspirin, plavix ?- Check P2Y12 level - pending ?- Neurology consulted ?- Telemetry ?- Outside permissive HTN window ?- PT/OT/SLP evaluation ? ?2. Severe HTN, Chronic HFrEF ?- Would restart home medications slowly, will need to slowly normalize.  ? ?3. CKD stage 3a ?- At baseline, monitor ? ?Other issues per Dr. Serita Grit daily note.  ? ?Sid Falcon, MD ?5/1/20233:10 PM  ?

## 2022-01-15 NOTE — Progress Notes (Signed)
? ?  Inpatient Rehab Admissions Coordinator : ? ?Per therapy recommendations, patient was screened for CIR candidacy by Daniel Johndrow RN MSN.  At this time patient appears to be a potential candidate for CIR. I will place a rehab consult per protocol for full assessment. Please call me with any questions. ? ?Neel Buffone RN MSN ?Admissions Coordinator ?[phone] ?  ?

## 2022-01-16 DIAGNOSIS — I63512 Cerebral infarction due to unspecified occlusion or stenosis of left middle cerebral artery: Secondary | ICD-10-CM | POA: Diagnosis not present

## 2022-01-16 LAB — BASIC METABOLIC PANEL
Anion gap: 6 (ref 5–15)
BUN: 24 mg/dL — ABNORMAL HIGH (ref 8–23)
CO2: 25 mmol/L (ref 22–32)
Calcium: 8.6 mg/dL — ABNORMAL LOW (ref 8.9–10.3)
Chloride: 105 mmol/L (ref 98–111)
Creatinine, Ser: 1.41 mg/dL — ABNORMAL HIGH (ref 0.61–1.24)
GFR, Estimated: 52 mL/min — ABNORMAL LOW (ref >60)
Glucose, Bld: 129 mg/dL — ABNORMAL HIGH (ref 70–99)
Potassium: 4.1 mmol/L (ref 3.5–5.1)
Sodium: 136 mmol/L (ref 135–145)

## 2022-01-16 LAB — PLATELET INHIBITION P2Y12

## 2022-01-16 LAB — GLUCOSE, CAPILLARY
Glucose-Capillary: 132 mg/dL — ABNORMAL HIGH (ref 70–99)
Glucose-Capillary: 232 mg/dL — ABNORMAL HIGH (ref 70–99)
Glucose-Capillary: 149 mg/dL — ABNORMAL HIGH (ref 70–99)
Glucose-Capillary: 121 mg/dL — ABNORMAL HIGH (ref 70–99)
Glucose-Capillary: 143 mg/dL — ABNORMAL HIGH (ref 70–99)

## 2022-01-16 MED ORDER — METOPROLOL SUCCINATE ER 100 MG PO TB24
100.0000 mg | ORAL_TABLET | Freq: Every day | ORAL | Status: DC
  Administered 2022-01-17 – 2022-01-22 (×6): 100 mg via ORAL
  Filled 2022-01-16 (×6): qty 1

## 2022-01-16 NOTE — Progress Notes (Addendum)
? ?  Subjective: ?Overnight events: none  ? ?Patient was seen at bedside during rounds today. Pt is preparing to work with PT. He states that he has noticed improvement on his right side of his body. He does complain of generalized fatigue. Overall, he appears to be excited about the progress he has made, and appears to be motivated to continue to make progress.  ? ?Pt is updated on the plan for today, and all questions and concerns are addressed.  ? ?Objective: ? ?Vital signs in last 24 hours: ?Vitals:  ? 01/16/22 2313 01/17/22 0341 01/17/22 0809 01/17/22 0944  ?BP: (!) 174/83 (!) 151/113 (!) 161/74   ?Pulse: 64 70 (!) 56 64  ?Resp: 20 17    ?Temp: 98.2 ?F (36.8 ?C) 97.8 ?F (36.6 ?C) 97.6 ?F (36.4 ?C)   ?TempSrc: Oral Oral Oral   ?SpO2: 98% 100% 95%   ?Weight:      ?Height:      ? ?Constitutional: alert, well appearing, and in no acute distress  ?HENT: normocephalic, mucous membranes moist ?Cardiovascular: RRR, 2/6 systolic murmur appreciated in left sternal border ?Pulmonary/Chest: normal work of breathing on room air, LCTAB ?MSK: no edema present in lower extremities bilaterally ?Neurological: alert & oriented x 3, sensation intact. 5/5 strength in left upper and lower extremity, 4/5 strength in right upper extremity, 4/5 in right lower extremity (exam appears slightly improved from admission).  ?Skin: warm and dry ? ?Assessment/Plan: ? ?Principal Problem: ?  Acute ischemic left MCA stroke ([place]) ? ?Joseph Patrick is a 76 y.o. male with a pertinent PMH of hypertension, hyperlipidemia, well-controlled diabetes, and CKD stage 3a admitted for acute CVA of the left MCA.  ? ?Acute CVA of left MCA ?Hx of left frontoparietal infarct ?Concern for Plavix nonresponder given recurrent CVA despite on DAPT therapy -> P2Y12 test resulted below the reference range indicating Plavix is working as expected. Neurology signed off, recommending medical therapy with no further work up at this time. Anticipate further improvement in  symptoms with PT/OT. Approved by CIR. Medically ready for CIR tranfer, pending insurance authorization.  ?-Continue ASA 81 mg daily ?-Continue clopidogrel 75 mg, last day 5/14  ?-Outside of permissive hypertension window ?-Telemetry monitoring ?-PT/OT recommending CIR.  ?  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 Euvolemic on exam. On his home GDMT including, sacubitril-valsartan BID, metoprolol succinate 100 mg QD, isosorbide-hydralazine TID, and dapagliflozin now that he is outside of permissive HTN window.  ?-Strict I's and O's ?-Daily weights ?-Daily BMP ?  ?Severe asymptomatic hypertension, resolved  ?Hx of HTN  ?BP 190/90's on admission. BP improved to 140/80's yesterday after starting  back above medications, however is hypertensive today 160/70's. Will continue to monitor his pressures, and adjust regimen if remains elevated.  ?- Continue BP meds as above  ?  ?T2DM ?Recent Hemoglobin A1c at 7.1. CGM's <180  ?-SSI ?-Continue home dapagliflozin  ?  ?CKD 3a ?Cr 1.3. within baseline of 1.4-1.5 for past several days. Will hold off on daily BMP's at this time.  ? ? ?Best Practice: ?Diet: Diabetic diet ?IVF: N/A ?VTE: lovenox ?Code: Full ? ?[name], MD  ?Internal Medicine Resident, PGY-1 ?Pager: [phone] ?After 5pm on weekdays and 1pm on weekends: On Call pager [phone] ?

## 2022-01-16 NOTE — Plan of Care (Signed)
?  Problem: Education: ?Goal: Knowledge of General Education information will improve ?Description: Including pain rating scale, medication(s)/side effects and non-pharmacologic comfort measures ?Outcome: Progressing ?  ?Problem: Clinical Measurements: ?Goal: Will remain free from infection ?Outcome: Progressing ?Goal: Respiratory complications will improve ?Outcome: Progressing ?Goal: Cardiovascular complication will be avoided ?Outcome: Progressing ?  ?Problem: Nutrition: ?Goal: Adequate nutrition will be maintained ?Outcome: Progressing ?  ?Problem: Coping: ?Goal: Level of anxiety will decrease ?Outcome: Progressing ?  ?Problem: Elimination: ?Goal: Will not experience complications related to bowel motility ?Outcome: Progressing ?Goal: Will not experience complications related to urinary retention ?Outcome: Progressing ?  ?Problem: Pain Managment: ?Goal: General experience of comfort will improve ?Outcome: Progressing ?  ?Problem: Safety: ?Goal: Ability to remain free from injury will improve ?Outcome: Progressing ?  ?Problem: Skin Integrity: ?Goal: Risk for impaired skin integrity will decrease ?Outcome: Progressing ?  ?Problem: Education: ?Goal: Knowledge of disease or condition will improve ?Outcome: Progressing ?Goal: Knowledge of secondary prevention will improve (SELECT ALL) ?Outcome: Progressing ?Goal: Knowledge of patient specific risk factors will improve (INDIVIDUALIZE FOR PATIENT) ?Outcome: Progressing ?  ?

## 2022-01-16 NOTE — Progress Notes (Signed)
Physical Therapy Treatment ?Patient Details ?Name: Joseph Patrick ?MRN: [id number] ?DOB: 10-Feb-1946 ?Today's Date: 01/16/2022 ? ? ?History of Present Illness pt is a 76 y/o male admitted with progressing R arm and leg numbness/weakness.  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  MRI now shows new infarcts in the left corona radiata, caudate body and posterior lentiform nucleus.  PMHx: CVA, DM, HTN ? ?  ?PT Comments  ? ? Pt showing less control of his R LE today.  Emphasis on normalized transition to EOB, safe transfers sit to stand to sit, pre gait activity to work on w/shift, un-weighting, toe off and general safety before gait, then progressed to short distance gait with heavier level of mod assist needed with swing through assist of R LE needed today. ?  ?Recommendations for follow up therapy are one component of a multi-disciplinary discharge planning process, led by the attending physician.  Recommendations may be updated based on patient status, additional functional criteria and insurance authorization. ? ?Follow Up Recommendations ? Acute inpatient rehab (3hours/day) ?  ?  ?Assistance Recommended at Discharge Intermittent Supervision/Assistance  ?Patient can return home with the following A lot of help with bathing/dressing/bathroom;Assistance with cooking/housework;Assist for transportation;Help with stairs or ramp for entrance;A lot of help with walking and/or transfers ?  ?Equipment Recommendations ? Other (comment) (TBD next venue)  ?  ?Recommendations for Other Services Rehab consult ? ? ?  ?Precautions / Restrictions Precautions ?Precautions: Fall  ?  ? ?Mobility ? Bed Mobility ?Overal bed mobility: Needs Assistance ?Bed Mobility: Supine to Sit, Sit to Supine ?  ?  ?Supine to sit: Min guard ?Sit to supine: Min assist ?  ?General bed mobility comments: pt more inattentive to the right side, the right side is trailing the movement from right to  left instead of leading. ?  ? ?Transfers ?Overall transfer level: Needs assistance ?Equipment used: Rolling walker (2 wheels) ?Transfers: Sit to/from Stand ?Sit to Stand: Min assist ?  ?  ?  ?  ?  ?General transfer comment: cues for safety/hand placement.  stability assist ?  ? ?Ambulation/Gait ?Ambulation/Gait assistance: Mod assist ?Gait Distance (Feet): 30 Feet ?Assistive device: Rolling walker (2 wheels) ?Gait Pattern/deviations: Step-to pattern, Step-through pattern ?  ?Gait velocity interpretation: <1.31 ft/sec, indicative of household ambulator ?  ?General Gait Details: Worsened paretic gait on the R with less knee control, worse toe off and need for assist to swing R LE through, guard of R knee for high potential to buckle. ? ? ?Stairs ?  ?  ?  ?  ?  ? ? ?Wheelchair Mobility ?  ? ?Modified Rankin (Stroke Patients Only) ?Modified Rankin (Stroke Patients Only) ?Pre-Morbid Rankin Score: No significant disability ?Modified Rankin: Moderately severe disability ? ? ?  ?Balance Overall balance assessment: Needs assistance ?Sitting-balance support: No upper extremity supported ?Sitting balance-Leahy Scale: Good ?  ?  ?  ?Standing balance-Leahy Scale: Poor ?Standing balance comment: reliant on UE assist and external support ?  ?  ?  ?  ?  ?  ?  ?  ?  ?  ?  ?  ? ?  ?Cognition Arousal/Alertness: Awake/alert ?Behavior During Therapy: [facility] for tasks assessed/performed ?Overall Cognitive Status: No family/caregiver present to determine baseline cognitive functioning ?  ?  ?  ?  ?  ?  ?  ?  ?  ?  ?  ?  ?  ?  ?  ?  ?  ?  ?  ? ?  ?  Exercises   ? ?  ?General Comments   ?  ?  ? ?Pertinent Vitals/Pain Pain Assessment ?Pain Assessment: No/denies pain  ? ? ?Home Living   ?  ?  ?  ?  ?  ?  ?  ?  ?  ?   ?  ?Prior Function    ?  ?  ?   ? ?PT Goals (current goals can now be found in the care plan section) Acute Rehab PT Goals ?Patient Stated Goal: Get back to [place] ?PT Goal Formulation: With patient ?Time For Goal Achievement:  01/29/22 ?Potential to Achieve Goals: Good ?Progress towards PT goals: Progressing toward goals ? ?  ?Frequency ? ? ? Min 4X/week ? ? ? ?  ?PT Plan Current plan remains appropriate  ? ? ?Co-evaluation   ?  ?  ?  ?  ? ?  ?AM-PAC PT "6 Clicks" Mobility   ?Outcome Measure ? Help needed turning from your back to your side while in a flat bed without using bedrails?: A Little ?Help needed moving from lying on your back to sitting on the side of a flat bed without using bedrails?: A Little ?Help needed moving to and from a bed to a chair (including a wheelchair)?: A Lot ?Help needed standing up from a chair using your arms (e.g., wheelchair or bedside chair)?: A Little ?Help needed to walk in hospital room?: A Lot ?Help needed climbing 3-5 steps with a railing? : A Lot ?6 Click Score: 15 ? ?  ?End of Session Equipment Utilized During Treatment: Gait belt ?Activity Tolerance: Patient tolerated treatment well ?Patient left: in bed;with call bell/phone within reach ?Nurse Communication: Mobility status;Precautions ?PT Visit Diagnosis: Other abnormalities of gait and mobility (R26.89);Hemiplegia and hemiparesis;Difficulty in walking, not elsewhere classified (R26.2) ?Hemiplegia - Right/Left: Right ?Hemiplegia - dominant/non-dominant: Dominant ?Hemiplegia - caused by: Cerebral infarction ?  ? ? ?Time: [id number] ?PT Time Calculation (min) (ACUTE ONLY): 19 min ? ?Charges:  $Gait Training: 8-22 mins          ?          ? ?01/16/2022 ? ?Ginger Carne., PT ?Acute Rehabilitation Services ?[phone]  (pager) ?[phone]  (office) ? ? ?Tessie Fass Anaelle Dunton ?01/16/2022, 5:36 PM ? ?

## 2022-01-16 NOTE — Progress Notes (Signed)
Inpatient Rehab Admissions Coordinator:  ? ?Met with patient at bedside to discuss CIR recommendations and goals of CIR stay.  Reviewed 3 hrs/day of therapy with average length of stay ~2 weeks (depending on progress), and goals of supervision to mod I.  Pt states he feels with rehab ("as long as I can walk") he'll be fine at home.  Seems to be fairly cognitively intact, though likely with some deficits in anticipatory awareness.  He is agreeable to CIR.  I returned later with family at the bedside and reviewed above, family and pt confirm that his sister can provide supervision if needed at discharge.  I will start insurance authorization request and follow.  ? ?Shann Medal, PT, DPT ?Admissions Coordinator ?[phone] ?01/16/22  ?4:52 PM ? ?

## 2022-01-17 DIAGNOSIS — I63512 Cerebral infarction due to unspecified occlusion or stenosis of left middle cerebral artery: Secondary | ICD-10-CM | POA: Diagnosis not present

## 2022-01-17 LAB — BASIC METABOLIC PANEL
Anion gap: 6 (ref 5–15)
BUN: 24 mg/dL — ABNORMAL HIGH (ref 8–23)
CO2: 27 mmol/L (ref 22–32)
Calcium: 8.8 mg/dL — ABNORMAL LOW (ref 8.9–10.3)
Chloride: 105 mmol/L (ref 98–111)
Creatinine, Ser: 1.31 mg/dL — ABNORMAL HIGH (ref 0.61–1.24)
GFR, Estimated: 56 mL/min — ABNORMAL LOW (ref >60)
Glucose, Bld: 123 mg/dL — ABNORMAL HIGH (ref 70–99)
Potassium: 3.7 mmol/L (ref 3.5–5.1)
Sodium: 138 mmol/L (ref 135–145)

## 2022-01-17 LAB — GLUCOSE, CAPILLARY
Glucose-Capillary: 147 mg/dL — ABNORMAL HIGH (ref 70–99)
Glucose-Capillary: 149 mg/dL — ABNORMAL HIGH (ref 70–99)
Glucose-Capillary: 137 mg/dL — ABNORMAL HIGH (ref 70–99)
Glucose-Capillary: 125 mg/dL — ABNORMAL HIGH (ref 70–99)

## 2022-01-17 NOTE — Progress Notes (Signed)
Occupational Therapy Treatment ?Patient Details ?Name: Joseph Patrick ?MRN: [id number] ?DOB: [birth year] ?Today's Date: 01/17/2022 ? ? ?History of present illness pt is a 76 y/o male admitted with progressing R arm and leg numbness/weakness.  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  MRI now shows new infarcts in the left corona radiata, caudate body and posterior lentiform nucleus.  PMHx: CVA, DM, HTN ?  ?OT comments ? Pt progressing towards goals, states he is feeling dizzy/tired/weak this session, attributes this to medication however does not limit his participation. Pt able to walk in-room distance with min A to keep RUE supported on RW. Educated pt on positioning and self ROM during session, pt verbalizing states he has been keeping RUE elevated on pillow. Facilitated weightbearing through RUE, pt able to push trunk from R lateral lean to midline with use of R hand and forearm on bed. Pt presenting with impairments listed below, will follow acutely. Continue to recommend AIR at d/c.  ? ?Recommendations for follow up therapy are one component of a multi-disciplinary discharge planning process, led by the attending physician.  Recommendations may be updated based on patient status, additional functional criteria and insurance authorization. ?   ?Follow Up Recommendations ? Acute inpatient rehab (3hours/day)  ?  ?Assistance Recommended at Discharge Intermittent Supervision/Assistance  ?Patient can return home with the following ? Two people to help with walking and/or transfers;A lot of help with bathing/dressing/bathroom;Assistance with cooking/housework;Direct supervision/assist for medications management;Direct supervision/assist for financial management;Help with stairs or ramp for entrance;Assist for transportation ?  ?Equipment Recommendations ? None recommended by OT (defer to next venue of care)  ?  ?Recommendations for Other Services Rehab  consult ? ?  ?Precautions / Restrictions Precautions ?Precautions: Fall ?Restrictions ?Weight Bearing Restrictions: No  ? ? ?  ? ?Mobility Bed Mobility ?Overal bed mobility: Needs Assistance ?Bed Mobility: Sit to Sidelying ?  ?  ?  ?  ?Sit to sidelying: Supervision ?General bed mobility comments: cues to keep R hand supported with L hand while moving sit > supine in bed ?  ? ?Transfers ?Overall transfer level: Needs assistance ?Equipment used: Rolling walker (2 wheels) ?Transfers: Sit to/from Stand ?Sit to Stand: Min assist ?  ?  ?  ?  ?  ?General transfer comment: assist to keep R hand on RW for weightbearing ?  ?  ?Balance Overall balance assessment: Needs assistance ?Sitting-balance support: No upper extremity supported, Feet supported ?Sitting balance-Leahy Scale: Good ?Sitting balance - Comments: can reach outside BOS without LOB ?  ?Standing balance support: Bilateral upper extremity supported, During functional activity, Reliant on assistive device for balance ?Standing balance-Leahy Scale: Poor ?  ?  ?  ?  ?  ?  ?  ?  ?  ?  ?  ?  ?   ? ?ADL either performed or assessed with clinical judgement  ? ?ADL Overall ADL's : Needs assistance/impaired ?  ?  ?  ?  ?  ?  ?  ?  ?  ?  ?  ?  ?Toilet Transfer: Rolling walker (2 wheels);Ambulation;Regular Toilet;Minimal assistance ?Toilet Transfer Details (indicate cue type and reason): simulated in room ?  ?  ?  ?  ?Functional mobility during ADLs: Minimal assistance;Rolling walker (2 wheels) ?  ?  ? ?Extremity/Trunk Assessment Upper Extremity Assessment ?Upper Extremity Assessment: RUE deficits/detail ?RUE Deficits / Details: R shoulder hiking with flexion, decreased grasp strength and ability to extend fingers in R hand. ?  RUE Coordination: decreased fine motor;decreased gross motor ?  ?Lower Extremity Assessment ?Lower Extremity Assessment: Defer to PT evaluation ?  ?  ?  ? ?Vision   ?Vision Assessment?: No apparent visual deficits ?Additional Comments: will further  assess, pt reporting no visual deficits ?  ?Perception Perception ?Perception: Not tested ?  ?Praxis Praxis ?Praxis: Not tested ?  ? ?Cognition Arousal/Alertness: Awake/alert ?Behavior During Therapy: [facility] for tasks assessed/performed ?Overall Cognitive Status: No family/caregiver present to determine baseline cognitive functioning ?  ?  ?  ?  ?  ?  ?  ?  ?  ?  ?  ?  ?  ?  ?  ?  ?General Comments: motivated, tangential and requires redirection at times ?  ?  ?   ?Exercises Exercises: General Upper Extremity ?General Exercises - Upper Extremity ?Elbow Flexion: Self ROM, Right, 5 reps, Seated ?Elbow Extension: Self ROM, Right, 5 reps, Seated ?Digit Composite Flexion: Self ROM, Right, 5 reps, Seated ?Composite Extension: Self ROM, Right, 5 reps, Seated ? ?  ?Shoulder Instructions   ? ? ?  ?General Comments Educated pt on supportive/protective positioning of RUE during activity/transfers, pt able to perform self ROM of RUE during session  ? ? ?Pertinent Vitals/ Pain       Pain Assessment ?Pain Assessment: No/denies pain ? ?Home Living   ?  ?  ?  ?  ?  ?  ?  ?  ?  ?  ?  ?  ?  ?  ?  ?  ?  ?  ? ?  ?Prior Functioning/Environment    ?  ?  ?  ?   ? ?Frequency ? Min 3X/week  ? ? ? ? ?  ?Progress Toward Goals ? ?OT Goals(current goals can now be found in the care plan section) ? Progress towards OT goals: Progressing toward goals ? ?Acute Rehab OT Goals ?Patient Stated Goal: to go to rehab ?OT Goal Formulation: With patient ?Time For Goal Achievement: 01/29/22 ?Potential to Achieve Goals: Good ?ADL Goals ?Pt Will Perform Upper Body Dressing: with min assist;sitting;standing ?Pt Will Perform Lower Body Dressing: with min assist;sitting/lateral leans;sit to/from stand ?Pt Will Transfer to Toilet: with min assist;bedside commode;stand pivot transfer ?Pt Will Perform Toileting - Clothing Manipulation and hygiene: with min guard assist;sitting/lateral leans;sit to/from stand  ?Plan Discharge plan remains appropriate;Frequency remains  appropriate   ? ?Co-evaluation ? ? ?   ?  ?  ?  ?  ? ?  ?AM-PAC OT "6 Clicks" Daily Activity     ?Outcome Measure ? ? Help from another person eating meals?: None ?Help from another person taking care of personal grooming?: A Little ?Help from another person toileting, which includes using toliet, bedpan, or urinal?: A Lot ?Help from another person bathing (including washing, rinsing, drying)?: A Lot ?Help from another person to put on and taking off regular upper body clothing?: A Lot ?Help from another person to put on and taking off regular lower body clothing?: A Lot ?6 Click Score: 15 ? ?  ?End of Session Equipment Utilized During Treatment: Gait belt;Rolling walker (2 wheels) ? ?OT Visit Diagnosis: Unsteadiness on feet (R26.81);Muscle weakness (generalized) (M62.81);Other abnormalities of gait and mobility (R26.89);Other symptoms and signs involving the nervous system (R29.898);Hemiplegia and hemiparesis ?  ?Activity Tolerance Patient tolerated treatment well ?  ?Patient Left in bed;with call bell/phone within reach;with bed alarm set ?  ?Nurse Communication Mobility status ?  ? ?   ? ?Time: 1030-1314 ?OT Time Calculation (min):  33 min ? ?Charges: OT General Charges ?$OT Visit: 1 Visit ?OT Treatments ?$Therapeutic Activity: 23-37 mins ? ?Alfonzo BeersNatalie Kerrianne Jeng, OTD, OTR/L ?Acute Rehab ?[phone] ? ? ?Mayer MaskerNatalie M Aubri Gathright ?01/17/2022, 4:26 PM ?

## 2022-01-17 NOTE — Plan of Care (Signed)
?  Problem: Education: ?Goal: Knowledge of General Education information will improve ?Description: Including pain rating scale, medication(s)/side effects and non-pharmacologic comfort measures ?Outcome: Progressing ?  ?Problem: Clinical Measurements: ?Goal: Will remain free from infection ?Outcome: Progressing ?Goal: Respiratory complications will improve ?Outcome: Progressing ?Goal: Cardiovascular complication will be avoided ?Outcome: Progressing ?  ?Problem: Nutrition: ?Goal: Adequate nutrition will be maintained ?Outcome: Progressing ?  ?Problem: Coping: ?Goal: Level of anxiety will decrease ?Outcome: Progressing ?  ?Problem: Elimination: ?Goal: Will not experience complications related to bowel motility ?Outcome: Progressing ?Goal: Will not experience complications related to urinary retention ?Outcome: Progressing ?  ?Problem: Pain Managment: ?Goal: General experience of comfort will improve ?Outcome: Progressing ?  ?Problem: Safety: ?Goal: Ability to remain free from injury will improve ?Outcome: Progressing ?  ?Problem: Skin Integrity: ?Goal: Risk for impaired skin integrity will decrease ?Outcome: Progressing ?  ?Problem: Education: ?Goal: Knowledge of disease or condition will improve ?Outcome: Progressing ?  ?

## 2022-01-17 NOTE — Progress Notes (Signed)
Inpatient Rehab Admissions Coordinator:  ? ?Awaiting determination from [facility] regarding CIR prior auth request.  Will continue to follow.  ? ?Shann Medal, PT, DPT ?Admissions Coordinator ?[phone] ?01/17/22  ?12:10 PM ? ?

## 2022-01-17 NOTE — PMR Pre-admission (Signed)
PMR Admission Coordinator Pre-Admission Assessment ? ?Patient: Joseph Patrick is an 76 y.o., male ?MRN: [id number] ?DOB: Aug 26, 1946 ?Height: $RemoveBefor'5\' 8"'rJmOgWxyXKNk$  (172.7 cm) ?Weight: 76.5 kg ? ?Insurance Information ?HMO: yes    PPO:      PCP:      IPA:      80/20:      OTHER:  ?PRIMARY: [employer]      Policy#: [id number]      Subscriber: pt ?CM Name: [place]      Phone#: [phone] (expedited appeals dept)    Fax#: [phone] ?Pre-Cert#: [id number] Josem Kaufmann for CIR from [place] in the expedited appeals dept with updates due to fax listed above on 5/13.        Employer:  ?Benefits:  Phone #: [phone]     Name:  ?Eff. Date: 09/17/21     Deduct: $0      Out of Pocket Max: $3400 ($0 met)      Life Max: na/ ?CIR: $295/day for days 1-6      SNF: 20 full days ?Outpatient:      Co-Pay: $10-$20/visit ?Home Health: 100%      Co-Pay:  ?DME: 80%     Co-ins: 20% ?Providers:  ?SECONDARY:       Policy#:      Phone#:  ? ?Financial Counselor:       Phone#:  ? ?The ?Data Collection Information Summary? for patients in Inpatient Rehabilitation Facilities with attached ?Privacy Act [place] Records? was provided and verbally reviewed with: Patient and Family ? ?Emergency Contact Information ?Contact Information   ? ? Name Relation Home Work Mobile  ? Knotts-Mclean,Angela Niece   [phone]  ? ?  ? ? ?Current Medical History  ?Patient Admitting Diagnosis: L MCA CVA ? 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  Presented 01/14/2022 with increasing right-sided weakness as well as stuttering speech.   CT/MRI showed acute infarct involving the left corona radiata, caudate body, and posterior lentiform nucleus.  Recent echocardiogram with ejection fraction of 25 to 30% left ventricle demonstrated global hypokinesis grade 2 diastolic dysfunction.  Admission chemistries unremarkable aside glucose 136 creatinine 1.43, urine drug screen negative.  Neurology follow-up patient currently remains on low-dose aspirin and Plavix.  Lovenox for DVT prophylaxis.  Tolerating a regular consistency diet.  Therapy evaluations completed due to patient decreased functional mobility right side weakness was recommended for a comprehensive rehab program. ? ?Complete NIHSS TOTAL: 8 ? ?Patient's medical record from [facility] has been reviewed by the rehabilitation admission coordinator and physician. ? ?Past Medical History  ?Past Medical History:  ?Diagnosis Date  ? CVA (cerebral vascular accident) [facility])   ? april 2023  ? Diabetes mellitus without complication ([place])   ? Hypertension   ? ? ?Has the patient had major surgery during 100 days prior to admission? No ? ?Family History   ?family history includes Heart failure in his sister. ? ?Current Medications ? ?Current Facility-Administered Medications:  ?  acetaminophen (TYLENOL) tablet 650 mg, 650 mg, Oral, Q4H PRN **OR** acetaminophen (TYLENOL) 160 MG/5ML solution 650 mg, 650 mg, Per Tube, Q4H PRN **OR** acetaminophen (TYLENOL) suppository 650 mg, 650 mg, Rectal,  Q4H PRN, Lacinda Axon, MD ?  aspirin EC tablet 81 mg, 81 mg, Oral, Daily, Masters, Katie, DO, 81 mg at 01/16/22 0956 ?  atorvastatin (LIPITOR) tablet 80 mg, 80 mg, Oral, Daily, Masters, Katie, DO, 80 mg at 01/16/22 0956 ?  clopidogrel (PLAVIX) tablet 75 mg, 75 mg, Oral, Daily, [name], MD, 75 mg at 01/16/22 0956 ?  dapagliflozin propanediol (FARXIGA) tablet 5 mg, 5 mg, Oral, QAC breakfast, Lacinda Axon, MD, 5 mg at 01/16/22 0757 ?  enoxaparin (LOVENOX) injection 40 mg, 40 mg, Subcutaneous, Q24H, Lacinda Axon, MD, 40 mg at 01/16/22 1545 ?  insulin aspart (novoLOG) injection 0-15 Units, 0-15 Units, Subcutaneous, TID WC, Masters, Katie, DO, 2 Units at 01/16/22 1733 ?  isosorbide-hydrALAZINE (BIDIL) 20-37.5 MG per tablet 1 tablet, 1 tablet, Oral, TID, [name], MD, 1 tablet at 01/16/22 2108 ?  metoprolol succinate (TOPROL-XL) 24 hr tablet 100 mg, 100 mg, Oral, Daily, [name], MD ?  sacubitril-valsartan (ENTRESTO) 24-26 mg per tablet, 1 tablet, Oral, BID, Lacinda Axon, MD, 1 tablet at 01/16/22 2109 ?  senna-docusate (Senokot-S) tablet 1 tablet, 1 tablet, Oral, QHS PRN, Lacinda Axon, MD ? ?Patients Current Diet:  ?Diet Order   ? ?       ?  Diet Carb Modified Fluid consistency: Thin; Room service appropriate? Yes  Diet effective now       ?  ? ?  ?  ? ?  ? ? ?Precautions / Restrictions ?Precautions ?Precautions: Fall ?Restrictions ?Weight Bearing Restrictions: No  ? ?Has the patient had 2 or more falls or a fall with injury in the past year? No ? ?Prior Activity Level ?Limited Community (1-2x/wk): driving some, mod I with SPC veresus RW depending on day, mod i with ADLs ? ?Prior Functional Level ?Self Care: Did the patient need help bathing, dressing, using the toilet or eating? Independent ? ?Indoor Mobility: Did the patient need assistance with walking from room to room (with or without device)? Independent ? ?Stairs: Did the patient need assistance with internal or external stairs (with or without device)? Independent ? ?Functional Cognition: Did the patient need help planning regular tasks such as shopping or remembering to take medications? Independent ? ?Patient Information ?Are you of Hispanic, Latino/a,or Spanish origin?: A. No, not of Hispanic, Latino/a, or Spanish origin ?What is your race?: B. Black or African American ?Do you need or want an interpreter to communicate with a doctor or health care staff?: 0. No ? ?Patient's Response To:  ?Health Literacy and Transportation ?Is the  patient able to respond to health literacy and transportation needs?: Yes ?Health Literacy - How often do you need to have someone help you when you read instructions, pamphlets, or other written material from your doctor or pharmacy?: Never ?In the past 12 months, has lack of transportation kept you from medical appointments or from getting medications?: No ?In the past 12 months, has lack of transportation kept you from meetings, work, or from getting things needed for daily living?: No ? ?Home Assistive Devices / Equipment ?Home Assistive Devices/Equipment: Cane (specify quad or straight) ?Home Equipment: None ? ?Prior Device Use: Indicate devices/aids used by the patient prior to current illness, exacerbation or injury? Walker and SPC ? ?Current Functional Level ?Cognition ? Overall Cognitive Status: No family/caregiver present to determine baseline cognitive functioning ?Orientation Level: Oriented X4 ?General Comments: pt with no recollection of BEFAST despite PT going over it with pt prior to OT session, will further  assess ?   ?Extremity Assessment ?(includes Sensation/Coordination) ? Upper Extremity Assessment: RUE deficits/detail ?RUE Deficits / Details: R shoulder hiking with flexion, decreased grasp strength and ability to extend fingers in R hand. ?RUE Coordination: decreased fine motor, decreased gross motor  ?Lower Extremity Assessment: Defer to PT evaluation ?RLE Deficits / Details: quads/gross extensors 3/5, hams 3-, df 1/5, pf 2/5, hip flexors 2/5, movement in synergy, numbness from shin down ?RLE Sensation: decreased light touch ?RLE Coordination: decreased fine motor ?LLE Deficits / Details: WFL  ?  ?ADLs ? Overall ADL's : Needs assistance/impaired ?Eating/Feeding: Set up, Sitting ?Grooming: Set up, Sitting ?Upper Body Bathing: Minimal assistance, Sitting ?Lower Body Bathing: Moderate assistance, Sitting/lateral leans ?Upper Body Dressing : Minimal assistance, Sitting, Cueing for compensatory  techniques ?Lower Body Dressing: Moderate assistance, Sitting/lateral leans ?Toilet Transfer: Moderate assistance, BSC/3in1, Stand-pivot, Rolling walker (2 wheels) ?Toileting- Clothing Manipulation and Hygi

## 2022-01-17 NOTE — Plan of Care (Signed)
  Problem: Education: Goal: Knowledge of disease or condition will improve Outcome: Progressing Goal: Knowledge of secondary prevention will improve (SELECT ALL) Outcome: Progressing   

## 2022-01-17 NOTE — Discharge Instructions (Addendum)
Joseph Patrick you were admitted to [facility] Internal Medicine Service because of a stroke. We did several labs and tests to rule out many life threatening things, as well as to determine the cause of your symptoms.We treated you with medications.  ? ?PLEASE continue to take your medications as provided in your discharge. Continue taking Asprin and Plavix until 01/28/22, and then continue taking aspirin alone daily.  ? ?PLEASE do not miss any doses of your medications as it is very important in ensuring you continue to feel better and remain stable.  ? ?You need to follow up with your primary care doctor by calling their office within the next week to create an appointment to discuss the events of this hospitalization, and to determine the best management plan for you and your conditions, to prevent you from having to return to the hospital. ADDITIONALLY, please go to any other appointments made for you in this discharge information. ? ?RETURN to the ED if you have similar or worsening symptoms, and do not feel like your normal self.  ? ?

## 2022-01-17 NOTE — Progress Notes (Addendum)
Physical Therapy Treatment ?Patient Details ?Name: Joseph Patrick ?MRN: [id number] ?DOB: 10/25/1945 ?Today's Date: 01/17/2022 ? ? ?History of Present Illness pt is a 76 y/o male admitted with progressing R arm and leg numbness/weakness.  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  MRI now shows new infarcts in the left corona radiata, caudate body and posterior lentiform nucleus.  PMHx: CVA, DM, HTN ? ?  ?PT Comments  ? ? Received pt semi-reclined in bed eager for therapy. Pt performed bed mobility with min A and donned pants with max A (but remembered to thread RLE through first). Pt performed transfers with RW with mod fading to min A with cues for technique and manual facilitation to keep RUE on RW. Pt is progressing well with ambulation and demonstrates improvements in gait mechanics and speed. Pt continues to remain a great candidate for AIR. Acute PT to cont to follow.  ?   ?Recommendations for follow up therapy are one component of a multi-disciplinary discharge planning process, led by the attending physician.  Recommendations may be updated based on patient status, additional functional criteria and insurance authorization. ? ?Follow Up Recommendations ? Acute inpatient rehab (3hours/day) ?  ?  ?Assistance Recommended at Discharge Frequent or constant Supervision/Assistance  ?Patient can return home with the following A lot of help with bathing/dressing/bathroom;Assistance with cooking/housework;Assist for transportation;Help with stairs or ramp for entrance;A lot of help with walking and/or transfers ?  ?Equipment Recommendations ? Other (comment) (TBD at next venue)  ?  ?Recommendations for Other Services Rehab consult ? ? ?  ?Precautions / Restrictions Precautions ?Precautions: Fall ?Restrictions ?Weight Bearing Restrictions: No  ?  ? ?Mobility ? Bed Mobility ?Overal bed mobility: Needs Assistance ?Bed Mobility: Supine to Sit, Rolling ?Rolling:  Supervision ?  ?Supine to sit: Min assist ?  ?  ?General bed mobility comments: Pt required cues for technique and R hemibody placement/positioning. Required min A to transition from supine<>sitting EOB ?Patient Response: Cooperative ? ?Transfers ?Overall transfer level: Needs assistance ?Equipment used: Rolling walker (2 wheels) ?Transfers: Sit to/from Stand, Bed to chair/wheelchair/BSC ?Sit to Stand: Min assist ?Stand pivot transfers: Min guard ?  ?  ?  ?  ?General transfer comment: cues for technique, weight shfiting, R foot placement, and required manual assist to keep RUE on RW ?  ? ?Ambulation/Gait ?Ambulation/Gait assistance: Mod assist, +2 safety/equipment (recliner follow) ?Gait Distance (Feet): 49 Feet ?Assistive device: Rolling walker (2 wheels) ?Gait Pattern/deviations: Step-to pattern, Decreased step length - right, Decreased stride length, Decreased dorsiflexion - right, Knee hyperextension - right, Narrow base of support ?Gait velocity: decreased ?Gait velocity interpretation: <1.31 ft/sec, indicative of household ambulator ?Pre-gait activities: marching and weight shifting to R ?General Gait Details: pt with no buckling, but increased R knee hyperextension, R trendelenburg hip drop, and lack of DF ? ? ?Stairs ?  ?  ?  ?  ?  ? ? ?Wheelchair Mobility ?  ? ?Modified Rankin (Stroke Patients Only) ?Modified Rankin (Stroke Patients Only) ?Pre-Morbid Rankin Score: No significant disability ?Modified Rankin: Moderately severe disability ? ? ?  ?Balance Overall balance assessment: Needs assistance ?Sitting-balance support: No upper extremity supported, Feet supported ?Sitting balance-Leahy Scale: Good ?Sitting balance - Comments: sitting EOB talking with medical team with distant supervision ?  ?Standing balance support: Bilateral upper extremity supported, During functional activity (RW) ?Standing balance-Leahy Scale: Poor ?Standing balance comment: required manual faciliation to keep RUE on RW. Able to  maintain static standing  balance with as little as min guard ?  ?  ?  ?  ?  ?  ?  ?  ?  ?  ?  ?  ? ?  ?Cognition Arousal/Alertness: Awake/alert ?Behavior During Therapy: [facility] for tasks assessed/performed ?Overall Cognitive Status: No family/caregiver present to determine baseline cognitive functioning ?  ?  ?  ?  ?  ?  ?  ?  ?  ?  ?  ?  ?  ?  ?  ?  ?General Comments: pt very motivated ?  ?  ? ?  ?Exercises   ? ?  ?General Comments General comments (skin integrity, edema, etc.): hyperverbal with numerous questions about recovery ?  ?  ? ?Pertinent Vitals/Pain Pain Assessment ?Pain Assessment: No/denies pain  ? ? ?Home Living   ?  ?  ?  ?  ?  ?  ?  ?  ?  ?   ?  ?Prior Function    ?  ?  ?   ? ?PT Goals (current goals can now be found in the care plan section) Acute Rehab PT Goals ?Patient Stated Goal: Get back to [place] ?PT Goal Formulation: With patient ?Time For Goal Achievement: 01/29/22 ?Potential to Achieve Goals: Good ? ?  ?Frequency ? ? ? Min 4X/week ? ? ? ?  ?PT Plan Current plan remains appropriate  ? ? ?Co-evaluation   ?  ?  ?  ?  ? ?  ?AM-PAC PT "6 Clicks" Mobility   ?Outcome Measure ? Help needed turning from your back to your side while in a flat bed without using bedrails?: A Little ?Help needed moving from lying on your back to sitting on the side of a flat bed without using bedrails?: A Little ?Help needed moving to and from a bed to a chair (including a wheelchair)?: A Little ?Help needed standing up from a chair using your arms (e.g., wheelchair or bedside chair)?: A Little ?Help needed to walk in hospital room?: A Lot ?Help needed climbing 3-5 steps with a railing? : A Lot ?6 Click Score: 16 ? ?  ?End of Session Equipment Utilized During Treatment: Gait belt ?Activity Tolerance: Patient tolerated treatment well ?Patient left: in chair;with call bell/phone within reach;with chair alarm set ?Nurse Communication: Mobility status ?PT Visit Diagnosis: Other abnormalities of gait and mobility  (R26.89);Hemiplegia and hemiparesis;Difficulty in walking, not elsewhere classified (R26.2);Muscle weakness (generalized) (M62.81) ?Hemiplegia - Right/Left: Right ?Hemiplegia - dominant/non-dominant: Dominant ?Hemiplegia - caused by: Cerebral infarction ?  ? ? ?Time: 0630-1601 ?PT Time Calculation (min) (ACUTE ONLY): 32 min ? ?Charges:  $Gait Training: 8-22 mins ?$Therapeutic Activity: 8-22 mins          ?          ? ?Raechel Chute PT, DPT  ?Alfonso Patten ?01/17/2022, 10:57 AM ? ?

## 2022-01-17 NOTE — H&P (Signed)
? ? ?Physical Medicine and Rehabilitation Admission H&P ? ?  ?Chief Complaint  ?Patient presents with  ? Neurologic Problem  ? Weakness  ?: ?HPI: Joseph Patrick is a 76 year old right-handed male with history of hypertension, hyperlipidemia, diabetes mellitus, CKD stage III, medical noncompliance, recent CVA left hemispheric subcortical with admission 01/06/2022 - 01/10/2022 and placed on low-dose aspirin and Plavix x3 weeks then aspirin alone complicated by hypertension initially on Norvasc and Entresto initiated after findings of chronic congestive heart failure with ejection fraction of 25 to 30%.  Per chart review lives alone.  1 level apartment with elevator.  Independent prior to recent CVA.  Presented 01/14/2022 with increasing right-sided weakness as well as stuttering speech.  CT/MRI showed acute infarct involving the left corona radiata, caudate body, and posterior lentiform nucleus.  Recent echocardiogram with ejection fraction of 25 to 30% left ventricle demonstrated global hypokinesis grade 2 diastolic dysfunction.  Admission chemistries unremarkable aside glucose 136 creatinine 1.43, urine drug screen negative.  Neurology follow-up patient currently remains on low-dose aspirin and Plavix.  Lovenox for DVT prophylaxis.  Tolerating a regular consistency diet.  Therapy evaluations completed due to patient decreased functional mobility right side weakness was admitted for a comprehensive rehab program. Has been noticing improved right sided strength. ? ?Review of Systems  ?Constitutional:  Negative for chills and fever.  ?HENT:  Negative for hearing loss.   ?Eyes:  Negative for blurred vision and double vision.  ?Respiratory:  Negative for cough and shortness of breath.   ?Cardiovascular:  Negative for chest pain, palpitations and leg swelling.  ?Gastrointestinal:  Positive for constipation. Negative for heartburn, nausea and vomiting.  ?Genitourinary:  Negative for dysuria, flank pain and hematuria.   ?Musculoskeletal:  Positive for joint pain and myalgias.  ?Skin:  Negative for rash.  ?Neurological:  Positive for speech change and weakness.  ?All other systems reviewed and are negative. ?Past Medical History:  ?Diagnosis Date  ? CVA (cerebral vascular accident) [facility])   ? april 2023  ? Diabetes mellitus without complication ([place])   ? Hypertension   ? ?History reviewed. No pertinent surgical history. ?Family History  ?Problem Relation Age of Onset  ? Heart failure Sister   ? ?Social History:  reports that he has never smoked. He has never used smokeless tobacco. He reports that he does not drink alcohol and does not use drugs. ?Allergies:  ?Allergies  ?Allergen Reactions  ? Amlodipine Itching  ? ?Medications Prior to Admission  ?Medication Sig Dispense Refill  ? aspirin 81 MG EC tablet Take 1 tablet (81 mg total) by mouth daily. Swallow whole. 30 tablet 11  ? atorvastatin (LIPITOR) 80 MG tablet Take 1 tablet (80 mg total) by mouth daily. 30 tablet 0  ? clopidogrel (PLAVIX) 75 MG tablet Take 1 tablet (75 mg total) by mouth daily for 18 days. 18 tablet 0  ? dapagliflozin propanediol (FARXIGA) 5 MG TABS tablet Take 1 tablet (5 mg total) by mouth daily before breakfast. 30 tablet 0  ? isosorbide-hydrALAZINE (BIDIL) 20-37.5 MG tablet Take 1 tablet by mouth 3 (three) times daily. 30 tablet 0  ? metoprolol tartrate (LOPRESSOR) 50 MG tablet Take 1 tablet (50 mg total) by mouth 2 (two) times daily. 60 tablet 0  ? sacubitril-valsartan (ENTRESTO) 24-26 MG Take 1 tablet by mouth 2 (two) times daily. 60 tablet 0  ? ? ? ? ?Home: ?Home Living ?Family/patient expects to be discharged to:: Private residence ?Living Arrangements: Alone ?Available Help at Discharge: Available PRN/intermittently ?Type of  Home: Apartment ?Home Access: Elevator ?Home Layout: One level ?Bathroom Shower/Tub: Walk-in shower ?Home Equipment: None ?  ?Functional History: ?Prior Function ?Prior Level of Function : Independent/Modified Independent,  Driving ?Mobility Comments: reports use of cane, walker, and rollator ?ADLs Comments: no assistance ? ?Functional Status:  ?Mobility: ?Bed Mobility ?Overal bed mobility: Needs Assistance ?Bed Mobility: Supine to Sit, Sit to Supine ?Rolling: Supervision ?Sidelying to sit: Supervision ?Supine to sit: Min guard ?Sit to supine: Min guard ?Sit to sidelying: Supervision ?General bed mobility comments: pt sitting EOB eating lunch upon PT arrival ?Transfers ?Overall transfer level: Needs assistance ?Equipment used:  (EVA walker) ?Transfers: Sit to/from Stand, Bed to chair/wheelchair/BSC ?Sit to Stand: Min assist ?Bed to/from chair/wheelchair/BSC transfer type:: Stand pivot ?Stand pivot transfers: Mod assist ?General transfer comment: minA to power up, dependent for R UE management up onto EVA walker ?Ambulation/Gait ?Ambulation/Gait assistance: Mod assist, +2 safety/equipment ?Gait Distance (Feet): 120 Feet ?Assistive device:  (EVA) ?Gait Pattern/deviations: Step-to pattern, Step-through pattern, Knee hyperextension - right ?General Gait Details: EVA walker to help negate limitations of the R UE, paretic gait R LE. Pt with R knee hyperextension in stance phase, more prominate with onset of fatigue, tactile cues t/o session to prevent R LE adduction and to clear R foot in addition to preventing R knee hyperextension. Rehab tech to help pt navigate eva walker from the front ?Gait velocity: decreased ?Gait velocity interpretation: <1.8 ft/sec, indicate of risk for recurrent falls ?Pre-gait activities: marching and weight shifting to R ?  ? ?ADL: ?ADL ?Overall ADL's : Needs assistance/impaired ?Eating/Feeding: Set up, Sitting ?Grooming: Set up, Sitting ?Upper Body Bathing: Minimal assistance, Sitting ?Lower Body Bathing: Moderate assistance, Sitting/lateral leans ?Upper Body Dressing : Minimal assistance, Sitting, Cueing for compensatory techniques ?Lower Body Dressing: Moderate assistance, Sitting/lateral leans ?Toilet  Transfer: Moderate assistance, Stand-pivot (simulated to recliner) ?Toilet Transfer Details (indicate cue type and reason): simulated in room ?Toileting- Clothing Manipulation and Hygiene: Sit to/from stand, Sitting/lateral lean, Minimal assistance ?Functional mobility during ADLs: Moderate assistance (stand pivot only) ?General ADL Comments: focused session on RUE exercises and normalizing movement patterns ? ?Cognition: ?Cognition ?Overall Cognitive Status: Within Functional Limits for tasks assessed ?Orientation Level: Oriented X4 ?Cognition ?Arousal/Alertness: Awake/alert ?Behavior During Therapy: [facility] for tasks assessed/performed ?Overall Cognitive Status: Within Functional Limits for tasks assessed ?General Comments: motivated, tangential and requires redirection at times. Slightly perseverative. ? ?Physical Exam: ?Blood pressure (!) 151/68, pulse (!) 59, temperature 98 ?F (36.7 ?C), temperature source Oral, resp. rate 16, height 5\' 8"  (1.727 m), weight 78.9 kg, SpO2 97 %. ?Physical Exam ?Gen: no distress, normal appearing, BMI 26.45 ?HEENT: oral mucosa pink and moist, NCAT ?Cardio: Bradycardia ?Chest: normal effort, normal rate of breathing ?Abd: soft, non-distended ?Ext: no edema ?Psych: pleasant, normal affect, bright and positive ?Skin: intact ?Neurological:  ?   Comments: Patient is alert.  Makes eye contact with examiner.  Provides name and age.  Follows simple commands. 2/5 RUE with the exception of 3/5 EF. 3/5 RLE except for 0/5 EHL and DF. Sensation is intact.  ? ?Results for orders placed or performed during the hospital encounter of 01/14/22 (from the past 48 hour(s))  ?Glucose, capillary     Status: Abnormal  ? Collection Time: 01/20/22 11:16 AM  ?Result Value Ref Range  ? Glucose-Capillary 116 (H) 70 - 99 mg/dL  ?  Comment: Glucose reference range applies only to samples taken after fasting for at least 8 hours.  ?Glucose, capillary     Status: Abnormal  ?  Collection Time: 01/20/22  3:51 PM   ?Result Value Ref Range  ? Glucose-Capillary 217 (H) 70 - 99 mg/dL  ?  Comment: Glucose reference range applies only to samples taken after fasting for at least 8 hours.  ?Glucose, capillary     Status: Abnormal  ? Collection Time:

## 2022-01-17 NOTE — Discharge Summary (Addendum)
? ?Name: Joseph Patrick ?MRN: [id number] ?DOB: [birth year] 76 y.o. ?PCP: Arthur Holms, NP ? ?Date of Admission: 01/14/2022 11:06 AM ?Date of Discharge:  01/22/22 ?Attending Physician: Dr. Daryll Drown ? ?DISCHARGE DIAGNOSIS:  ?Primary Problem: Acute ischemic left MCA stroke ([place])  ? ?Hospital Problems: ?Principal Problem: ?  Acute ischemic left MCA stroke ([place]) ?  ? ?DISCHARGE MEDICATIONS:  ? ?Allergies as of 01/22/2022   ? ?   Reactions  ? Amlodipine Itching  ? ?  ? ?  ?Medication List  ?  ? ?STOP taking these medications   ? ?Entresto 24-26 MG ?Generic drug: sacubitril-valsartan ?Replaced by: sacubitril-valsartan 49-51 MG ?  ?metoprolol tartrate 50 MG tablet ?Commonly known as: LOPRESSOR ?  ? ?  ? ?TAKE these medications   ? ?acetaminophen 325 MG tablet ?Commonly known as: TYLENOL ?Take 2 tablets (650 mg total) by mouth every 4 (four) hours as needed for mild pain (or temp > 37.5 C (99.5 F)). ?  ?Aspirin Low Dose 81 MG EC tablet ?Generic drug: aspirin ?Take 1 tablet (81 mg total) by mouth daily. Swallow whole. ?  ?atorvastatin 40 MG tablet ?Commonly known as: LIPITOR ?Take 1 tablet (40 mg total) by mouth daily. ?What changed:  ?medication strength ?how much to take ?  ?clopidogrel 75 MG tablet ?Commonly known as: PLAVIX ?Take 1 tablet (75 mg total) by mouth daily for 7 days. ?  ?Farxiga 5 MG Tabs tablet ?Generic drug: dapagliflozin propanediol ?Take 1 tablet (5 mg total) by mouth daily before breakfast. ?  ?isosorbide-hydrALAZINE 20-37.5 MG tablet ?Commonly known as: BIDIL ?Take 1 tablet by mouth 3 (three) times daily. ?  ?metoprolol succinate 100 MG 24 hr tablet ?Commonly known as: TOPROL-XL ?Take 1 tablet (100 mg total) by mouth daily. Take with or immediately following a meal. ?  ?sacubitril-valsartan 49-51 MG ?Commonly known as: ENTRESTO ?Take 1 tablet by mouth 2 (two) times daily. ?Replaces: Entresto 24-26 MG ?  ?senna-docusate 8.6-50 MG tablet ?Commonly known as: Senokot-S ?Take 1 tablet by mouth at bedtime as needed for  mild constipation. ?  ? ?  ? ? ?DISPOSITION AND FOLLOW-UP:  ?Mr.Jaeshaun Perret was discharged from [facility] in stable condition to CIR. At the hospital follow up visit please address: ? ?Follow-up Recommendations: ?Consults: none ?Labs: BMP  ?Studies: none  ?Medications: Continue Plavix until 5/14 along with Aspirin, and then continue Aspirin alone indefinitely  ? ?Follow-up Appointments: ? Follow-up Information   ? ? Arthur Holms, NP. Call in 1 day(s).   ?Specialty: Nurse Practitioner ?Why: call to schedule a post hospital PCP visit with your provider ?Contact information: ?[address] ?[place] [address] ?[phone] ? ? ?  ?  ? ?  ?  ? ?  ? ? ?HOSPITAL COURSE:  ?Patient Summary: ?Acute CVA of left MCA ?History of left frontoparietal infarct ?Patient presented with several days of worsening coordination and weakness in right upper and lower extremity. CT showed 1.6 decreased attenuation in the posterior limb of left internal capsule suggesting evolving infarct in the left MCA.  MRI showed acute infarction involving the left corona radiata, caudate body, and posterior lentiform nucleus  Original symptoms presentation on 4/22 were consistent with current stroke distribution however MRI at that time showed CVA of left frontoparietal area. CT head neck from 4/22 showed no intracranial large vessel occlusion, but showed moderate/severe stenosis into left MCA and severe stenosis of left PCA. Neurology was consulted. There was concern for Plavix non-responder given recurrent CVA despite on DAPT therapy ->  P2Y12 test resulted below the reference range indicating Plavix is working as expected. Neurology signed off, recommending medical therapy with no further work up at this time.  ?-- Continue ASA 81 mg daily indefinitely  ?-- Continue clopidogrel 75 mg, last day 5/14  ? ?Anticipate further improvement in symptoms with PT/OT, and with CIR.  ?  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 Home  medications include sacubitril-valsartan, metoprolol tartrate (switched to succinate during admission for evidence based medicine), isosorbide-hydralazine, and dapagliflozin. Echo from 4/23 showed EF of 25 to 30% with global hypokinesis of left ventricle with functional mitral valve regurgitation.  Heart failure attributed to longstanding uncontrolled hypertension. Follows with Dr. Johnsie Cancel since last admission. Euvolemic this admission, with stable weight. Medications are re-started at time of discharge.  ? ?Severe asymptomatic hypertension, resolved  ?Hx of HTN ?Blood pressure is elevated into the 190's/90's on admission. He reports taking his blood pressure medications after being discharged from the hospital 01/10/22. Systolic blood pressure readings at home have varied in the 160-200's. Outside of permissive HTN window given delayed presentation. Re-started  sacubitril-valsartan BID, metoprolol succinate 100 mg QD, and isosorbide-hydralazine TID. Up-titrated his Entresto, with better control of his BP.  ?  ?Type 2 diabetes ?Medications include dapagliflozin which was continued. Recent Hemoglobin A1c at 7.1. SSI during admission with good control, CGM's <180.  ? ?CKD 3a ?Chronic and stable at baseline of 1.4-1.5. At baseline.  ?  ? ?DISCHARGE INSTRUCTIONS:  ? ?Discharge Instructions   ? ? Call MD for:  difficulty breathing, headache or visual disturbances   Complete by: As directed ?  ? Call MD for:  extreme fatigue   Complete by: As directed ?  ? Call MD for:  hives   Complete by: As directed ?  ? Call MD for:  persistant dizziness or light-headedness   Complete by: As directed ?  ? Call MD for:  persistant nausea and vomiting   Complete by: As directed ?  ? Call MD for:  redness, tenderness, or signs of infection (pain, swelling, redness, odor or green/yellow discharge around incision site)   Complete by: As directed ?  ? Call MD for:  severe uncontrolled pain   Complete by: As directed ?  ? Call MD for:   temperature >100.4   Complete by: As directed ?  ? Diet - low sodium heart healthy   Complete by: As directed ?  ? Increase activity slowly   Complete by: As directed ?  ? ?  ? ? ?SUBJECTIVE:  ?No acute overnight events. ? ?Patient was seen at bedside during rounds today. Pt is preparing to work with PT. He states that he has noticed improvement on his right side of his body. He does complain of generalized fatigue. Overall, he appears to be excited about the progress he has made, and appears to be motivated to continue to make progress. Pt denies any new weakness ,SHOB with ambulation, and chest pain.  ? ?No other complains or concerns at this time.  ? ?All questions were addressed with patient prior to being discharged.  ? ?Discharge Vitals:   ?BP (!) 151/68 (BP Location: Right Arm)   Pulse (!) 59   Temp 98 ?F (36.7 ?C) (Oral)   Resp 16   Ht 5\' 8"  (1.727 m)   Wt 78.9 kg   SpO2 97%   BMI 26.45 kg/m?  ? ?OBJECTIVE:  ?Constitutional: alert, well appearing, and in no acute distress  ?HENT: normocephalic, mucous membranes moist ?Cardiovascular: RRR,  2/6 systolic murmur appreciated in left sternal border ?Pulmonary/Chest: normal work of breathing on room air, LCTAB ?MSK: no edema present in lower extremities bilaterally ?Neurological: alert & oriented x 3, sensation intact. 5/5 strength in left upper and lower extremity, 4/5 strength in right upper and lower extremity (exam improved compared to admission).  ?Skin: warm and dry ? ?Pertinent Labs, Studies, and Procedures:  ? ?  Latest Ref Rng & Units 01/14/2022  ? 11:45 AM 01/14/2022  ? 11:35 AM 01/06/2022  ? 12:40 PM  ?CBC  ?WBC 4.0 - 10.5 K/uL  7.5     ?Hemoglobin 13.0 - 17.0 g/dL 15.6   15.1   15.3    ?Hematocrit 39.0 - 52.0 % 46.0   45.5   45.0    ?Platelets 150 - 400 K/uL  234     ? ? ? ?  Latest Ref Rng & Units 01/17/2022  ?  4:08 AM 01/16/2022  ?  3:03 AM 01/15/2022  ?  7:26 AM  ?CMP  ?Glucose 70 - 99 mg/dL 123   129   181    ?BUN 8 - 23 mg/dL 24   24   19      ?Creatinine 0.61 - 1.24 mg/dL 1.31   1.41   1.55    ?Sodium 135 - 145 mmol/L 138   136   139    ?Potassium 3.5 - 5.1 mmol/L 3.7   4.1   3.4    ?Chloride 98 - 111 mmol/L 105   105   105    ?CO2 22 - 32 mmol/L 27   25   27     ?Calc

## 2022-01-17 NOTE — Progress Notes (Signed)
? ?  Subjective: ?Overnight events: none  ? ?Patient was seen at bedside during rounds today. He reports working with PT, and has noticed improvement. He states that he has noticed improvement on his right side of his body. He does complain of generalized fatigue. Overall, he appears to be excited about the progress he has made, and appears to be motivated to continue to make progress.  ? ?Pt is updated on the plan for today, and all questions and concerns are addressed.  ? ?Objective: ? ?Vital signs in last 24 hours: ?Vitals:  ? 01/16/22 2313 01/17/22 0341 01/17/22 0809 01/17/22 0944  ?BP: (!) 174/83 (!) 151/113 (!) 161/74   ?Pulse: 64 70 (!) 56 64  ?Resp: 20 17    ?Temp: 98.2 ?F (36.8 ?C) 97.8 ?F (36.6 ?C) 97.6 ?F (36.4 ?C)   ?TempSrc: Oral Oral Oral   ?SpO2: 98% 100% 95%   ?Weight:      ?Height:      ? ?Constitutional: alert, well appearing, and in no acute distress  ?HENT: normocephalic, mucous membranes moist ?Cardiovascular: RRR, 2/6 systolic murmur appreciated in left sternal border ?Pulmonary/Chest: normal work of breathing on room air, LCTAB ?MSK: no edema present in lower extremities bilaterally ?Neurological: alert & oriented x 3, sensation intact. 5/5 strength in left upper and lower extremity, 4/5 strength in right upper and lower extremity (exam improved compared to admission).  ?Skin: warm and dry ? ?Assessment/Plan: ? ?Principal Problem: ?  Acute ischemic left MCA stroke ([place]) ? ?Joseph Patrick is a 76 y.o. male with a pertinent PMH of hypertension, hyperlipidemia, well-controlled diabetes, and CKD stage 3a admitted for acute CVA of the left MCA.  ? ?Acute CVA of left MCA ?Hx of left frontoparietal infarct ?Continues to make progress as he works with PT. Plan is to continue with medical therapy with no further work up at this time. Does complain of fatigue associated with medications; important to continue to enforce medication compliance. Anticipate further improvement in symptoms with ongoing PT/OT.  Approved by CIR. Medically ready for CIR, pending insurance authorization. ?-Continue ASA 81 mg daily ?-Continue clopidogrel 75 mg, last day 5/14  ?-Continue Atorvastatin 40 mg daily  ?-Outside of permissive hypertension window ?-Telemetry monitoring ?-PT/OT recommending CIR.  ?  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 Euvolemic on exam. On his home GDMT including, sacubitril-valsartan BID, metoprolol succinate 100 mg QD, isosorbide-hydralazine TID, and dapagliflozin now that he is outside of permissive HTN window.  ?-Strict I's and O's ?-Daily weights ?-Daily BMP ?  ?Severe asymptomatic hypertension, resolved  ?Hx of HTN  ?BP 190/90's on admission. BP remains elevated 160/70's despite starting back home Entresto 24/26 BID, metoprolol succinate 100 QD, and Bidil TID. No room to increase metoprolol succinate given HR 60's. Will increase Entresto 24/26 BID to 49/51 BID-- will decrease Atorvastatin 80 to 40 mg given potential concern for rhabdomyolysis d/t drug-drug interaction.  ?  ?T2DM ?Recent Hemoglobin A1c at 7.1. CGM's <180  ?-SSI ?-Continue home dapagliflozin  ?  ?CKD 3a ?Chronic and stable. Will hold off on daily BMP's at this time.  ? ? ?Best Practice: ?Diet: Diabetic diet ?IVF: N/A ?VTE: lovenox ?Code: Full ? ?[name], MD  ?Internal Medicine Resident, PGY-1 ?Pager: [phone] ?After 5pm on weekdays and 1pm on weekends: On Call pager [phone] ?

## 2022-01-18 DIAGNOSIS — I63512 Cerebral infarction due to unspecified occlusion or stenosis of left middle cerebral artery: Secondary | ICD-10-CM | POA: Diagnosis not present

## 2022-01-18 LAB — GLUCOSE, CAPILLARY
Glucose-Capillary: 122 mg/dL — ABNORMAL HIGH (ref 70–99)
Glucose-Capillary: 108 mg/dL — ABNORMAL HIGH (ref 70–99)
Glucose-Capillary: 175 mg/dL — ABNORMAL HIGH (ref 70–99)
Glucose-Capillary: 151 mg/dL — ABNORMAL HIGH (ref 70–99)

## 2022-01-18 MED ORDER — ATORVASTATIN CALCIUM 40 MG PO TABS
40.0000 mg | ORAL_TABLET | Freq: Every day | ORAL | Status: DC
  Administered 2022-01-18 – 2022-01-22 (×5): 40 mg via ORAL
  Filled 2022-01-18 (×5): qty 1

## 2022-01-18 MED ORDER — SACUBITRIL-VALSARTAN 49-51 MG PO TABS
1.0000 | ORAL_TABLET | Freq: Two times a day (BID) | ORAL | Status: DC
  Administered 2022-01-18 – 2022-01-22 (×9): 1 via ORAL
  Filled 2022-01-18 (×10): qty 1

## 2022-01-18 NOTE — Progress Notes (Signed)
Pt. Had a 5 beat run of v-tach but remains asymptomatic and currently in a sinus rhythm.  Carmel Sacramento, MD notified and acknowledged  ?

## 2022-01-18 NOTE — Progress Notes (Signed)
Inpatient Rehab Admissions Coordinator:  ? ?Received a denial from [facility]. Per [employer] MD pt does not meet medical criteria, despite having a diagnosis of CVA, which is a medicare approved diagnosis.  Met with patient at bedside and he is agreeable to appealing this decision.   I will start this today.  ? ?Shann Medal, PT, DPT ?Admissions Coordinator ?[phone] ?01/18/22  ?4:10 PM ? ?

## 2022-01-18 NOTE — Plan of Care (Signed)
?  Problem: Education: ?Goal: Knowledge of secondary prevention will improve ([facility]) ?01/18/2022 1751 by Carole Binning, RN ?Outcome: Progressing ?Note: Pt. Able to identify symptoms of stroke/TIA and verbalize that he should call 911 ?01/18/2022 1750 by Carole Binning, RN ?Outcome: Progressing ?  ?

## 2022-01-18 NOTE — Care Management Important Message (Signed)
Important Message ? ?Patient Details  ?Name: Joseph Patrick ?MRN: [id number] ?Date of Birth: 1946-04-24 ? ? ?Medicare Important Message Given:  Yes ? ? ? ? ?Lewis Keats ?01/18/2022, 3:00 PM ?

## 2022-01-18 NOTE — Progress Notes (Signed)
Inpatient Rehab Admissions Coordinator:  ? ?Spoke to CM at [facility].  She is working on case today to send to [employer] geologist and is hopeful for decision this afternoon.  Will continue to follow.  ? ?Estill Dooms, PT, DPT ?Admissions Coordinator ?[phone] ?01/18/22  ?10:46 AM ? ?

## 2022-01-18 NOTE — Progress Notes (Signed)
? ?  Subjective: ?Overnight events: none  ? ?Patient was seen at bedside during rounds today. He reports working with PT, and has noticed improvement. He states that he has noticed improvement on his right side of his body. He does complain of generalized fatigue especially on waking up. Overall, he appears to be excited about the progress he has made, and appears to be motivated to continue to make progress. ? ?Pt is updated on the plan for today, and all questions and concerns are addressed.  ? ?Objective: ? ?Vital signs in last 24 hours: ?Vitals:  ? 01/18/22 0408 01/18/22 0500 01/18/22 0741 01/18/22 1151  ?BP: (!) 155/72  (!) 153/78 (!) 155/88  ?Pulse: 63  62 63  ?Resp: 17  16 17   ?Temp: 97.8 ?F (36.6 ?C)  97.7 ?F (36.5 ?C) 98.7 ?F (37.1 ?C)  ?TempSrc: Oral  Oral Oral  ?SpO2: 100%  100% 100%  ?Weight:  78.5 kg    ?Height:      ? ?Constitutional: alert, well appearing, and in no acute distress  ?HENT: normocephalic, mucous membranes moist ?Cardiovascular: RRR, 2/6 systolic murmur appreciated in left sternal border ?Pulmonary/Chest: normal work of breathing on room air, LCTAB ?MSK: no edema present in lower extremities bilaterally ?Neurological: alert & oriented x 3, sensation intact. 5/5 strength in left upper and lower extremity, 4/5 strength in right upper and lower extremity (exam improved compared to admission).  ?Skin: warm and dry ? ?Assessment/Plan: ? ?Principal Problem: ?  Acute ischemic left MCA stroke ([place]) ? ?Joseph Patrick is a 76 y.o. male with a pertinent PMH of hypertension, hyperlipidemia, well-controlled diabetes, and CKD stage 3a admitted for acute CVA of the left MCA.  ? ?Acute CVA of left MCA ?Hx of left frontoparietal infarct ?Plan is to continue with medical therapy with no further work up at this time. Important to continue to enforce medication compliance. Anticipate further improvement in symptoms with ongoing PT at [facility].  ?It is our medical opinion that this patient would benefit from intense  therapy and close physician monitoring. He's had multiple CVAs and would benefit greatly from monitoring and frequent PT. This is consistent with PT evaluation and I agree with their assessment. Patient is also very motivated and would do well in AIR. Medically ready for CIR, pending insurance authorization. ?-Continue ASA 81 mg daily ?-Continue clopidogrel 75 mg, last day 5/14  ?-Continue Atorvastatin 40 mg daily  ?-Telemetry monitoring ?-PT/OT recommending CIR ?  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 Euvolemic on exam. On his home GDMT including, sacubitril-valsartan BID, metoprolol succinate 100 mg QD, isosorbide-hydralazine TID, and dapagliflozin. Suspect fatigue is from ongoing addition and titration of his medications. ?-Strict I's and O's ?-Daily weights ?-Daily BMP ?  ?Severe asymptomatic hypertension, resolved  ?Hx of HTN  ?BP 190/90's on admission. BP improved from 160/70 -> 140/80's with up-titration of Entresto.  ?  ?T2DM ?Recent Hemoglobin A1c at 7.1. CGM's <180.  ?-SSI ?-Continue home dapagliflozin  ?  ?CKD 3a ?Chronic and stable. No indication for daily BMPs at this time.  ? ? ?Best Practice: ?Diet: Diabetic diet ?IVF: N/A ?VTE: lovenox ?Code: Full ? ?[name], MD  ?Internal Medicine Resident, PGY-1 ?Pager: [phone] ?After 5pm on weekdays and 1pm on weekends: On Call pager [phone] ?

## 2022-01-18 NOTE — Progress Notes (Signed)
Physical Therapy Treatment ?Patient Details ?Name: Joseph Patrick ?MRN: [id number] ?DOB: March 02, 1946 ?Today's Date: 01/18/2022 ? ? ?History of Present Illness pt is a 76 y/o male admitted with progressing R arm and leg numbness/weakness.  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  MRI now shows new infarcts in the left corona radiata, caudate body and posterior lentiform nucleus.  PMHx: CVA, DM, HTN ? ?  ?PT Comments  ? ? Pt making steady improvement toward goals.  Lots of discussion on stroke process, stroke education, anticipated progression, normal movement.  Emphasis on transition and transfer safety and improvement technique, standing balance and progression of gait stability, quality. ?  ?Recommendations for follow up therapy are one component of a multi-disciplinary discharge planning process, led by the attending physician.  Recommendations may be updated based on patient status, additional functional criteria and insurance authorization. ? ?Follow Up Recommendations ? Acute inpatient rehab (3hours/day) ?  ?  ?Assistance Recommended at Discharge Frequent or constant Supervision/Assistance  ?Patient can return home with the following Assistance with cooking/housework;Assist for transportation;Help with stairs or ramp for entrance;A little help with walking and/or transfers;A little help with bathing/dressing/bathroom ?  ?Equipment Recommendations ? Other (comment) (TBD)  ?  ?Recommendations for Other Services Rehab consult ? ? ?  ?Precautions / Restrictions Precautions ?Precautions: Fall  ?  ? ?Mobility ? Bed Mobility ?Overal bed mobility: Needs Assistance ?Bed Mobility: Supine to Sit ?  ?  ?Supine to sit: Min guard ?  ?  ?General bed mobility comments: improvement with normalized movement bringing R Ue and LE over with less lag behind. ?  ? ?Transfers ?Overall transfer level: Needs assistance ?Equipment used: Rolling walker (2 wheels) ?Transfers: Sit  to/from Stand ?Sit to Stand: Min assist ?  ?  ?  ?  ?  ?General transfer comment: work on transfer safety, hand placement, coming more foward, appropriate foundation.  Worked on scoot transfers ?  ? ?Ambulation/Gait ?Ambulation/Gait assistance: Mod assist, +2 physical assistance, +2 safety/equipment ?Gait Distance (Feet): 100 Feet ?Assistive device: Rolling walker (2 wheels) ?Gait Pattern/deviations: Step-to pattern, Step-through pattern ?Gait velocity: decreased ?Gait velocity interpretation: <1.31 ft/sec, indicative of household ambulator ?  ?General Gait Details: working on w/shift, un-weighting, R toe off, passive knee flexion and swing through, symmetry of step lengths and width.  +2 assist helped keep R hand grip on the RW and control the RW. ? ? ?Stairs ?  ?  ?  ?  ?  ? ? ?Wheelchair Mobility ?  ? ?Modified Rankin (Stroke Patients Only) ?Modified Rankin (Stroke Patients Only) ?Pre-Morbid Rankin Score: No significant disability ?Modified Rankin: Moderately severe disability ? ? ?  ?Balance Overall balance assessment: Needs assistance ?Sitting-balance support: No upper extremity supported, Feet supported ?Sitting balance-Leahy Scale: Good ?Sitting balance - Comments: can reach outside BOS without LOB ?  ?Standing balance support: Bilateral upper extremity supported, During functional activity, Reliant on assistive device for balance ?Standing balance-Leahy Scale: Fair ?Standing balance comment: able to set his stance and maintain static stance without UE's or external support and min guard assists ?  ?  ?  ?  ?  ?  ?  ?  ?  ?  ?  ?  ? ?  ?Cognition Arousal/Alertness: Awake/alert ?Behavior During Therapy: [facility] for tasks assessed/performed ?Overall Cognitive Status: Within Functional Limits for tasks assessed ?  ?  ?  ?  ?  ?  ?  ?  ?  ?  ?  ?  ?  ?  ?  ?  ?  General Comments: motivated, tangential and requires redirection at times ?  ?  ? ?  ?Exercises   ? ?  ?General Comments   ?  ?  ? ?Pertinent Vitals/Pain Pain  Assessment ?Pain Assessment: Faces ?Faces Pain Scale: No hurt ?Pain Intervention(s): Monitored during session  ? ? ?Home Living   ?  ?  ?  ?  ?  ?  ?  ?  ?  ?   ?  ?Prior Function    ?  ?  ?   ? ?PT Goals (current goals can now be found in the care plan section) Acute Rehab PT Goals ?Patient Stated Goal: Get back to [place] ?PT Goal Formulation: With patient ?Time For Goal Achievement: 01/29/22 ?Potential to Achieve Goals: Good ?Progress towards PT goals: Progressing toward goals ? ?  ?Frequency ? ? ? Min 4X/week ? ? ? ?  ?PT Plan Current plan remains appropriate  ? ? ?Co-evaluation   ?  ?  ?  ?  ? ?  ?AM-PAC PT "6 Clicks" Mobility   ?Outcome Measure ? Help needed turning from your back to your side while in a flat bed without using bedrails?: A Little ?Help needed moving from lying on your back to sitting on the side of a flat bed without using bedrails?: A Little ?Help needed moving to and from a bed to a chair (including a wheelchair)?: A Little ?Help needed standing up from a chair using your arms (e.g., wheelchair or bedside chair)?: A Little ?Help needed to walk in hospital room?: Total ?Help needed climbing 3-5 steps with a railing? : Total ?6 Click Score: 14 ? ?  ?End of Session   ?Activity Tolerance: Patient tolerated treatment well ?Patient left: in bed;with call bell/phone within reach;with bed alarm set ?Nurse Communication: Mobility status ?PT Visit Diagnosis: Other abnormalities of gait and mobility (R26.89);Hemiplegia and hemiparesis;Difficulty in walking, not elsewhere classified (R26.2);Muscle weakness (generalized) (M62.81) ?Hemiplegia - Right/Left: Right ?Hemiplegia - dominant/non-dominant: Dominant ?Hemiplegia - caused by: Cerebral infarction ?  ? ? ?Time: 0981-1914 ?PT Time Calculation (min) (ACUTE ONLY): 38 min ? ?Charges:  $Gait Training: 8-22 mins ?$Therapeutic Activity: 8-22 mins ?$Neuromuscular Re-education: 8-22 mins          ?          ? ?01/18/2022 ? ?Jacinto Halim., PT ?Acute Rehabilitation  Services ?[phone]  (pager) ?[phone]  (office) ? ? ?Eliseo Gum Tate Zagal ?01/18/2022, 4:49 PM ? ?

## 2022-01-19 ENCOUNTER — Ambulatory Visit: Payer: Medicare HMO | Admitting: Nurse Practitioner

## 2022-01-19 DIAGNOSIS — I63512 Cerebral infarction due to unspecified occlusion or stenosis of left middle cerebral artery: Secondary | ICD-10-CM | POA: Diagnosis not present

## 2022-01-19 LAB — GLUCOSE, CAPILLARY
Glucose-Capillary: 125 mg/dL — ABNORMAL HIGH (ref 70–99)
Glucose-Capillary: 139 mg/dL — ABNORMAL HIGH (ref 70–99)
Glucose-Capillary: 131 mg/dL — ABNORMAL HIGH (ref 70–99)
Glucose-Capillary: 176 mg/dL — ABNORMAL HIGH (ref 70–99)
Glucose-Capillary: 153 mg/dL — ABNORMAL HIGH (ref 70–99)

## 2022-01-19 NOTE — Progress Notes (Signed)
Occupational Therapy Treatment ?Patient Details ?Name: Joseph LoaDavid Patrick ?MRN: [id number] ?DOB: September 30, 1945 ?Today's Date: 01/19/2022 ? ? ?History of present illness 76 y/o male admitted with progressing R arm and leg numbness/weakness.  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  MRI now shows new infarcts in the left corona radiata, caudate body and posterior lentiform nucleus.  PMHx: CVA, DM, HTN ?  ?OT comments ? Pt progressing towards established OT goals. Pt very motivated to participate in exercises. Focused session on engagement and AROM of RUE. Pt performing scapular retraction and elevation at EOB. Pt performing simulated cleaning task to challenge coordination and normalized movement patterns at RUE; requiring Min-Max hand over hand. Continue to recommend dc to AIR and will continue to follow acutely as admitted.   ? ?Recommendations for follow up therapy are one component of a multi-disciplinary discharge planning process, led by the attending physician.  Recommendations may be updated based on patient status, additional functional criteria and insurance authorization. ?   ?Follow Up Recommendations ? Acute inpatient rehab (3hours/day)  ?  ?Assistance Recommended at Discharge Intermittent Supervision/Assistance  ?Patient can return home with the following ? Two people to help with walking and/or transfers;A lot of help with bathing/dressing/bathroom;Assistance with cooking/housework;Direct supervision/assist for medications management;Direct supervision/assist for financial management;Help with stairs or ramp for entrance;Assist for transportation ?  ?Equipment Recommendations ? None recommended by OT (defer to next venue of care)  ?  ?Recommendations for Other Services Rehab consult ? ?  ?Precautions / Restrictions Precautions ?Precautions: Fall ?Restrictions ?Weight Bearing Restrictions: No  ? ? ?  ? ?Mobility Bed Mobility ?Overal bed mobility:  Needs Assistance ?Bed Mobility: Supine to Sit, Sit to Supine ?  ?  ?Supine to sit: Min guard ?Sit to supine: Min guard ?  ?General bed mobility comments: Min Guard A for safety ?  ? ?Transfers ?Overall transfer level: Needs assistance ?Equipment used: Rolling walker (2 wheels) ?Transfers: Sit to/from Stand, Bed to chair/wheelchair/BSC ?Sit to Stand: Min assist ?Stand pivot transfers: Mod assist ?  ?  ?  ?  ?General transfer comment: Min A for gaining balance in standing. Mod A for blocking R knee and gaining balance ?  ?  ?Balance Overall balance assessment: Needs assistance ?Sitting-balance support: No upper extremity supported, Feet supported ?Sitting balance-Leahy Scale: Good ?Sitting balance - Comments: can reach outside BOS without LOB ?  ?Standing balance support: Bilateral upper extremity supported, During functional activity, Reliant on assistive device for balance ?Standing balance-Leahy Scale: Fair ?Standing balance comment: able to set his stance and maintain static stance without UE's or external support and min guard assists ?  ?  ?  ?  ?  ?  ?  ?  ?  ?  ?  ?   ? ?ADL either performed or assessed with clinical judgement  ? ?ADL Overall ADL's : Needs assistance/impaired ?  ?  ?  ?  ?  ?  ?  ?  ?  ?  ?  ?  ?Toilet Transfer: Moderate assistance;Stand-pivot (simulated to recliner) ?  ?  ?  ?  ?  ?Functional mobility during ADLs: Moderate assistance (stand pivot only) ?General ADL Comments: focused session on RUE exercises and normalizing movement patterns ?  ? ?Extremity/Trunk Assessment Upper Extremity Assessment ?Upper Extremity Assessment: RUE deficits/detail ?RUE Deficits / Details: Decreased tricep engagement and grasp strength. Unable to perform digit extension. ?RUE Coordination: decreased fine motor;decreased gross motor ?  ?Lower Extremity Assessment ?  Lower Extremity Assessment: Defer to PT evaluation ?RLE Deficits / Details: quads/gross extensors 3/5, hams 3-, df 1/5, pf 2/5, hip flexors 2/5,  movement in synergy, numbness from shin down ?RLE Sensation: decreased light touch ?RLE Coordination: decreased fine motor ?LLE Deficits / Details: WFL ?  ?  ?  ? ?Vision   ?Vision Assessment?: No apparent visual deficits ?  ?Perception Perception ?Perception: Not tested ?  ?Praxis Praxis ?Praxis: Not tested ?  ? ?Cognition Arousal/Alertness: Awake/alert ?Behavior During Therapy: [facility] for tasks assessed/performed ?Overall Cognitive Status: Within Functional Limits for tasks assessed ?  ?  ?  ?  ?  ?  ?  ?  ?  ?  ?  ?  ?  ?  ?  ?  ?General Comments: motivated, tangential and requires redirection at times. Slightly perseverative. ?  ?  ?   ?Exercises Exercises: Other exercises, General Upper Extremity ?General Exercises - Upper Extremity ?Elbow Flexion: Right, Seated, AAROM, 10 reps ?Elbow Extension: Right, Seated, AROM, 10 reps (controled descent) ?Other Exercises ?Other Exercises: scapular retraction; hold 10 sec; x10; seated ?Other Exercises: scapular elevation; hold 5 sec; x10; seated ?Other Exercises: simulated cleaning task; using RUE to push wash clothe to four corners of table. challenging cooridnation. Min A for counter clockwise. Max hand over hand for clock wise. x15 each ? ?  ?Shoulder Instructions   ? ? ?  ?General Comments Brother present  ? ? ?Pertinent Vitals/ Pain       Pain Assessment ?Pain Assessment: Faces ?Faces Pain Scale: No hurt ?Pain Intervention(s): Monitored during session ? ?Home Living   ?  ?  ?  ?  ?  ?  ?  ?  ?  ?  ?  ?  ?  ?  ?  ?  ?  ?  ? ?  ?Prior Functioning/Environment    ?  ?  ?  ?   ? ?Frequency ? Min 3X/week  ? ? ? ? ?  ?Progress Toward Goals ? ?OT Goals(current goals can now be found in the care plan section) ? Progress towards OT goals: Progressing toward goals ? ?Acute Rehab OT Goals ?OT Goal Formulation: With patient ?Time For Goal Achievement: 01/29/22 ?Potential to Achieve Goals: Good ?ADL Goals ?Pt Will Perform Upper Body Dressing: with min assist;sitting;standing ?Pt Will  Perform Lower Body Dressing: with min assist;sitting/lateral leans;sit to/from stand ?Pt Will Transfer to Toilet: with min assist;bedside commode;stand pivot transfer ?Pt Will Perform Toileting - Clothing Manipulation and hygiene: with min guard assist;sitting/lateral leans;sit to/from stand  ?Plan Discharge plan remains appropriate;Frequency remains appropriate   ? ?Co-evaluation ? ? ?   ?  ?  ?  ?  ? ?  ?AM-PAC OT "6 Clicks" Daily Activity     ?Outcome Measure ? ? Help from another person eating meals?: None ?Help from another person taking care of personal grooming?: A Little ?Help from another person toileting, which includes using toliet, bedpan, or urinal?: A Lot ?Help from another person bathing (including washing, rinsing, drying)?: A Lot ?Help from another person to put on and taking off regular upper body clothing?: A Lot ?Help from another person to put on and taking off regular lower body clothing?: A Lot ?6 Click Score: 15 ? ?  ?End of Session Equipment Utilized During Treatment: Gait belt;Rolling walker (2 wheels) ? ?OT Visit Diagnosis: Unsteadiness on feet (R26.81);Muscle weakness (generalized) (M62.81);Other abnormalities of gait and mobility (R26.89);Other symptoms and signs involving the nervous system (R29.898);Hemiplegia and hemiparesis ?  ?  Activity Tolerance Patient tolerated treatment well ?  ?Patient Left with call bell/phone within reach;in bed;with bed alarm set;with family/visitor present ?  ?Nurse Communication Mobility status ?  ? ?   ? ?Time: 6160-7371 ?OT Time Calculation (min): 27 min ? ?Charges: OT General Charges ?$OT Visit: 1 Visit ?OT Treatments ?$Therapeutic Activity: 23-37 mins ? ?[place] MSOT, OTR/L ?Acute Rehab ?Pager: [phone] ?Office: [phone] ? ?Charnae Lill M Theoren Palka ?01/19/2022, 5:41 PM ? ? ?

## 2022-01-19 NOTE — Progress Notes (Signed)
Physical Therapy Treatment ?Patient Details ?Name: Joseph Patrick ?MRN: [id number] ?DOB: Jul 21, 1946 ?Today's Date: 01/19/2022 ? ? ?History of Present Illness 76 y/o male admitted with progressing R arm and leg numbness/weakness.  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  MRI now shows new infarcts in the left corona radiata, caudate body and posterior lentiform nucleus.  PMHx: CVA, DM, HTN ? ?  ?PT Comments  ? ?  Pt progressing well toward goals, limited today by RUE fatigue post OT session and general fatigue and paretic nature of R LE during gait.  Emphasis on toe off and swing through without hip hike and with facilitation of R hip translation forward.  Use of EVA today to negate some of the R UE fatigue. ?   ?Recommendations for follow up therapy are one component of a multi-disciplinary discharge planning process, led by the attending physician.  Recommendations may be updated based on patient status, additional functional criteria and insurance authorization. ? ?Follow Up Recommendations ? Acute inpatient rehab (3hours/day) ?  ?  ?Assistance Recommended at Discharge Frequent or constant Supervision/Assistance  ?Patient can return home with the following Assistance with cooking/housework;Assist for transportation;Help with stairs or ramp for entrance;A little help with walking and/or transfers;A little help with bathing/dressing/bathroom ?  ?Equipment Recommendations ? Other (comment) (TBD)  ?  ?Recommendations for Other Services Rehab consult ? ? ?  ?Precautions / Restrictions Precautions ?Precautions: Fall ?Restrictions ?Weight Bearing Restrictions: No  ?  ? ?Mobility ? Bed Mobility ?Overal bed mobility: Needs Assistance ?Bed Mobility: Supine to Sit, Sit to Supine ?  ?  ?Supine to sit: Min guard ?Sit to supine: Min guard ?  ?General bed mobility comments: Min Guard A for safety ?  ? ?Transfers ?Overall transfer level: Needs assistance ?Equipment  used: Rolling walker (2 wheels) ?Transfers: Sit to/from Stand, Bed to chair/wheelchair/BSC ?Sit to Stand: Min assist ?  ?  ?  ?  ?  ?General transfer comment: Min A for gaining balance in standing. ?  ? ?Ambulation/Gait ?Ambulation/Gait assistance: Mod assist ?Gait Distance (Feet): 110 Feet ?Assistive device:  (EVA) ?Gait Pattern/deviations: Step-to pattern, Step-through pattern ?Gait velocity: decreased ?Gait velocity interpretation: <1.31 ft/sec, indicative of household ambulator ?  ?General Gait Details: EVA walker to help negate limitations of the R UE, paretic gait R LE.  Worked on toe off, swing clearance without hip hike and with R hip translation.  Notable fatigue with frequent stops to regroup. ? ? ?Stairs ?  ?  ?  ?  ?  ? ? ?Wheelchair Mobility ?  ? ?Modified Rankin (Stroke Patients Only) ?Modified Rankin (Stroke Patients Only) ?Modified Rankin: Moderately severe disability ? ? ?  ?Balance Overall balance assessment: Needs assistance ?Sitting-balance support: No upper extremity supported, Feet supported ?Sitting balance-Leahy Scale: Good ?Sitting balance - Comments: can reach outside BOS without LOB ?  ?Standing balance support: Bilateral upper extremity supported, During functional activity, Reliant on assistive device for balance ?Standing balance-Leahy Scale: Fair ?Standing balance comment: able to set his stance and maintain static stance without UE's or external support and min guard assists ?  ?  ?  ?  ?  ?  ?  ?  ?  ?  ?  ?  ? ?  ?Cognition Arousal/Alertness: Awake/alert ?Behavior During Therapy: [facility] for tasks assessed/performed ?Overall Cognitive Status: Within Functional Limits for tasks assessed ?  ?  ?  ?  ?  ?  ?  ?  ?  ?  ?  ?  ?  ?  ?  ?  ?  General Comments: motivated, tangential and requires redirection at times. Slightly perseverative. ?  ?  ? ?  ?Exercises   ? ?  ?General Comments General comments (skin integrity, edema, etc.): Brother present ?  ?  ? ?Pertinent Vitals/Pain Pain  Assessment ?Pain Assessment: Faces ?Faces Pain Scale: No hurt ?Pain Intervention(s): Monitored during session  ? ? ?Home Living   ?  ?  ?  ?  ?  ?  ?  ?  ?  ?   ?  ?Prior Function    ?  ?  ?   ? ?PT Goals (current goals can now be found in the care plan section) Acute Rehab PT Goals ?PT Goal Formulation: With patient ?Time For Goal Achievement: 01/29/22 ?Potential to Achieve Goals: Good ?Progress towards PT goals: Progressing toward goals ? ?  ?Frequency ? ? ? Min 4X/week ? ? ? ?  ?PT Plan Current plan remains appropriate  ? ? ?Co-evaluation   ?  ?  ?  ?  ? ?  ?AM-PAC PT "6 Clicks" Mobility   ?Outcome Measure ? Help needed turning from your back to your side while in a flat bed without using bedrails?: A Little ?Help needed moving from lying on your back to sitting on the side of a flat bed without using bedrails?: A Little ?Help needed moving to and from a bed to a chair (including a wheelchair)?: A Lot ?Help needed standing up from a chair using your arms (e.g., wheelchair or bedside chair)?: A Little ?Help needed to walk in hospital room?: A Lot ?Help needed climbing 3-5 steps with a railing? : Total ?6 Click Score: 14 ? ?  ?End of Session   ?Activity Tolerance: Patient tolerated treatment well ?Patient left: in bed;with call bell/phone within reach;with bed alarm set ?Nurse Communication: Mobility status ?PT Visit Diagnosis: Other abnormalities of gait and mobility (R26.89);Hemiplegia and hemiparesis;Difficulty in walking, not elsewhere classified (R26.2);Muscle weakness (generalized) (M62.81) ?Hemiplegia - Right/Left: Right ?Hemiplegia - dominant/non-dominant: Dominant ?Hemiplegia - caused by: Cerebral infarction ?  ? ? ?Time: 1696-7893 ?PT Time Calculation (min) (ACUTE ONLY): 20 min ? ?Charges:  $Gait Training: 8-22 mins          ?          ? ?01/19/2022 ? ?Jacinto Halim., PT ?Acute Rehabilitation Services ?[phone]  (pager) ?[phone]  (office) ? ? ?Joseph Patrick ?01/19/2022, 6:52 PM ? ?

## 2022-01-19 NOTE — Plan of Care (Signed)
  Problem: Education: Goal: Knowledge of General Education information will improve Description Including pain rating scale, medication(s)/side effects and non-pharmacologic comfort measures Outcome: Progressing   

## 2022-01-20 LAB — GLUCOSE, CAPILLARY
Glucose-Capillary: 144 mg/dL — ABNORMAL HIGH (ref 70–99)
Glucose-Capillary: 116 mg/dL — ABNORMAL HIGH (ref 70–99)
Glucose-Capillary: 217 mg/dL — ABNORMAL HIGH (ref 70–99)
Glucose-Capillary: 142 mg/dL — ABNORMAL HIGH (ref 70–99)

## 2022-01-20 NOTE — Progress Notes (Signed)
Physical Therapy Treatment ?Patient Details ?Name: Joseph Patrick ?MRN: [id number] ?DOB: 1946-01-01 ?Today's Date: 01/20/2022 ? ? ?History of Present Illness 76 y/o male admitted with progressing R arm and leg numbness/weakness.  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  MRI now shows new infarcts in the left corona radiata, caudate body and posterior lentiform nucleus.  PMHx: CVA, DM, HTN ? ?  ?PT Comments  ? ? Pt continues to be motivated and eager to get up to [facility]. Focused on R LE strengthening and gait mechanics using eva walker today. Pt appreciative for therapy and HEP. Continue to recommend AIR upon d/c as pt was indep PTA and demonstrates excellent rehab potential. Acute PT to cont to follow. ?   ?Recommendations for follow up therapy are one component of a multi-disciplinary discharge planning process, led by the attending physician.  Recommendations may be updated based on patient status, additional functional criteria and insurance authorization. ? ?Follow Up Recommendations ? Acute inpatient rehab (3hours/day) ?  ?  ?Assistance Recommended at Discharge Frequent or constant Supervision/Assistance  ?Patient can return home with the following Assistance with cooking/housework;Assist for transportation;Help with stairs or ramp for entrance;A little help with walking and/or transfers;A little help with bathing/dressing/bathroom ?  ?Equipment Recommendations ? Other (comment) (TBD)  ?  ?Recommendations for Other Services Rehab consult ? ? ?  ?Precautions / Restrictions Precautions ?Precautions: Fall ?Restrictions ?Weight Bearing Restrictions: No  ?  ? ?Mobility ? Bed Mobility ?  ?  ?  ?  ?  ?  ?  ?General bed mobility comments: pt sitting EOB eating lunch upon PT arrival ?  ? ?Transfers ?Overall transfer level: Needs assistance ?Equipment used:  (EVA walker) ?Transfers: Sit to/from Stand, Bed to chair/wheelchair/BSC ?Sit to Stand: Min assist ?  ?  ?   ?  ?  ?General transfer comment: minA to power up, dependent for R UE management up onto EVA walker ?  ? ?Ambulation/Gait ?Ambulation/Gait assistance: Mod assist, +2 safety/equipment ?Gait Distance (Feet): 120 Feet ?Assistive device:  (EVA) ?Gait Pattern/deviations: Step-to pattern, Step-through pattern, Knee hyperextension - right ?Gait velocity: decreased ?Gait velocity interpretation: <1.8 ft/sec, indicate of risk for recurrent falls ?  ?General Gait Details: EVA walker to help negate limitations of the R UE, paretic gait R LE. Pt with R knee hyperextension in stance phase, more prominate with onset of fatigue, tactile cues t/o session to prevent R LE adduction and to clear R foot in addition to preventing R knee hyperextension. Rehab tech to help pt navigate eva walker from the front ? ? ?Stairs ?  ?  ?  ?  ?  ? ? ?Wheelchair Mobility ?  ? ?Modified Rankin (Stroke Patients Only) ?Modified Rankin (Stroke Patients Only) ?Pre-Morbid Rankin Score: No significant disability ?Modified Rankin: Moderately severe disability ? ? ?  ?Balance Overall balance assessment: Needs assistance ?Sitting-balance support: No upper extremity supported, Feet supported ?Sitting balance-Leahy Scale: Good ?Sitting balance - Comments: can reach outside BOS without LOB ?  ?Standing balance support: Bilateral upper extremity supported, During functional activity, Reliant on assistive device for balance ?Standing balance-Leahy Scale: Fair ?  ?  ?  ?  ?  ?  ?  ?  ?  ?  ?  ?  ?  ? ?  ?Cognition Arousal/Alertness: Awake/alert ?Behavior During Therapy: [facility] for tasks assessed/performed ?Overall Cognitive Status: Within Functional Limits for tasks assessed ?  ?  ?  ?  ?  ?  ?  ?  ?  ?  ?  ?  ?  ?  ?  ?  ?  General Comments: motivated, tangential and requires redirection at times. Slightly perseverative. ?  ?  ? ?  ?Exercises General Exercises - Lower Extremity ?Long Arc Quad: AROM, Right, 10 reps, Seated (slow and controlled) ?Hip Flexion/Marching:  AROM, Right, 10 reps, Seated ?Heel Raises: AROM, Right, 10 reps, Seated ?Other Exercises ?Other Exercises: worked on sit to stands with increased weight shift to the R provided by PT and no use of pt's hands to help push up ? ?  ?General Comments General comments (skin integrity, edema, etc.): VSS ?  ?  ? ?Pertinent Vitals/Pain Pain Assessment ?Pain Assessment: Faces ?Faces Pain Scale: No hurt ?Pain Intervention(s): Monitored during session  ? ? ?Home Living   ?  ?  ?  ?  ?  ?  ?  ?  ?  ?   ?  ?Prior Function    ?  ?  ?   ? ?PT Goals (current goals can now be found in the care plan section) Acute Rehab PT Goals ?Patient Stated Goal: Get back to [place] ?PT Goal Formulation: With patient ?Time For Goal Achievement: 01/29/22 ?Potential to Achieve Goals: Good ?Progress towards PT goals: Progressing toward goals ? ?  ?Frequency ? ? ? Min 4X/week ? ? ? ?  ?PT Plan Current plan remains appropriate  ? ? ?Co-evaluation   ?  ?  ?  ?  ? ?  ?AM-PAC PT "6 Clicks" Mobility   ?Outcome Measure ? Help needed turning from your back to your side while in a flat bed without using bedrails?: A Little ?Help needed moving from lying on your back to sitting on the side of a flat bed without using bedrails?: A Little ?Help needed moving to and from a bed to a chair (including a wheelchair)?: A Lot ?Help needed standing up from a chair using your arms (e.g., wheelchair or bedside chair)?: A Lot ?Help needed to walk in hospital room?: A Lot ?Help needed climbing 3-5 steps with a railing? : Total ?6 Click Score: 13 ? ?  ?End of Session Equipment Utilized During Treatment: Gait belt ?Activity Tolerance: Patient tolerated treatment well ?Patient left: in chair;with chair alarm set ?Nurse Communication: Mobility status ?PT Visit Diagnosis: Other abnormalities of gait and mobility (R26.89);Hemiplegia and hemiparesis;Difficulty in walking, not elsewhere classified (R26.2);Muscle weakness (generalized) (M62.81) ?Hemiplegia - Right/Left:  Right ?Hemiplegia - dominant/non-dominant: Dominant ?Hemiplegia - caused by: Cerebral infarction ?  ? ? ?Time: 4492-0100 ?PT Time Calculation (min) (ACUTE ONLY): 40 min ? ?Charges:  $Gait Training: 8-22 mins ?$Therapeutic Exercise: 8-22 mins ?$Neuromuscular Re-education: 8-22 mins          ?          ? ?Lewis Shock, PT, DPT ?Acute Rehabilitation Services ?Secure chat preferred ?Office #: [phone] ? ? ? ?Jinger Middlesworth M Maven Rosander ?01/20/2022, 2:17 PM ? ?

## 2022-01-20 NOTE — Progress Notes (Signed)
? ?Subjective:  ? ?Hospital day:6 ? ?Overnight event: No acute events overnight ? ?Interim History: States he is doing well. No acute complaints. Continues to have weakness on the right but denies any numbness or tingling. ? ?Objective: ? ?Vital signs in last 24 hours: ?Vitals:  ? 01/19/22 1208 01/19/22 1609 01/19/22 2108 01/20/22 0410  ?BP: 135/69 (!) 144/81 (!) 146/65   ?Pulse: 67 (!) 57 72   ?Resp: 19 19 18    ?Temp: 98.2 ?F (36.8 ?C) 98 ?F (36.7 ?C) 98.6 ?F (37 ?C)   ?TempSrc: Oral Oral Oral   ?SpO2: 98% 99% 100%   ?Weight:    77.3 kg  ?Height:      ? ? ?Filed Weights  ? 01/18/22 0500 01/19/22 0500 01/20/22 0410  ?Weight: 78.5 kg 77.5 kg 77.3 kg  ? ? ? ?Intake/Output Summary (Last 24 hours) at 01/20/2022 0735 ?Last data filed at 01/20/2022 [id number] ?Gross per 24 hour  ?Intake 351 ml  ?Output 1550 ml  ?Net -1199 ml  ? ?Net IO Since Admission: -7,830 mL [01/20/22 0735] ? ?Recent Labs  ?  01/19/22 ?2115 01/19/22 ?2203 01/20/22 ?[id number]  ?GLUCAP 176* 153* 144*  ?  ? ?Pertinent Labs: ? ?  Latest Ref Rng & Units 01/14/2022  ? 11:45 AM 01/14/2022  ? 11:35 AM 01/06/2022  ? 12:40 PM  ?CBC  ?WBC 4.0 - 10.5 K/uL  7.5     ?Hemoglobin 13.0 - 17.0 g/dL 15.6   15.1   15.3    ?Hematocrit 39.0 - 52.0 % 46.0   45.5   45.0    ?Platelets 150 - 400 K/uL  234     ? ? ? ?  Latest Ref Rng & Units 01/17/2022  ?  4:08 AM 01/16/2022  ?  3:03 AM 01/15/2022  ?  7:26 AM  ?CMP  ?Glucose 70 - 99 mg/dL 123   129   181    ?BUN 8 - 23 mg/dL 24   24   19     ?Creatinine 0.61 - 1.24 mg/dL 1.31   1.41   1.55    ?Sodium 135 - 145 mmol/L 138   136   139    ?Potassium 3.5 - 5.1 mmol/L 3.7   4.1   3.4    ?Chloride 98 - 111 mmol/L 105   105   105    ?CO2 22 - 32 mmol/L 27   25   27     ?Calcium 8.9 - 10.3 mg/dL 8.8   8.6   9.0    ? ? ?Imaging: ?No results found. ? ?Physical Exam ? ?General: Pleasant, well-appearing elderly man laying in bed. No acute distress. ?CV: RRR. II/VI systolic murmur.  No LE edema ?Pulmonary: Lungs CTAB. Normal effort. No wheezing or  rales. ?Abdominal: Soft, nontender, nondistended. Normal bowel sounds. ?Extremities: 2+ distal pulses. Normal ROM. ?Skin: Warm and dry. No obvious rash or lesions. ?Neuro: A&Ox3. Moves all extremities. Strength: 2/5 RUE, 3/5 RLE, 5/5 LUE and LLE. 1/5 grip strengths R hand, 5/5 in L hand. Unable to perform dorsiflexion of the R foot. Normal sensation to gross touch. ?Psych: Normal mood and affect ? ? ?Assessment/Plan: ?Joseph Patrick is a 76 y.o. male with hx of hypertension, hyperlipidemia, well-controlled diabetes, and CKD stage 3a admitted for acute CVA of the left MCA.  Pending insurance re-authorization.  ? ?Principal Problem: ?  Acute ischemic left MCA stroke ([place]) ? ?Acute CVA of left MCA ?Hx of left frontoparietal infarct ?Patient continues to have ongoing neuro  deficits as well as muscle fatigue in the right upper and lower extremities. Grip strength very weak on the right hand, unable to perform dorsiflexion of the right foot and unable to hold right upper extremity against gravity. Patient continues to be in need of intensive inpatient rehab in order to improve current deficits. Patient has great support system at home.  Per rehab coordinator, patient was not authorized for CIR. Medically, patient is a great candidate for CIR in the setting of multiple strokes, ongoing deficits and will require intensive rehab, patient's ability to perform the required hours of rehab as per day and great support system at home.  ?-- Pending insurance reauthorization, possible appeal ?--Continue ASA 81 mg daily ?--Continue clopidogrel 75 mg, last day 5/14  ?--Continue Atorvastatin 40 mg daily  ?--Telemetry monitoring ? 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 On his home GDMT including, sacubitril-valsartan BID, metoprolol succinate 100 mg QD, isosorbide-hydralazine TID, and dapagliflozin. ?-Strict I's and O's ?-Daily weights ?-Daily BMP ?  ?Hypertension ?BP much improved with SBP in the 130s to 140s after  recent increase in [place].   ?--Continue Entresto, Bidil, metoprolol ?  ?T2DM ?Recent Hemoglobin A1c at 7.1. CBGs in the 140s to 150s in the last 24 hours. ?--SSI ?--Continue home dapagliflozin  ?  ?CKD 3a ?Chronic and stable. No indication for daily BMPs at this time ? ?Diet: CM ?IVF: None ?VTE: Lovenox ?CODE: Full ? ?Prior to Admission Living Arrangement: Home ?Anticipated Discharge Location: Acute inpatient rehab ?Barriers to Discharge: Pending insurance re-authorization, appeal ?Dispo: Anticipated discharge in approximately 1-2 day(s).  ? ?Signed: ?Lacinda Axon, MD ?01/20/2022, 7:35 AM  ?Pager: [phone] ?Internal Medicine Teaching Service ?After 5pm on weekdays and 1pm on weekends: On Call pager: [phone] ? ?

## 2022-01-20 NOTE — Plan of Care (Signed)
?  Problem: Education: ?Goal: Knowledge of General Education information will improve ?Description: Including pain rating scale, medication(s)/side effects and non-pharmacologic comfort measures ?Outcome: Progressing ?  ?Problem: Health Behavior/Discharge Planning: ?Goal: Ability to manage health-related needs will improve ?Outcome: Progressing ?  ?Problem: Clinical Measurements: ?Goal: Ability to maintain clinical measurements within normal limits will improve ?Outcome: Progressing ?Goal: Will remain free from infection ?Outcome: Progressing ?Goal: Diagnostic test results will improve ?Outcome: Progressing ?Goal: Respiratory complications will improve ?Outcome: Progressing ?Goal: Cardiovascular complication will be avoided ?Outcome: Progressing ?  ?Problem: Activity: ?Goal: Risk for activity intolerance will decrease ?Outcome: Progressing ?  ?Problem: Nutrition: ?Goal: Adequate nutrition will be maintained ?Outcome: Progressing ?  ?Problem: Coping: ?Goal: Level of anxiety will decrease ?Outcome: Progressing ?  ?Problem: Elimination: ?Goal: Will not experience complications related to bowel motility ?Outcome: Progressing ?Goal: Will not experience complications related to urinary retention ?Outcome: Progressing ?  ?Problem: Pain Managment: ?Goal: General experience of comfort will improve ?Outcome: Progressing ?  ?Problem: Safety: ?Goal: Ability to remain free from injury will improve ?Outcome: Progressing ?  ?Problem: Skin Integrity: ?Goal: Risk for impaired skin integrity will decrease ?Outcome: Progressing ?  ?Problem: Education: ?Goal: Knowledge of disease or condition will improve ?Outcome: Progressing ?Goal: Knowledge of secondary prevention will improve (SELECT ALL) ?Outcome: Progressing ?Goal: Knowledge of patient specific risk factors will improve (INDIVIDUALIZE FOR PATIENT) ?Outcome: Progressing ?  ?Problem: Education: ?Goal: Knowledge of disease or condition will improve ?Outcome: Progressing ?Goal:  Knowledge of secondary prevention will improve (SELECT ALL) ?Outcome: Progressing ?Goal: Knowledge of patient specific risk factors will improve (INDIVIDUALIZE FOR PATIENT) ?Outcome: Progressing ?  ?Problem: Health Behavior/Discharge Planning: ?Goal: Ability to manage health-related needs will improve ?Outcome: Progressing ?  ?Problem: Education: ?Goal: Knowledge of secondary prevention will improve (SELECT ALL) ?Outcome: Progressing ?  ?

## 2022-01-21 DIAGNOSIS — I63512 Cerebral infarction due to unspecified occlusion or stenosis of left middle cerebral artery: Secondary | ICD-10-CM | POA: Diagnosis not present

## 2022-01-21 LAB — GLUCOSE, CAPILLARY
Glucose-Capillary: 144 mg/dL — ABNORMAL HIGH (ref 70–99)
Glucose-Capillary: 151 mg/dL — ABNORMAL HIGH (ref 70–99)
Glucose-Capillary: 110 mg/dL — ABNORMAL HIGH (ref 70–99)
Glucose-Capillary: 163 mg/dL — ABNORMAL HIGH (ref 70–99)

## 2022-01-21 NOTE — Progress Notes (Signed)
? ?Subjective:  ? ?Hospital day: 7 ? ?Overnight event: No acute events overnight ? ?Interim History: Patient evaluated at bedside laying comfortably in bed.  Patient has no new complaints.  States he worked with physical therapy yesterday and did pretty well.  Continues to have weakness on his right side.  Denies any numbness or tingling.  Frustrated about the insurance rejecting his authorization.  Informed him of our plan to appeal.  ? ?Objective: ? ?Vital signs in last 24 hours: ?Vitals:  ? 01/20/22 1935 01/20/22 2343 01/21/22 0359 01/21/22 0840  ?BP: 133/63 123/73 (!) 155/72 (!) 144/68  ?Pulse: 67 67 63 61  ?Resp: 18 18 18 18   ?Temp: 98 ?F (36.7 ?C) 98.4 ?F (36.9 ?C) 97.8 ?F (36.6 ?C) 98.6 ?F (37 ?C)  ?TempSrc: Oral Oral Oral Oral  ?SpO2: 98% 99% 100% 97%  ?Weight:   79.5 kg   ?Height:      ? ? ?Filed Weights  ? 01/19/22 0500 01/20/22 0410 01/21/22 0359  ?Weight: 77.5 kg 77.3 kg 79.5 kg  ? ? ? ?Intake/Output Summary (Last 24 hours) at 01/21/2022 1127 ?Last data filed at 01/21/2022 0631 ?Gross per 24 hour  ?Intake 360 ml  ?Output 1150 ml  ?Net -790 ml  ? ?Net IO Since Admission: -8,680 mL [01/21/22 1127] ? ?Recent Labs  ?  01/20/22 ?1551 01/20/22 ?2108 01/21/22 ?0630  ?GLUCAP 217* 142* 144*  ?  ? ?Pertinent Labs: ? ?  Latest Ref Rng & Units 01/14/2022  ? 11:45 AM 01/14/2022  ? 11:35 AM 01/06/2022  ? 12:40 PM  ?CBC  ?WBC 4.0 - 10.5 K/uL  7.5     ?Hemoglobin 13.0 - 17.0 g/dL 15.6   15.1   15.3    ?Hematocrit 39.0 - 52.0 % 46.0   45.5   45.0    ?Platelets 150 - 400 K/uL  234     ? ? ? ?  Latest Ref Rng & Units 01/17/2022  ?  4:08 AM 01/16/2022  ?  3:03 AM 01/15/2022  ?  7:26 AM  ?CMP  ?Glucose 70 - 99 mg/dL 123   129   181    ?BUN 8 - 23 mg/dL 24   24   19     ?Creatinine 0.61 - 1.24 mg/dL 1.31   1.41   1.55    ?Sodium 135 - 145 mmol/L 138   136   139    ?Potassium 3.5 - 5.1 mmol/L 3.7   4.1   3.4    ?Chloride 98 - 111 mmol/L 105   105   105    ?CO2 22 - 32 mmol/L 27   25   27     ?Calcium 8.9 - 10.3 mg/dL 8.8   8.6   9.0     ? ? ?Imaging: ?No results found. ? ?Physical Exam ? ?General: Pleasant, well-appearing elderly man laying in bed. No acute distress. ?CV: RRR. II/VI systolic murmur.  No LE edema ?Pulmonary: Lungs CTAB. Normal effort. No wheezing or rales. ?Abdominal: Soft, nontender, nondistended. Normal bowel sounds. ?Extremities: 2+ distal pulses. Normal ROM. ?Skin: Warm and dry. No obvious rash or lesions. ?Neuro: A&Ox3.  Normal sensation to gross touch.  Moves all extremities.  Strength: 2/5 RUE, 3/5 RLE, 5/5 both LLE and LUE.  Weak grip strength with right hand, normal grip strength with left hand.  Still unable to perform dorsiflexion of the R foot.  ?Psych: Normal mood and affect ? ? ?Assessment/Plan: ?Joseph Patrick is a 76 y.o. male with  hx of hypertension, hyperlipidemia, well-controlled diabetes, and CKD stage 3a admitted for acute CVA of the left MCA.  Pending expedited appeal for insurance authorization. ? ?Principal Problem: ?  Acute ischemic left MCA stroke ([place]) ? ?Acute CVA of left MCA ?Hx of left frontoparietal infarct ?No significant changes today.  Patient questions to have neurodeficits on the right upper and lower extremities.  Worked with physical therapy yesterday.  He continues to need inpatient rehab to help improve his current deficits.  Patient has great support system at home. Medically, patient is a great candidate for CIR in the setting of multiple strokes, ongoing deficits and will require intensive rehab, patient's ability to perform the required hours of rehab as per day and great support system at home.  ?--Pending expedited appeal for insurance authorization ?--Continue ASA 81 mg daily ?--Continue clopidogrel 75 mg, last day 5/14  ?--Continue Atorvastatin 40 mg daily  ?--Telemetry monitoring ? 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 On his home GDMT including, sacubitril-valsartan BID, metoprolol succinate 100 mg QD, isosorbide-hydralazine TID, and dapagliflozin. ?-Strict I's  and O's ?-Daily weights ?-Daily BMP ?  ?Hypertension ?BP remained stable with SBP in the 120s to 150s last 24 hours. Continue to monitor closely. ?--Continue Entresto, Bidil, metoprolol ?  ?T2DM ?Recent Hemoglobin A1c at 7.1. CBGs remains in the 140s to 150s. ?--SSI with meals ?--Continue home dapagliflozin  ?  ?CKD 3a ?Chronic and stable. No indication for daily BMPs at this time ? ?Diet: CM ?IVF: None ?VTE: Lovenox ?CODE: Full ? ?Prior to Admission Living Arrangement: Home ?Anticipated Discharge Location: Acute inpatient rehab ?Barriers to Discharge: Pending expedited appeal for insurance authorization ?Dispo: Anticipated discharge in approximately 1-2 day(s).  ? ?Signed: ?Lacinda Axon, MD ?01/21/2022, 11:27 AM  ?Pager: [phone] ?Internal Medicine Teaching Service ?After 5pm on weekdays and 1pm on weekends: On Call pager: [phone] ? ?

## 2022-01-22 ENCOUNTER — Other Ambulatory Visit: Payer: Self-pay

## 2022-01-22 ENCOUNTER — Encounter (HOSPITAL_COMMUNITY): Payer: Self-pay | Admitting: Physical Medicine and Rehabilitation

## 2022-01-22 ENCOUNTER — Inpatient Hospital Stay (HOSPITAL_COMMUNITY)
Admission: RE | Admit: 2022-01-22 | Discharge: 2022-02-08 | DRG: 057 | Disposition: A | Discharge status: Home Health | Payer: Medicare HMO | Source: Intra-hospital | Attending: Physical Medicine and Rehabilitation | Admitting: Physical Medicine and Rehabilitation

## 2022-01-22 DIAGNOSIS — I69351 Hemiplegia and hemiparesis following cerebral infarction affecting right dominant side: Secondary | ICD-10-CM | POA: Diagnosis present

## 2022-01-22 DIAGNOSIS — Z888 Allergy status to other drugs, medicaments and biological substances status: Secondary | ICD-10-CM | POA: Diagnosis not present

## 2022-01-22 DIAGNOSIS — Z91199 Patient's noncompliance with other medical treatment and regimen due to unspecified reason: Secondary | ICD-10-CM | POA: Diagnosis not present

## 2022-01-22 DIAGNOSIS — Z7902 Long term (current) use of antithrombotics/antiplatelets: Secondary | ICD-10-CM | POA: Diagnosis not present

## 2022-01-22 DIAGNOSIS — T733XXS Exhaustion due to excessive exertion, sequela: Secondary | ICD-10-CM | POA: Diagnosis not present

## 2022-01-22 DIAGNOSIS — R001 Bradycardia, unspecified: Secondary | ICD-10-CM | POA: Diagnosis present

## 2022-01-22 DIAGNOSIS — Z741 Need for assistance with personal care: Secondary | ICD-10-CM | POA: Diagnosis present

## 2022-01-22 DIAGNOSIS — I130 Hypertensive heart and chronic kidney disease with heart failure and stage 1 through stage 4 chronic kidney disease, or unspecified chronic kidney disease: Secondary | ICD-10-CM | POA: Diagnosis present

## 2022-01-22 DIAGNOSIS — Z8249 Family history of ischemic heart disease and other diseases of the circulatory system: Secondary | ICD-10-CM | POA: Diagnosis not present

## 2022-01-22 DIAGNOSIS — E1169 Type 2 diabetes mellitus with other specified complication: Secondary | ICD-10-CM | POA: Diagnosis not present

## 2022-01-22 DIAGNOSIS — N183 Chronic kidney disease, stage 3 unspecified: Secondary | ICD-10-CM | POA: Diagnosis present

## 2022-01-22 DIAGNOSIS — E785 Hyperlipidemia, unspecified: Secondary | ICD-10-CM | POA: Diagnosis present

## 2022-01-22 DIAGNOSIS — E663 Overweight: Secondary | ICD-10-CM | POA: Diagnosis present

## 2022-01-22 DIAGNOSIS — Z79899 Other long term (current) drug therapy: Secondary | ICD-10-CM

## 2022-01-22 DIAGNOSIS — I5032 Chronic diastolic (congestive) heart failure: Secondary | ICD-10-CM | POA: Diagnosis present

## 2022-01-22 DIAGNOSIS — M21371 Foot drop, right foot: Secondary | ICD-10-CM | POA: Diagnosis present

## 2022-01-22 DIAGNOSIS — Z6826 Body mass index (BMI) 26.0-26.9, adult: Secondary | ICD-10-CM

## 2022-01-22 DIAGNOSIS — E1122 Type 2 diabetes mellitus with diabetic chronic kidney disease: Secondary | ICD-10-CM | POA: Diagnosis present

## 2022-01-22 DIAGNOSIS — Z7982 Long term (current) use of aspirin: Secondary | ICD-10-CM | POA: Diagnosis not present

## 2022-01-22 DIAGNOSIS — E559 Vitamin D deficiency, unspecified: Secondary | ICD-10-CM | POA: Diagnosis present

## 2022-01-22 DIAGNOSIS — I63512 Cerebral infarction due to unspecified occlusion or stenosis of left middle cerebral artery: Secondary | ICD-10-CM

## 2022-01-22 DIAGNOSIS — I679 Cerebrovascular disease, unspecified: Secondary | ICD-10-CM | POA: Diagnosis present

## 2022-01-22 DIAGNOSIS — I10 Essential (primary) hypertension: Secondary | ICD-10-CM | POA: Diagnosis not present

## 2022-01-22 DIAGNOSIS — I69323 Fluency disorder following cerebral infarction: Secondary | ICD-10-CM

## 2022-01-22 LAB — GLUCOSE, CAPILLARY
Glucose-Capillary: 121 mg/dL — ABNORMAL HIGH (ref 70–99)
Glucose-Capillary: 131 mg/dL — ABNORMAL HIGH (ref 70–99)
Glucose-Capillary: 138 mg/dL — ABNORMAL HIGH (ref 70–99)
Glucose-Capillary: 157 mg/dL — ABNORMAL HIGH (ref 70–99)

## 2022-01-22 MED ORDER — CLOPIDOGREL BISULFATE 75 MG PO TABS: 75.0000 mg | ORAL_TABLET | Freq: Every day | ORAL | 0 refills | Status: DC

## 2022-01-22 MED ORDER — ATORVASTATIN CALCIUM 40 MG PO TABS
40.0000 mg | ORAL_TABLET | Freq: Every day | ORAL | Status: DC
  Administered 2022-01-23 – 2022-02-08 (×17): 40 mg via ORAL
  Filled 2022-01-22 (×17): qty 1

## 2022-01-22 MED ORDER — ACETAMINOPHEN 325 MG PO TABS: 650.0000 mg | ORAL_TABLET | ORAL | Status: DC | PRN

## 2022-01-22 MED ORDER — ASPIRIN 81 MG PO TBEC
81.0000 mg | DELAYED_RELEASE_TABLET | Freq: Every day | ORAL | Status: DC
  Administered 2022-01-23 – 2022-02-08 (×17): 81 mg via ORAL
  Filled 2022-01-22 (×17): qty 1

## 2022-01-22 MED ORDER — ISOSORB DINITRATE-HYDRALAZINE 20-37.5 MG PO TABS
1.0000 | ORAL_TABLET | Freq: Three times a day (TID) | ORAL | Status: DC
  Administered 2022-01-22 – 2022-01-25 (×11): 1 via ORAL
  Filled 2022-01-22 (×13): qty 1

## 2022-01-22 MED ORDER — METOPROLOL SUCCINATE ER 50 MG PO TB24
100.0000 mg | ORAL_TABLET | Freq: Every day | ORAL | Status: DC
  Administered 2022-01-23 – 2022-02-03 (×12): 100 mg via ORAL
  Filled 2022-01-22 (×12): qty 2

## 2022-01-22 MED ORDER — ENOXAPARIN SODIUM 40 MG/0.4ML IJ SOSY: 40.0000 mg | PREFILLED_SYRINGE | INTRAMUSCULAR | Status: DC

## 2022-01-22 MED ORDER — METOPROLOL SUCCINATE ER 100 MG PO TB24: 100.0000 mg | ORAL_TABLET | Freq: Every day | ORAL | 0 refills | Status: DC

## 2022-01-22 MED ORDER — ACETAMINOPHEN 650 MG RE SUPP: 650.0000 mg | RECTAL | Status: DC | PRN

## 2022-01-22 MED ORDER — ENOXAPARIN SODIUM 40 MG/0.4ML IJ SOSY
40.0000 mg | PREFILLED_SYRINGE | INTRAMUSCULAR | Status: DC
  Administered 2022-01-22 – 2022-01-28 (×7): 40 mg via SUBCUTANEOUS
  Filled 2022-01-22 (×7): qty 0.4

## 2022-01-22 MED ORDER — SACUBITRIL-VALSARTAN 49-51 MG PO TABS
1.0000 | ORAL_TABLET | Freq: Two times a day (BID) | ORAL | Status: DC
  Administered 2022-01-22 – 2022-02-08 (×34): 1 via ORAL
  Filled 2022-01-22 (×36): qty 1

## 2022-01-22 MED ORDER — CLOPIDOGREL BISULFATE 75 MG PO TABS
75.0000 mg | ORAL_TABLET | Freq: Every day | ORAL | Status: AC
  Administered 2022-01-23 – 2022-02-01 (×10): 75 mg via ORAL
  Filled 2022-01-22 (×10): qty 1

## 2022-01-22 MED ORDER — DAPAGLIFLOZIN PROPANEDIOL 5 MG PO TABS
5.0000 mg | ORAL_TABLET | Freq: Every day | ORAL | Status: DC
  Administered 2022-01-23 – 2022-01-24 (×2): 5 mg via ORAL
  Filled 2022-01-22 (×3): qty 1

## 2022-01-22 MED ORDER — INSULIN ASPART 100 UNIT/ML IJ SOLN
0.0000 [IU] | Freq: Three times a day (TID) | INTRAMUSCULAR | Status: DC
  Administered 2022-01-22: 2 [IU] via SUBCUTANEOUS
  Administered 2022-01-23 (×2): 3 [IU] via SUBCUTANEOUS
  Administered 2022-01-23 – 2022-01-24 (×2): 2 [IU] via SUBCUTANEOUS
  Administered 2022-01-24: 3 [IU] via SUBCUTANEOUS
  Administered 2022-01-24 – 2022-01-26 (×3): 2 [IU] via SUBCUTANEOUS
  Administered 2022-01-27: 3 [IU] via SUBCUTANEOUS
  Administered 2022-01-28 – 2022-01-29 (×3): 2 [IU] via SUBCUTANEOUS
  Administered 2022-01-29: 3 [IU] via SUBCUTANEOUS
  Administered 2022-01-30 – 2022-02-01 (×3): 2 [IU] via SUBCUTANEOUS
  Administered 2022-02-01: 3 [IU] via SUBCUTANEOUS
  Administered 2022-02-02 – 2022-02-03 (×3): 2 [IU] via SUBCUTANEOUS

## 2022-01-22 MED ORDER — ACETAMINOPHEN 325 MG PO TABS: 650.0000 mg | ORAL_TABLET | ORAL | 0 refills | Status: DC | PRN

## 2022-01-22 MED ORDER — ACETAMINOPHEN 160 MG/5ML PO SOLN: 650.0000 mg | ORAL | Status: DC | PRN

## 2022-01-22 MED ORDER — SENNOSIDES-DOCUSATE SODIUM 8.6-50 MG PO TABS: 1.0000 | ORAL_TABLET | Freq: Every evening | ORAL | Status: DC | PRN

## 2022-01-22 MED ORDER — SACUBITRIL-VALSARTAN 49-51 MG PO TABS: 1.0000 | ORAL_TABLET | Freq: Two times a day (BID) | ORAL | 0 refills | Status: DC

## 2022-01-22 MED ORDER — SENNOSIDES-DOCUSATE SODIUM 8.6-50 MG PO TABS: 1.0000 | ORAL_TABLET | Freq: Every evening | ORAL | 0 refills | Status: DC | PRN

## 2022-01-22 MED ORDER — ATORVASTATIN CALCIUM 40 MG PO TABS: 40.0000 mg | ORAL_TABLET | Freq: Every day | ORAL | 0 refills | Status: DC

## 2022-01-22 NOTE — H&P (Signed)
? ? ?Physical Medicine and Rehabilitation Admission H&P ? ?CC: CVA ? ?HPI: Joseph Patrick is a 76 year old right-handed male with history of hypertension, hyperlipidemia, diabetes mellitus, CKD stage III, medical noncompliance, recent CVA left hemispheric subcortical with admission 01/06/2022 - 01/10/2022 and placed on low-dose aspirin and Plavix x3 weeks then aspirin alone complicated by hypertension initially on Norvasc and Entresto initiated after findings of chronic congestive heart failure with ejection fraction of 25 to 30%.  Per chart review lives alone.  1 level apartment with elevator.  Independent prior to recent CVA.  Presented 01/14/2022 with increasing right-sided weakness as well as stuttering speech.  CT/MRI showed acute infarct involving the left corona radiata, caudate body, and posterior lentiform nucleus.  Recent echocardiogram with ejection fraction of 25 to 30% left ventricle demonstrated global hypokinesis grade 2 diastolic dysfunction.  Admission chemistries unremarkable aside glucose 136 creatinine 1.43, urine drug screen negative.  Neurology follow-up patient currently remains on low-dose aspirin and Plavix.  Lovenox for DVT prophylaxis.  Tolerating a regular consistency diet.  Therapy evaluations completed due to patient decreased functional mobility right side weakness was admitted for a comprehensive rehab program. Has been noticing improved right sided strength.  ? ?Review of Systems  ?Constitutional:  Negative for chills and fever.  ?HENT:  Negative for hearing loss.   ?Eyes:  Negative for blurred vision and double vision.  ?Respiratory:  Negative for cough and shortness of breath.   ?Cardiovascular:  Negative for chest pain, palpitations and leg swelling.  ?Gastrointestinal:  Positive for constipation. Negative for heartburn, nausea and vomiting.  ?Genitourinary:  Negative for dysuria, flank pain and hematuria.  ?Musculoskeletal:  Positive for joint pain and myalgias.  ?Skin:  Negative for  rash.  ?Neurological:  Positive for speech change and weakness.  ?All other systems reviewed and are negative. ?Past Medical History:  ?Diagnosis Date  ? CVA (cerebral vascular accident) [facility])   ? april 2023  ? Diabetes mellitus without complication ([place])   ? Hypertension   ? ?History reviewed. No pertinent surgical history. ?Family History  ?Problem Relation Age of Onset  ? Heart failure Sister   ? ?Social History:  reports that he has never smoked. He has never used smokeless tobacco. He reports that he does not drink alcohol and does not use drugs. ?Allergies:  ?Allergies  ?Allergen Reactions  ? Amlodipine Itching  ? ?Medications Prior to Admission  ?Medication Sig Dispense Refill  ? acetaminophen (TYLENOL) 325 MG tablet Take 2 tablets (650 mg total) by mouth every 4 (four) hours as needed for mild pain (or temp > 37.5 C (99.5 F)). 30 tablet 0  ? aspirin 81 MG EC tablet Take 1 tablet (81 mg total) by mouth daily. Swallow whole. 30 tablet 11  ? atorvastatin (LIPITOR) 40 MG tablet Take 1 tablet (40 mg total) by mouth daily. 30 tablet 0  ? clopidogrel (PLAVIX) 75 MG tablet Take 1 tablet (75 mg total) by mouth daily for 7 days. 7 tablet 0  ? dapagliflozin propanediol (FARXIGA) 5 MG TABS tablet Take 1 tablet (5 mg total) by mouth daily before breakfast. 30 tablet 0  ? isosorbide-hydrALAZINE (BIDIL) 20-37.5 MG tablet Take 1 tablet by mouth 3 (three) times daily. 30 tablet 0  ? metoprolol succinate (TOPROL-XL) 100 MG 24 hr tablet Take 1 tablet (100 mg total) by mouth daily. Take with or immediately following a meal. 30 tablet 0  ? sacubitril-valsartan (ENTRESTO) 49-51 MG Take 1 tablet by mouth 2 (two) times daily. 60 tablet 0  ?  senna-docusate (SENOKOT-S) 8.6-50 MG tablet Take 1 tablet by mouth at bedtime as needed for mild constipation. 30 tablet 0  ? ?Home: ?Home Living ?Family/patient expects to be discharged to:: Private residence ?Living Arrangements: Alone ?Available Help at Discharge: Available  PRN/intermittently ?Type of Home: Apartment ?Home Access: Elevator ?Home Layout: One level ?Bathroom Shower/Tub: Walk-in shower ?Home Equipment: None ?  ?Functional History: ?Prior Function ?Prior Level of Function : Independent/Modified Independent, Driving ?Mobility Comments: reports use of cane, walker, and rollator ?ADLs Comments: no assistance ?  ?Functional Status:  ?Mobility: ?Bed Mobility ?Overal bed mobility: Needs Assistance ?Bed Mobility: Supine to Sit, Sit to Supine ?Rolling: Supervision ?Sidelying to sit: Supervision ?Supine to sit: Min guard ?Sit to supine: Min guard ?Sit to sidelying: Supervision ?General bed mobility comments: pt sitting EOB eating lunch upon PT arrival ?Transfers ?Overall transfer level: Needs assistance ?Equipment used:  (EVA walker) ?Transfers: Sit to/from Stand, Bed to chair/wheelchair/BSC ?Sit to Stand: Min assist ?Bed to/from chair/wheelchair/BSC transfer type:: Stand pivot ?Stand pivot transfers: Mod assist ?General transfer comment: minA to power up, dependent for R UE management up onto EVA walker ?Ambulation/Gait ?Ambulation/Gait assistance: Mod assist, +2 safety/equipment ?Gait Distance (Feet): 120 Feet ?Assistive device:  (EVA) ?Gait Pattern/deviations: Step-to pattern, Step-through pattern, Knee hyperextension - right ?General Gait Details: EVA walker to help negate limitations of the R UE, paretic gait R LE. Pt with R knee hyperextension in stance phase, more prominate with onset of fatigue, tactile cues t/o session to prevent R LE adduction and to clear R foot in addition to preventing R knee hyperextension. Rehab tech to help pt navigate eva walker from the front ?Gait velocity: decreased ?Gait velocity interpretation: <1.8 ft/sec, indicate of risk for recurrent falls ?Pre-gait activities: marching and weight shifting to R ?  ?ADL: ?ADL ?Overall ADL's : Needs assistance/impaired ?Eating/Feeding: Set up, Sitting ?Grooming: Set up, Sitting ?Upper Body Bathing: Minimal  assistance, Sitting ?Lower Body Bathing: Moderate assistance, Sitting/lateral leans ?Upper Body Dressing : Minimal assistance, Sitting, Cueing for compensatory techniques ?Lower Body Dressing: Moderate assistance, Sitting/lateral leans ?Toilet Transfer: Moderate assistance, Stand-pivot (simulated to recliner) ?Toilet Transfer Details (indicate cue type and reason): simulated in room ?Toileting- Clothing Manipulation and Hygiene: Sit to/from stand, Sitting/lateral lean, Minimal assistance ?Functional mobility during ADLs: Moderate assistance (stand pivot only) ?General ADL Comments: focused session on RUE exercises and normalizing movement patterns ?  ?Cognition: ?Cognition ?Overall Cognitive Status: Within Functional Limits for tasks assessed ?Orientation Level: Oriented X4 ?Cognition ?Arousal/Alertness: Awake/alert ?Behavior During Therapy: [facility] for tasks assessed/performed ?Overall Cognitive Status: Within Functional Limits for tasks assessed ?General Comments: motivated, tangential and requires redirection at times. Slightly perseverative. ?  ? ?Physical Exam: ?Height 5\' 9"  (1.753 m), weight 74.2 kg. ?Physical Exam ?Gen: no distress, normal appearing, BMI 26.45 ?HEENT: oral mucosa pink and moist, NCAT ?Cardio: Bradycardia ?Chest: normal effort, normal rate of breathing ?Abd: soft, non-distended ?Ext: no edema ?Psych: pleasant, normal affect, bright and positive ?Skin: intact ?Neurological:  ?   Comments: Patient is alert and oriented x3.  Makes eye contact with examiner.  Provides name and age.  Follows simple commands. 2/5 RUE with the exception of 3/5 EF. 3/5 RLE except for 0/5 EHL and DF. Sensation is intact.  ? ?Results for orders placed or performed during the hospital encounter of 01/14/22 (from the past 48 hour(s))  ?Glucose, capillary     Status: Abnormal  ? Collection Time: 01/20/22  3:51 PM  ?Result Value Ref Range  ? Glucose-Capillary 217 (H) 70 - 99 mg/dL  ?  Comment: Glucose reference range applies  only to samples taken after fasting for at least 8 hours.  ?Glucose, capillary     Status: Abnormal  ? Collection Time: 01/20/22  9:08 PM  ?Result Value Ref Range  ? Glucose-Capillary 142 (H) 70 - 99 mg/dL  ?  Comment: G

## 2022-01-22 NOTE — Progress Notes (Signed)
Patient ID: Joseph Patrick, male   DOB: August 01, 1946, 76 y.o.   MRN: [id number] ?INPATIENT REHABILITATION ADMISSION NOTE ? ? ?Arrival Method: recliner ? ?   ?Mental Orientation: A&O x4 ? ? ?Assessment: completed per flowsheet ? ? ?Skin: intact no wounds  ? ? ?IV'S:  20 Gauge in the right wrist  ? ? ?Pain: 0/10 ? ? ?Tubes and Drains: none ? ? ?Safety Measures: side rails x 3 call don't fall policy explained ? ? ?Vital Signs: completed per flowsheets ? ? ?Height and Weight: see flowsheets ? ? ?Rehab Orientation: completed  ? ? ?Family: ? ? ? ?Notes:  ?

## 2022-01-22 NOTE — Progress Notes (Signed)
Report called at this time to [facility] in CIR. Handoff completed at this time.  ?

## 2022-01-22 NOTE — Progress Notes (Signed)
Inpatient Rehab Admissions Coordinator:   ? ?I have insurance approval and a bed available for pt to admit to CIR today. Dr. Allena Katz in agreement.  Will let pt/family and TOC team know.  ? ?Estill Dooms, PT, DPT ?Admissions Coordinator ?[phone] ?01/22/22  ?9:51 AM  ? ?

## 2022-01-22 NOTE — Progress Notes (Signed)
Physical Therapy Treatment ?Patient Details ?Name: Joseph Patrick ?MRN: [id number] ?DOB: 1945/11/11 ?Today's Date: 01/22/2022 ? ? ?History of Present Illness 76 y/o male admitted with progressing R arm and leg numbness/weakness.  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  MRI now shows new infarcts in the left corona radiata, caudate body and posterior lentiform nucleus.  PMHx: CVA, DM, HTN ? ?  ?PT Comments  ? ? Pt progressing towards all goals. Pt continues with memory deficits, impaired balance, R hemiparesis UE worse than LE, R LE drop foot during ambulation, requires RW for safe ambulation with assist of PT, and increased falls risk. Pt to continue to benefit from AIR upon d/c for maximal functional recovery. Pt motivated and eager to return to indep function. Acute PT to cont to follow. ?   ?Recommendations for follow up therapy are one component of a multi-disciplinary discharge planning process, led by the attending physician.  Recommendations may be updated based on patient status, additional functional criteria and insurance authorization. ? ?Follow Up Recommendations ? Acute inpatient rehab (3hours/day) ?  ?  ?Assistance Recommended at Discharge Frequent or constant Supervision/Assistance  ?Patient can return home with the following Assistance with cooking/housework;Assist for transportation;Help with stairs or ramp for entrance;A little help with walking and/or transfers;A little help with bathing/dressing/bathroom ?  ?Equipment Recommendations ? Other (comment) (TBD)  ?  ?Recommendations for Other Services Rehab consult ? ? ?  ?Precautions / Restrictions Precautions ?Precautions: Fall ?Restrictions ?Weight Bearing Restrictions: No  ?  ? ?Mobility ? Bed Mobility ?Overal bed mobility: Needs Assistance ?Bed Mobility: Supine to Sit ?Rolling: Supervision ?  ?  ?  ?  ?General bed mobility comments: pt uses edge of bed to pull self up to EOB, HOB flat,  no physical assist ?  ? ?Transfers ?Overall transfer level: Needs assistance ?Equipment used: Rolling walker (2 wheels) ?Transfers: Sit to/from Stand ?Sit to Stand: Min assist ?  ?  ?  ?  ?  ?General transfer comment: minA to power up, pt with posterior push/retropulsion and braces self on bed with back of legs, tactile and verbal cues to stay in anterior weight shift ?  ? ?Ambulation/Gait ?Ambulation/Gait assistance: Mod assist, +2 safety/equipment ?Gait Distance (Feet): 300 Feet ?Assistive device: Rolling walker (2 wheels) ?Gait Pattern/deviations: Step-to pattern, Step-through pattern, Knee hyperextension - right, Decreased dorsiflexion - right ?Gait velocity: decreased ?Gait velocity interpretation: <1.31 ft/sec, indicative of household ambulator ?  ?General Gait Details: pt given R hand guard to place on walker as pt unable to grip walker at this time and requires maxA to maintain R UE on walker. pt continues with R drop foot and weak R hip flexors resulting in difficulty clearing R foot. Pt also with difficulty keeping R LE in neutral position vs external rotation. Worked on exagerant R hip flexion to clear foot for 100', worked on keeping R foot in neutral/midline for 100', and then worked on minimizing R knee hyperextension during last 100' ? ? ?Stairs ?  ?  ?  ?  ?  ? ? ?Wheelchair Mobility ?  ? ?Modified Rankin (Stroke Patients Only) ?Modified Rankin (Stroke Patients Only) ?Pre-Morbid Rankin Score: No significant disability ?Modified Rankin: Moderately severe disability ? ? ?  ?Balance Overall balance assessment: Needs assistance ?Sitting-balance support: No upper extremity supported, Feet supported ?Sitting balance-Leahy Scale: Good ?Sitting balance - Comments: can reach outside BOS without LOB ?  ?Standing balance support: Bilateral upper extremity supported, During  functional activity, Reliant on assistive device for balance ?Standing balance-Leahy Scale: Fair ?Standing balance comment: able to set his  stance and maintain static stance without UE's or external support and min guard assists ?  ?  ?  ?  ?  ?  ?  ?  ?  ?  ?  ?  ? ?  ?Cognition Arousal/Alertness: Awake/alert ?Behavior During Therapy: [facility] for tasks assessed/performed ?Overall Cognitive Status: Impaired/Different from baseline ?Area of Impairment: Memory ?  ?  ?  ?  ?  ?  ?  ?  ?  ?  ?Memory: Decreased short-term memory ?  ?  ?  ?  ?General Comments: pt with decreased carry over of HEP and transfer technique from saturday tx session ?  ?  ? ?  ?Exercises General Exercises - Lower Extremity ?Heel Raises: AROM, Right, 10 reps, Seated ?Other Exercises ?Other Exercises: worked on sit to stands with increased weight shift to the R provided by PT and no use of pt's hands to help push up ? ?  ?General Comments General comments (skin integrity, edema, etc.): VSS ?  ?  ? ?Pertinent Vitals/Pain Pain Assessment ?Pain Assessment: No/denies pain  ? ? ?Home Living   ?  ?  ?  ?  ?  ?  ?  ?  ?  ?   ?  ?Prior Function    ?  ?  ?   ? ?PT Goals (current goals can now be found in the care plan section) Acute Rehab PT Goals ?Patient Stated Goal: Get back to [place] ?PT Goal Formulation: With patient ?Time For Goal Achievement: 01/29/22 ?Potential to Achieve Goals: Good ?Progress towards PT goals: Progressing toward goals ? ?  ?Frequency ? ? ? Min 4X/week ? ? ? ?  ?PT Plan Current plan remains appropriate  ? ? ?Co-evaluation   ?  ?  ?  ?  ? ?  ?AM-PAC PT "6 Clicks" Mobility   ?Outcome Measure ? Help needed turning from your back to your side while in a flat bed without using bedrails?: A Little ?Help needed moving from lying on your back to sitting on the side of a flat bed without using bedrails?: A Little ?Help needed moving to and from a bed to a chair (including a wheelchair)?: A Lot ?Help needed standing up from a chair using your arms (e.g., wheelchair or bedside chair)?: A Lot ?Help needed to walk in hospital room?: A Lot ?Help needed climbing 3-5 steps with a  railing? : Total ?6 Click Score: 13 ? ?  ?End of Session Equipment Utilized During Treatment: Gait belt ?Activity Tolerance: Patient tolerated treatment well ?Patient left: in chair;with chair alarm set ?Nurse Communication: Mobility status ?PT Visit Diagnosis: Other abnormalities of gait and mobility (R26.89);Hemiplegia and hemiparesis;Difficulty in walking, not elsewhere classified (R26.2);Muscle weakness (generalized) (M62.81) ?Hemiplegia - Right/Left: Right ?Hemiplegia - dominant/non-dominant: Dominant ?Hemiplegia - caused by: Cerebral infarction ?  ? ? ?Time: 9381-8299 ?PT Time Calculation (min) (ACUTE ONLY): 31 min ? ?Charges:  $Gait Training: 8-22 mins ?$Therapeutic Exercise: 8-22 mins          ?          ? ?Lewis Shock, PT, DPT ?Acute Rehabilitation Services ?Secure chat preferred ?Office #: [phone] ? ? ? ?Miasha Emmons M Sarp Vernier ?01/22/2022, 2:05 PM ? ?

## 2022-01-22 NOTE — Progress Notes (Signed)
Patient transported off the unit via recliner to [facility] for CIR admission. Handoff completed with [place].  ? ?

## 2022-01-22 NOTE — Progress Notes (Signed)
Orthopedic Tech Progress Note ?Patient Details:  ?Joseph Patrick ?[birth year] ?[id number] ? ?Called in order to [employer] for a PRAFO BOOT  ? ?Patient ID: Joseph Patrick, male   DOB: 1946-07-24, 76 y.o.   MRN: [id number] ? ?Joseph Patrick ?01/22/2022, 5:39 PM ? ?

## 2022-01-22 NOTE — Discharge Instructions (Addendum)
Inpatient Rehab Discharge Instructions  Joseph Patrick Discharge date and time: No discharge date for patient encounter.   Activities/Precautions/ Functional Status: Activity: activity as tolerated Diet: diabetic diet Wound Care: routine skin checks Functional status:  ___ No restrictions     ___ Walk up steps independently ___ 24/7 supervision/assistance   ___ Walk up steps with assistance ___ Intermittent supervision/assistance  ___ Bathe/dress independently ___ Walk with walker     _x__ Bathe/dress with assistance ___ Walk Independently    ___ Shower independently ___ Walk with assistance    ___ Shower with assistance ___ No alcohol     ___ Return to work/school ________  Special Instructions: No driving smoking or smoking   Continue aspirin and plavix  for STROKE/TIA    DISCHARGE INSTRUCTIONS SMOKING Cigarette smoking nearly doubles your risk of having a stroke & is the single most alterable risk factor  If you smoke or have smoked in the last 12 months, you are advised to quit smoking for your health. Most of the excess cardiovascular risk related to smoking disappears within a year of stopping. Ask you doctor about anti-smoking medications [place] Quit Line: 1-800-QUIT NOW Free Smoking Cessation Classes [phone]  CHOLESTEROL Know your levels; limit fat & cholesterol in your diet  Lipid Panel     Component Value Date/Time   CHOL 186 01/07/2022 0551   CHOL 194 03/28/2020 1552   TRIG 82 01/07/2022 0551   HDL 43 01/07/2022 0551   HDL 40 03/28/2020 1552   CHOLHDL 4.3 01/07/2022 0551   VLDL 16 01/07/2022 0551   LDLCALC 127 (H) 01/07/2022 0551   LDLCALC 120 (H) 03/28/2020 1552     Many patients benefit from treatment even if their cholesterol is at goal. Goal: Total Cholesterol (CHOL) less than 160 Goal:  Triglycerides (TRIG) less than 150 Goal:  HDL greater than 40 Goal:  LDL (LDLCALC) less than 100   BLOOD PRESSURE American Stroke Association blood pressure target  is less that 120/80 mm/Hg  Your discharge blood pressure is:    Monitor your blood pressure Limit your salt and alcohol intake Many individuals will require more than one medication for high blood pressure  DIABETES (A1c is a blood sugar average for last 3 months) Goal HGBA1c is under 7% (HBGA1c is blood sugar average for last 3 months)  Diabetes:   Lab Results  Component Value Date   HGBA1C 7.1 (H) 01/09/2022    Your HGBA1c can be lowered with medications, healthy diet, and exercise. Check your blood sugar as directed by your physician Call your physician if you experience unexplained or low blood sugars.  PHYSICAL ACTIVITY/REHABILITATION Goal is 30 minutes at least 4 days per week  Activity: Increase activity slowly, Therapies: Physical Therapy: Home Health Return to work:  Activity decreases your risk of heart attack and stroke and makes your heart stronger.  It helps control your weight and blood pressure; helps you relax and can improve your mood. Participate in a regular exercise program. Talk with your doctor about the best form of exercise for you (dancing, walking, swimming, cycling).  DIET/WEIGHT Goal is to maintain a healthy weight  Your discharge diet is:  Diet Order             Diet Carb Modified Fluid consistency: Thin; Room service appropriate? Yes  Diet effective now                   liquids Your height is:  Height: 5\' 9"  (175.3 cm)  Your current weight is: Weight: 74.2 kg Your Body Mass Index (BMI) is:  BMI (Calculated): 24.15 Following the type of diet specifically designed for you will help prevent another stroke. Your goal weight range is:   Your goal Body Mass Index (BMI) is 19-24. Healthy food habits can help reduce 3 risk factors for stroke:  High cholesterol, hypertension, and excess weight.  RESOURCES Stroke/Support Group:  Call [phone]   STROKE EDUCATION PROVIDED/REVIEWED AND GIVEN TO PATIENT Stroke warning signs and symptoms How to activate  emergency medical system (call 911). Medications prescribed at discharge. Need for follow-up after discharge. Personal risk factors for stroke. Pneumonia vaccine given: No Flu vaccine given: No My questions have been answered, the writing is legible, and I understand these instructions.  I will adhere to these goals & educational materials that have been provided to me after my discharge from the hospital.     three weeks then aspirin alone   COMMUNITY REFERRALS UPON DISCHARGE:    Home Health:   PT  & OT                  Agency: [facility]    Phone: [place]                                                 Agency/Supplier:[facility]  [phone]    My questions have been answered and I understand these instructions. I will adhere to these goals and the provided educational materials after my discharge from the hospital.  Patient/Caregiver Signature _______________________________ Date __________  Clinician Signature _______________________________________ Date __________  Please bring this form and your medication list with you to all your follow-up doctor's appointments.

## 2022-01-22 NOTE — Progress Notes (Signed)
Inpatient Rehabilitation Admission Medication Review by a Pharmacist ? ?A complete drug regimen review was completed for this patient to identify any potential clinically significant medication issues. ? ?High Risk Drug Classes Is patient taking? Indication by Medication  ?Antipsychotic No   ?Anticoagulant Yes Lovenox-VTE PPX  ?Antibiotic No   ?Opioid No   ?Antiplatelet Yes Aspirin/Plavix-CVA PPX  ?Hypoglycemics/insulin Yes Farxiga/SSI-DM  ?Vasoactive Medication Yes Toprol XL/Bidil-HTN ?Entresto-CHF  ?Chemotherapy No   ?Other Yes Lipitor-HLD ?Tylelon-pain/temp  ? ? ? ?Type of Medication Issue Identified Description of Issue Recommendation(s)  ?Drug Interaction(s) (clinically significant) ?    ?Duplicate Therapy ?    ?Allergy ?    ?No Medication Administration End Date ?    ?Incorrect Dose ?    ?Additional Drug Therapy Needed ?    ?Significant med changes from prior encounter (inform family/care partners about these prior to discharge). Metoprolol tartrate bid changed to Torprol-XL daily. ?Atorvastatin 80 mg daily changed to 40 mg daily.   ?Other ?    ? ? ?Clinically significant medication issues were identified that warrant physician communication and completion of prescribed/recommended actions by midnight of the next day:  No ? ?Pharmacist comments: Noted to monitor HR closely (TID) due to bradycardia. ? ?Time spent performing this drug regimen review (minutes):  10 ? ? ?Legrand Pitts, RPh ?Clinical Pharmacist ? ?01/22/2022 5:12 PM ?

## 2022-01-23 DIAGNOSIS — I679 Cerebrovascular disease, unspecified: Secondary | ICD-10-CM | POA: Diagnosis not present

## 2022-01-23 LAB — COMPREHENSIVE METABOLIC PANEL
ALT: 55 U/L — ABNORMAL HIGH (ref 0–44)
AST: 33 U/L (ref 15–41)
Albumin: 3.3 g/dL — ABNORMAL LOW (ref 3.5–5.0)
Alkaline Phosphatase: 59 U/L (ref 38–126)
Anion gap: 7 (ref 5–15)
BUN: 28 mg/dL — ABNORMAL HIGH (ref 8–23)
CO2: 24 mmol/L (ref 22–32)
Calcium: 9 mg/dL (ref 8.9–10.3)
Chloride: 107 mmol/L (ref 98–111)
Creatinine, Ser: 1.35 mg/dL — ABNORMAL HIGH (ref 0.61–1.24)
GFR, Estimated: 54 mL/min — ABNORMAL LOW (ref >60)
Glucose, Bld: 133 mg/dL — ABNORMAL HIGH (ref 70–99)
Potassium: 4 mmol/L (ref 3.5–5.1)
Sodium: 138 mmol/L (ref 135–145)
Total Bilirubin: 1.1 mg/dL (ref 0.3–1.2)
Total Protein: 6.9 g/dL (ref 6.5–8.1)

## 2022-01-23 LAB — CBC WITH DIFFERENTIAL/PLATELET
Abs Immature Granulocytes: 0.02 10*3/uL (ref 0.00–0.07)
Basophils Absolute: 0 10*3/uL (ref 0.0–0.1)
Basophils Relative: 0 %
Eosinophils Absolute: 0.1 10*3/uL (ref 0.0–0.5)
Eosinophils Relative: 2 %
HCT: 41.1 % (ref 39.0–52.0)
Hemoglobin: 13.6 g/dL (ref 13.0–17.0)
Immature Granulocytes: 0 %
Lymphocytes Relative: 29 %
Lymphs Abs: 1.4 10*3/uL (ref 0.7–4.0)
MCH: 29.8 pg (ref 26.0–34.0)
MCHC: 33.1 g/dL (ref 30.0–36.0)
MCV: 89.9 fL (ref 80.0–100.0)
Monocytes Absolute: 0.5 10*3/uL (ref 0.1–1.0)
Monocytes Relative: 10 %
Neutro Abs: 2.8 10*3/uL (ref 1.7–7.7)
Neutrophils Relative %: 59 %
Platelets: 302 10*3/uL (ref 150–400)
RBC: 4.57 MIL/uL (ref 4.22–5.81)
RDW: 12.6 % (ref 11.5–15.5)
WBC: 4.9 10*3/uL (ref 4.0–10.5)
nRBC: 0 % (ref 0.0–0.2)

## 2022-01-23 LAB — GLUCOSE, CAPILLARY
Glucose-Capillary: 157 mg/dL — ABNORMAL HIGH (ref 70–99)
Glucose-Capillary: 177 mg/dL — ABNORMAL HIGH (ref 70–99)
Glucose-Capillary: 139 mg/dL — ABNORMAL HIGH (ref 70–99)
Glucose-Capillary: 169 mg/dL — ABNORMAL HIGH (ref 70–99)

## 2022-01-23 LAB — VITAMIN D 25 HYDROXY (VIT D DEFICIENCY, FRACTURES): Vit D, 25-Hydroxy: 23.25 ng/mL — ABNORMAL LOW (ref 30–100)

## 2022-01-23 LAB — VITAMIN B12: Vitamin B-12: 367 pg/mL (ref 180–914)

## 2022-01-23 MED ORDER — VITAMIN D (ERGOCALCIFEROL) 1.25 MG (50000 UNIT) PO CAPS
50000.0000 [IU] | ORAL_CAPSULE | ORAL | Status: DC
  Administered 2022-01-23 – 2022-02-06 (×3): 50000 [IU] via ORAL
  Filled 2022-01-23 (×3): qty 1

## 2022-01-23 NOTE — Plan of Care (Signed)
?Problem: RH Balance ?Goal: LTG Patient will maintain dynamic standing with ADLs (OT) ?Description: LTG:  Patient will maintain dynamic standing balance with assist during activities of daily living (OT)  ?Flowsheets (Taken 01/23/2022 1245) ?LTG: Pt will maintain dynamic standing balance during ADLs with: Supervision/Verbal cueing ?  ?Problem: Sit to Stand ?Goal: LTG:  Patient will perform sit to stand in prep for activites of daily living with assistance level (OT) ?Description: LTG:  Patient will perform sit to stand in prep for activites of daily living with assistance level (OT) ?Flowsheets (Taken 01/23/2022 1245) ?LTG: PT will perform sit to stand in prep for activites of daily living with assistance level: Independent with assistive device ?  ?Problem: RH Grooming ?Goal: LTG Patient will perform grooming w/assist,cues/equip (OT) ?Description: LTG: Patient will perform grooming with assist, with/without cues using equipment (OT) ?Flowsheets (Taken 01/23/2022 1245) ?LTG: Pt will perform grooming with assistance level of: Independent with assistive device  ?  ?Problem: RH Bathing ?Goal: LTG Patient will bathe all body parts with assist levels (OT) ?Description: LTG: Patient will bathe all body parts with assist levels (OT) ?Flowsheets (Taken 01/23/2022 1245) ?LTG: Pt will perform bathing with assistance level/cueing: Independent with assistive device  ?  ?Problem: RH Dressing ?Goal: LTG Patient will perform upper body dressing (OT) ?Description: LTG Patient will perform upper body dressing with assist, with/without cues (OT). ?Flowsheets (Taken 01/23/2022 1245) ?LTG: Pt will perform upper body dressing with assistance level of: Independent with assistive device ?Goal: LTG Patient will perform lower body dressing w/assist (OT) ?Description: LTG: Patient will perform lower body dressing with assist, with/without cues in positioning using equipment (OT) ?Flowsheets (Taken 01/23/2022 1245) ?LTG: Pt will perform lower body  dressing with assistance level of: Supervision/Verbal cueing ?  ?Problem: RH Toileting ?Goal: LTG Patient will perform toileting task (3/3 steps) with assistance level (OT) ?Description: LTG: Patient will perform toileting task (3/3 steps) with assistance level (OT)  ?Flowsheets (Taken 01/23/2022 1245) ?LTG: Pt will perform toileting task (3/3 steps) with assistance level: Independent with assistive device ?  ?Problem: RH Functional Use of Upper Extremity ?Goal: LTG Patient will use RT/LT upper extremity as a (OT) ?Description: LTG: Patient will use right/left upper extremity as a stabilizer/gross assist/diminished/nondominant/dominant level with assist, with/without cues during functional activity (OT) ?Flowsheets (Taken 01/23/2022 1245) ?LTG: Use of upper extremity in functional activities: RUE as a stabilizer ?LTG: Pt will use upper extremity in functional activity with assistance level of: Independent with assistive device ?  ?Problem: RH Simple Meal Prep ?Goal: LTG Patient will perform simple meal prep w/assist (OT) ?Description: LTG: Patient will perform simple meal prep with assistance, with/without cues (OT). ?Flowsheets (Taken 01/23/2022 1245) ?LTG: Pt will perform simple meal prep with assistance level of: Independent with assistive device ?LTG: Pt will perform simple meal prep w/level of: Wheelchair level ?  ?Problem: RH Light Housekeeping ?Goal: LTG Patient will perform light housekeeping w/assist (OT) ?Description: LTG: Patient will perform light housekeeping with assistance, with/without cues (OT). ?Flowsheets (Taken 01/23/2022 1245) ?LTG: Pt will perform light housekeeping with assistance level of: Independent with assistive device ?LTG: Pt will perform light housekeeping w/level of: Wheelchair level ?  ?Problem: RH Toilet Transfers ?Goal: LTG Patient will perform toilet transfers w/assist (OT) ?Description: LTG: Patient will perform toilet transfers with assist, with/without cues using equipment  (OT) ?Flowsheets (Taken 01/23/2022 1245) ?LTG: Pt will perform toilet transfers with assistance level of: Supervision/Verbal cueing ?  ?Problem: RH Tub/Shower Transfers ?Goal: LTG Patient will perform tub/shower transfers w/assist (OT) ?  Description: LTG: Patient will perform tub/shower transfers with assist, with/without cues using equipment (OT) ?Flowsheets (Taken 01/23/2022 1245) ?LTG: Pt will perform tub/shower stall transfers with assistance level of: Supervision/Verbal cueing ?  ?Problem: RH Furniture Transfers ?Goal: LTG Patient will perform furniture transfers w/assist (OT/PT) ?Description: LTG: Patient will perform furniture transfers  with assistance (OT/PT). ?Flowsheets (Taken 01/23/2022 1245) ?LTG: Pt will perform furniture transfers with assist:: Supervision/Verbal cueing ?  ?Problem: RH Awareness ?Goal: LTG: Patient will demonstrate awareness during functional activites type of (OT) ?Description: LTG: Patient will demonstrate awareness during functional activites type of (OT) ?Flowsheets (Taken 01/23/2022 1245) ?Patient will demonstrate awareness during functional activites type of: Anticipatory ?LTG: Patient will demonstrate awareness during functional activites type of (OT): Supervision ?  ?

## 2022-01-23 NOTE — Plan of Care (Signed)
?  Problem: Consults ?Goal: RH STROKE PATIENT EDUCATION ?Description: See Patient Education module for education specifics  ?Outcome: Progressing ?  ?Problem: RH BOWEL ELIMINATION ?Goal: RH STG MANAGE BOWEL WITH ASSISTANCE ?Description: STG Manage Bowel with mod I Assistance. ?Outcome: Progressing ?Goal: RH STG MANAGE BOWEL W/MEDICATION W/ASSISTANCE ?Description: STG Manage Bowel with Medication with mod I Assistance. ?Outcome: Progressing ?  ?Problem: RH SAFETY ?Goal: RH STG ADHERE TO SAFETY PRECAUTIONS W/ASSISTANCE/DEVICE ?Description: STG Adhere to Safety Precautions With cues Assistance/Device. ?Outcome: Progressing ?  ?Problem: RH KNOWLEDGE DEFICIT ?Goal: RH STG INCREASE KNOWLEDGE OF DIABETES ?Description: Patient and niece will be able to manage DM with medications and dietary modifications using handouts and educational resources independently ?Outcome: Progressing ?Goal: RH STG INCREASE KNOWLEDGE OF HYPERTENSION ?Description: Patient and niece will be able to manage HTN with medications and dietary modifications using handouts and educational resources independently ?Outcome: Progressing ?Goal: RH STG INCREASE KNOWLEGDE OF HYPERLIPIDEMIA ?Description: Patient and niece will be able to manage HLD with medications and dietary modifications using handouts and educational resources independently ?Outcome: Progressing ?Goal: RH STG INCREASE KNOWLEDGE OF STROKE PROPHYLAXIS ?Description: Patient and niece will be able to manage secondary stroke risks ? with medications and dietary modifications using handouts and educational resources independently ?Outcome: Progressing ?  ?Problem: Education: ?Goal: Ability to demonstrate management of disease process will improve ?Outcome: Progressing ?Goal: Ability to verbalize understanding of medication therapies will improve ?Outcome: Progressing ?Goal: Individualized Educational Video(s) ?Outcome: Progressing ?  ?

## 2022-01-23 NOTE — Progress Notes (Signed)
Inpatient Rehabilitation Care Coordinator ?Assessment and Plan ?Patient Details  ?Name: Joseph Patrick ?MRN: [id number] ?Date of Birth: 26-Oct-1945 ? ?Today's Date: 01/23/2022 ? ?Hospital Problems: Principal Problem: ?  Small vessel disease, cerebrovascular ? ?Past Medical History:  ?Past Medical History:  ?Diagnosis Date  ? CVA (cerebral vascular accident) [facility])   ? april 2023  ? Diabetes mellitus without complication (HCC)   ? Hypertension   ? ?Past Surgical History: History reviewed. No pertinent surgical history. ?Social History:  reports that he has never smoked. He has never used smokeless tobacco. He reports that he does not drink alcohol and does not use drugs. ? ?Family / Support Systems ?Marital Status: Single ?Patient Roles: Other (Comment) (sibling and uncle) ?Other Supports: Angelia-niece [phone] and sister who lives down the hall from him ?Anticipated Caregiver: Sister and other family members ?Ability/Limitations of Caregiver: Supervision hopefully intermittent pt is very independent ?Caregiver Availability: 24/7 ?Family Dynamics: Close knit family pt has his sister and niece and other extended family here. Sister lives down the hall from him in the same apartment complex. ? ?Social History ?Preferred language: English ?Religion: None ?Cultural Background: No issues ?Education: HS ?Health Literacy - How often do you need to have someone help you when you read instructions, pamphlets, or other written material from your doctor or pharmacy?: Never ?Writes: Yes ?Employment Status: Retired ?Age Retired: 65 ?Legal History/Current Legal Issues: No issues ?Guardian/Conservator: Pt does have a HCPOA now did last admission. MD feels pt is capable of making his own decisions while here  ? ?Abuse/Neglect ?Abuse/Neglect Assessment Can Be Completed: Yes ?Physical Abuse: Denies ?Verbal Abuse: Denies ?Sexual Abuse: Denies ?Exploitation of patient/patient's resources: Denies ?Self-Neglect: Denies ? ?Patient response  to: ?Social Isolation - How often do you feel lonely or isolated from those around you?: Never ? ?Emotional Status ?Pt's affect, behavior and adjustment status: Pt is motivated to do well and regain his independence. He was just in the hospital and should have come back sooner but did not with this stroke extension. He has always been independent and taken care of himself.  Is very active plays golf several times per week ?Recent Psychosocial Issues: other health issues-recent hospitaliztion 4/26 after CVA ?Psychiatric History: No history seems to be coping appropriately and verbalizing his feelings. He plans to recover and resume his retirement and activities ?Substance Abuse History: No issues ? ?Patient / Family Perceptions, Expectations & Goals ?Pt/Family understanding of illness & functional limitations: Pt is able to explain this stroke and feels should have come back to the hospital sooner did call 911 and stayed at home due to EMT expressed busy night. Does talk with the MD and feels understands his process moving forward. ?Premorbid pt/family roles/activities: brother, uncle, retiree, golfer, etc ?Anticipated changes in roles/activities/participation: resume ?Pt/family expectations/goals: Pt states: " I hope to be independent like I was when I left before, I will use a cane or walker." ? ?Community Resources ?Community Agencies: None ?Premorbid Home Care/DME Agencies: None ?Transportation available at discharge: self niece can assist with if needed ?Is the patient able to respond to transportation needs?: Yes ?In the past 12 months, has lack of transportation kept you from medical appointments or from getting medications?: No ?In the past 12 months, has lack of transportation kept you from meetings, work, or from getting things needed for daily living?: No ? ?Discharge Planning ?Living Arrangements: Alone ?Support Systems: Other relatives, Church/faith community ?Type of Residence: Private  residence ?Insurance Resources: Media planner (specify) ([employer] Medicare) ?Financial Resources: Social  Security ?Financial Screen Referred: No ?Living Expenses: Rent ?Money Management: Patient ?Does the patient have any problems obtaining your medications?: No ?Home Management: self ?Patient/Family Preliminary Plans: Return back to his apartment hopefully independently. His sister lives down the hall and he is back and forth daily to see her anyway. He feels he can stay alone and manage even right now. ?Care Coordinator Barriers to Discharge: Insurance for SNF coverage, Decreased caregiver support ?Care Coordinator Anticipated Follow Up Needs: HH/OP ? ?Clinical Impression ?Pleasant very active gentleman who wants to resume his golfing, he does several times per week. His sister is won the hall from him and his niece come by daily to check on them both. Will await team's evaluations  and work on discharge needs. ? ?Lucy Chris ?01/23/2022, 10:10 AM ? ?  ?

## 2022-01-23 NOTE — Progress Notes (Signed)
Orthopedic Tech Progress Note ?Patient Details:  ?Joseph Patrick ?1946-07-18 ?[id number] ? ?Reached out to MD RAULKAR to see if she wants patient to have an AFO or a PRAFO BOOT which is in room, no answer  ? ?Patient ID: Joseph Patrick, male   DOB: May 24, 1946, 76 y.o.   MRN: [id number] ? ?Joseph Patrick ?01/23/2022, 11:13 AM ? ?

## 2022-01-23 NOTE — Progress Notes (Signed)
Inpatient Rehabilitation  Patient information reviewed and entered into eRehab system by Jahnasia Tatum Esiquio Boesen, OTR/L.   Information including medical coding, functional ability and quality indicators will be reviewed and updated through discharge.    

## 2022-01-23 NOTE — Progress Notes (Signed)
PMR Admission Coordinator Pre-Admission Assessment ?  ?Patient: Joseph Patrick is an 76 y.o., male ?MRN: [id number] ?DOB: March 07, 1946 ?Height: $RemoveBefor'5\' 8"'[id number]$  (172.7 cm) ?Weight: 76.5 kg ?  ?Insurance Information ?HMO: yes    PPO:      PCP:      IPA:      80/20:      OTHER:  ?PRIMARY: [employer] Medicare      Policy#: [id number]      Subscriber: pt ?CM Name: [place]      Phone#: [phone] (expedited appeals dept)    Fax#: [phone] ?Pre-Cert#: [id number] Josem Kaufmann for CIR from [place] in the expedited appeals dept with updates due to fax listed above on 5/13.        Employer:  ?Benefits:  Phone #: [phone]     Name:  ?Eff. Date: 09/17/21     Deduct: $0      Out of Pocket Max: $3400 ($0 met)      Life Max: na/ ?CIR: $295/day for days 1-6      SNF: 20 full days ?Outpatient:      Co-Pay: $10-$20/visit ?Home Health: 100%      Co-Pay:  ?DME: 80%     Co-ins: 20% ?Providers:  ?SECONDARY:       Policy#:      Phone#:  ?  ?Financial Counselor:       Phone#:  ?  ?The ?Data Collection Information Summary? for patients in Inpatient Rehabilitation Facilities with attached ?Privacy Act [place] Records? was provided and verbally reviewed with: Patient and Family ?  ?Emergency Contact Information ?Contact Information   ?  ?  Name Relation Home Work Mobile  ?  Joseph Patrick,Joseph Patrick Niece     [phone]  ?  ?   ?  ?  ?Current Medical History  ?Patient Admitting Diagnosis: L MCA CVA ?  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  Presented 01/14/2022 with increasing right-sided weakness as well as  stuttering speech.  CT/MRI showed acute infarct involving the left corona radiata, caudate body, and posterior lentiform nucleus.  Recent echocardiogram with ejection fraction of 25 to 30% left ventricle demonstrated global hypokinesis grade 2 diastolic dysfunction.  Admission chemistries unremarkable aside glucose 136 creatinine 1.43, urine drug screen negative.  Neurology follow-up patient currently remains on low-dose aspirin and Plavix.  Lovenox for DVT prophylaxis.  Tolerating a regular consistency diet.  Therapy evaluations completed due to patient decreased functional mobility right side weakness was recommended for a comprehensive rehab program. ?  ?Complete NIHSS TOTAL: 8 ?  ?Patient's medical record from Joseph Patrick has been reviewed by the rehabilitation admission coordinator and physician. ?  ?Past Medical History  ?    ?Past Medical History:  ?Diagnosis Date  ? CVA (cerebral vascular accident) [facility])    ?  april 2023  ? Diabetes mellitus without complication ([place])    ? Hypertension    ?  ?  ?Has the patient had major surgery during 100 days prior to admission? No ?  ?Family History   ?family history includes Heart failure in his sister. ?  ?Current Medications ?  ?Current Facility-Administered Medications:  ?  acetaminophen (TYLENOL)  tablet 650 mg, 650 mg, Oral, Q4H PRN **OR** acetaminophen (TYLENOL) 160 MG/5ML solution 650 mg, 650 mg, Per Tube, Q4H PRN **OR** acetaminophen (TYLENOL) suppository 650 mg, 650 mg, Rectal, Q4H PRN, Lacinda Axon, MD ?  aspirin EC tablet 81 mg, 81 mg, Oral, Daily, Masters, Katie, DO, 81 mg at 01/16/22 0956 ?  atorvastatin (LIPITOR) tablet 80 mg, 80 mg, Oral, Daily, Masters, Katie, DO, 80 mg at 01/16/22 0956 ?  clopidogrel (PLAVIX) tablet 75 mg, 75 mg, Oral, Daily, [name], MD, 75 mg at 01/16/22 0956 ?  dapagliflozin propanediol (FARXIGA) tablet 5 mg, 5 mg, Oral, QAC breakfast, Lacinda Axon, MD, 5 mg at 01/16/22 0757 ?  enoxaparin (LOVENOX) injection 40 mg, 40  mg, Subcutaneous, Q24H, Lacinda Axon, MD, 40 mg at 01/16/22 1545 ?  insulin aspart (novoLOG) injection 0-15 Units, 0-15 Units, Subcutaneous, TID WC, Masters, Katie, DO, 2 Units at 01/16/22 1733 ?  isosorbide-hydrALAZINE (BIDIL) 20-37.5 MG per tablet 1 tablet, 1 tablet, Oral, TID, [name], MD, 1 tablet at 01/16/22 2108 ?  metoprolol succinate (TOPROL-XL) 24 hr tablet 100 mg, 100 mg, Oral, Daily, [name], MD ?  sacubitril-valsartan (ENTRESTO) 24-26 mg per tablet, 1 tablet, Oral, BID, Lacinda Axon, MD, 1 tablet at 01/16/22 2109 ?  senna-docusate (Senokot-S) tablet 1 tablet, 1 tablet, Oral, QHS PRN, Lacinda Axon, MD ?  ?Patients Current Diet:  ?Diet Order   ?  ?         ?    Diet Carb Modified Fluid consistency: Thin; Room service appropriate? Yes  Diet effective now       ?  ?  ?   ?  ?  ?   ?  ?  ?Precautions / Restrictions ?Precautions ?Precautions: Fall ?Restrictions ?Weight Bearing Restrictions: No  ?  ?Has the patient had 2 or more falls or a fall with injury in the past year? No ?  ?Prior Activity Level ?Limited Community (1-2x/wk): driving some, mod I with SPC veresus RW depending on day, mod i with ADLs ?  ?Prior Functional Level ?Self Care: Did the patient need help bathing, dressing, using the toilet or eating? Independent ?  ?Indoor Mobility: Did the patient need assistance with walking from room to room (with or without device)? Independent ?  ?Stairs: Did the patient need assistance with internal or external stairs (with or without device)? Independent ?  ?Functional Cognition: Did the patient need help planning regular tasks such as shopping or remembering to take medications? Independent ?  ?Patient Information ?Are you of Hispanic, Latino/a,or Spanish origin?: A. No, not of Hispanic, Latino/a, or Spanish origin ?What is your race?: B. Black or African American ?Do you need or want an interpreter to communicate with a doctor or health care staff?: 0. No ?  ?Patient's  Response To:  ?Health Literacy and Transportation ?Is the patient able to respond to health literacy and transportation needs?: Yes ?Health Literacy - How often do you need to have someone help you when you read instructions, pamphlets, or other written material from your doctor or pharmacy?: Never ?In the past 12 months, has lack of transportation kept you from medical appointments or from getting medications?: No ?In the past 12 months, has lack of transportation kept you from meetings, work, or from getting things needed for daily living?: No ?  ?Home Assistive Devices / Equipment ?Home Assistive Devices/Equipment: Cane (specify quad or straight) ?Home Equipment: None ?  ?Prior Device Use: Indicate devices/aids used by the  patient prior to current illness, exacerbation or injury? Walker and SPC ?  ?Current Functional Level ?Cognition ?  Overall Cognitive Status: No family/caregiver present to determine baseline cognitive functioning ?Orientation Level: Oriented X4 ?General Comments: pt with no recollection of BEFAST despite PT going over it with pt prior to OT session, will further assess ?   ?Extremity Assessment ?(includes Sensation/Coordination) ?  Upper Extremity Assessment: RUE deficits/detail ?RUE Deficits / Details: R shoulder hiking with flexion, decreased grasp strength and ability to extend fingers in R hand. ?RUE Coordination: decreased fine motor, decreased gross motor  ?Lower Extremity Assessment: Defer to PT evaluation ?RLE Deficits / Details: quads/gross extensors 3/5, hams 3-, df 1/5, pf 2/5, hip flexors 2/5, movement in synergy, numbness from shin down ?RLE Sensation: decreased light touch ?RLE Coordination: decreased fine motor ?LLE Deficits / Details: WFL  ?   ?ADLs ?  Overall ADL's : Needs assistance/impaired ?Eating/Feeding: Set up, Sitting ?Grooming: Set up, Sitting ?Upper Body Bathing: Minimal assistance, Sitting ?Lower Body Bathing: Moderate assistance, Sitting/lateral leans ?Upper Body  Dressing : Minimal assistance, Sitting, Cueing for compensatory techniques ?Lower Body Dressing: Moderate assistance, Sitting/lateral leans ?Toilet Transfer: Moderate assistance, BSC/3in1, Stand-pivot, Rolling w

## 2022-01-23 NOTE — Progress Notes (Signed)
Inpatient Rehabilitation Center ?Individual Statement of Services ? ?Patient Name:  Joseph Patrick  ?Date:  01/23/2022 ? ?Welcome to the [facility].  Our goal is to provide you with an individualized program based on your diagnosis and situation, designed to meet your specific needs.  With this comprehensive rehabilitation program, you will be expected to participate in at least 3 hours of rehabilitation therapies Monday-Friday, with modified therapy programming on the weekends. ? ?Your rehabilitation program will include the following services:  Physical Therapy (PT), Occupational Therapy (OT), Speech Therapy (ST), 24 hour per day rehabilitation nursing, Therapeutic Recreaction (TR), Care Coordinator, Rehabilitation Medicine, Nutrition Services, and Pharmacy Services ? ?Weekly team conferences will be held on Wednesday to discuss your progress.  Your Inpatient Rehabilitation Care Coordinator will talk with you frequently to get your input and to update you on team discussions.  Team conferences with you and your family in attendance may also be held. ? ?Expected length of stay: 10-14 days  Overall anticipated outcome: independent-supervision level ? ?Depending on your progress and recovery, your program may change. Your Inpatient Rehabilitation Care Coordinator will coordinate services and will keep you informed of any changes. Your Inpatient Rehabilitation Care Coordinator's name and contact numbers are listed  below. ? ?The following services may also be recommended but are not provided by the [facility]:  ?Driving Evaluations ?Home Health Rehabiltiation Services ?Outpatient Rehabilitation Services ? ?  ?Arrangements will be made to provide these services after discharge if needed.  Arrangements include referral to agencies that provide these services. ? ?Your insurance has been verified to be:  [employer] ?Your primary doctor is:  Maudie Mercury nguyen ? ?Pertinent information  will be shared with your doctor and your insurance company. ? ?Inpatient Rehabilitation Care Coordinator:  Ovidio Kin, [place] or (C) [phone] ? ?Information discussed with and copy given to patient by: Elease Hashimoto, 01/23/2022, 10:11 AM    ?

## 2022-01-23 NOTE — Evaluation (Signed)
Speech Language Pathology Assessment and Plan ? ?Patient Details  ?Name: Joseph Patrick ?MRN: [id number] ?Date of Birth: 01/22/1946 ? ?SLP Diagnosis: Cognitive Impairments  ?Rehab Potential: Excellent ?ELOS:  1-2 weeks ? ? ?Today's Date: 01/23/2022 ?SLP Individual Time: 1000-1100 ?SLP Individual Time Calculation (min): 60 min ? ? ?Hospital Problem: Principal Problem: ?  Small vessel disease, cerebrovascular ? ?Past Medical History:  ?Past Medical History:  ?Diagnosis Date  ? CVA (cerebral vascular accident) [facility])   ? april 2023  ? Diabetes mellitus without complication ([place])   ? Hypertension   ? ?Past Surgical History: History reviewed. No pertinent surgical history. ? ?Assessment / Plan / Recommendation ?Clinical Impression HPI: Patient is a 76 y.o. year old male  with history of hypertension, hyperlipidemia, diabetes mellitus, CKD stage III, medical noncompliance, recent CVA left hemispheric subcortical with admission 01/06/2022 - 01/10/2022 and placed on low-dose aspirin and Plavix x3 weeks then aspirin alone complicated by hypertension initially on Norvasc and Entresto initiated after findings of chronic congestive heart failure with ejection fraction of 25 to 30%.  Per chart review lives alone.  1 level apartment with elevator.  Independent prior to recent CVA.  Presented 01/14/2022 with increasing right-sided weakness as well as stuttering speech.  CT/MRI showed acute infarct involving the left corona radiata, caudate body, and posterior lentiform nucleus.  Recent echocardiogram with ejection fraction of 25 to 30% left ventricle demonstrated global hypokinesis grade 2 diastolic dysfunction.  Admission chemistries unremarkable aside glucose 136 creatinine 1.43, urine drug screen negative.  Neurology follow-up patient currently remains on low-dose aspirin and Plavix.  Lovenox for DVT prophylaxis.  Tolerating a regular consistency diet.  Therapy evaluations completed due to patient decreased functional mobility right  side weakness was admitted for a comprehensive rehab program. Has been noticing improved right sided strength. .  Patient transferred to CIR on 01/22/2022. ? ?SLP consulted to complete motor speech and cognitive-linguistic evaluation in the setting of acute CVA. Per formal screening and skilled, informal observation, pt presents with cognitive-linguistic skills grossly WFL, with the exception of delayed recall of word list on COGNISTAT memory subtest. Pt exhibited functional recall of basic information re: events leading up to CVA and basic activities completed this morning. Oriented x 4. Speech was fluent and intelligible at the conversational level. Pt did present with impulsivity and appeared to want to stand without assistance towards the end of today's session when he wanted to get back to bed; therefore, SLP assisted pt back to bed. Suspect baseline impulsivity and PLOF may be compounding pt's safety awareness. Pt reports he feels he is at his cognitive baseline and that he had memory deficits prior to admission, as well as impulsivity with motor movements and speech. Per d/w pt's niece (pt gave permission), pt appears to have moments of difficulty with recall, which is likely an subtle exacerbation of baseline function, and does endorse baseline impulsivity. Also states pt has demonstrated 2 instance of incoherent speech, when stressed; not observed this date. Prior to admission, pt was independent, living alone, and completing iADL's with supervision from niece. Completed 9th grade and is now retired. Brief swallow screen of thin liquid consumption unremarkable; OME WFL.  ? ?Given assessment findings, high PLOF, and family reports, ST would likely be beneficial for education on current deficits, to build emergent + anticipatory awareness, and compensatory memory strategies. Results and recommendations were reviewed. Please see below for additional findings and short-term goals. May also benefit from RT consult  for stress management. ?  ?Skilled Therapeutic  Interventions          COGNISTAT and informal assessment measures.  ?SLP Assessment ? Patient will need skilled Speech Lanaguage Pathology Services during CIR admission  ?  ?Recommendations ? SLP Diet Recommendations: Age appropriate regular solids;Thin ?Liquid Administration via: Cup;Straw ?Medication Administration: Whole meds with liquid ?Supervision: Patient able to self feed ?Compensations: Minimize environmental distractions;Slow rate;Small sips/bites ?Postural Changes and/or Swallow Maneuvers: Seated upright 90 degrees ?Oral Care Recommendations: Oral care BID ?Recommendations for Other Services: Therapeutic Recreation consult ?Therapeutic Recreation Interventions: Stress management ?Patient destination: Home ?Follow up Recommendations: None (intermittent supervision for iADL's) ?Equipment Recommended: None recommended by SLP  ?  ?SLP Frequency 1 to 3 out of 7 days   ?SLP Duration ? ?SLP Intensity ? ?SLP Treatment/Interventions   ? ?Minumum of 1-2 x/day, 30 to 90 minutes ? ?Patient/family education;Internal/external aids;Functional tasks   ? ?Pain ?Pain Assessment ?Pain Scale: 0-10 ?Pain Score: 0-No pain ? ?Prior Functioning ?Cognitive/Linguistic Baseline: Within functional limits ?Type of Home: Apartment ? Lives With: Alone ?Available Help at Discharge: Available PRN/intermittently ?Education: 9th grade ?Vocation: Retired ? ?SLP Evaluation ?Cognition ?Overall Cognitive Status: Within Functional Limits for tasks assessed ?Arousal/Alertness: Awake/alert ?Orientation Level: Oriented X4 ?Year: 2023 ?Month: May ?Day of Week: Correct ?Attention: Sustained (COGNISTAT attention subtest score: 7/8 - WFL) ?Sustained Attention: Appears intact ?Memory: Impaired (COGNISTAT delayed recall subtest score: 5/12 - moderate to severe impairment) ?Awareness: Impaired ?Awareness Impairment: Emergent impairment (safety awareness deficits, pt reporting wanting to practice walking in  room by himself) ?Problem Solving:  (COGNISTAT mental math calculation subtest score - 3/4 - WFL; verbal abstract reasoning subtest score - 5/8 - WFL; verbal judgment subtest score- 6/6 - WFL) ?Behaviors: Impulsive ?Safety/Judgment: Appears intact ?Comments: Pt presents with rapid speaking rate, mildly tagential, and slightly impulsive - states this to be baseline which pt's niece does endorse to be true ?Comprehension ?Auditory Comprehension ?Overall Auditory Comprehension: Appears within functional limits for tasks assessed ? ?Expression ?Expression ?Primary Mode of Expression: Verbal ?Verbal Expression ?Overall Verbal Expression: Appears within functional limits for tasks assessed ?Written Expression ?Dominant Hand: Right ? ?Oral Motor ?Oral Motor/Sensory Function ?Overall Oral Motor/Sensory Function: Within functional limits ?Motor Speech ?Overall Motor Speech: Appears within functional limits for tasks assessed ?Motor Planning: Witnin functional limits ? ?Care Tool ?Care Tool Cognition ?Ability to hear (with hearing aid or hearing appliances if normally used Ability to hear (with hearing aid or hearing appliances if normally used): 0. Adequate - no difficulty in normal conservation, social interaction, listening to TV ?  ?Expression of Ideas and Wants Expression of Ideas and Wants: 4. Without difficulty (complex and basic) - expresses complex messages without difficulty and with speech that is clear and easy to understand ?  ?Understanding Verbal and Non-Verbal Content Understanding Verbal and Non-Verbal Content: 4. Understands (complex and basic) - clear comprehension without cues or repetitions  ?Memory/Recall Ability Memory/Recall Ability : Current season;Location of own room;Staff names and faces;That he or she is in a hospital/hospital unit  ? ? ?Bedside Swallowing Assessment ?N/A - tolerating regular diet textures with thin liquids during acute admission. ? ?Short Term Goals: ?Week 1: SLP Short Term Goal  1 (Week 1): Pt will participate in education and recall 2 out of 4 compensatory memory strategies with Mod I. ?SLP Short Term Goal 2 (Week 1): Pt will complete additional iADL assessment tasks to further

## 2022-01-23 NOTE — Evaluation (Signed)
Occupational Therapy Assessment and Plan ? ?Patient Details  ?Name: Joseph Patrick ?MRN: [id number] ?Date of Birth: 1946-01-20 ? ?OT Diagnosis: cognitive deficits, hemiplegia affecting dominant side, and muscle weakness (generalized) ?Rehab Potential: Rehab Potential (ACUTE ONLY): Good ?ELOS: 16-19 days  ? ?Today's Date: 01/23/2022 ?OT Individual Time: [id number] ?OT Individual Time Calculation (min): 55 min    ? ?Hospital Problem: Principal Problem: ?  Small vessel disease, cerebrovascular ? ? ?Past Medical History:  ?Past Medical History:  ?Diagnosis Date  ? CVA (cerebral vascular accident) [facility])   ? april 2023  ? Diabetes mellitus without complication ([place])   ? Hypertension   ? ?Past Surgical History: History reviewed. No pertinent surgical history. ? ?Assessment & Plan ?Clinical Impression: Patient is a 76 y.o. year old male  with history of hypertension, hyperlipidemia, diabetes mellitus, CKD stage III, medical noncompliance, recent CVA left hemispheric subcortical with admission 01/06/2022 - 01/10/2022 and placed on low-dose aspirin and Plavix x3 weeks then aspirin alone complicated by hypertension initially on Norvasc and Entresto initiated after findings of chronic congestive heart failure with ejection fraction of 25 to 30%.  Per chart review lives alone.  1 level apartment with elevator.  Independent prior to recent CVA.  Presented 01/14/2022 with increasing right-sided weakness as well as stuttering speech.  CT/MRI showed acute infarct involving the left corona radiata, caudate body, and posterior lentiform nucleus.  Recent echocardiogram with ejection fraction of 25 to 30% left ventricle demonstrated global hypokinesis grade 2 diastolic dysfunction.  Admission chemistries unremarkable aside glucose 136 creatinine 1.43, urine drug screen negative.  Neurology follow-up patient currently remains on low-dose aspirin and Plavix.  Lovenox for DVT prophylaxis.  Tolerating a regular consistency diet.  Therapy evaluations  completed due to patient decreased functional mobility right side weakness was admitted for a comprehensive rehab program. Has been noticing improved right sided strength. .  Patient transferred to CIR on 01/22/2022 .   ? ?Patient currently requires min with basic self-care skills secondary to muscle weakness, impaired timing and sequencing, abnormal tone, unbalanced muscle activation, and decreased coordination, decreased awareness, and decreased standing balance, decreased postural control, hemiplegia, and decreased balance strategies.  Prior to hospitalization, patient could complete BADL/IADL/mobility with independent . ? ?Patient will benefit from skilled intervention to decrease level of assist with basic self-care skills, increase independence with basic self-care skills, and increase level of independence with iADL prior to discharge home independently.  Anticipate patient will require intermittent supervision and follow up home health and follow up outpatient. ? ?OT - End of Session ?Activity Tolerance: Tolerates 30+ min activity with multiple rests ?Endurance Deficit: Yes ?Endurance Deficit Description: Brief seated rest breaks for recovery b/w functional mobility tasks ?OT Assessment ?Rehab Potential (ACUTE ONLY): Good ?OT Barriers to Discharge: Decreased caregiver support;Inaccessible home environment;Home environment access/layout ?OT Patient demonstrates impairments in the following area(s): Balance;Cognition;Endurance;Motor;Safety ?OT Basic ADL's Functional Problem(s): Grooming;Bathing;Dressing;Toileting;Eating ?OT Advanced ADL's Functional Problem(s): Simple Meal Preparation;Light Housekeeping ?OT Transfers Functional Problem(s): Toilet;Tub/Shower ?OT Additional Impairment(s): Fuctional Use of Upper Extremity ?OT Plan ?OT Intensity: Minimum of 1-2 x/day, 45 to 90 minutes ?OT Frequency: 5 out of 7 days ?OT Duration/Estimated Length of Stay: 16-19 days ?OT Treatment/Interventions: Balance/vestibular  training;Disease mangement/prevention;Neuromuscular re-education;Self Care/advanced ADL retraining;Therapeutic Exercise;Wheelchair propulsion/positioning;UE/LE Strength taining/ROM;Skin care/wound managment;Pain management;DME/adaptive equipment instruction;Cognitive remediation/compensation;Community reintegration;Functional electrical stimulation;Patient/family education;Splinting/orthotics;UE/LE Coordination activities;Therapeutic Activities;Psychosocial support;Functional mobility training;Discharge planning ?OT Self Feeding Anticipated Outcome(s): mod I ?OT Basic Self-Care Anticipated Outcome(s): mod I to S ?OT Toileting Anticipated Outcome(s): mod I ?OT Bathroom Transfers Anticipated Outcome(s): S ?  OT Recommendation ?Recommendations for Other Services: Therapeutic Recreation consult ?Therapeutic Recreation Interventions: Pet therapy;Kitchen group;Stress management ?Patient destination: Home ?Follow Up Recommendations: Outpatient OT ?Equipment Recommended: To be determined ? ? ?OT Evaluation ?Precautions/Restrictions  ?Precautions ?Precautions: Fall ?Precaution Comments: R hemiparesis and foot drop ?Restrictions ?Weight Bearing Restrictions: No ?General ?Chart Reviewed: Yes ?Response to Previous Treatment: Not applicable ?Family/Caregiver Present: No ?Vital Signs ?Therapy Vitals ?Temp: 98 ?F (36.7 ?C) ?Pulse Rate: 63 ?Resp: 18 ?BP: 119/60 ?Patient Position (if appropriate): Sitting ?Oxygen Therapy ?SpO2: 99 % ?O2 Device: Room Air ?Pain ?Pain Assessment ?Pain Scale: 0-10 ?Pain Score: 0-No pain ?Home Living/Prior Functioning ?Home Living ?Family/patient expects to be discharged to:: Private residence ?Living Arrangements: Alone ?Available Help at Discharge: Available PRN/intermittently ?Type of Home: Apartment ?Home Access: Elevator ?Home Layout: One level ?Bathroom Shower/Tub: Walk-in shower ? Lives With: Alone ?IADL History ?Homemaking Responsibilities: Yes ?Meal Prep Responsibility: Primary ?Laundry  Responsibility: Primary ?Cleaning Responsibility: Primary ?Bill Paying/Finance Responsibility: Primary ?Shopping Responsibility: Primary ?Education: 9th grade ?Occupation: Retired ?Type of Occupation: commercial building ?Leisure and Hobbies: golf ?Prior Function ?Level of Independence: Independent with basic ADLs ?Driving: Yes ?Vocation: Retired ?Vision ?Baseline Vision/History: 1 Wears glasses (readers) ?Ability to See in Adequate Light: 0 Adequate ?Patient Visual Report: No change from baseline ?Vision Assessment?: No apparent visual deficits ?Perception  ?Perception: Within Functional Limits ?Praxis ?Praxis: Intact ?Cognition ?Cognition ?Overall Cognitive Status: Within Functional Limits for tasks assessed ?Arousal/Alertness: Awake/alert ?Orientation Level: Person;Place;Situation ?Person: Oriented ?Place: Oriented ?Situation: Oriented ?Memory: Impaired ?Attention: Sustained (7/8 - WFL) ?Sustained Attention: Appears intact ?Awareness: Impaired ?Awareness Impairment: Emergent impairment (safety awareness deficits, pt reporting wanting to practice walking in room by himself) ?Problem Solving:  (self-correct - 3/4 - WFL - math calculation. abstract - 5/8, verbal judgment - 6/6 - WFL,) ?Behaviors: Impulsive ?Safety/Judgment: Appears intact ?Comments: Pt presents with rapid speaking rate, mildly tagential, and slightly impulsive - states this to be baseline ?Brief Interview for Mental Status (BIMS) ?Repetition of Three Words (First Attempt): 3 ?Temporal Orientation: Year: Correct ?Temporal Orientation: Month: Accurate within 5 days ?Temporal Orientation: Day: Correct ?Recall: "Sock": Yes, no cue required ?Recall: "Blue": Yes, no cue required ?Recall: "Bed": Yes, no cue required ?BIMS Summary Score: 15 ?Sensation ?Sensation ?Light Touch: Appears Intact ?Hot/Cold: Appears Intact ?Proprioception: Appears Intact ?Stereognosis: Impaired by gross assessment ?Additional Comments: reports numbness in R foot but able to detect  LT to digits, RUE>RLE hemiparesis ?Coordination ?Gross Motor Movements are Fluid and Coordinated: No ?Fine Motor Movements are Fluid and Coordinated: No ?Coordination and Movement Description: Impacted by R hemi

## 2022-01-23 NOTE — Plan of Care (Signed)
?  Problem: RH Memory ?Goal: LTG Patient will use memory compensatory aids to (SLP) ?Description: LTG:  Patient will use memory compensatory aids to recall biographical/new, daily complex information with cues (SLP) ?Flowsheets (Taken 01/23/2022 2141) ?LTG: Patient will use memory compensatory aids to (SLP): Modified Independent ?  ?Problem: RH Awareness ?Goal: LTG: Patient will demonstrate awareness during functional activites type of (SLP) ?Description: LTG: Patient will demonstrate awareness during functional activites type of (SLP) ?Flowsheets (Taken 01/23/2022 2141) ?Patient will demonstrate during cognitive/linguistic activities awareness type of: Emergent ?LTG: Patient will demonstrate awareness during cognitive/linguistic activities with assistance of (SLP): Modified Independent ?  ?

## 2022-01-23 NOTE — Progress Notes (Signed)
Orthopedic Tech Progress Note ?Patient Details:  ?Joseph Patrick ?1945/11/17 ?[id number] ? ?Called in order to [facility] for an AFO  ? ?Patient ID: Joseph Patrick, male   DOB: Dec 22, 1945, 76 y.o.   MRN: [id number] ? ?Joseph Patrick ?01/23/2022, 11:58 AM ? ?

## 2022-01-23 NOTE — Progress Notes (Signed)
?                                                       PROGRESS NOTE ? ? ?Subjective/Complaints: ?No new complaints this morning ?Discussed elevated CBGs and he was surprised by this ?Notes he has been fatigued ? ?ROS: +fatigue ? ? ?Objective: ?  ?No results found. ?Recent Labs  ?  01/23/22 ?[id number]  ?WBC 4.9  ?HGB 13.6  ?HCT 41.1  ?PLT 302  ? ?Recent Labs  ?  01/23/22 ?[id number]  ?NA 138  ?K 4.0  ?CL 107  ?CO2 24  ?GLUCOSE 133*  ?BUN 28*  ?CREATININE 1.35*  ?CALCIUM 9.0  ? ? ?Intake/Output Summary (Last 24 hours) at 01/23/2022 1051 ?Last data filed at 01/23/2022 0803 ?Gross per 24 hour  ?Intake 354 ml  ?Output 2750 ml  ?Net -2396 ml  ?  ? ?  ? ?Physical Exam: ?Vital Signs ?Blood pressure 137/78, pulse (!) 59, temperature 98.6 ?F (37 ?C), temperature source Oral, resp. rate 15, height 5\' 9"  (1.753 m), weight 74.2 kg, SpO2 98 %. ?Physical Exam ?Gen: no distress, normal appearing, BMI 24.16, fatigued ?HEENT: oral mucosa pink and moist, NCAT ?Cardio: Bradycardia ?Chest: normal effort, normal rate of breathing ?Abd: soft, non-distended ?Ext: no edema ?Psych: pleasant, normal affect, bright and positive ?Skin: intact ?Neurological:  ?   Comments: Patient is alert and oriented x3.  Makes eye contact with examiner.  Provides name and age.  Follows simple commands. 2/5 RUE with the exception of 3/5 EF. 3/5 RLE except for 0/5 EHL and DF. Sensation is intact.  ? ? ?Assessment/Plan: ?1. Functional deficits which require 3+ hours per day of interdisciplinary therapy in a comprehensive inpatient rehab setting. ?Physiatrist is providing close team supervision and 24 hour management of active medical problems listed below. ?Physiatrist and rehab team continue to assess barriers to discharge/monitor patient progress toward functional and medical goals ? ?Care Tool: ? ?Bathing ?   ?Body parts bathed by patient: Right arm, Face, Chest, Abdomen, Front perineal area, Buttocks, Right upper leg, Left upper leg, Right lower leg, Left lower leg  ?  Body parts bathed by helper: Left arm ?  ?  ?Bathing assist Assist Level: Minimal Assistance - Patient > 75% ?  ?  ?Upper Body Dressing/Undressing ?Upper body dressing   ?What is the patient wearing?: Pull over shirt ?   ?Upper body assist Assist Level: Minimal Assistance - Patient > 75% ?   ?Lower Body Dressing/Undressing ?Lower body dressing ? ? ?   ?What is the patient wearing?: Pants ? ?  ? ?Lower body assist Assist for lower body dressing: Minimal Assistance - Patient > 75% ?   ? ?Toileting ?Toileting Toileting Activity did not occur (Probation officer and hygiene only): N/A (no void or bm)  ?Toileting assist   ?  ?  ?Transfers ?Chair/bed transfer ? ?Transfers assist ?   ? ?Chair/bed transfer assist level: Moderate Assistance - Patient 50 - 74% ?  ?  ?Locomotion ?Ambulation ? ? ?Ambulation assist ? ?   ? ?  ?  ?   ? ?Walk 10 feet activity ? ? ?Assist ?   ? ?  ?   ? ?Walk 50 feet activity ? ? ?Assist   ? ?  ?   ? ? ?Walk 150 feet activity ? ? ?  Assist   ? ?  ?  ?  ? ?Walk 10 feet on uneven surface  ?activity ? ? ?Assist   ? ? ?  ?   ? ?Wheelchair ? ? ? ? ?Assist   ?  ?  ? ?  ?   ? ? ?Wheelchair 50 feet with 2 turns activity ? ? ? ?Assist ? ?  ?  ? ? ?   ? ?Wheelchair 150 feet activity  ? ? ? ?Assist ?   ? ? ?   ? ?Blood pressure 137/78, pulse (!) 59, temperature 98.6 ?F (37 ?C), temperature source Oral, resp. rate 15, height 5\' 9"  (1.753 m), weight 74.2 kg, SpO2 98 %. ? ? ? Medical Problem List and Plan: ?1. 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            -patient may shower ?            -ELOS/Goals: 5- 7 days         ?            -Admit to CIR ?2.  Antithrombotics: ?-DVT/anticoagulation:  Pharmaceutical: Lovenox ?            -antiplatelet therapy: Aspirin 81 mg daily and Plavix 75 mg daily with initial plan x3 weeks then aspirin alone ?3. Pain Management: Tylenol as needed ?4.  Mood: Provide emotional support ?            -antipsychotic agents: N/A ?5. Neuropsych: This patient is capable of making decisions on his own behalf. ?6. Skin/Wound Care: Routine skin checks ?7. Fluids/Electrolytes/Nutrition: Routine in and outs with follow-up chemistries ?8.  Chronic diastolic congestive heart failure.  Follows with Dr. Johnsie Cancel.  Continue Entresto 24-26 mg twice daily.  Monitor for any signs of fluid overload ?9.  Hypertension.Bidil 20-37.5 mg 3 times daily, Toprol 100 mg daily.  Monitor with increased mobility ?10.  Diabetes mellitus.  Hemoglobin A1c 7.1.  Farxiga 5 mg daily.  Diabetic teaching. Recommended avoiding added sugar.  ?11.  Hyperlipidemia.  Lipitor ?12.  CKD stage III.  Follow-up chemistries ?13.  Medical noncompliance.  Counseling ?14. Bradycardia: continue to monitor HR TID. Will maintain Toprol given HTN.  ?15. History of being overweight: BMI improved this hospitalization from 26.45 to 24.16. provide dietary education ?16. RLE foot drop: will order PRAFO, discussed will order AFO for ambulation ?17. Vitamin D deficiency: start ergocalciferol 50,000U once per week for 7 weeks.  ? ?LOS: ?1 days ?A FACE TO FACE EVALUATION WAS PERFORMED ? ?Virl Coble P Kyriakos Babler ?01/23/2022, 10:51 AM  ? ?  ?

## 2022-01-23 NOTE — Evaluation (Signed)
Physical Therapy Assessment and Plan ? ?Patient Details  ?Name: Joseph Patrick ?MRN: [id number] ?Date of Birth: Jul 06, 1946 ? ?PT Diagnosis: Abnormality of gait, Difficulty walking, and Hemiparesis dominant ?Rehab Potential: Good ?ELOS: 1-2 weeks  ? ?Today's Date: 01/23/2022 ?PT Individual Time: [id number] ?PT Individual Time Calculation (min): 73 min   ? ?Hospital Problem: Principal Problem: ?  Small vessel disease, cerebrovascular ? ? ?Past Medical History:  ?Past Medical History:  ?Diagnosis Date  ? CVA (cerebral vascular accident) [facility])   ? april 2023  ? Diabetes mellitus without complication ([place])   ? Hypertension   ? ?Past Surgical History: History reviewed. No pertinent surgical history. ? ?Assessment & Plan ?Clinical Impression: Patient is a 76 y.o. year old male  with history of hypertension, hyperlipidemia, diabetes mellitus, CKD stage III, medical noncompliance, recent CVA left hemispheric subcortical with admission 01/06/2022 - 01/10/2022 and placed on low-dose aspirin and Plavix x3 weeks then aspirin alone complicated by hypertension initially on Norvasc and Entresto initiated after findings of chronic congestive heart failure with ejection fraction of 25 to 30%.  Per chart review lives alone.  1 level apartment with elevator.  Independent prior to recent CVA.  Presented 01/14/2022 with increasing right-sided weakness as well as stuttering speech.  CT/MRI showed acute infarct involving the left corona radiata, caudate body, and posterior lentiform nucleus.  Recent echocardiogram with ejection fraction of 25 to 30% left ventricle demonstrated global hypokinesis grade 2 diastolic dysfunction.  Admission chemistries unremarkable aside glucose 136 creatinine 1.43, urine drug screen negative.  Neurology follow-up patient currently remains on low-dose aspirin and Plavix.  Lovenox for DVT prophylaxis.  Tolerating a regular consistency diet.  Therapy evaluations completed due to patient decreased functional mobility  right side weakness was admitted for a comprehensive rehab program. Has been noticing improved right sided strength. .  Patient transferred to CIR on 01/22/2022 .  Patient transferred to CIR on 01/22/2022 .  ? ?Patient currently requires min with mobility secondary to muscle weakness, decreased cardiorespiratoy endurance, unbalanced muscle activation, and decreased standing balance and hemiplegia.  Prior to hospitalization, patient was independent  with mobility and lived with Alone in a [place] home.  Home access is  Elevator. ? ?Patient will benefit from skilled PT intervention to maximize safe functional mobility, minimize fall risk, and decrease caregiver burden for planned discharge home with 24 hour supervision.  Anticipate patient will benefit from follow up [place] at discharge. ? ?PT - End of Session ?Activity Tolerance: Tolerates 10 - 20 min activity with multiple rests ?Endurance Deficit: Yes ?Endurance Deficit Description: Brief seated rest breaks for recovery b/w functional mobility tasks ?PT Assessment ?Rehab Potential (ACUTE/IP ONLY): Good ?PT Barriers to Discharge: Decreased caregiver support;Lack of/limited family support;Insurance for SNF coverage;Behavior ?PT Patient demonstrates impairments in the following area(s): Balance;Endurance;Motor;Safety;Behavior ?PT Transfers Functional Problem(s): Bed Mobility;Bed to Chair;Car ?PT Locomotion Functional Problem(s): Ambulation;Stairs ?PT Plan ?PT Intensity: Minimum of 1-2 x/day ,45 to 90 minutes ?PT Frequency: 5 out of 7 days ?PT Duration Estimated Length of Stay: 1-2 weeks ?PT Treatment/Interventions: Ambulation/gait training;Discharge planning;Functional mobility training;Psychosocial support;Therapeutic Activities;Visual/perceptual remediation/compensation;Wheelchair propulsion/positioning;Therapeutic Exercise;Skin care/wound management;Neuromuscular re-education;Disease management/prevention;Balance/vestibular training;Cognitive  remediation/compensation;DME/adaptive equipment instruction;Pain management;Splinting/orthotics;UE/LE Coordination activities;UE/LE Strength taining/ROM;Stair training;Patient/family education;Functional electrical stimulation;Community reintegration ?PT Transfers Anticipated Outcome(s): Supervision with LRAD ?PT Locomotion Anticipated Outcome(s): Supervision with LRAD ?PT Recommendation ?Recommendations for Other Services: Neuropsych consult ?Follow Up Recommendations: Home health PT;24 hour supervision/assistance ?Patient destination: Home ?Equipment Recommended: To be determined ? ? ?PT Evaluation ?Precautions/Restrictions ?Precautions ?Precautions: Fall ?Precaution Comments: R hemiparesis and foot  drop, decreased insight and questionable safety awareness ?Restrictions ?Weight Bearing Restrictions: No ?General ?  Vital SignsTherapy Vitals ?Temp: 98 ?F (36.7 ?C) ?Pulse Rate: 63 ?Resp: 18 ?BP: 119/60 ?Patient Position (if appropriate): Sitting ?Oxygen Therapy ?SpO2: 99 % ?O2 Device: Room Air ?Pain ?Pain Assessment ?Pain Scale: 0-10 ?Pain Score: 0-No pain ?Pain Interference ?Pain Interference ?Pain Effect on Sleep: 0. Does not apply - I have not had any pain or hurting in the past 5 days ?Pain Interference with Therapy Activities: 0. Does not apply - I have not received rehabilitationtherapy in the past 5 days ?Pain Interference with Day-to-Day Activities: 1. Rarely or not at all ?Home Living/Prior Functioning ?Home Living ?Available Help at Discharge: Available PRN/intermittently ?Type of Home: Apartment ?Home Access: Elevator ?Home Layout: One level ?Bathroom Shower/Tub: Walk-in shower ? Lives With: Alone ?Prior Function ?Level of Independence: Independent with basic ADLs;Independent with transfers;Independent with gait ? Able to Take Stairs?: Yes ?Driving: Yes ?Vocation: Retired ?Leisure: Hobbies-yes (Comment) (Golf) ?Vision/Perception  ?Vision - History ?Ability to See in Adequate Light: 0  Adequate ?Perception ?Perception: Within Functional Limits ?Praxis ?Praxis: Intact  ?Cognition ?Overall Cognitive Status: Within Functional Limits for tasks assessed ?Arousal/Alertness: Awake/alert ?Orientation Level: Oriented X4 ?Year: 2023 ?Month: May ?Day of Week: Correct ?Attention: Focused;Sustained ?Focused Attention: Appears intact ?Sustained Attention: Appears intact ?Memory: Impaired ?Memory Impairment: Decreased recall of new information ?Awareness: Impaired ?Awareness Impairment: Emergent impairment ?Problem Solving: Impaired (decreased insight) ?Behaviors: Impulsive (mild) ?Safety/Judgment: Impaired ?Comments: Pt presents with rapid speaking rate, mildly tagential, and slightly impulsive - states this to be baseline ?Sensation ?Sensation ?Light Touch: Appears Intact ?Hot/Cold: Appears Intact ?Proprioception: Appears Intact ?Stereognosis: Appears Intact ?Coordination ?Gross Motor Movements are Fluid and Coordinated: No ?Fine Motor Movements are Fluid and Coordinated: Yes ?Coordination and Movement Description: Impacted by R hemi and foot drop ?Motor  ?Motor ?Motor: Hemiplegia ?Motor - Skilled Clinical Observations: R hemi  ? ?Trunk/Postural Assessment  ?Cervical Assessment ?Cervical Assessment: Within Functional Limits ?Thoracic Assessment ?Thoracic Assessment: Exceptions to [facility] (rounded shoulders) ?Lumbar Assessment ?Lumbar Assessment: Exceptions to [facility] (flexible posterior pelvic tilt in sitting) ?Postural Control ?Postural Control: Within Functional Limits  ?Balance ?Balance ?Balance Assessed: Yes ?Static Sitting Balance ?Static Sitting - Balance Support: No upper extremity supported;Feet supported ?Static Sitting - Level of Assistance: 7: Independent ?Dynamic Sitting Balance ?Dynamic Sitting - Balance Support: No upper extremity supported;Feet supported;During functional activity ?Dynamic Sitting - Level of Assistance: 5: Stand by assistance ?Static Standing Balance ?Static Standing - Balance Support:  No upper extremity supported ?Static Standing - Level of Assistance: 4: Min assist ?Dynamic Standing Balance ?Dynamic Standing - Balance Support: No upper extremity supported ?Dynamic Standing - Level of Assistance: 3: Mod assist ?Extremity Assessment  ?

## 2022-01-24 ENCOUNTER — Other Ambulatory Visit (HOSPITAL_COMMUNITY): Payer: Self-pay

## 2022-01-24 LAB — GLUCOSE, CAPILLARY
Glucose-Capillary: 183 mg/dL — ABNORMAL HIGH (ref 70–99)
Glucose-Capillary: 140 mg/dL — ABNORMAL HIGH (ref 70–99)
Glucose-Capillary: 129 mg/dL — ABNORMAL HIGH (ref 70–99)
Glucose-Capillary: 199 mg/dL — ABNORMAL HIGH (ref 70–99)

## 2022-01-24 MED ORDER — DAPAGLIFLOZIN PROPANEDIOL 10 MG PO TABS
10.0000 mg | ORAL_TABLET | Freq: Every day | ORAL | Status: DC
  Administered 2022-01-25 – 2022-02-08 (×15): 10 mg via ORAL
  Filled 2022-01-24 (×15): qty 1

## 2022-01-24 MED ORDER — ACETAMINOPHEN 325 MG PO TABS
650.0000 mg | ORAL_TABLET | ORAL | Status: DC | PRN
  Administered 2022-01-24 (×2): 650 mg via ORAL
  Filled 2022-01-24: qty 2

## 2022-01-24 NOTE — Progress Notes (Signed)
Speech Language Pathology Daily Session Note ? ?Patient Details  ?Name: Joseph Patrick ?MRN: [id number] ?Date of Birth: 1945-11-24 ? ?Today's Date: 01/24/2022 ?SLP Individual Time: 7473-4037 ?SLP Individual Time Calculation (min): 60 min ? ?Short Term Goals: ?Week 1: SLP Short Term Goal 1 (Week 1): Pt will participate in education and recall 2 out of 4 compensatory memory strategies with Mod I. - Recalled 1 out of 4 compensatory strategies with Mod I; 2 out of 4 with Min A. Recalled 3 out of 4 facts with no delay; 4 out of 4 facts following 10-minute delay with distractions with Sup A for use of strategies. ? ?SLP Short Term Goal 2 (Week 1): Pt will complete additional iADL assessment tasks to further inform dc planning with 100% completion. - SLP did not formally address. Initiated education re: safety awareness and current deficits given safety concerns observed during PT evaluation on 01/23/2022.  ? ?Skilled Therapeutic Interventions: ?Pt seen this date for skilled ST intervention targeting cognitive-linguistic goals outlined above. Pt received awake/alert and lying semi-reclined in bed. Reports that he hit his L toe overnight, though does not recall event. Verbally perseverative at times, as well as tangential. Agreeable to ST intervention. ? ?SLP facilitated today's session by providing compensatory strategy training re: memory strategies (e.g. WARM strategies) to include use and purpose internal and external aids and basic principles of neuro-plasticity, Total A/direct instruction to promote and reinforce safety + emergent awareness, and verbal and written visual supports to "slow down + stop and think" prior to initiation of motor activities. Pt verbalized understanding of all education provided, though by the end of today's session, pt stated he was going to get up and go over to the recliner chair. As he verbalized this intent, he attempted to stand without assistance. SLP provided re-education re: importance of  receiving hands on assistance to transfer from bed to recliner chair as part of his safety plan to ensure that he remains safe during CIR admission due to his R hemiparesis. Reinforced orienting to visual supports of "stop and think" prior to moving. Following education, pt verbalized understanding once more, and remained in the bed. Bed alarm set.  ? 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 ? ?Pt left in bed with bed alarm on, call bell within reach, and all immediate needs met. Continue per current ST POC.  ? ?Pain ?No pain reported this session; NAD noted ? ?Therapy/Group: Individual Therapy ? ?Amenah Tucci A Caedan Sumler ?01/24/2022, 12:24 PM ?

## 2022-01-24 NOTE — Patient Care Conference (Signed)
Inpatient RehabilitationTeam Conference and Plan of Care Update ?Date: 01/24/2022   Time: 11:12 AM  ? ? ?Patient Name: Joseph Patrick      ?Medical Record Number: [id number]  ?Date of Birth: 26-Jul-1946 ?Sex: Male         ?Room/Bed: 4M04C/4M04C-01 ?Payor Info: Payor: [employer] MEDICARE / Plan: [employer] MEDICARE HMO / Product Type: *No Product type* /   ? ?Admit Date/Time:  01/22/2022  2:04 PM ? ?Primary Diagnosis:  Small vessel disease, cerebrovascular ? ?Hospital Problems: Principal Problem: ?  Small vessel disease, cerebrovascular ? ? ? ?Expected Discharge Date: Expected Discharge Date: 02/09/22 ? ?Team Members Present: ?Physician leading conference: Dr. Sula Soda ?Social Worker Present: Dossie Der, LCSW ?Nurse Present: Chana Bode, RN ?PT Present: Wynelle Link, PT ?OT Present: Dolphus Jenny, OT ?SLP Present: Sarita Bottom, SLP ? ?   Current Status/Progress Goal Weekly Team Focus  ?Bowel/Bladder ? ? Continent of B/B. LBM 01/22/22  Remain continent      ?Swallow/Nutrition/ Hydration ? ? N/A  N/A  N/A   ?ADL's ? ? min A UBD/Bathing, LBD, ambulatory bathroom transfers with RW + R saddle splint, RUE and hand at brummstrom level 2, some decreased safety awareness wanting to attempt ambulting/mobility himself in room  S to mod I  RUE NMR, self-care/balance/transfer retraining, pt/family/AE/DME education   ?Mobility ? ? Supervision bed mobility, minA sit<>stand and stand<>pivot transfers, CGA/minA gait using RW with hand splint and DF assist ace wrap >120ft. Min/modA 4 stairs (pt has elevator access to apartment). R foot drop and PF weakness - will need AFO and heel wedge. Decreased insight into deficits and poor safety awareness.  Supervision  RLE NMR, AFO consult, gait training with LRAD, safety awareness   ?Communication ? ? N/A  N/A  N/A   ?Safety/Cognition/ Behavioral Observations ? Min A  Mod I  emergent and anticipatory awareness, recall strategies, and safety awareness   ?Pain ? ? Lt toe pain,  prn tylenol with  good effect  <3/10. Assess and address.  Assess Q4 and prn   ?Skin ? ? Skin intact  Prevent new breakdown.  Assess Q Shift and prn   ? ? ?Discharge Planning:  ?Going back to apartment his sister lives down the hall and will be with him most of the time. He may still be alone at times.   ?Team Discussion: ?Patient with previous TBI with impulsivity, right neglect , decreased insight into deficits and poor safety awareness with right foot drop post CVA. ? ?Patient on target to meet rehab goals: ?yes, currently needs min assist for sit- stand and stand pivot transfers. Able to ambulate with CGA wearing right AFO. Goals for discharge set for supervision overall. ? ?*See Care Plan and progress notes for long and short-term goals.  ? ?Revisions to Treatment Plan:  ?N/A ?  ?Teaching Needs: ?Safety, medications, dietary modification, etc  ?Current Barriers to Discharge: ?Decreased caregiver support ? ?Possible Resolutions to Barriers: ?Family education ?  ? ? Medical Summary ?Current Status: type 2 diabetes, hypertension, CHF, right sided neglect, right sided foot drop ? Barriers to Discharge: Medical stability;New diabetic;Decreased family/caregiver support ? Barriers to Discharge Comments: type 2 diabetes, hypertension, CHF, right sided neglect, right sided foot drop ?Possible Resolutions to [employer]: provided dietary education, continue carb modified diet, continue to monitor blood pressure TID, discontinue TED garments, AFO ordered, continue Toprol ? ? ?Continued Need for Acute Rehabilitation Level of Care: The patient requires daily medical management by a physician with specialized training in physical  medicine and rehabilitation for the following reasons: ?Direction of a multidisciplinary physical rehabilitation program to maximize functional independence : Yes ?Medical management of patient stability for increased activity during participation in an intensive rehabilitation regime.: Yes ?Analysis of  laboratory values and/or radiology reports with any subsequent need for medication adjustment and/or medical intervention. : Yes ? ? ?I attest that I was present, lead the team conference, and concur with the assessment and plan of the team. ? ? ?Chana Bode B ?01/24/2022, 5:19 PM  ? ? ? ? ? ? ?

## 2022-01-24 NOTE — Evaluation (Signed)
Recreational Therapy Assessment and Plan ? ?Patient Details  ?Name: Joseph Patrick ?MRN: [id number] ?Date of Birth: [birth year] ?Today's Date: 01/24/2022 ? ?Rehab Potential:  Good ?ELOS:   d/c 5/26 ? ?Assessment ? ?Hospital Problem: Principal Problem: ?  Small vessel disease, cerebrovascular ?  ?  ?Past Medical History:  ?    ?Past Medical History:  ?Diagnosis Date  ? CVA (cerebral vascular accident) [facility])    ?  april 2023  ? Diabetes mellitus without complication ([place])    ? Hypertension    ?  ?Past Surgical History: History reviewed. No pertinent surgical history. ?  ?Assessment & Plan ?Clinical Impression: Patient is a 76 y.o. year old male  with history of hypertension, hyperlipidemia, diabetes mellitus, CKD stage III, medical noncompliance, recent CVA left hemispheric subcortical with admission 01/06/2022 - 01/10/2022 and placed on low-dose aspirin and Plavix x3 weeks then aspirin alone complicated by hypertension initially on Norvasc and Entresto initiated after findings of chronic congestive heart failure with ejection fraction of 25 to 30%.  Per chart review lives alone.  1 level apartment with elevator.  Independent prior to recent CVA.  Presented 01/14/2022 with increasing right-sided weakness as well as stuttering speech.  CT/MRI showed acute infarct involving the left corona radiata, caudate body, and posterior lentiform nucleus.  Recent echocardiogram with ejection fraction of 25 to 30% left ventricle demonstrated global hypokinesis grade 2 diastolic dysfunction.  Admission chemistries unremarkable aside glucose 136 creatinine 1.43, urine drug screen negative.  Neurology follow-up patient currently remains on low-dose aspirin and Plavix.  Lovenox for DVT prophylaxis.  Tolerating a regular consistency diet.  Therapy evaluations completed due to patient decreased functional mobility right side weakness was admitted for a comprehensive rehab program. Has been noticing improved right sided strength. .  Patient  transferred to CIR on 01/22/2022.   ? ?Pt presents with decreased activity tolerance, decreased functional mobility, decreased balance, decreased coordination, decreased awareness Limiting pt's independence with leisure/community pursuits. ? ?Met with pt today to discuss TR services including leisure education, activity analysis/modifications self advocacy and stress management.  Also discussed the importance of social, emotional, spiritual health in addition to physical health and their effects on overall health and wellness.  Pt stated understanding. ? ? Plan ? Min 1 TR session >20 minutes per week during LOS ? ?Recommendations for other services: None  ? ?Discharge Criteria: Patient will be discharged from TR if patient refuses treatment 3 consecutive times without medical reason.  If treatment goals not met, if there is a change in medical status, if patient makes no progress towards goals or if patient is discharged from hospital. ? ?The above assessment, treatment plan, treatment alternatives and goals were discussed and mutually agreed upon: by patient ? ?Eric Nees ?01/24/2022, 4:08 PM  ?

## 2022-01-24 NOTE — Progress Notes (Signed)
Patient ID: Joseph Patrick, male   DOB: January 08, 1946, 76 y.o.   MRN: [id number] ?Met with the patient to review rehab process, team conference and plan of care. Education materials given on diabetes  (A1 C 7.1) and HTN, HLD (LDL 127/Trig 82) and heart failure. Reviewed medications and dietary modification recommendations. Continue to follow along to discharge to address educational needs to facilitate preparation for discharge home. Joseph Patrick ? ?

## 2022-01-24 NOTE — Progress Notes (Signed)
Physical Therapy Session Note ? ?Patient Details  ?Name: Joseph Patrick ?MRN: [id number] ?Date of Birth: 03-19-1946 ? ?Today's Date: 01/24/2022 ?PT Individual Time: 1030-1103 + 1300-1415 ?PT Individual Time Calculation (min): 33 min  + 75 min ? ?Short Term Goals: ?Week 1:  PT Short Term Goal 1 (Week 1): Pt will complete bed<>chair transfers with CGA and LRAD ?PT Short Term Goal 2 (Week 1): Pt will ambulate 162f with CGA and LRAD ?PT Short Term Goal 3 (Week 1): Pt will complete functional outcome measure to assess falls risk ? ?Skilled Therapeutic Interventions/Progress Updates:  ?   ?1st session: ?Pt greeted seated in w/c and agreeable to PT tx. Denies pain. Pt reports that MD ordered to discontinue compression socks due to elevated BP - removed with totalA for time. Transported to main rehab gym in w/c and assisted to mat table with minA stand<>pivot transfer. Sitting balance indep at EOM. Remainder of session focused on NMES for R foot drop and quad weakness - using EMPI select and intensity set to 65 for ankle DF and 35 for VMO/quads, completed 15 minutes with 7 second on time and 4 second off time. Good activation in ankle DF but no carryover without NMES applied. Transferred back to his w/c with minA transfer and returned to his room - assisted to recliner in similar manner and pt remained seated in recliner with call bell in reach, all personal items met, BLE elevated. Pt reminded of "call don't fall" policy.  ? ?2nd session: ?Pt sitting in recliner and agreeable to PT tx. Denies pain. Stand<>pivot transfer with CGA/minA to w/c towards his stronger L side. Transported in w/c to main rehab gym for time management. Focused session on gait training and AFO fitting. Donned his own tennis shoes with totalA. Multiple gait trials for this - [id number] CGA/minA and RW (using saddle splint for RUE). Determined best fitting AFO was Thusane PLF (rigid) and also used heel wedge to manage hyper extension. Also benefited  from shoe cover due to toe catching - will eventually require toe cap. Pt with significant improvement in genu recurvatum with these adaptations. Continues to compensate with circumduction at times on RLE. ? ?Stair training up/down x12 steps requiring minA and 1 hand rail on L - cues for step-to pattern and for safety during sequencing to make sure he ascends with his L foot leading and R foot descending.  ? ?Kinetron completed with resistance set to 20cm/sec - seated on kinetron for safety. Targeting RLE strengthening for hip extension and hamstring activation. ? ?Returned to room in w/c and assisted to recliner with minA stand<>pivot transfer. Concluded session seated in recliner, all needs met, call bell in lap. CSW entering room. ? ? ?Therapy Documentation ?Precautions:  ?Precautions ?Precautions: Fall ?Precaution Comments: R hemiparesis and foot drop, decreased insight and questionable safety awareness ?Restrictions ?Weight Bearing Restrictions: No ?General: ?  ? ?Therapy/Group: Individual Therapy ? ?Madalene Mickler P Isabelle Matt ?01/24/2022, 7:31 AM  ?

## 2022-01-24 NOTE — Progress Notes (Signed)
Occupational Therapy Session Note ? ?Patient Details  ?Name: Joseph Patrick ?MRN: [id number] ?Date of Birth: [birth year] ? ?Today's Date: 01/24/2022 ?OT Individual Time: 0900-1000 ?OT Individual Time Calculation (min): 60 min  ? ? ?Short Term Goals: ?Week 1:  OT Short Term Goal 1 (Week 1): Pt will complete standing grooming task at sink with CGA. ?OT Short Term Goal 2 (Week 1): Pt will complete shower transfer with min A. ?OT Short Term Goal 3 (Week 1): Pt will therapeutically position RUE when seated in chair/bed with no more than min VCs. ?OT Short Term Goal 4 (Week 1): Pt will don shirt with S. ? ?Skilled Therapeutic Interventions/Progress Updates:  ?  Subjective: Pt states he had blurry vision earlier and "feels weak" but vision is better now.   ?  Objective:  Pt semi reclined in bed. Assessed orthostatic vitals: Semi reclined 160/68 pulse 59; Supine to sit with CGA.  Seated EOB with supervision: BP 153/71 pulse 57; Completed sit to stand with min assist. Standing at [place] with CGA: BP 175/71 pulse 63.Made nurse aware of symptoms and vital readings.  Nurse administered medications to address BP therefore missed 15 minutes of treatment.  OT returned and assessed BP in standing once more: 168/115.  Doffed TEDs and ambulated to sink with min assist using RW with walker splint on right.  Stand to sit with min assist at w/c.  Doffed shirt, bathed UB, and donned shirt with distant supervision.  Pt doffed shorts, bathed LB, and donned clean shorts with min assist at sit<>stand level.  Pt needed min-mod intermittent cues to attend to task at hand due to internal distractibility.  Pt also needed cues and encouragement to attend to RUE and utilize actively versus passively lifting.  Call bell in reach, seat alarm on.   ?  Assessment:  Pt making progress evidenced by increased independence with UB self care and improved functional use of RUE.  Primary barriers today included external distractibility and right neglect. ?  Plan: Pt  would benefit from further training on NMES to RUE and strengthening neuro re-ed RUE.  ? ?Therapy Documentation ?Precautions:  ?Precautions ?Precautions: Fall ?Precaution Comments: R hemiparesis and foot drop, decreased insight and questionable safety awareness ?Restrictions ?Weight Bearing Restrictions: No ? ? ? ?Therapy/Group: Individual Therapy ? ?Caryl Asp [name] ?01/24/2022, 12:32 PM ?

## 2022-01-24 NOTE — Progress Notes (Addendum)
?                                                       PROGRESS NOTE ? ? ?Subjective/Complaints: ?Concerned that he received 5 bacon strips this morning despite being on a carb modified diet. Also received potatoes, english muffin ? ?ROS: +fatigue, denies dizziness ? ? ?Objective: ?  ?No results found. ?Recent Labs  ?  01/23/22 ?[id number]  ?WBC 4.9  ?HGB 13.6  ?HCT 41.1  ?PLT 302  ? ?Recent Labs  ?  01/23/22 ?[id number]  ?NA 138  ?K 4.0  ?CL 107  ?CO2 24  ?GLUCOSE 133*  ?BUN 28*  ?CREATININE 1.35*  ?CALCIUM 9.0  ? ? ?Intake/Output Summary (Last 24 hours) at 01/24/2022 0928 ?Last data filed at 01/24/2022 0500 ?Gross per 24 hour  ?Intake 234 ml  ?Output 2250 ml  ?Net -2016 ml  ?  ? ?  ? ?Physical Exam: ?Vital Signs ?Blood pressure 125/72, pulse 60, temperature 97.8 ?F (36.6 ?C), temperature source Oral, resp. rate 14, height 5\' 9"  (1.753 m), weight 74.2 kg, SpO2 99 %. ?Physical Exam ?Gen: no distress, normal appearing, BMI 24.16, fatigued ?HEENT: oral mucosa pink and moist, NCAT ?Cardio: Bradycardia ?Chest: normal effort, normal rate of breathing ?Abd: soft, non-distended ?Ext: no edema, TED garments in place ?Psych: pleasant, normal affect, bright and positive ?Skin: intact ?Neurological:  ?   Comments: Patient is alert and oriented x3.  Makes eye contact with examiner.  Provides name and age.  Follows simple commands. 2/5 RUE with the exception of 3/5 EF. 3/5 RLE except for 0/5 EHL and DF. Sensation is intact.  ? ? ?Assessment/Plan: ?1. Functional deficits which require 3+ hours per day of interdisciplinary therapy in a comprehensive inpatient rehab setting. ?Physiatrist is providing close team supervision and 24 hour management of active medical problems listed below. ?Physiatrist and rehab team continue to assess barriers to discharge/monitor patient progress toward functional and medical goals ? ?Care Tool: ? ?Bathing ?   ?Body parts bathed by patient: Right arm, Face, Chest, Abdomen, Front perineal area, Buttocks, Right  upper leg, Left upper leg, Right lower leg, Left lower leg  ? Body parts bathed by helper: Left arm ?  ?  ?Bathing assist Assist Level: Minimal Assistance - Patient > 75% ?  ?  ?Upper Body Dressing/Undressing ?Upper body dressing   ?What is the patient wearing?: Pull over shirt ?   ?Upper body assist Assist Level: Minimal Assistance - Patient > 75% ?   ?Lower Body Dressing/Undressing ?Lower body dressing ? ? ?   ?What is the patient wearing?: Pants ? ?  ? ?Lower body assist Assist for lower body dressing: Minimal Assistance - Patient > 75% ?   ? ?Toileting ?Toileting Toileting Activity did not occur (Probation officer and hygiene only): N/A (no void or bm)  ?Toileting assist   ?  ?  ?Transfers ?Chair/bed transfer ? ?Transfers assist ?   ? ?Chair/bed transfer assist level: Minimal Assistance - Patient > 75% ?  ?  ?Locomotion ?Ambulation ? ? ?Ambulation assist ? ?   ? ?Assist level: Minimal Assistance - Patient > 75% ?Assistive device: Walker-rolling ?Max distance: 124ft  ? ?Walk 10 feet activity ? ? ?Assist ?   ? ?Assist level: Minimal Assistance - Patient > 75% ?Assistive device: Walker-rolling  ? ?Walk 50  feet activity ? ? ?Assist   ? ?Assist level: Minimal Assistance - Patient > 75% ?Assistive device: Walker-rolling  ? ? ?Walk 150 feet activity ? ? ?Assist   ? ?Assist level: Minimal Assistance - Patient > 75% ?Assistive device: Walker-rolling ?  ? ?Walk 10 feet on uneven surface  ?activity ? ? ?Assist Walk 10 feet on uneven surfaces activity did not occur: Safety/medical concerns ? ? ?  ?   ? ?Wheelchair ? ? ? ? ?Assist Is the patient using a wheelchair?: No ?  ?Wheelchair activity did not occur: N/A ? ?  ?   ? ? ?Wheelchair 50 feet with 2 turns activity ? ? ? ?Assist ? ?  ?Wheelchair 50 feet with 2 turns activity did not occur: N/A ? ? ?   ? ?Wheelchair 150 feet activity  ? ? ? ?Assist ? Wheelchair 150 feet activity did not occur: N/A ? ? ?   ? ?Blood pressure 125/72, pulse 60, temperature 97.8 ?F (36.6 ?C),  temperature source Oral, resp. rate 14, height 5\' 9"  (1.753 m), weight 74.2 kg, SpO2 99 %. ? ? ? Medical Problem List and Plan: ?1. 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            -patient may shower ?            -ELOS/Goals: 2.5 weeks    ?            -Admit to CIR ?2.  Antithrombotics: ?-DVT/anticoagulation:  Pharmaceutical: Lovenox ?            -antiplatelet therapy: Aspirin 81 mg daily and Plavix 75 mg daily with initial plan x3 weeks then aspirin alone ?3. Pain Management: Tylenol as needed ?4. Mood: Provide emotional support ?            -antipsychotic agents: N/A ?5. Neuropsych: This patient is capable of making decisions on his own behalf. ?6. Skin/Wound Care: Routine skin checks ?7. Fluids/Electrolytes/Nutrition: Routine in and outs with follow-up chemistries ?8.  Chronic diastolic congestive heart failure.  Follows with Dr. Johnsie Cancel.  Continue Entresto 24-26 mg twice daily.  Monitor for any signs of fluid overload ?9.  Hypertension.Blood pressure is stable, continue Bidil 20-37.5 mg 3 times daily, Toprol 100 mg daily.  Monitor with increased mobility. Provided list of foods to help with blood pressure. Discontinued order for TEDs.  ?10.  Diabetes mellitus.  Hemoglobin A1c 7.1.  Farxiga 5 mg daily.  Diabetic teaching. Recommended avoiding added sugar. Provided education to avoid foods with added sugar. Reviewed CBGs [id number] and discussed with patient. Continue carb modified diet.  ?11.  Hyperlipidemia.  Lipitor ?12.  CKD stage III.  Follow-up chemistries ?13.  Medical noncompliance.  Counseling ?14. Bradycardia: continue to monitor HR TID. Will maintain Toprol given HTN.  ?15. History of being overweight: BMI improved this hospitalization from 26.45 to 24.16. provide dietary education. Provided list of foods to assist in weight loss.  ?16. RLE foot drop: AFO ordered. ?17.  Vitamin D deficiency: start ergocalciferol 50,000U once per week for 7 weeks. Discussed with patient and he is agreeable ?18. Poor safety awareness: continue to provide education ?19. Fatigue: discussed that Vitamin D supplement can help this. B12 is also at low end of normal, can consider supplementation if fatigue persists. ?20. History of TBI with residual cognitive deficits: continue SLP ? ?LOS: ?2 days ?A FACE TO FACE EVALUATION  WAS PERFORMED ? ?Joseph Patrick ?01/24/2022, 9:28 AM  ? ?  ?

## 2022-01-25 DIAGNOSIS — E1169 Type 2 diabetes mellitus with other specified complication: Secondary | ICD-10-CM

## 2022-01-25 DIAGNOSIS — E559 Vitamin D deficiency, unspecified: Secondary | ICD-10-CM

## 2022-01-25 DIAGNOSIS — T733XXS Exhaustion due to excessive exertion, sequela: Secondary | ICD-10-CM

## 2022-01-25 LAB — GLUCOSE, CAPILLARY
Glucose-Capillary: 115 mg/dL — ABNORMAL HIGH (ref 70–99)
Glucose-Capillary: 148 mg/dL — ABNORMAL HIGH (ref 70–99)
Glucose-Capillary: 104 mg/dL — ABNORMAL HIGH (ref 70–99)
Glucose-Capillary: 157 mg/dL — ABNORMAL HIGH (ref 70–99)

## 2022-01-25 NOTE — Progress Notes (Signed)
Patient ID: Joseph Patrick, male   DOB: 1946-06-16, 76 y.o.   MRN: [id number]  met with pt yesterday to update him regarding team conference goals of supervision-mod/I level and target discharge date of 5/26. He feels he will do better and be able to go home sooner. He is very motivated to do well but aware of his need to slow down and be safe. Will continue to work on discharge needs. ?

## 2022-01-25 NOTE — Progress Notes (Signed)
Speech Language Pathology Daily Session Note ? ?Patient Details  ?Name: Joseph Patrick ?MRN: [id number] ?Date of Birth: November 12, 1945 ? ?Today's Date: 01/25/2022 ?SLP Individual Time: 1848-5927 ?SLP Individual Time Calculation (min): 45 min ? ?Short Term Goals: ?Week 1: SLP Short Term Goal 1 (Week 1): Pt will participate in education and recall 2 out of 4 compensatory memory strategies with Mod I. - Recalled 1 out of 4 compensatory memory strategies with Mod I; 2 out of 4 with Min-Mod verbal A. ? ?SLP Short Term Goal 2 (Week 1): Pt will complete additional iADL assessment tasks to further inform dc planning with 100% completion. - Named 3 unsafe activities and 1 safe activity to participate in given his current deficits with Min-Mod verbal A for metacognition. ? ?Skilled Therapeutic Interventions: ?Pt seen this date for skilled ST intervention for cognitive-linguistic goals outlined above. Pt received awake/alert and washing face at sink with. Reports he is ready to participate in therapy. Agreeable to ST intervention. ? ?SLP facilitated today's session by providing the following ST intervention during functional tx tasks: re-education and reinforcement of compensatory memory strategies and question prompts with education to improve safety awareness and current deficits + limitations. Pt vebralized understanding of education, though will likely need reinforcement. States that he could safely ambulate on his own if needed. Per d/w interdisciplinary team on 5/11, pt requires Min A for mobility at this time. Continued to reinforce slowing his pace and "stop and think" before initiating motor activity. Reviewed "call, don't fall" signage and importance of following CIR protocol for calling and waiting for assistance to ambulate in his room, given reports that he will get up and go to the bathroom himself, if staff does not attend to his needs quick enough. RN notified and reports pt has been utilizing call bell appropriately  and has not been witnessed ambulating alone in his room. Remains verbally perseverative at times, with pressed and hurried speech rate. ? ?Pt left in recliner chair with safety belt donned, call bell reviewed and within reach, and all immediate needs met. Continue per current ST POC. ? ?Pain ?Pt did not report pain this session; NAD noted. ? ? ?Therapy/Group: Individual Therapy ? ?Rico Massar A Skyeler Smola ?01/25/2022, 12:59 PM ?

## 2022-01-25 NOTE — Plan of Care (Signed)
?  Problem: Consults ?Goal: RH STROKE PATIENT EDUCATION ?Description: See Patient Education module for education specifics  ?Outcome: Progressing ?  ?Problem: RH BOWEL ELIMINATION ?Goal: RH STG MANAGE BOWEL WITH ASSISTANCE ?Description: STG Manage Bowel with mod I Assistance. ?Outcome: Progressing ?Goal: RH STG MANAGE BOWEL W/MEDICATION W/ASSISTANCE ?Description: STG Manage Bowel with Medication with mod I Assistance. ?Outcome: Progressing ?  ?Problem: RH SAFETY ?Goal: RH STG ADHERE TO SAFETY PRECAUTIONS W/ASSISTANCE/DEVICE ?Description: STG Adhere to Safety Precautions With cues Assistance/Device. ?Outcome: Progressing ?  ?Problem: RH KNOWLEDGE DEFICIT ?Goal: RH STG INCREASE KNOWLEDGE OF DIABETES ?Description: Patient and niece will be able to manage DM with medications and dietary modifications using handouts and educational resources independently ?Outcome: Progressing ?Goal: RH STG INCREASE KNOWLEDGE OF HYPERTENSION ?Description: Patient and niece will be able to manage HTN with medications and dietary modifications using handouts and educational resources independently ?Outcome: Progressing ?Goal: RH STG INCREASE KNOWLEGDE OF HYPERLIPIDEMIA ?Description: Patient and niece will be able to manage HLD with medications and dietary modifications using handouts and educational resources independently ?Outcome: Progressing ?Goal: RH STG INCREASE KNOWLEDGE OF STROKE PROPHYLAXIS ?Description: Patient and niece will be able to manage secondary stroke risks ? with medications and dietary modifications using handouts and educational resources independently ?Outcome: Progressing ?  ?Problem: Education: ?Goal: Ability to demonstrate management of disease process will improve ?Outcome: Progressing ?Goal: Ability to verbalize understanding of medication therapies will improve ?Outcome: Progressing ?Goal: Individualized Educational Video(s) ?Outcome: Progressing ?  ?

## 2022-01-25 NOTE — Progress Notes (Signed)
Occupational Therapy Session Note ? ?Patient Details  ?Name: Joseph Patrick ?MRN: [id number] ?Date of Birth: [birth year] ? ?Today's Date: 01/25/2022 ?OT Individual Time: 3893-7342 ?OT Individual Time Calculation (min): 75 min  ? ? ?Short Term Goals: ?Week 1:  OT Short Term Goal 1 (Week 1): Pt will complete standing grooming task at sink with CGA. ?OT Short Term Goal 2 (Week 1): Pt will complete shower transfer with min A. ?OT Short Term Goal 3 (Week 1): Pt will therapeutically position RUE when seated in chair/bed with no more than min VCs. ?OT Short Term Goal 4 (Week 1): Pt will don shirt with S. ? ?Skilled Therapeutic Interventions/Progress Updates:  ?  Subjective: Pt states he feels that the boot he wore last night makes his leg feel weaker.  Perseverating on this topic throughout session requiring mod cues to redirect.  ?  Objective:  Pt semi reclined in bed. Supine to sit with supervision.  Donned shoes with max assist including right AFO.  Ambulated to w/c with CGA and cues to encourage increased active reach of RUE when placing on walker splint.  Pt transported to gym and completed stand pivot to EOM with CGA.  Neuro re-ed completed to RUE with mirror provided for increased sensory feedback for motor relearning purposes.  Pt pushed and pulled ball at right side with min manual assist to keep arm from falling off side of ball.  Pt completed supine triceps press and shoulder abduction with elbow straight, and shoulder protraction/retraction with targets provided to facilitate external focus for motor planning.  3 x 10 reps each. Attended NMES applied to left wrist extensors and simultaneous finger flexion   Call bell in reach, seat alarm on.   ?  Assessment:  Pt making progress evidenced by improved functional use against gravity RUE.  Primary barriers today included internal distractibility which required significant cues to redirect. ?  Plan: Pt would benefit from further training on neuro re-ed of RUE.   ? ?Therapy Documentation ?Precautions:  ?Precautions ?Precautions: Fall ?Precaution Comments: R hemiparesis and foot drop, decreased insight and questionable safety awareness ?Restrictions ?Weight Bearing Restrictions: No ? ? ? ?Therapy/Group: Individual Therapy ? ?Joseph Patrick ?01/25/2022, 12:22 PM ?

## 2022-01-25 NOTE — Progress Notes (Signed)
Physical Therapy Session Note ? ?Patient Details  ?Name: Joseph Patrick ?MRN: [id number] ?Date of Birth: 1946-04-09 ? ?Today's Date: 01/25/2022 ?PT Individual Time: 5956-3875 + 6433-2951 ?PT Individual Time Calculation (min): 45 min  + 73 min ? ?Short Term Goals: ?Week 1:  PT Short Term Goal 1 (Week 1): Pt will complete bed<>chair transfers with CGA and LRAD ?PT Short Term Goal 2 (Week 1): Pt will ambulate 161f with CGA and LRAD ?PT Short Term Goal 3 (Week 1): Pt will complete functional outcome measure to assess falls risk ? ?Skilled Therapeutic Interventions/Progress Updates:  ?   ?1st session: ?Direct handoff of care from OT in rehab gym - pt sitting in w/c. Pt requesting to return to his room to urinate - transported in w/c and provided urinal due to urgency - pt continent of bladder. RN also arriving for morning rx.  ? ?Transported back to rehab gym to focus on gait training and NMR. Pt with AFO/heel wedge on R foot. Gait training 2x2064fwith RW (hand splint) and CGA on level surfaces. Pt continuing to catch toe ~50% of gait. Donned shoe cover and this minimized toe catching - anticipate he will require toe cap to shoe. Other primary gait deficits include hip hiking on R, mild circumduction, decreased R heel strike, and genu recurvatum ~10% of the time during stance on R. NMR for RLE strengthening while supine on mat table - 2x15 bridges with ball b/w legs to promote adduction/internal rotators, knee falls in/out with resistance to RLE for strengthening external/internal rotators.  ? ?Returned to room in w/c and pt requesting to be wheeled sinkside to complete a few self care tasks. All needs met with call bell in reach - reminded of "call don't fall" policy.  ? ?2nd session: ?Pt sitting in recliner to start - agreeable to PT tx. Denies pain. Pt with AFO and tennis shoes already on. Sit<>stand to RW with CGA from recliner - ambulates ~30020fo main rehab gym with CGA and RW. No shoe cover on R foot and continues  to struggle with R toe catching during swing - some mild vaulting on L and hip hiking on R to compensate. Focused remainder of session on gait training using LiteGait over Treadmill - no BWS. Donned/doffed harness in standing. MinA for stepping up/off of treadmill. Completed the following gait trials: ? ?1) 10 minutes, [id number] support. Shoe cover on R - working on increasing R knee>hip flexion in swing to reduce to catching. No knee buckling with genu recuvatum ~10% of the time (has heel wedge in) ? ?2) 4 minutes, 105ft,2fmph, 61mUE support - significant change in gait quality - difficulty with completing step-through gait cycle with decreased R step length and increased truncal imbalance. ? ?3) 3min40 80mconds, 65ft, 0.6f, [id number], side stepping R to encourage R stance phase control and R hip abductor strengthening. Small step length for side stepping and difficulty avoiding TFL/hip flexor compensation. ? ?Ambulated back to his room, ~300ft, with65f and RW with shoe cover on R foot - no toe catching but shoe cover preventing this (anticipate he will require toe cap). Completed sit>supine with supervision assist using hospital bed features - difficulty bringing RLE fully onto bed. Able to reposition self without assist - bed alarm on, call bell in lap, all needs met.  ? ?Therapy Documentation ?Precautions:  ?Precautions ?Precautions: Fall ?Precaution Comments: R hemiparesis and foot drop, decreased insight and questionable safety awareness ?Restrictions ?Weight Bearing Restrictions: No ?General: ?  ? ?  Therapy/Group: Individual Therapy ? ?Jeania Nater P Blayde Bacigalupi ?01/25/2022, 7:10 AM  ?

## 2022-01-25 NOTE — IPOC Note (Signed)
Overall Plan of Care (IPOC) ?Patient Details ?Name: Joseph Patrick ?MRN: [id number] ?DOB: 1945-11-16 ? ?Admitting Diagnosis: Small vessel disease, cerebrovascular ? ?Hospital Problems: Principal Problem: ?  Small vessel disease, cerebrovascular ? ? ? ? Functional Problem List: ?Nursing Endurance, Medication Management, Safety, Bowel  ?PT Balance, Endurance, Motor, Safety, Behavior  ?OT Balance, Cognition, Endurance, Motor, Safety  ?SLP Safety  ?TR    ?    ? Basic ADL?s: ?OT Grooming, Bathing, Dressing, Toileting, Eating  ? ?  Advanced  ADL?s: ?OT Simple Meal Preparation, Light Housekeeping  ?   ?Transfers: ?PT Bed Mobility, Bed to Chair, Car  ?OT Toilet, Tub/Shower  ? ?  Locomotion: ?PT Ambulation, Stairs  ? ?  Additional Impairments: ?OT Fuctional Use of Upper Extremity  ?SLP None ?  ?   ?TR    ? ? ?Anticipated Outcomes ?Item Anticipated Outcome  ?Self Feeding mod I  ?Swallowing ? N/A ?  ?Basic self-care ? mod I to S  ?Toileting ? mod I ?  ?Bathroom Transfers S  ?Bowel/Bladder ? manage bowel w mod I  ?Transfers ? Supervision with LRAD  ?Locomotion ? Supervision with LRAD  ?Communication ? N/A  ?Cognition ? Mod I  ?Pain ? n/a  ?Safety/Judgment ? manage safety w cues  ? ?Therapy Plan: ?PT Intensity: Minimum of 1-2 x/day ,45 to 90 minutes ?PT Frequency: 5 out of 7 days ?PT Duration Estimated Length of Stay: 1-2 weeks ?OT Intensity: Minimum of 1-2 x/day, 45 to 90 minutes ?OT Frequency: 5 out of 7 days ?OT Duration/Estimated Length of Stay: 16-19 days ?SLP Intensity: Minumum of 1-2 x/day, 30 to 90 minutes ?SLP Frequency: 1 to 3 out of 7 days ?SLP Duration/Estimated Length of Stay: 1-2 weeks ? ? Team Interventions: ?Nursing Interventions Disease Management/Prevention, Bowel Management, Patient/Family Education, Medication Management, Discharge Planning  ?PT interventions Ambulation/gait training, Discharge planning, Functional mobility training, Psychosocial support, Therapeutic Activities, Visual/perceptual  remediation/compensation, Wheelchair propulsion/positioning, Therapeutic Exercise, Skin care/wound management, Neuromuscular re-education, Disease management/prevention, Warden/ranger, Cognitive remediation/compensation, DME/adaptive equipment instruction, Pain management, Splinting/orthotics, UE/LE Coordination activities, UE/LE Strength taining/ROM, Stair training, Patient/family education, Functional electrical stimulation, Community reintegration  ?OT Interventions Balance/vestibular training, Disease mangement/prevention, Neuromuscular re-education, Self Care/advanced ADL retraining, Therapeutic Exercise, Wheelchair propulsion/positioning, UE/LE Strength taining/ROM, Skin care/wound managment, Pain management, DME/adaptive equipment instruction, Cognitive remediation/compensation, Community reintegration, Functional electrical stimulation, Patient/family education, Splinting/orthotics, UE/LE Coordination activities, Therapeutic Activities, Psychosocial support, Functional mobility training, Discharge planning  ?SLP Interventions Patient/family education, Internal/external aids, Functional tasks  ?TR Interventions    ?SW/CM Interventions Discharge Planning, Psychosocial Support, Patient/Family Education  ? ?Barriers to Discharge ?MD  Medical stability  ?Nursing Decreased caregiver support, Medication compliance ?1 level senior living apt w Engineer, structural, niece to assist prn  ?PT Decreased caregiver support, Lack of/limited family support, Insurance for SNF coverage, Behavior ?   ?OT Decreased caregiver support, Inaccessible home environment, Home environment access/layout ?   ?SLP   ?   ?SW Insurance for SNF coverage, Decreased caregiver support ?   ? ?Team Discharge Planning: ?Destination: PT-Home ,OT- Home , SLP-Home ?Projected Follow-up: PT-Home health PT, 24 hour supervision/assistance, OT-  Outpatient OT, SLP-None (intermittent supervision for iADL's) ?Projected Equipment Needs: PT-To be  determined, OT- To be determined, SLP-None recommended by SLP ?Equipment Details: PT- , OT-  ?Patient/family involved in discharge planning: PT- Patient,  OT-Patient, SLP-Patient ? ?MD ELOS: 2.5 weeks ?Medical Rehab Prognosis:  Excellent ?Assessment:  The patient has been admitted for CIR therapies with the diagnosis of left corona radiata, caudate body, and posterior  lentiform nucleus infarction. The team will be addressing functional mobility, strength, stamina, balance, safety, adaptive techniques and equipment, self-care, bowel and bladder mgt, patient and caregiver education. Goals have been set at modI. Anticipated discharge destination is home.  ? ? ?  ? ? ?See Team Conference Notes for weekly updates to the plan of care  ?

## 2022-01-25 NOTE — Progress Notes (Signed)
?                                                       PROGRESS NOTE ? ? ?Subjective/Complaints: ?Asks why I sent bacon in for him again, encouraged him to order his own meals and discussed low carb/high protein meals for diabetes ? ?ROS: +fatigue, denies dizziness, pain ? ? ?Objective: ?  ?No results found. ?Recent Labs  ?  01/23/22 ?[id number]  ?WBC 4.9  ?HGB 13.6  ?HCT 41.1  ?PLT 302  ? ?Recent Labs  ?  01/23/22 ?[id number]  ?NA 138  ?K 4.0  ?CL 107  ?CO2 24  ?GLUCOSE 133*  ?BUN 28*  ?CREATININE 1.35*  ?CALCIUM 9.0  ? ? ?Intake/Output Summary (Last 24 hours) at 01/25/2022 1003 ?Last data filed at 01/25/2022 0701 ?Gross per 24 hour  ?Intake 660 ml  ?Output 1250 ml  ?Net -590 ml  ?  ? ?  ? ?Physical Exam: ?Vital Signs ?Blood pressure 139/64, pulse 64, temperature 98.4 ?F (36.9 ?C), resp. rate 15, height 5\' 9"  (1.753 m), weight 74.2 kg, SpO2 98 %. ?Physical Exam ?Gen: no distress, normal appearing, BMI 24.16, fatigued ?HEENT: oral mucosa pink and moist, NCAT ?Cardio: Bradycardia ?Chest: normal effort, normal rate of breathing ?Abd: soft, non-distended ?Ext: no edema, TED garments in place ?Psych: pleasant, normal affect, bright and positive ?Skin: intact ?Neurological/MSK ?   Comments: Patient is alert and oriented x3.  Makes eye contact with examiner.  Provides name and age.  Follows simple commands. 2/5 RUE with the exception of 3/5 EF. 3/5 RLE except for 0/5 EHL and DF. Sensation is intact.  ?AFO in place RLE ? ? ?Assessment/Plan: ?1. Functional deficits which require 3+ hours per day of interdisciplinary therapy in a comprehensive inpatient rehab setting. ?Physiatrist is providing close team supervision and 24 hour management of active medical problems listed below. ?Physiatrist and rehab team continue to assess barriers to discharge/monitor patient progress toward functional and medical goals ? ?Care Tool: ? ?Bathing ?   ?Body parts bathed by patient: Right arm, Face, Chest, Abdomen, Front perineal area, Buttocks, Right  upper leg, Left upper leg, Right lower leg, Left lower leg  ? Body parts bathed by helper: Left arm ?  ?  ?Bathing assist Assist Level: Minimal Assistance - Patient > 75% ?  ?  ?Upper Body Dressing/Undressing ?Upper body dressing   ?What is the patient wearing?: Pull over shirt ?   ?Upper body assist Assist Level: Minimal Assistance - Patient > 75% ?   ?Lower Body Dressing/Undressing ?Lower body dressing ? ? ?   ?What is the patient wearing?: Pants ? ?  ? ?Lower body assist Assist for lower body dressing: Minimal Assistance - Patient > 75% ?   ? ?Toileting ?Toileting Toileting Activity did not occur (Probation officer and hygiene only): N/A (no void or bm)  ?Toileting assist   ?  ?  ?Transfers ?Chair/bed transfer ? ?Transfers assist ?   ? ?Chair/bed transfer assist level: Minimal Assistance - Patient > 75% ?  ?  ?Locomotion ?Ambulation ? ? ?Ambulation assist ? ?   ? ?Assist level: Minimal Assistance - Patient > 75% ?Assistive device: Walker-rolling ?Max distance: 11ft  ? ?Walk 10 feet activity ? ? ?Assist ?   ? ?Assist level: Minimal Assistance - Patient > 75% ?Assistive device: Walker-rolling  ? ?  Walk 50 feet activity ? ? ?Assist   ? ?Assist level: Minimal Assistance - Patient > 75% ?Assistive device: Walker-rolling  ? ? ?Walk 150 feet activity ? ? ?Assist   ? ?Assist level: Minimal Assistance - Patient > 75% ?Assistive device: Walker-rolling ?  ? ?Walk 10 feet on uneven surface  ?activity ? ? ?Assist Walk 10 feet on uneven surfaces activity did not occur: Safety/medical concerns ? ? ?  ?   ? ?Wheelchair ? ? ? ? ?Assist Is the patient using a wheelchair?: No ?  ?Wheelchair activity did not occur: N/A ? ?  ?   ? ? ?Wheelchair 50 feet with 2 turns activity ? ? ? ?Assist ? ?  ?Wheelchair 50 feet with 2 turns activity did not occur: N/A ? ? ?   ? ?Wheelchair 150 feet activity  ? ? ? ?Assist ? Wheelchair 150 feet activity did not occur: N/A ? ? ?   ? ?Blood pressure 139/64, pulse 64, temperature 98.4 ?F (36.9 ?C),  resp. rate 15, height 5\' 9"  (1.753 m), weight 74.2 kg, SpO2 98 %. ? ? ? Medical Problem List and Plan: ?1. 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            -patient may shower ?            -ELOS/Goals: 2.5 weeks , d/c date 5/26 ?            -Continue CIR ?2.  Antithrombotics: ?-DVT/anticoagulation:  Pharmaceutical: Lovenox ?            -antiplatelet therapy: Aspirin 81 mg daily and Plavix 75 mg daily with initial plan x3 weeks then aspirin alone ?3. Pain Management: Tylenol as needed ?4. Mood: Provide emotional support ?            -antipsychotic agents: N/A ?5. Neuropsych: This patient is capable of making decisions on his own behalf. ?6. Skin/Wound Care: Routine skin checks ?7. Fluids/Electrolytes/Nutrition: Routine in and outs with follow-up chemistries ?8.  Chronic diastolic congestive heart failure.  Follows with Dr. Johnsie Cancel.  Continue Entresto 24-26 mg twice daily.  Monitor for any signs of fluid overload ?9.  Hypertension.Blood pressure is stable, continue Bidil 20-37.5 mg 3 times daily, Toprol 100 mg daily.  Monitor with increased mobility. Provided list of foods to help with blood pressure. Discontinued order for TEDs.  ?10.  Diabetes mellitus.  Hemoglobin A1c 7.1.  Increase Farxiga to 10 mg daily.  Diabetic teaching. Recommended avoiding added sugar. Provided education to avoid foods with added sugar. Reviewed CBGs [id number] and discussed with patient. Continue carb modified diet.  ?11.  Hyperlipidemia. continue Lipitor ?12.  CKD stage III.  Follow-up chemistries. Cr 1.35 on most recent check ?13.  Medical noncompliance.  Counseling ?14. Bradycardia: resolved, continue to monitor HR TID. Will maintain Toprol given HTN.  ?15. History of being overweight: BMI improved this hospitalization from 26.45 to 24.16. provide dietary education. Provided list of foods to assist in weight  loss.  ?16. RLE foot drop: AFO ordered and applied.  ?17. Vitamin D deficiency: continue ergocalciferol 50,000U once per week for 7 weeks. Discussed with patient and he is agreeable ?18. Poor safety awareness: continue to provide education ?19. Fatigue: discussed that Vitamin D supplement can help this. B12 is also at low end of normal, can consider supplementation if fatigue persists. ?20. History of TBI with residual cognitive deficits:  continue SLP ? ?LOS: ?3 days ?A FACE TO FACE EVALUATION WAS PERFORMED ? ?Martha Clan P Marquavius Scaife ?01/25/2022, 10:03 AM  ? ?  ?

## 2022-01-26 LAB — GLUCOSE, CAPILLARY
Glucose-Capillary: 106 mg/dL — ABNORMAL HIGH (ref 70–99)
Glucose-Capillary: 125 mg/dL — ABNORMAL HIGH (ref 70–99)
Glucose-Capillary: 102 mg/dL — ABNORMAL HIGH (ref 70–99)
Glucose-Capillary: 163 mg/dL — ABNORMAL HIGH (ref 70–99)

## 2022-01-26 MED ORDER — ISOSORB DINITRATE-HYDRALAZINE 20-37.5 MG PO TABS
2.0000 | ORAL_TABLET | Freq: Three times a day (TID) | ORAL | Status: DC
  Administered 2022-01-26 – 2022-02-03 (×25): 2 via ORAL
  Filled 2022-01-26 (×27): qty 2

## 2022-01-26 MED ORDER — CYANOCOBALAMIN 1000 MCG/ML IJ SOLN
1000.0000 ug | Freq: Once | INTRAMUSCULAR | Status: AC
  Administered 2022-01-26: 1000 ug via INTRAMUSCULAR
  Filled 2022-01-26: qty 1

## 2022-01-26 NOTE — Progress Notes (Signed)
Physical Therapy Session Note ? ?Patient Details  ?Name: Joseph Patrick ?MRN: [id number] ?Date of Birth: 1946/07/08 ? ?Today's Date: 01/26/2022 ?PT Individual Time: 1130-1157 + 1345-1530 ?PT Individual Time Calculation (min): 27 min  + 45 min ? ?Short Term Goals: ?Week 1:  PT Short Term Goal 1 (Week 1): Pt will complete bed<>chair transfers with CGA and LRAD ?PT Short Term Goal 2 (Week 1): Pt will ambulate 168f with CGA and LRAD ?PT Short Term Goal 3 (Week 1): Pt will complete functional outcome measure to assess falls risk ? ?Skilled Therapeutic Interventions/Progress Updates:  ?   ?1st session: ?Pt sitting in w/c to start - agreeable to PT tx and denies pain. Pt with AFO on R foot to start. Transported to main rehab gym for time management. Stand<>Pivot transfer with no AD and CGA to mat table. Worked on RLE NMR in gym. Sit<>stands holding 3lb med ball - 2x15 - mirror for visual feedback as pt favors his L side and over compensates with limited weight bearing through paretic RLE. Forward/backward stepping over 4inch barrier unsuccessful due to inadequate knee flexion - required 2inch barrier to be successful and using back of arm chair for LUE support - 2x20 reps. Gait training ~3012fwith CGA and RW - hip hiking on R with mild R circumduction to compensate for hip/knee flexor weakness - continue to recommend toe cap and AFO for knee stability (prevent genu recurvatum) and foot drop. Concluded session seated EOB with bed alarm on, call bell in lap, all needs met.  ? ?2nd session: ?Pt supine in bed to start. Bed mobility completed mod I with hospital bed features. Pt with his AFO and tennis shoes on already. Sit<>stand to RW with CGA - asked pt if he needed to toilet prior to leaving his room and he declined. Gait in hallway ~7566fith CGA and RW but then pt reporting need to void - returned to his room with CGA and RW and pt continent of bladder while standing - VC required for safety awareness with RW management  while trying to void. Gait ~125f74fth CGA and RW to ortho rehab gym - intermittent toe drag and continuing to compensate with hip hiking, mild circumduction, and vaulting on L. NMR for RLE strengthening while supine on mat table: ?-3x15 heel slides without resistance ?-3x15 heel slides with manual resistance  ?-3x15 heel slides with therapy ball ?-3x15 bridges ?-3x15 bridges with bias towards RLE ?*NMR targeting hip/knee flexors to carryover into functional gait. ? ?Sit<>stand to RW with CGA - ambulated ~150ft49fk to his room with CGA and RW, shoe cover on R foot to manage toe catching during swing phase. Concluded session seated EOB with bed alarm on, all needs met, call bell in reach.  ? ?Therapy Documentation ?Precautions:  ?Precautions ?Precautions: Fall ?Precaution Comments: R hemiparesis and foot drop, decreased insight and questionable safety awareness ?Restrictions ?Weight Bearing Restrictions: No ?General: ?  ? ?Therapy/Group: Individual Therapy ? ?ChrisAlger Simons5/08/2022, 7:29 AM  ?

## 2022-01-26 NOTE — Progress Notes (Addendum)
Speech Language Pathology Discharge Summary ? ?Patient Details  ?Name: Joseph Patrick ?MRN: [id number] ?Date of Birth: May 29, 1946 ? ?Today's Date: 01/26/2022 ?SLP Individual Time: 0930-1030 ?SLP Individual Time Calculation (min): 60 min ? ? ?Skilled Therapeutic Interventions:   ?Pt seen this date for skilled ST intervention targeting cognitive-linguistic goals outlined in care plan. Direct hand off from PT. No c/o pain. Agreeable to ST intervention. ? ?Pt completed ALFA sub-tests targeting counting money and reading Rx labels. Pt with 100% accuracy with counting money subtest given Mod I though required Sup to Min A for completion of reading Rx labels; at baseline pt completed medication management tasks with his niece, and confirms she will continue to help him after discharge. Completed functional, verbal problem-solving task with overall Mod I. Continued to provide education and reinforcement of fall precautions, which pt verbalized understanding to. Recalled 2 out of 4 compensatory memory strategies with Sup A. ? ?Appears to be at his cognitive baseline at this point in time and can verbalize consequences if he were to not adhere to fall and safety precautions. SLP called pt's niece and provided education and recommendations following today's session; please see below. ? ?Pt left in w/c with safety belt donned, call bell within reach, and all immediate needs met. Discharge from [place] intervention. Please see below for details. ? ?Patient has met 2 of 2 long term goals.  Patient to discharge at overall Modified Independent level.  ?Reasons goals not met: N/A  ? ?Clinical Impression/Discharge Summary: Pt has met 2 out of 2 long-term goals set in care plan; therefore, recommend d/c from ST intervention. Pt and family education completed re: need for full assistance with all iADL tasks to include financial + medication management, cooking, and driving; all parties verbalized understanding and all questions answered. Niece  notified of recommendations via phone conversation. Pt continues to present with subtle cognitive-linguistic deficits, though appears to be at baseline per d/w pt and his niece. No post-discharge f/u ST needs identified at this time. ? ?Care Partner:  ?Caregiver Able to Provide Assistance: Yes with iADL tasks  ?Type of Caregiver Assistance: Cognitive ? ?Recommendation:  ?Other (comment) (intermittent supervision with iADL completion)  ?   ?Equipment: N/A  ? ?Reasons for discharge: Treatment goals met  ? ?Patient/Family Agrees with Progress Made and Goals Achieved: Yes  ? ? ?Zanaria Morell A Capitola Ladson ?01/26/2022, 1:05 PM ? ?

## 2022-01-26 NOTE — Progress Notes (Signed)
Physical Therapy Session Note ? ?Patient Details  ?Name: Joseph Patrick ?MRN: [id number] ?Date of Birth: 04-12-1946 ? ?Today's Date: 01/26/2022 ?PT Individual Time: 9407-6808 ?PT Individual Time Calculation (min): 27 min  ? ?Short Term Goals: ?Week 1:  PT Short Term Goal 1 (Week 1): Pt will complete bed<>chair transfers with CGA and LRAD ?PT Short Term Goal 2 (Week 1): Pt will ambulate 166f with CGA and LRAD ?PT Short Term Goal 3 (Week 1): Pt will complete functional outcome measure to assess falls risk ? ?Skilled Therapeutic Interventions/Progress Updates:  ? ?Pt received sitting in WC and agreeable to PT. Pt performed gait training with RW x 2033fwith min assist from PT with cues for step length/ height to improve heel contact on the R side. Dynamic gait training in parallel bars forward revrese 105f 4 with min assist and cues for HS activation with posterior movement. Side stepping R and L 3x 8ft80fth min assist with cues for posture and edcreaed pelvic rotation to compensate. Foot tap on 4inch step x 8 BLE with min assist for improved HS and hip flexor activation. Patient returned to room and [facility] reach and all needs met.   ? ? ?   ? ?Therapy Documentation ?Precautions:  ?Precautions ?Precautions: Fall ?Precaution Comments: R hemiparesis and foot drop, decreased insight and questionable safety awareness ?Restrictions ?Weight Bearing Restrictions: No ? ? ?Pain: ? denies ? ? ?Therapy/Group: Individual Therapy ? ?AustLorie Phenix12/2023, 9:36 AM  ?

## 2022-01-26 NOTE — Plan of Care (Signed)
?  Problem: RH Memory ?Goal: LTG Patient will use memory compensatory aids to (SLP) ?Description: LTG:  Patient will use memory compensatory aids to recall biographical/new, daily complex information with cues (SLP) ?Outcome: Completed/Met ?Flowsheets (Taken 01/26/2022 1346) ?LTG: Patient will use memory compensatory aids to (SLP): Modified Independent ?  ?Problem: RH Awareness ?Goal: LTG: Patient will demonstrate awareness during functional activites type of (SLP) ?Description: LTG: Patient will demonstrate awareness during functional activites type of (SLP) ?Outcome: Completed/Met ?Flowsheets ?Taken 01/26/2022 1346 ?LTG: Patient will demonstrate awareness during cognitive/linguistic activities with assistance of (SLP): Modified Independent ?Taken 01/23/2022 2141 ?Patient will demonstrate during cognitive/linguistic activities awareness type of: Emergent ?  ?

## 2022-01-26 NOTE — Progress Notes (Signed)
Occupational Therapy Session Note ? ?Patient Details  ?Name: Joseph Patrick ?MRN: [id number] ?Date of Birth: 24-Sep-1945 ? ?Today's Date: 01/26/2022 ?OT Individual Time: [id number] ?OT Individual Time Calculation (min): 60 min  ? ? ?Short Term Goals: ?Week 1:  OT Short Term Goal 1 (Week 1): Pt will complete standing grooming task at sink with CGA. ?OT Short Term Goal 2 (Week 1): Pt will complete shower transfer with min A. ?OT Short Term Goal 3 (Week 1): Pt will therapeutically position RUE when seated in chair/bed with no more than min VCs. ?OT Short Term Goal 4 (Week 1): Pt will don shirt with S. ? ? ?Skilled Therapeutic Interventions/Progress Updates:  ?  Subjective: Pt states he wants to work on strengthening his arm today. No c/o pain. ?  Objective:  Pt semi reclined in bed. Supine to sit with supervision. Educated pt on donning technique of right AFO.  Pt needing mod assist to complete.  Stand pivot using RW to w/c with CGA and cues to slow down for safety awareness.  Transported pt to ortho gym and completed stand pivot to nustep with CGA.  Pt completed 10 minutes at resistance level 3 with right hand ace wrapped to handle due to pt unable to attend to gripping in sustained fashion and continuing to open hand.  Pt then ambulated to EOM about 10 feet and completed neuro re-ed RUE sitting in front of mirror.  Shoulder ER using sticky note targets on stomach and on cone to facilitate external focus.  Completed 3 x 10 reps. Stand pivot to w/c with CGA using RW and transported back to room.  Direct hand off to MD and nurse tech.   ?  Assessment:  Pt making progress evidenced by increased control during ER in gravity eliminated plane.  Primary barriers today included right neglect and distractibility. ?  Plan: Pt would benefit from further training on NMES of right wrist extensors and digital extension /flexion to facilitate functional grip.  ? ?Therapy Documentation ?Precautions:  ?Precautions ?Precautions:  Fall ?Precaution Comments: R hemiparesis and foot drop, decreased insight and questionable safety awareness ?Restrictions ?Weight Bearing Restrictions: No ? ? ? ?Therapy/Group: Individual Therapy ? ?Joseph Patrick ?01/26/2022, 1:42 PM ?

## 2022-01-26 NOTE — Progress Notes (Signed)
?                                                       PROGRESS NOTE ? ? ?Subjective/Complaints: ?Still feels fatigued ?Asks about high blood pressure ?Discussed increasing his Bidil for better BP control and he is agreeable ? ?ROS: +fatigue, denies dizziness, pain, +right sided weakness ? ? ?Objective: ?  ?No results found. ?No results for input(s): WBC, HGB, HCT, PLT in the last 72 hours. ? ?No results for input(s): NA, K, CL, CO2, GLUCOSE, BUN, CREATININE, CALCIUM in the last 72 hours. ? ? ?Intake/Output Summary (Last 24 hours) at 01/26/2022 1158 ?Last data filed at 01/26/2022 0753 ?Gross per 24 hour  ?Intake 840 ml  ?Output 1250 ml  ?Net -410 ml  ?  ? ?  ? ?Physical Exam: ?Vital Signs ?Blood pressure (!) 150/72, pulse 72, temperature 98.4 ?F (36.9 ?C), resp. rate 18, height 5\' 9"  (1.753 m), weight 74.2 kg, SpO2 99 %. ?Physical Exam ?Gen: no distress, normal appearing, BMI 24.16, fatigued ?HEENT: oral mucosa pink and moist, NCAT ?Cardio: Bradycardia ?Chest: normal effort, normal rate of breathing ?Abd: soft, non-distended ?Ext: no edema, TED garments in place ?Psych: pleasant, normal affect, bright and positive ?Skin: intact ?Neurological/MSK ?   Comments: Patient is alert and oriented x3.  Makes eye contact with examiner.  Provides name and age.  Follows simple commands. 2/5 RUE with the exception of 3/5 EF. 3/5 RLE except for 0/5 EHL and DF. Sensation is intact.  ?AFO in place RLE ?Decreased insight into deficits ? ? ?Assessment/Plan: ?1. Functional deficits which require 3+ hours per day of interdisciplinary therapy in a comprehensive inpatient rehab setting. ?Physiatrist is providing close team supervision and 24 hour management of active medical problems listed below. ?Physiatrist and rehab team continue to assess barriers to discharge/monitor patient progress toward functional and medical goals ? ?Care Tool: ? ?Bathing ?   ?Body parts bathed by patient: Right arm, Face, Chest, Abdomen, Front perineal area,  Buttocks, Right upper leg, Left upper leg, Right lower leg, Left lower leg  ? Body parts bathed by helper: Left arm ?  ?  ?Bathing assist Assist Level: Minimal Assistance - Patient > 75% ?  ?  ?Upper Body Dressing/Undressing ?Upper body dressing   ?What is the patient wearing?: Pull over shirt ?   ?Upper body assist Assist Level: Minimal Assistance - Patient > 75% ?   ?Lower Body Dressing/Undressing ?Lower body dressing ? ? ?   ?What is the patient wearing?: Pants ? ?  ? ?Lower body assist Assist for lower body dressing: Minimal Assistance - Patient > 75% ?   ? ?Toileting ?Toileting Toileting Activity did not occur (Probation officer and hygiene only): N/A (no void or bm)  ?Toileting assist Assist for toileting: Independent with assistive device ?Assistive Device Comment: Urinal ?  ?Transfers ?Chair/bed transfer ? ?Transfers assist ?   ? ?Chair/bed transfer assist level: Minimal Assistance - Patient > 75% ?  ?  ?Locomotion ?Ambulation ? ? ?Ambulation assist ? ?   ? ?Assist level: Minimal Assistance - Patient > 75% ?Assistive device: Walker-rolling ?Max distance: 135ft  ? ?Walk 10 feet activity ? ? ?Assist ?   ? ?Assist level: Minimal Assistance - Patient > 75% ?Assistive device: Walker-rolling  ? ?Walk 50 feet activity ? ? ?Assist   ? ?  Assist level: Minimal Assistance - Patient > 75% ?Assistive device: Walker-rolling  ? ? ?Walk 150 feet activity ? ? ?Assist   ? ?Assist level: Minimal Assistance - Patient > 75% ?Assistive device: Walker-rolling ?  ? ?Walk 10 feet on uneven surface  ?activity ? ? ?Assist Walk 10 feet on uneven surfaces activity did not occur: Safety/medical concerns ? ? ?  ?   ? ?Wheelchair ? ? ? ? ?Assist Is the patient using a wheelchair?: No ?  ?Wheelchair activity did not occur: N/A ? ?  ?   ? ? ?Wheelchair 50 feet with 2 turns activity ? ? ? ?Assist ? ?  ?Wheelchair 50 feet with 2 turns activity did not occur: N/A ? ? ?   ? ?Wheelchair 150 feet activity  ? ? ? ?Assist ? Wheelchair 150 feet  activity did not occur: N/A ? ? ?   ? ?Blood pressure (!) 150/72, pulse 72, temperature 98.4 ?F (36.9 ?C), resp. rate 18, height 5\' 9"  (1.753 m), weight 74.2 kg, SpO2 99 %. ? ? ? Medical Problem List and Plan: ?1. 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            -patient may shower ?            -ELOS/Goals: 2.5 weeks , d/c date 5/26 ?            -Continue CIR ?2.  Antithrombotics: ?-DVT/anticoagulation:  Pharmaceutical: Lovenox ?            -antiplatelet therapy: Aspirin 81 mg daily and Plavix 75 mg daily with initial plan x3 weeks then aspirin alone ?3. Pain Management: Tylenol as needed ?4. Mood: Provide emotional support ?            -antipsychotic agents: N/A ?5. Neuropsych: This patient is capable of making decisions on his own behalf. ?6. Skin/Wound Care: Routine skin checks ?7. Fluids/Electrolytes/Nutrition: Routine in and outs with follow-up chemistries ?8.  Chronic diastolic congestive heart failure.  Follows with Dr. Johnsie Cancel.  Continue Entresto 24-26 mg twice daily.  Monitor for any signs of fluid overload ?9.  Hypertension.Increase Bidil to tabs of 20-37.5 mg 3 times daily, Toprol 100 mg daily.  Monitor with increased mobility. Provided list of foods to help with blood pressure. Discontinued order for TEDs.  ?10.  Diabetes mellitus.  Hemoglobin A1c 7.1.  Increase Farxiga to 10 mg daily.  Diabetic teaching. Recommended avoiding added sugar. Provided education to avoid foods with added sugar. Reviewed CBGs [id number] and discussed with patient. Continue carb modified diet.  ?11.  Hyperlipidemia. continue Lipitor ?12.  CKD stage III.  Follow-up chemistries. Cr 1.35 on most recent check ?13.  Medical noncompliance.  Counseling ?14. Bradycardia: resolved, continue to monitor HR TID. Will maintain Toprol given HTN.  ?15. History of being overweight: BMI improved this hospitalization  from 26.45 to 24.16. provide dietary education. Provided list of foods to assist in weight loss.  ?16. RLE foot drop: AFO ordered and applied.  ?17. Vitamin D deficiency: continue ergocalciferol 50,000U once per week for 7 weeks. Discussed with patient and he is agreeable ?18. Poor safety awareness: continue to provide education ?19. Fatigue: discussed that Vitamin D supplement can help this. B12 is also at low end of normal, 1,000mg  injected today.  ?20. 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 Impulsivity: continue to reinforce importance of safety precautions to  avoid falls ? ?LOS: ?4 days ?A FACE TO FACE EVALUATION WAS PERFORMED ? ?Martha Clan P Audon Heymann ?01/26/2022, 11:58 AM  ? ?  ?

## 2022-01-27 LAB — GLUCOSE, CAPILLARY
Glucose-Capillary: 107 mg/dL — ABNORMAL HIGH (ref 70–99)
Glucose-Capillary: 156 mg/dL — ABNORMAL HIGH (ref 70–99)
Glucose-Capillary: 118 mg/dL — ABNORMAL HIGH (ref 70–99)
Glucose-Capillary: 207 mg/dL — ABNORMAL HIGH (ref 70–99)

## 2022-01-27 NOTE — Progress Notes (Signed)
?                                                       PROGRESS NOTE ? ? ?Subjective/Complaints: ? ?Patient reports still feeling fatigued during his hospitalization.  He has stated BiDil for better B/P control yesterday and reports no issues. Reports that he is working to increase strength on the right side of his body. ? ?ROS: +fatigue, +right sided weakness, negative for dizziness or pain. ? ?Objective: ?  ?No results found. ?No results for input(s): WBC, HGB, HCT, PLT in the last 72 hours. ? ?No results for input(s): NA, K, CL, CO2, GLUCOSE, BUN, CREATININE, CALCIUM in the last 72 hours. ? ? ?Intake/Output Summary (Last 24 hours) at 01/27/2022 1348 ?Last data filed at 01/27/2022 1325 ?Gross per 24 hour  ?Intake 537 ml  ?Output 1750 ml  ?Net -1213 ml  ?  ? ?  ? ?Physical Exam: ?Vital Signs ?Blood pressure (!) 134/58, pulse (!) 57, temperature 97.7 ?F (36.5 ?C), resp. rate 16, height 5\' 9"  (1.753 m), weight 74.2 kg, SpO2 99 %. ?Physical Exam ?Gen: no distress, normal appearing, BMI 24.16, fatigued (generalized) ?HEENT: oral mucosa pink and moist, NCAT ?Cardio: Bradycardia ?Chest: normal effort, normal rate of breathing ?Abd: soft, non-distended ?Ext: no edema, TED garments in place ?Psych: pleasant, normal affect and behavior ? ?Skin: intact ?Neurological/MSK ?   Comments: Patient is alert and oriented x3. Able to converse well and can follow basic commands. ?Motor strength: 2/5 RUE with the exception of 3/5 EF. 3/5 RLE except for 0/5 EHL and DF. Sensation is intact.  ?AFO in place RLE. Decreased insight into deficits ? ? ?Assessment/Plan: ?1. Functional deficits which require 3+ hours per day of interdisciplinary therapy in a comprehensive inpatient rehab setting. ?Physiatrist is providing close team supervision and 24 hour management of active medical problems listed below. ?Physiatrist and rehab team continue to assess barriers to discharge/monitor patient progress toward functional and medical goals ? ?Care  Tool: ? ?Bathing ?   ?Body parts bathed by patient: Right arm, Face, Chest, Abdomen, Front perineal area, Buttocks, Right upper leg, Left upper leg, Right lower leg, Left lower leg  ? Body parts bathed by helper: Left arm ?  ?  ?Bathing assist Assist Level: Minimal Assistance - Patient > 75% ?  ?  ?Upper Body Dressing/Undressing ?Upper body dressing   ?What is the patient wearing?: Pull over shirt ?   ?Upper body assist Assist Level: Minimal Assistance - Patient > 75% ?   ?Lower Body Dressing/Undressing ?Lower body dressing ? ? ?   ?What is the patient wearing?: Pants ? ?  ? ?Lower body assist Assist for lower body dressing: Minimal Assistance - Patient > 75% ?   ? ?Toileting ?Toileting Toileting Activity did not occur (Probation officer and hygiene only): N/A (no void or bm)  ?Toileting assist Assist for toileting: Independent with assistive device ?Assistive Device Comment: Urinal ?  ?Transfers ?Chair/bed transfer ? ?Transfers assist ?   ? ?Chair/bed transfer assist level: Supervision/Verbal cueing ?  ?  ?Locomotion ?Ambulation ? ? ?Ambulation assist ? ?   ? ?Assist level: Contact Guard/Touching assist ?Assistive device: Walker-rolling (RAFO) ?Max distance: 90  ? ?Walk 10 feet activity ? ? ?Assist ?   ? ?Assist level: Contact Guard/Touching assist ?Assistive device: Walker-rolling  ? ?Walk  50 feet activity ? ? ?Assist   ? ?Assist level: Contact Guard/Touching assist ?Assistive device: Walker-rolling  ? ? ?Walk 150 feet activity ? ? ?Assist   ? ?Assist level: Minimal Assistance - Patient > 75% ?Assistive device: Walker-rolling ?  ? ?Walk 10 feet on uneven surface  ?activity ? ? ?Assist Walk 10 feet on uneven surfaces activity did not occur: Safety/medical concerns ? ? ?  ?   ? ?Wheelchair ? ? ? ? ?Assist Is the patient using a wheelchair?: Yes ?Type of Wheelchair: Manual ?Wheelchair activity did not occur: N/A ? ?Wheelchair assist level: Supervision/Verbal cueing ?Max wheelchair distance: 25  ? ? ?Wheelchair 50  feet with 2 turns activity ? ? ? ?Assist ? ?  ?Wheelchair 50 feet with 2 turns activity did not occur: N/A ? ? ?   ? ?Wheelchair 150 feet activity  ? ? ? ?Assist ? Wheelchair 150 feet activity did not occur: N/A ? ? ?   ? ?Blood pressure (!) 134/58, pulse (!) 57, temperature 97.7 ?F (36.5 ?C), resp. rate 16, height 5\' 9"  (1.753 m), weight 74.2 kg, SpO2 99 %. ? ? ? Medical Problem List and Plan: ?1. 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            -patient may shower ?            -ELOS/Goals: 2.5 weeks , d/c date 5/26 ?            -Continue CIR ?2.  Antithrombotics: ?-DVT/anticoagulation:  Pharmaceutical: Lovenox ?            -antiplatelet therapy: Aspirin 81 mg daily and Plavix 75 mg daily with initial plan x3 weeks then aspirin alone ?3. Pain Management: Tylenol as needed ?4. Mood: Provide emotional support ?            -antipsychotic agents: N/A ?5. Neuropsych: This patient is capable of making decisions on his own behalf. ?6. Skin/Wound Care: Routine skin checks ?7. Fluids/Electrolytes/Nutrition: Routine in and outs with follow-up chemistries ?8.  Chronic diastolic congestive heart failure.  Follows with Dr. Johnsie Cancel.  Continue Entresto 24-26 mg twice daily.  Monitor for any signs of fluid overload ?9.  Hypertension.Increase Bidil to tabs of 20-37.5 mg 3 times daily, Toprol 100 mg daily.  Monitor with increased mobility. Provided list of foods to help with blood pressure. Discontinued order for TEDs.  ?10.  Diabetes mellitus.  Hemoglobin A1c 7.1.  Increase Farxiga to 10 mg daily.  Diabetic teaching. Recommended avoiding added sugar. Provided education to avoid foods with added sugar. Reviewed CBGs [id number] and discussed with patient. Continue carb modified diet.  ?5/13 CBG's today range from 107-163.  Continue Farxiga 10 mg daily. ? ?CBG (last 3)  ?Recent Labs  ?  01/26/22 ?2106  01/27/22 ?0629 01/27/22 ?1136  ?GLUCAP 163* 107* 156*  ? ?11.  Hyperlipidemia. continue Lipitor ?12.  CKD stage III.  Follow-up chemistries. Cr 1.35 on most recent check ?13.  Medical noncompliance.  Counseling ?14. Bradycardia: resolved, continue to monitor HR TID. Will maintain Toprol given HTN.  ?5/13 Still some mild bradycardia, asymptomatic HR ~57-58, CTM. ? ? ?  01/27/2022  ? 12:59 PM 01/27/2022  ?  6:24 AM 01/26/2022  ?  8:30 PM  ?Vitals with BMI  ?Systolic Q000111Q [id number]  ?Diastolic 58 72 65  ?Pulse 57 63 55  ?  ?15. History of being  overweight: BMI improved this hospitalization from 26.45 to 24.16. provide dietary education. Provided list of foods to assist in weight loss.  ?16. RLE foot drop: AFO ordered and applied.  ?5/13 Continue use of AFO per PT. ?17. Vitamin D deficiency: continue ergocalciferol 50,000U once per week for 7 weeks. Discussed with patient and he is agreeable ?5/13 Continue supplementing with ergocalciferol 50,000 U once a week for 7 weeks. ?18. Poor safety awareness: continue to provide education ?5/13 Continue to provide education. ?19. Fatigue: discussed that Vitamin D supplement can help this. B12 is also at low end of normal, 1,000mg  injected today (5/12) ?5/13 Could be due to low Vitamin D level as contributing factor. CTM labs. ?20. 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 Impulsivity: continue to reinforce importance of safety precautions to avoid falls ?5/13 Continue bed alarm and safety measures. ? ?LOS: ?5 days ?A FACE TO FACE EVALUATION WAS PERFORMED ? ?Luetta Nutting ?01/27/2022, 1:48 PM  ? ?  ?

## 2022-01-27 NOTE — Progress Notes (Signed)
Physical Therapy Session Note ? ?Patient Details  ?Name: Joseph Patrick ?MRN: [id number] ?Date of Birth: [birth year] ? ?Today's Date: 01/27/2022 ?PT Individual Time:0800-0856,  1030-1130 ?PT Individual Time Calculation (min): 56, 60 min  ? ?Short Term Goals: ?Week 1:  PT Short Term Goal 1 (Week 1): Pt will complete bed<>chair transfers with CGA and LRAD ?PT Short Term Goal 2 (Week 1): Pt will ambulate 140ft with CGA and LRAD ?PT Short Term Goal 3 (Week 1): Pt will complete functional outcome measure to assess falls risk ?   ? ?Skilled Therapeutic Interventions/Progress Updates:  ?Tx 1: ? ?Pt seated EOB.  He denied pain.  Pt c/o of increased weakness RLE and RUE.  MD aware.  Pt reported that he worked really hard in tx yesterday, and feels that that might be the issue. ? ?Pt donned RAFO in shoe with max assist.  He donned R shoe with assistance for laces. ? ?Sit> stand with close supervision, with retrograde pushing against bed.  Stand pivot with RW to wc with close supervision. ? ?neuromuscular re-education via forced use, multimodal cues, demo, using KInetron at level 50 cm/sec, x 2 min using bil LEs, and x 20 cycles x 2 using RLE only, PT providing resistance through other foot plate.  Wc propulsion using bil LEs x 25' to activate R hamstrings, with supervision, and great difficulty.  ? ?Gait training over level tile, RW, with LAFO, shoe wedge, shoe cover, x 90' x 2.  Cues for upright posture and forward gaze. No R knee hyperextension noted, but hip weakness evident by difficulty with foot placement. PT placed heel lift in L shoe to improve clearance of RLE, with good results.. Pt plans to ask family to bring larger shoes tomorrow for ease of donning. ? ?Stand pivot transfer to sit EOB, close supervision.  Bed alarm set and needs left at hand. ? ?Tx 2: ? ?Pt seated EOB.  He denied pain.  Stand pivot transfer without AD, CGa and mod cues. ? ?neuromuscular re-education during therapeutic activities, via demo, multimodal  cues for trunk shortening/lengthening/rotating in sitting , reaching to L out of BOS with L hand to shorten R trunk, and reaching under L hip with L hand to retrieve small items to lengthen R trunk. Reciprocal scooting with manual cues to facilitate R pelvic protraction.  Sit>< stand biased to R x 5 throughout session for better loading of RLE, no use of bil UEs. With gloves donned by PT, use of R hand as gross assist on table in front of him, while wiping down equipment with L hand ? ?Sustained stretch bil heel cords and hamstrings, and balance challenge, standing with forefeet on blue wedge, with bil UE support> 0UE support, x 2 minutes without LOB.  Pt felt stretch more in LLE than RLE.  ? ?Niece brought in new larger shoes.  They fit better with RAFO, but pt still needed max assist to don shoe with AFO in it. ? ?Gait training without heel lift , x 150' with CGA/supervision, Rw.  Pt demonstrated better trunk activation, less R hip retraction, and better R foot placement this session.  ? ?At end of session, pt seated EOB with needs at hand and bed alarm set. ? ?   ? ?Therapy Documentation ?Precautions:  ?Precautions ?Precautions: Fall ?Precaution Comments: R hemiparesis and foot drop, decreased insight and questionable safety awareness ?Restrictions ?Weight Bearing Restrictions: No ?   ? ? ? ?Therapy/Group: Individual Therapy ? ?Teri Legacy ?01/27/2022, 12:32 PM  ?

## 2022-01-27 NOTE — Progress Notes (Signed)
Occupational Therapy Session Note ? ?Patient Details  ?Name: Joseph Patrick ?MRN: [id number] ?Date of Birth: 09-Apr-1946 ? ?Today's Date: 01/27/2022 ?OT Individual Time: 0354-6568 ?OT Individual Time Calculation (min): 75 min  ? ? ?Short Term Goals: ?Week 1:  OT Short Term Goal 1 (Week 1): Pt will complete standing grooming task at sink with CGA. ?OT Short Term Goal 2 (Week 1): Pt will complete shower transfer with min A. ?OT Short Term Goal 3 (Week 1): Pt will therapeutically position RUE when seated in chair/bed with no more than min VCs. ?OT Short Term Goal 4 (Week 1): Pt will don shirt with S. ? ? ?Therapy Documentation ?Precautions:  ?Precautions ?Precautions: Fall ?Precaution Comments: R hemiparesis and foot drop, decreased insight and questionable safety awareness ?Restrictions ?Weight Bearing Restrictions: No ?General: ?  ?Vital Signs: ?Therapy Vitals ?Temp: 97.7 ?F (36.5 ?C) ?Pulse Rate: (!) 57 ?Resp: 16 ?BP: (!) 134/58 ?Patient Position (if appropriate): Lying ?Oxygen Therapy ?SpO2: 99 % ?O2 Device: Room Air ?Pain: No c/o pain ?  ?Skilled Therapeutic Interventions/Progress Updates: Patient received resting in bed. Patient agreeable and highly motivated to work with OT. Patient able to roll and get to sitting at EOB with close SBA. Patient able to propell w/c 50+feet to gym for neuro reeducation activities focusing on controlled movements of the RUE. Started with AAROM functional reach along table top to decrease proximal strength required. Able to activate at pecs to initiate the movement and with muscle facilitation able to continue the motion through triceps activation. Continued with horizontal abduction along table top. Continue with facilitation of supinators supported. Patient able to reduce support to only distal contact for forward reach and IR/ER movements supported at the hand. Patient was able to perform sets in repetitions of 3-5 for optimal muscle activation prior to decreased AROM with fatigue.  Following reaching and weightbearing pushing task patient able to activate wrist extensors and lateral pinch. Patient very motivated with progress and reports desire to continue with many exercises adapted to be performed in the room. Patient able to propel w/c back to room SPV. Returned to bed with SBA/CGA due to general fatigue and crossing of LE's during step while transferring out of the w/c. ?Continue with skilled OT treatment and POC. ?   ?   ? ? ?Therapy/Group: Individual Therapy ? ?Warnell Forester ?01/27/2022, 3:37 PM ?

## 2022-01-28 LAB — GLUCOSE, CAPILLARY
Glucose-Capillary: 104 mg/dL — ABNORMAL HIGH (ref 70–99)
Glucose-Capillary: 122 mg/dL — ABNORMAL HIGH (ref 70–99)
Glucose-Capillary: 144 mg/dL — ABNORMAL HIGH (ref 70–99)
Glucose-Capillary: 141 mg/dL — ABNORMAL HIGH (ref 70–99)

## 2022-01-28 NOTE — Progress Notes (Signed)
?                                                       PROGRESS NOTE ? ? ?Subjective/Complaints: ? ?Patient reports still feeling fatigued during his hospitalization.  He has started BiDil for better B/P control on Friday and reports no issues. Reports that he is working to increase strength on the right side of his body. He is making strides on use of his right leg and fingers of right hand. ? ?ROS: +fatigue, +right sided weakness, negative for dizziness or pain. ? ?Objective: ?  ?No results found. ?No results for input(s): WBC, HGB, HCT, PLT in the last 72 hours. ? ?No results for input(s): NA, K, CL, CO2, GLUCOSE, BUN, CREATININE, CALCIUM in the last 72 hours. ? ? ?Intake/Output Summary (Last 24 hours) at 01/28/2022 1322 ?Last data filed at 01/28/2022 1130 ?Gross per 24 hour  ?Intake 660 ml  ?Output 1050 ml  ?Net -390 ml  ?  ? ?  ? ?Physical Exam: ?Vital Signs ?Blood pressure (!) 147/59, pulse (!) 58, temperature 98.1 ?F (36.7 ?C), resp. rate 16, height 5\' 9"  (1.753 m), weight 74.2 kg, SpO2 98 %. ?Physical Exam ?Gen: no distress, normal appearing, BMI 24.16, fatigued (generalized) ?HEENT: oral mucosa pink and moist, NCAT ?Cardio: Bradycardia ?Chest: normal effort, normal rate of breathing ?Abd: soft, non-distended ?Ext: no edema, TED garments in place ?Psych: pleasant, normal affect and behavior ? ?Skin: intact ?Neurological/MSK ?   Comments: Patient is alert and oriented x3. Able to converse well and can follow basic commands. ?Motor strength: 2/5 RUE with the exception of 3/5 EF. 3/5 RLE except for 0/5 EHL and DF. Sensation is intact. Slow to move fingers of right hand. ?AFO in place RLE. Decreased insight into deficits ? ? ?Assessment/Plan: ?1. Functional deficits which require 3+ hours per day of interdisciplinary therapy in a comprehensive inpatient rehab setting. ?Physiatrist is providing close team supervision and 24 hour management of active medical problems listed below. ?Physiatrist and rehab team  continue to assess barriers to discharge/monitor patient progress toward functional and medical goals ? ?Care Tool: ? ?Bathing ?   ?Body parts bathed by patient: Right arm, Face, Chest, Abdomen, Front perineal area, Buttocks, Right upper leg, Left upper leg, Right lower leg, Left lower leg  ? Body parts bathed by helper: Left arm ?  ?  ?Bathing assist Assist Level: Minimal Assistance - Patient > 75% ?  ?  ?Upper Body Dressing/Undressing ?Upper body dressing   ?What is the patient wearing?: Pull over shirt ?   ?Upper body assist Assist Level: Minimal Assistance - Patient > 75% ?   ?Lower Body Dressing/Undressing ?Lower body dressing ? ? ?   ?What is the patient wearing?: Pants ? ?  ? ?Lower body assist Assist for lower body dressing: Minimal Assistance - Patient > 75% ?   ? ?Toileting ?Toileting Toileting Activity did not occur (Probation officer and hygiene only): N/A (no void or bm)  ?Toileting assist Assist for toileting: Independent with assistive device ?Assistive Device Comment: Urinal ?  ?Transfers ?Chair/bed transfer ? ?Transfers assist ?   ? ?Chair/bed transfer assist level: Contact Guard/Touching assist ?  ?  ?Locomotion ?Ambulation ? ? ?Ambulation assist ? ?   ? ?Assist level: Contact Guard/Touching assist ?Assistive device: Walker-rolling (RAFO) ?Max distance: 90  ? ?  Walk 10 feet activity ? ? ?Assist ?   ? ?Assist level: Contact Guard/Touching assist ?Assistive device: Walker-rolling  ? ?Walk 50 feet activity ? ? ?Assist   ? ?Assist level: Contact Guard/Touching assist ?Assistive device: Walker-rolling  ? ? ?Walk 150 feet activity ? ? ?Assist   ? ?Assist level: Minimal Assistance - Patient > 75% ?Assistive device: Walker-rolling ?  ? ?Walk 10 feet on uneven surface  ?activity ? ? ?Assist Walk 10 feet on uneven surfaces activity did not occur: Safety/medical concerns ? ? ?  ?   ? ?Wheelchair ? ? ? ? ?Assist Is the patient using a wheelchair?: Yes ?Type of Wheelchair: Manual ?Wheelchair activity did not  occur: N/A ? ?Wheelchair assist level: Supervision/Verbal cueing ?Max wheelchair distance: 25  ? ? ?Wheelchair 50 feet with 2 turns activity ? ? ? ?Assist ? ?  ?Wheelchair 50 feet with 2 turns activity did not occur: N/A ? ? ?   ? ?Wheelchair 150 feet activity  ? ? ? ?Assist ? Wheelchair 150 feet activity did not occur: N/A ? ? ?   ? ?Blood pressure (!) 147/59, pulse (!) 58, temperature 98.1 ?F (36.7 ?C), resp. rate 16, height 5\' 9"  (1.753 m), weight 74.2 kg, SpO2 98 %. ? ? ? Medical Problem List and Plan: ?1. 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            -patient may shower ?            -ELOS/Goals: 2.5 weeks , d/c date 5/26 ?            -Continue CIR ?5/14 Continues to do exercises recommended by PT/OT including hand exercises. ?2.  Antithrombotics: ?-DVT/anticoagulation:  Pharmaceutical: Lovenox ?            -antiplatelet therapy: Aspirin 81 mg daily and Plavix 75 mg daily with initial plan x3 weeks then aspirin alone ?3. Pain Management: Tylenol as needed ?4. Mood: Provide emotional support ?            -antipsychotic agents: N/A ?5. Neuropsych: This patient is capable of making decisions on his own behalf. ?6. Skin/Wound Care: Routine skin checks ?7. Fluids/Electrolytes/Nutrition: Routine in and outs with follow-up chemistries ?8.  Chronic diastolic congestive heart failure.  Follows with Dr. Johnsie Cancel.  Continue Entresto 24-26 mg twice daily.  Monitor for any signs of fluid overload ?9.  Hypertension.Increase Bidil to tabs of 20-37.5 mg 3 times daily, Toprol 100 mg daily.  Monitor with increased mobility. Provided list of foods to help with blood pressure. Discontinued order for TEDs.  ?10.  Diabetes mellitus.  Hemoglobin A1c 7.1.  Increase Farxiga to 10 mg daily.  Diabetic teaching. Recommended avoiding added sugar. Provided education to avoid foods with added sugar. Reviewed  CBGs [id number] and discussed with patient. Continue carb modified diet.  ?5/13 CBG's today ranges from 107-163.  Continue Farxiga 10 mg daily. ?5/14 CBG's today ranges from 104-207.  Continue Farxiga 10 mg daily. ? ?CBG (last 3)  ?Recent Labs  ?  01/27/22 ?2109 01/28/22 ?0628 01/28/22 ?1120  ?GLUCAP 207* 104* 122*  ? ?11.  Hyperlipidemia. continue Lipitor ?12.  CKD stage III.  Follow-up chemistries. Cr 1.35 on most recent check ?13.  Medical noncompliance.  Counseling ?14. Bradycardia: resolved, continue to monitor HR TID. Will maintain Toprol given HTN.  ?5/13 Still some mild bradycardia, asymptomatic HR ~57-58, CTM. ?5/14 CTM  HR TID. ? ? ?  01/28/2022  ?  1:14 PM 01/28/2022  ?  5:23 AM 01/27/2022  ?  7:42 PM  ?Vitals with BMI  ?Systolic Q000111Q Q000111Q Q000111Q  ?Diastolic 59 73 75  ?Pulse 58 58 69  ?  ?15. History of being overweight: BMI improved this hospitalization from 26.45 to 24.16. provide dietary education. Provided list of foods to assist in weight loss.  ?16. RLE foot drop: AFO ordered and applied.  ?5/13 Continue use of AFO per PT. ?17. Vitamin D deficiency: continue ergocalciferol 50,000U once per week for 7 weeks. Discussed with patient and he is agreeable ?5/13 Continue supplementing with ergocalciferol 50,000 U once a week for 7 weeks. ?18. Poor safety awareness: continue to provide education ?5/13 Continue to provide education. ?19. Fatigue: discussed that Vitamin D supplement can help this. B12 is also at low end of normal, 1,000mg  injected today (5/12) ?5/13 Could be due to low Vitamin D level as contributing factor. CTM labs. ?20. 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 Impulsivity: continue to reinforce importance of safety precautions to avoid falls ?5/13 Continue bed alarm and safety measures. ?5/14 Safety measures in place. ? ?LOS: ?6 days ?A FACE TO FACE EVALUATION WAS PERFORMED ? ?Luetta Nutting ?01/28/2022, 1:22 PM  ? ?  ?

## 2022-01-29 LAB — GLUCOSE, CAPILLARY
Glucose-Capillary: 118 mg/dL — ABNORMAL HIGH (ref 70–99)
Glucose-Capillary: 124 mg/dL — ABNORMAL HIGH (ref 70–99)
Glucose-Capillary: 159 mg/dL — ABNORMAL HIGH (ref 70–99)
Glucose-Capillary: 90 mg/dL (ref 70–99)

## 2022-01-29 MED ORDER — MAGNESIUM GLUCONATE 500 MG PO TABS
250.0000 mg | ORAL_TABLET | Freq: Every day | ORAL | Status: DC
  Administered 2022-01-29: 250 mg via ORAL
  Filled 2022-01-29: qty 1

## 2022-01-29 NOTE — Progress Notes (Signed)
Physical Therapy Session Note ? ?Patient Details  ?Name: Joseph Patrick ?MRN: [id number] ?Date of Birth: [birth year] ? ?Today's Date: 01/29/2022 ?PT Individual Time: 1100-1157 ?PT Individual Time Calculation (min): 57 min  ? ?Short Term Goals: ?Week 1:  PT Short Term Goal 1 (Week 1): Pt will complete bed<>chair transfers with CGA and LRAD ?PT Short Term Goal 2 (Week 1): Pt will ambulate 163f with CGA and LRAD ?PT Short Term Goal 3 (Week 1): Pt will complete functional outcome measure to assess falls risk ? ?Skilled Therapeutic Interventions/Progress Updates:  ?   ? ?Pt supine in bed to start session - agreeable to PT tx. Denies pain. Pt fully dressed with tennis shoes and R AFO on at start. [facility] to assist with determining bracing needs. Bed mobility completed with supervision with use of bed rails. Sitting balance indep with no support. Sit<>stand to RW with CGA - ambulates ~1532fto main rehab gym with supervision and RW - primary gait deficits extensor thrust with R foot slightly ER, occasional toe catching as well. ? ?Gait training using large based QC - ~35050fith CGA (intermittent minA for turns) on level surfaces - cues and demonstration for QC sequenicng - quick carryover and understanding. Dynamic gait training with QC - figure 8 with wide spaces cones and then narrow spaced cones - CGA/minA for figure-8.  ? ?Stair training up/down x12, 6 inch steps using 1 hand rail and CGA. Cues for stepping up with his L foot leading and down with R foot leading. Pt had some mild difficulty recalling this during stair training and would often attempt to step up with weaker R foot. ? ?Curb training on 8inch curb with QC and minA for steadying - x4 bouts without seated rest break. Cueing for safety and technique with QC - pt continuing trying to step up with weaker R foot resulting in increased falls risk. Otherwise required only minA for steadying while  competing curb transfers. ? ?Ambulated back to his room with QC, ~150f79fnd CGA. Concluded session seated EOB with direct handoff of care to NT who was present for BG check. Bed alarm on, all needs met. ? ? ?Therapy Documentation ?Precautions:  ?Precautions ?Precautions: Fall ?Precaution Comments: R hemiparesis and foot drop, decreased insight and questionable safety awareness ?Restrictions ?Weight Bearing Restrictions: No ?General: ?  ? ?Therapy/Group: Individual Therapy ? ?[name] P Nefertari Rebman ?01/29/2022, 7:28 AM  ?

## 2022-01-29 NOTE — Progress Notes (Signed)
?                                                       PROGRESS NOTE ? ? ?Subjective/Complaints: ?Continues to have fatigue. ?Did not note benefit or side effects from B12 injections ?Asks a lot of questions about diet ? ?ROS: +fatigue, +right sided weakness-improving, negative for dizziness or pain. ? ?Objective: ?  ?No results found. ?No results for input(s): WBC, HGB, HCT, PLT in the last 72 hours. ? ?No results for input(s): NA, K, CL, CO2, GLUCOSE, BUN, CREATININE, CALCIUM in the last 72 hours. ? ? ?Intake/Output Summary (Last 24 hours) at 01/29/2022 1239 ?Last data filed at 01/29/2022 0700 ?Gross per 24 hour  ?Intake 593 ml  ?Output 1550 ml  ?Net -957 ml  ?  ? ?  ? ?Physical Exam: ?Vital Signs ?Blood pressure (!) 160/74, pulse 60, temperature 97.6 ?F (36.4 ?C), resp. rate 17, height 5\' 9"  (1.753 m), weight 74.2 kg, SpO2 98 %. ?Physical Exam ?Gen: no distress, normal appearing, BMI 24.16, fatigued (generalized) ?HEENT: oral mucosa pink and moist, NCAT ?Cardio: Bradycardia ?Chest: normal effort, normal rate of breathing ?Abd: soft, non-distended ?Ext: no edema, TED garments in place ?Psych: pleasant, normal affect and behavior ? ?Skin: intact ?Neurological/MSK ?   Comments: Patient is alert and oriented x3. Able to converse well and can follow basic commands. ?Motor strength: 2/5 RUE with the exception of 3/5 EF and hand grip. 3/5 RLE except for 0/5 EHL and DF. Sensation is intact. Slow to move fingers of right hand. ?AFO in place RLE. Decreased insight into deficits ? ? ?Assessment/Plan: ?1. Functional deficits which require 3+ hours per day of interdisciplinary therapy in a comprehensive inpatient rehab setting. ?Physiatrist is providing close team supervision and 24 hour management of active medical problems listed below. ?Physiatrist and rehab team continue to assess barriers to discharge/monitor patient progress toward functional and medical goals ? ?Care Tool: ? ?Bathing ?   ?Body parts bathed by patient:  Right arm, Face, Chest, Abdomen, Front perineal area, Buttocks, Right upper leg, Left upper leg, Right lower leg, Left lower leg  ? Body parts bathed by helper: Left arm ?  ?  ?Bathing assist Assist Level: Minimal Assistance - Patient > 75% ?  ?  ?Upper Body Dressing/Undressing ?Upper body dressing   ?What is the patient wearing?: Pull over shirt ?   ?Upper body assist Assist Level: Minimal Assistance - Patient > 75% ?   ?Lower Body Dressing/Undressing ?Lower body dressing ? ? ?   ?What is the patient wearing?: Pants ? ?  ? ?Lower body assist Assist for lower body dressing: Minimal Assistance - Patient > 75% ?   ? ?Toileting ?Toileting Toileting Activity did not occur (Probation officer and hygiene only): N/A (no void or bm)  ?Toileting assist Assist for toileting: Independent with assistive device ?Assistive Device Comment: Urinal ?  ?Transfers ?Chair/bed transfer ? ?Transfers assist ?   ? ?Chair/bed transfer assist level: Contact Guard/Touching assist ?  ?  ?Locomotion ?Ambulation ? ? ?Ambulation assist ? ?   ? ?Assist level: Contact Guard/Touching assist ?Assistive device: Walker-rolling (RAFO) ?Max distance: 90  ? ?Walk 10 feet activity ? ? ?Assist ?   ? ?Assist level: Contact Guard/Touching assist ?Assistive device: Walker-rolling  ? ?Walk 50 feet activity ? ? ?Assist   ? ?  Assist level: Contact Guard/Touching assist ?Assistive device: Walker-rolling  ? ? ?Walk 150 feet activity ? ? ?Assist   ? ?Assist level: Minimal Assistance - Patient > 75% ?Assistive device: Walker-rolling ?  ? ?Walk 10 feet on uneven surface  ?activity ? ? ?Assist Walk 10 feet on uneven surfaces activity did not occur: Safety/medical concerns ? ? ?  ?   ? ?Wheelchair ? ? ? ? ?Assist Is the patient using a wheelchair?: Yes ?Type of Wheelchair: Manual ?Wheelchair activity did not occur: N/A ? ?Wheelchair assist level: Supervision/Verbal cueing ?Max wheelchair distance: 25  ? ? ?Wheelchair 50 feet with 2 turns activity ? ? ? ?Assist ? ?   ?Wheelchair 50 feet with 2 turns activity did not occur: N/A ? ? ?   ? ?Wheelchair 150 feet activity  ? ? ? ?Assist ? Wheelchair 150 feet activity did not occur: N/A ? ? ?   ? ?Blood pressure (!) 160/74, pulse 60, temperature 97.6 ?F (36.4 ?C), resp. rate 17, height 5\' 9"  (1.753 m), weight 74.2 kg, SpO2 98 %. ? ? ? Medical Problem List and Plan: ?1. 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            -patient may shower ?            -ELOS/Goals: 2.5 weeks , d/c date 5/26 ?            -Continue CIR ?2.  Impaired mobility, ambulating 150 feet d/c Lovenox ?            -antiplatelet therapy: Aspirin 81 mg daily and Plavix 75 mg daily with initial plan x3 weeks then aspirin alone ?3. Pain Management: Tylenol as needed ?4. Insomnia: add magnesium gluconate 250mg  HS ?5. Neuropsych: This patient is capable of making decisions on his own behalf. ?6. Skin/Wound Care: Routine skin checks ?7. Fluids/Electrolytes/Nutrition: Routine in and outs with follow-up chemistries ?8.  Chronic diastolic congestive heart failure.  Follows with Dr. Johnsie Cancel.  Continue Entresto 24-26 mg twice daily.  Monitor for any signs of fluid overload ?9.  Hypertension.Increase Bidil to tabs of 20-37.5 mg 3 times daily, Toprol 100 mg daily.  Monitor with increased mobility. Provided list of foods to help with blood pressure. Discontinued order for TEDs. Add magnesium gluconate 250mg  HS ?10.  Diabetes mellitus.  Hemoglobin A1c 7.1.  Increase Farxiga to 10 mg daily.  Diabetic teaching. Recommended avoiding added sugar. Provided education to avoid foods with added sugar. Reviewed CBGs [id number] and discussed with patient. Continue carb modified diet.  ? ?CBG (last 3)  ?Recent Labs  ?  01/28/22 ?2110 01/29/22 ?0610 01/29/22 ?1157  ?GLUCAP 141* 118* 124*  ? ?11.  Hyperlipidemia. continue Lipitor ?12.  CKD stage III.  Follow-up  chemistries. Cr 1.35 on most recent check.  ?13.  Medical noncompliance.  Counseling ?14. Bradycardia: resolved, continue to monitor HR TID. Will maintain Toprol given HTN.  ?5/13 Still some mild bradycardia, asymptomatic HR ~57-58, CTM. ?5/14 CTM HR TID. ? ? ?  01/29/2022  ?  8:40 AM 01/29/2022  ?  5:40 AM 01/28/2022  ?  7:45 PM  ?Vitals with BMI  ?Systolic 0000000 [id number]  ?Diastolic 74 75 69  ?Pulse 60 55 54  ?  ?15. History of being overweight: BMI improved this hospitalization from 26.45 to 24.16. provide dietary education. Provided list of foods to assist in weight loss.  ?16.  RLE foot drop: AFO ordered and applied.  ?5/13 Continue use of AFO per PT. ?17. Vitamin D deficiency: continue ergocalciferol 50,000U once per week for 7 weeks. Discussed with patient and he is agreeable ?5/13 Continue supplementing with ergocalciferol 50,000 U once a week for 7 weeks. ?18. Poor safety awareness: continue to provide education ?5/13 Continue to provide education. ?19. Fatigue: discussed that Vitamin D supplement can help this. B12 is also at low end of normal, 1,000mg  injected today (5/12) ?5/13 Could be due to low Vitamin D level as contributing factor. CTM labs. ?20. 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 Impulsivity: continue to reinforce importance of safety precautions to avoid falls ?5/13 Continue bed alarm and safety measures. ?5/14 Safety measures in place. ? ?LOS: ?7 days ?A FACE TO FACE EVALUATION WAS PERFORMED ? ?Martha Clan P Joeann Steppe ?01/29/2022, 12:39 PM  ? ?  ?

## 2022-01-29 NOTE — Progress Notes (Addendum)
Occupational Therapy Session Note ? ?Patient Details  ?Name: Joseph Patrick ?MRN: [id number] ?Date of Birth: 1946-03-28 ? ?Today's Date: 01/29/2022 ?OT Group Time:  1335 -1430 Group therapy ?  ? ? ?Short Term Goals: ?Week 1:  OT Short Term Goal 1 (Week 1): Pt will complete standing grooming task at sink with CGA. ?OT Short Term Goal 2 (Week 1): Pt will complete shower transfer with min A. ?OT Short Term Goal 3 (Week 1): Pt will therapeutically position RUE when seated in chair/bed with no more than min VCs. ?OT Short Term Goal 4 (Week 1): Pt will don shirt with S. ? ? ?Skilled Therapeutic Interventions/Progress Updates:  ?  Group therapy with focus on cardiopulmonary endurance, overall strengthening/ conditioning and coordination. Pt ambulated to and from group with quad cane and AFO donned with min A with cues for attention of placement of right LE ?  ?Performed: ?-w/c push ups with focus on bottom clearance 15 reps with A to maintain right Ue on arm rest for weight bearing ?-sit to stands 15 reps with contact guard with cues to weight shift forward ?-controlled stand to sit without UE support 15 reps with contact guard ?-standing balance (on one limb without UE support) with min A  ?-UE strengthening with and out tubing supported with coband with shoulder flexion with wrist extension against gravity, transition to functional grasp and release with mod A.  ?-Standing  and maintaining squat position  ?-Toe tapping on top of cone (bilaterally  - without UE support maintaining balance) with min A without UE support  ?-Right UE support pushing onto large therapy ball with focus on acieveing full extension extension with slight resistance of ball ?  ?Returned to the room and Safety measures in place.  ? ?Therapy Documentation ?Precautions:  ?Precautions ?Precautions: Fall ?Precaution Comments: R hemiparesis and foot drop, decreased insight and questionable safety awareness ?Restrictions ?Weight Bearing Restrictions:  No ? ?Pain: ? No c/o pain in group  ? ?Therapy/Group: Group Therapy ? ?Adan Sis ?01/29/2022, 4:10 PM ?

## 2022-01-29 NOTE — Progress Notes (Signed)
Occupational Therapy Session Note ? ?Patient Details  ?Name: Joseph Patrick ?MRN: [id number] ?Date of Birth: [birth year] ? ?Today's Date: 01/29/2022 ?OT Individual Time: 0845-1000 ?OT Individual Time Calculation (min): 75 min  ? ? ?Short Term Goals: ?Week 1:  OT Short Term Goal 1 (Week 1): Pt will complete standing grooming task at sink with CGA. ?OT Short Term Goal 2 (Week 1): Pt will complete shower transfer with min A. ?OT Short Term Goal 3 (Week 1): Pt will therapeutically position RUE when seated in chair/bed with no more than min VCs. ?OT Short Term Goal 4 (Week 1): Pt will don shirt with S. ? ?Skilled Therapeutic Interventions/Progress Updates:  ?  Subjective: Pt states his main priority is "getting this arm back to normal". No c/o pain throughout session.  ?  Objective:  Pt sitting EOB, sit to stand at RW and ambulated a few steps then stand pivot to w/c using RW with supervision.  Pt transported to ortho gym and completed stand pivot to nustep with close supervision.  Completed 10 minutes at resistance level 3 using ace wrap to secure right hand and instructed to propel primarily with RUE and RLE.  Pt then ambulated to EOM and completed neuro re-ed of the wrist using external target to tap with back of hand and facilitate wrist extension.  Pt having difficulty motor planning in gravity eliminated plane (able to complete but very inconsistent), therefore applied attended NMES to wrist extensors and cobanned digits into composite fist to isolate. NMES on the following settings: 35 MHz, 300 [place], ramp up/down 2sec, on/off 1:3 ratio.  Instructed pt to actively try to elicit movement even further when unit on.  Pt able to actively reach and place hand on RW splint in seated position throughout but needing regular cues to initiate instead of passively placing. Pt transported back to room and stand pivot to EOB with supervision using RW.     Call bell in reach, seat alarm on.   ?  Assessment:  Pt making progress  evidenced by improved functional use of RUE with cues.  Primary barriers today included impaired motor planning and weakness of right wrist extension. ?  Plan: Pt would benefit from further training on neuro re-ed of RUE with focus on wrist extension and functional grasp.  ? ?Therapy Documentation ?Precautions:  ?Precautions ?Precautions: Fall ?Precaution Comments: R hemiparesis and foot drop, decreased insight and questionable safety awareness ?Restrictions ?Weight Bearing Restrictions: No ? ? ? ?Therapy/Group: Individual Therapy ? ?Joseph Patrick ?01/29/2022, 10:07 AM ?

## 2022-01-30 LAB — GLUCOSE, CAPILLARY
Glucose-Capillary: 120 mg/dL — ABNORMAL HIGH (ref 70–99)
Glucose-Capillary: 131 mg/dL — ABNORMAL HIGH (ref 70–99)
Glucose-Capillary: 105 mg/dL — ABNORMAL HIGH (ref 70–99)
Glucose-Capillary: 145 mg/dL — ABNORMAL HIGH (ref 70–99)

## 2022-01-30 LAB — IRON AND TIBC
Iron: 64 ug/dL (ref 45–182)
Saturation Ratios: 22 % (ref 17.9–39.5)
TIBC: 287 ug/dL (ref 250–450)
UIBC: 223 ug/dL

## 2022-01-30 MED ORDER — MAGNESIUM GLUCONATE 500 MG PO TABS
500.0000 mg | ORAL_TABLET | Freq: Every day | ORAL | Status: DC
  Administered 2022-01-30 – 2022-02-07 (×9): 500 mg via ORAL
  Filled 2022-01-30 (×9): qty 1

## 2022-01-30 NOTE — Progress Notes (Signed)
Occupational Therapy Session Note ? ?Patient Details  ?Name: Joseph Patrick ?MRN: [id number] ?Date of Birth: Dec 07, 1945 ? ?Today's Date: 01/30/2022 ?OT Individual Time: 1400-1500 ?OT Individual Time Calculation (min): 60 min  ? ? ?Short Term Goals: ?Week 1:  OT Short Term Goal 1 (Week 1): Pt will complete standing grooming task at sink with CGA. ?OT Short Term Goal 1 - Progress (Week 1): Met ?OT Short Term Goal 2 (Week 1): Pt will complete shower transfer with min A. ?OT Short Term Goal 2 - Progress (Week 1): Met ?OT Short Term Goal 3 (Week 1): Pt will therapeutically position RUE when seated in chair/bed with no more than min VCs. ?OT Short Term Goal 3 - Progress (Week 1): Met ?OT Short Term Goal 4 (Week 1): Pt will don shirt with S. ?OT Short Term Goal 4 - Progress (Week 1): Met ?Week 2:  OT Short Term Goal 1 (Week 2): STGs=LTGs ?Week 3:    ? ?Skilled Therapeutic Interventions/Progress Updates:  ?  1:1 Pt received in the room ; pt ambulated to the gym with quad cane with AFO on the left.  ? ?NMR: with focus on gross and fMC with normal patterns of movement to improve functional mobility in ADLs ?-performed floor transfer with min A with instructional cues ?-work in tall kneeling WITH goal of keeping right hand on the mat; core work to "walk forward and backwards on his knees with facilitation for weight shift and core and right Le control ?-transitioned into long sitting with hamstring stretching with bilateral UEs with A for right UE ?- hip, back and LE stretch with "hollywood stretch" and bent knee pyriformis stretch bilaterally with A to help getting into position with right UE  and LE.  ?-in supine focus on performing PNF / D1 D2 with focus on achieving end range and corporation of hand. ?-transitioned into anatomical  position with receiving foam block in right hand and bring it across his body with scapular protraction and releasing it in box on his left side- sustaining grasp ?-transfer back to mat with min A  with instructional cues  ? ?Ambulated back to his room with contact guard- left in recliner with safety measures in place.  ? ?Therapy Documentation ?Precautions:  ?Precautions ?Precautions: Fall ?Precaution Comments: R hemiparesis and foot drop, decreased insight and questionable safety awareness ?Restrictions ?Weight Bearing Restrictions: No ? ?Pain: ? No reports of pain  ? ? ?Therapy/Group: Individual Therapy ? ?Nicoletta Ba ?01/30/2022, 3:55 PM ?

## 2022-01-30 NOTE — Progress Notes (Signed)
Occupational Therapy Weekly Progress Note ? ?Patient Details  ?Name: Joseph Patrick ?MRN: [id number] ?Date of Birth: 01-08-1946 ? ?Beginning of progress report period: Jan 23, 2022 ?End of progress report period: Jan 30, 2022 ? ?Today's Date: 01/30/2022 ?OT Individual Time: 0845-1000 ?OT Individual Time Calculation (min): 75 min  ? ? ?Patient has met 4 of 4 short term goals.  Pt is progressing steadily as expected and showing gains functionally including increased independence with functional transfers now needing only close supervision at RW level.  Pt's focus has been to regain functional use of RUE and treatment sessions have reflected this.  He has improved strength and motor control of RUE and can now reach and place right hand on walker actively with supervision only which has improved from completing completely passively.  Pt is still very limited with wrist extension which hinders his functional grasp of items including toothbrush and feeding utensils as well as very limited fine motor control for tasks such as tying shoes and pulling up pants.   ? ?Patient continues to demonstrate the following deficits: muscle weakness, decreased cardiorespiratoy endurance, motor apraxia, decreased coordination, and decreased motor planning, right side neglect, decreased attention, decreased awareness, decreased problem solving, decreased safety awareness, decreased memory, and delayed processing, and decreased sitting balance, decreased standing balance, decreased postural control, hemiplegia, and decreased balance strategies and therefore will continue to benefit from skilled OT intervention to enhance overall performance with BADL and iADL. ? ?Patient progressing toward long term goals..  Continue plan of care. ? ?OT Short Term Goals ?Week 1:  OT Short Term Goal 1 (Week 1): Pt will complete standing grooming task at sink with CGA. ?OT Short Term Goal 1 - Progress (Week 1): Met ?OT Short Term Goal 2 (Week 1): Pt will complete  shower transfer with min A. ?OT Short Term Goal 2 - Progress (Week 1): Met ?OT Short Term Goal 3 (Week 1): Pt will therapeutically position RUE when seated in chair/bed with no more than min VCs. ?OT Short Term Goal 3 - Progress (Week 1): Met ?OT Short Term Goal 4 (Week 1): Pt will don shirt with S. ?OT Short Term Goal 4 - Progress (Week 1): Met ?Week 2: STGs=LTGs ? ? ?Skilled Therapeutic Interventions/Progress Updates:  ?  Subjective: Pt states he already washed up and got dressed with nursing because he likes to get this done very early in the morning.  He is concerned about his right side feeling weaker in the morning.  Re-educated pt on taking it slower in the morning as part of the healing process. ?  Objective:  Pt sitting EOB.  Sit to stand at [facility] and ambulated 30 feet to bathroom and completed tub bench transfer first time with min assist to elevate RLE in, then second attempt faded to supervision.  Pt then ambulated to large gym and completed quadreped neuro re-ed and manual support at right elbow to prevent from buckling when shifting weight onto arm.  Pt also needing cues to prevent compensatory movements to encourage increased periscapular recruitment and disengage smaller neck musculature.  Pt completed serratus anterior pushes 2x 15 reps.  Returned to supine with CGA and completed AROM shoulder flexion 3x15 reps with cues to slow pace and increase motor control and precision.  Pt ambulated back to room using RW with close supervision and stand to sit at EOB.  Call bell in reach, seat alarm on.   ?  Assessment:  See above ?  Plan: Pt would benefit from further  training on shower level bathing.  ? ?Therapy Documentation ?Precautions:  ?Precautions ?Precautions: Fall ?Precaution Comments: R hemiparesis and foot drop, decreased insight and questionable safety awareness ?Restrictions ?Weight Bearing Restrictions: No ? ? ?Therapy/Group: Individual Therapy ? ?Caryl Asp Jeslin Bazinet ?01/30/2022, 7:33 AM  ?

## 2022-01-30 NOTE — Progress Notes (Signed)
?                                                       PROGRESS NOTE ? ? ?Subjective/Complaints: ?Continues to have fatigue when he wakes in the morning and it is unsettling for him to wake with his right side heavy and numb. He is an early riser.  ? ?ROS: +fatigue, +right sided weakness-improving, negative for dizziness or pain. +heaviness in right side in morning ? ?Objective: ?  ?No results found. ?No results for input(s): WBC, HGB, HCT, PLT in the last 72 hours. ? ?No results for input(s): NA, K, CL, CO2, GLUCOSE, BUN, CREATININE, CALCIUM in the last 72 hours. ? ? ?Intake/Output Summary (Last 24 hours) at 01/30/2022 1040 ?Last data filed at 01/30/2022 0840 ?Gross per 24 hour  ?Intake 836 ml  ?Output 1500 ml  ?Net -664 ml  ?  ? ?  ? ?Physical Exam: ?Vital Signs ?Blood pressure (!) 148/76, pulse 66, temperature 98 ?F (36.7 ?C), resp. rate 18, height 5\' 9"  (1.753 m), weight 74.2 kg, SpO2 99 %. ?Physical Exam ?Gen: no distress, normal appearing, BMI 24.16, fatigued (generalized) ?HEENT: oral mucosa pink and moist, NCAT ?Cardio: Bradycardia ?Chest: normal effort, normal rate of breathing ?Abd: soft, non-distended ?Ext: no edema, TED garments in place ?Psych: pleasant, normal affect and behavior ? ?Skin: intact ?Neurological/MSK ?   Comments: Patient is alert and oriented x3. Able to converse well and can follow basic commands. ?Motor strength: 2/5 RUE with the exception of 3/5 EF and hand grip. 3/5 RLE except for 0/5 EHL and DF. Sensation is intact. Slow to move fingers of right hand. ?AFO in place RLE. Decreased insight into deficits ?Ambulating CG ? ? ?Assessment/Plan: ?1. Functional deficits which require 3+ hours per day of interdisciplinary therapy in a comprehensive inpatient rehab setting. ?Physiatrist is providing close team supervision and 24 hour management of active medical problems listed below. ?Physiatrist and rehab team continue to assess barriers to discharge/monitor patient progress toward functional  and medical goals ? ?Care Tool: ? ?Bathing ?   ?Body parts bathed by patient: Right arm, Face, Chest, Abdomen, Front perineal area, Buttocks, Right upper leg, Left upper leg, Right lower leg, Left lower leg  ? Body parts bathed by helper: Left arm ?  ?  ?Bathing assist Assist Level: Minimal Assistance - Patient > 75% ?  ?  ?Upper Body Dressing/Undressing ?Upper body dressing   ?What is the patient wearing?: Pull over shirt ?   ?Upper body assist Assist Level: Minimal Assistance - Patient > 75% ?   ?Lower Body Dressing/Undressing ?Lower body dressing ? ? ?   ?What is the patient wearing?: Pants ? ?  ? ?Lower body assist Assist for lower body dressing: Minimal Assistance - Patient > 75% ?   ? ?Toileting ?Toileting Toileting Activity did not occur (Probation officer and hygiene only): N/A (no void or bm)  ?Toileting assist Assist for toileting: Independent with assistive device ?Assistive Device Comment: Urinal ?  ?Transfers ?Chair/bed transfer ? ?Transfers assist ?   ? ?Chair/bed transfer assist level: Contact Guard/Touching assist ?  ?  ?Locomotion ?Ambulation ? ? ?Ambulation assist ? ?   ? ?Assist level: Contact Guard/Touching assist ?Assistive device: Walker-rolling (RAFO) ?Max distance: 90  ? ?Walk 10 feet activity ? ? ?Assist ?   ? ?Assist  level: Contact Guard/Touching assist ?Assistive device: Walker-rolling  ? ?Walk 50 feet activity ? ? ?Assist   ? ?Assist level: Contact Guard/Touching assist ?Assistive device: Walker-rolling  ? ? ?Walk 150 feet activity ? ? ?Assist   ? ?Assist level: Minimal Assistance - Patient > 75% ?Assistive device: Walker-rolling ?  ? ?Walk 10 feet on uneven surface  ?activity ? ? ?Assist Walk 10 feet on uneven surfaces activity did not occur: Safety/medical concerns ? ? ?  ?   ? ?Wheelchair ? ? ? ? ?Assist Is the patient using a wheelchair?: Yes ?Type of Wheelchair: Manual ?Wheelchair activity did not occur: N/A ? ?Wheelchair assist level: Supervision/Verbal cueing ?Max wheelchair  distance: 25  ? ? ?Wheelchair 50 feet with 2 turns activity ? ? ? ?Assist ? ?  ?Wheelchair 50 feet with 2 turns activity did not occur: N/A ? ? ?   ? ?Wheelchair 150 feet activity  ? ? ? ?Assist ? Wheelchair 150 feet activity did not occur: N/A ? ? ?   ? ?Blood pressure (!) 148/76, pulse 66, temperature 98 ?F (36.7 ?C), resp. rate 18, height 5\' 9"  (1.753 m), weight 74.2 kg, SpO2 99 %. ? ? ? Medical Problem List and Plan: ?1. 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            -patient may shower ?            -ELOS/Goals: 2.5 weeks , d/c date 5/26 ?            -Continue CIR ?2.  Impaired mobility, ambulating 150 feet d/c Lovenox ?            -antiplatelet therapy: Aspirin 81 mg daily and Plavix 75 mg daily with initial plan x3 weeks then aspirin alone ?3. Pain Management: Tylenol as needed ?4. Insomnia: resolved, increase magnesium gluconate to 500mg  HS ?5. Neuropsych: This patient is capable of making decisions on his own behalf. ?6. Skin/Wound Care: Routine skin checks ?7. Fluids/Electrolytes/Nutrition: Routine in and outs with follow-up chemistries ?8.  Chronic diastolic congestive heart failure.  Follows with Dr. Johnsie Cancel.  Continue Entresto 24-26 mg twice daily.  Monitor for any signs of fluid overload ?9.  Hypertension.Increase Bidil to tabs of 20-37.5 mg 3 times daily, Toprol 100 mg daily.  Monitor with increased mobility. Provided list of foods to help with blood pressure. Discontinued order for TEDs. Increase magnesium gluconate to 500mg  HS ?10.  Diabetes mellitus.  Hemoglobin A1c 7.1.  Increase Farxiga to 10 mg daily.  Diabetic teaching. Recommended avoiding added sugar. Provided education to avoid foods with added sugar. Reviewed CBGs [id number] and discussed with patient. Continue carb modified diet.  ? ?CBG (last 3)  ?Recent Labs  ?  01/29/22 ?1646 01/29/22 ?2030 01/30/22 ?0640   ?GLUCAP 159* 90 120*  ? ?11.  Hyperlipidemia. continue Lipitor ?12.  CKD stage III.  Follow-up chemistries. Cr 1.35 on most recent check.  ?13.  Medical noncompliance.  Counseling ?14. Bradycardia: resolved, continue to monitor HR TID. Will maintain Toprol given HTN.  ?5/13 Still some mild bradycardia, asymptomatic HR ~57-58, CTM. ?5/14 CTM HR TID. ? ? ?  01/30/2022  ?  9:28 AM 01/30/2022  ?  5:19 AM 01/29/2022  ?  7:55 PM  ?Vitals with BMI  ?Systolic [id number]  ?Diastolic 76 74 71  ?Pulse 66 62 73  ?  ?15. History of being overweight:  BMI improved this hospitalization from 26.45 to 24.16. provide dietary education. Provided list of foods to assist in weight loss.  ?16. RLE foot drop: AFO ordered and applied.  ?5/13 Continue use of AFO per PT. ?17. Vitamin D deficiency: continue ergocalciferol 50,000U once per week for 7 weeks. Discussed with patient and he is agreeable ?5/13 Continue supplementing with ergocalciferol 50,000 U once a week for 7 weeks. ?18. Poor safety awareness: continue to provide education ?5/13 Continue to provide education. ?19. Fatigue: discussed that Vitamin D supplement can help this. B12 is also at low end of normal, 1,000mg  injected today (5/12) ?5/13 Could be due to low Vitamin D level as contributing factor. CTM labs. ?20. 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 Impulsivity: continue to reinforce importance of safety precautions to avoid falls ?5/13 Continue bed alarm and safety measures. ?5/14 Safety measures in place. ? ?LOS: ?8 days ?A FACE TO FACE EVALUATION WAS PERFORMED ? ?Martha Clan P Odilon Cass ?01/30/2022, 10:40 AM  ? ?  ?

## 2022-01-30 NOTE — Progress Notes (Signed)
Physical Therapy Session Note ? ?Patient Details  ?Name: Joseph Patrick ?MRN: [id number] ?Date of Birth: [birth year] ? ?Today's Date: 01/30/2022 ?PT Individual Time: 1305-1400 ?PT Individual Time Calculation (min): 55 min  ? ?Short Term Goals: ?Week 1:  PT Short Term Goal 1 (Week 1): Pt will complete bed<>chair transfers with CGA and LRAD ?PT Short Term Goal 2 (Week 1): Pt will ambulate 124f with CGA and LRAD ?PT Short Term Goal 3 (Week 1): Pt will complete functional outcome measure to assess falls risk ? ?Skilled Therapeutic Interventions/Progress Updates: Pt presented in recliner agreeable to therapy. Pt denies pain during session. Session to focus on gait and balance incorporating AFO consult. Pt [facility]. Pt required CGA with intermittent slight circumduction noted and intermittent genu recurvatum on RLE. Upon arrival in gym pt indicating need for BM, pt stating did not have need prior and upon discussion advised best to return to room. Pt ambulated back to room in same manner as prior with pt being supervision for clothing management and toilet transfers (+BM). Pt was able to complete all toileting tasks with supervision. Pt amb[facility] participated in balance activity performing toe taps to cones. Pt was unable to perform without use of AD as noted [facility] from [facility]akelinearrived and pt performed various ambulation tasks for set up with correct AFO. Once completed pt then performed static stand and weight shifting on Airex. Pt noted to have increased difficulty maintaining TKE on RLE when weight shifting to R but was able to perform with mod multomodal cues. Pt ambulated back to room CGA at end of session and returned to recliner. Pt left in recliner at end of session with belt alarm placed (not on as OT to dovetail session), call bell within reach and needs met.  ?   ? ?Therapy Documentation ?Precautions:  ?Precautions ?Precautions:  Fall ?Precaution Comments: R hemiparesis and foot drop, decreased insight and questionable safety awareness ?Restrictions ?Weight Bearing Restrictions: No ?General: ?  ?Vital Signs: ?Therapy Vitals ?Temp: 98.2 ?F (36.8 ?C) ?Temp Source: Oral ?Pulse Rate: 60 ?Resp: 19 ?BP: (!) 118/58 ?Patient Position (if appropriate): Sitting ?Oxygen Therapy ?SpO2: 98 % ?O2 Device: Room Air ?Pain: ?  ?Mobility: ?  ?Locomotion : ?   ?Trunk/Postural Assessment : ?   ?Balance: ?  ?Exercises: ?  ?Other Treatments:   ? ? ? ?Therapy/Group: Individual Therapy ? ?Joseph Patrick ?01/30/2022, 3:59 PM  ?

## 2022-01-31 LAB — GLUCOSE, CAPILLARY
Glucose-Capillary: 111 mg/dL — ABNORMAL HIGH (ref 70–99)
Glucose-Capillary: 123 mg/dL — ABNORMAL HIGH (ref 70–99)
Glucose-Capillary: 141 mg/dL — ABNORMAL HIGH (ref 70–99)
Glucose-Capillary: 127 mg/dL — ABNORMAL HIGH (ref 70–99)

## 2022-01-31 NOTE — Progress Notes (Signed)
Physical Therapy Session Note  Patient Details  Name: Joseph Patrick MRN: [id number] Date of Birth: [birth year]  Today's Date: 01/31/2022 PT Individual Time: 1140-1211 PT Individual Time Calculation (min): 31 min   Short Term Goals: Week 1:  PT Short Term Goal 1 (Week 1): Pt will complete bed<>chair transfers with CGA and LRAD PT Short Term Goal 1 - Progress (Week 1): Met PT Short Term Goal 2 (Week 1): Pt will ambulate 11f with CGA and LRAD PT Short Term Goal 2 - Progress (Week 1): Met PT Short Term Goal 3 (Week 1): Pt will complete functional outcome measure to assess falls risk PT Short Term Goal 3 - Progress (Week 1): Met Week 2:  PT Short Term Goal 1 (Week 2): STG = LTG due to ELOS  Skilled Therapeutic Interventions/Progress Updates:  Patient seated upright in recliner on entrance to room. Patient alert and agreeable to PT session. Relates fatigue following morning PT and OT sessions but demonstrates increased wrist flexion that he is very happy about.  Patient with no pain complaint throughout session.  Therapeutic Activity: Transfers: Patient performed sit<>stand and stand pivot transfers throughout session with supervision. Provided verbal cues for decreasing push of BLE into seated surface.   Gait Training:  Patient ambulated ~270 ft x2 using LBQC with close supervision/ CGA. Demonstrated intermittent catch of R toe. Provided vc/ tc for increasing hip/ knee flexion during swing phase for improved clearance. Good focus on maintaining neutral hip rotation for forward toe placement to start stance phase.   Neuromuscular Re-ed: NMR facilitated during session with focus on standing balance. Pt guided in sit<>stand training with focus on midline positioning and increased use of RLE. Initially increased use of BLE extension into mat table to assist with power up and controlling descent, but is able to improve in quality and positioning after blocked practice.  NMR performed for  improvements in motor control and coordination, balance, sequencing, judgement, and self confidence/ efficacy in performing all aspects of mobility at highest level of independence.   Patient seated  in recliner at end of session with brakes locked, belt alarm set, and all needs within reach.   Therapy Documentation Precautions:  Precautions Precautions: Fall Precaution Comments: R hemiparesis and foot drop, decreased insight and questionable safety awareness Restrictions Weight Bearing Restrictions: No General:   Vital Signs: Therapy Vitals Pulse Rate: 65 BP: 135/65 Pain: Pain Assessment Pain Scale: 0-10 Pain Score: 0-No pain  Therapy/Group: Individual Therapy  JAlger SimonsPT, DPT 01/31/2022, 12:40 PM

## 2022-01-31 NOTE — Progress Notes (Signed)
Occupational Therapy Session Note ? ?Patient Details  ?Name: Joseph Patrick ?MRN: [id number] ?Date of Birth: Dec 02, 1945 ? ?Today's Date: 01/31/2022 ?OT Individual Time: 1000-1100 ?OT Individual Time Calculation (min): 60 min  ? ? ?Short Term Goals: ?Week 2:  OT Short Term Goal 1 (Week 2): STGs=LTGs ? ?Skilled Therapeutic Interventions/Progress Updates:  ?Pt states feeling tired from previous PT session, requesting to work primarily on right hand.  ?   Pt sitting EOB. Sit to stand using quad cane and ambulated approximately 50 feet to large gym with close supervision  and one episode of LOB with pt able to self recover without assist and completed neuro re-ed RUE including attended FES to right wrist extension and thumb palmar abduction with functional grasp of cup and then actively bring to mouth and placing back on table.  Educated pt regarding tenodesis biomechanics to promote stronger grip.  Isolated MCP jt extension with coban taping digits into extrinsic plus and isolating EDC activation.  FES settings 35 MHz 300 [place] 2sec ramp up/down, 1:3 on/off ratio.  Skin intact prior and after application. No pain reported during FES.  Ambulated back to room with close supervision.   Call bell in reach, seat alarm on.   ? ? ?Therapy Documentation ?Precautions:  ?Precautions ?Precautions: Fall ?Precaution Comments: R hemiparesis and foot drop, decreased insight and questionable safety awareness ?Restrictions ?Weight Bearing Restrictions: No ? ? ?Therapy/Group: Individual Therapy ? ?Caryl Asp Calla Wedekind ?01/31/2022, 12:03 PM ?

## 2022-01-31 NOTE — Progress Notes (Signed)
Patient ID: Joseph Patrick, male   DOB: 13-Jan-1946, 76 y.o.   MRN: [id number]  Met with pt to update regarding team conference progress toward he is goals of supervision and discharge still 5/26. He feels he is making progress and this is encouraging to him. Discussed home health and he is agreeable to this and will allow them to come and do therapies with him. He does need a LBQC and tub bench. Will continue to work on discharge needs. ?

## 2022-01-31 NOTE — Progress Notes (Signed)
Occupational Therapy Session Note ? ?Patient Details  ?Name: Joseph Patrick ?MRN: [id number] ?Date of Birth: [birth year] ? ?Today's Date: 01/31/2022 ?OT Individual Time: 2780-0447 ?OT Individual Time Calculation (min): 34 min  ? ? ?Short Term Goals: ?Week 1:  OT Short Term Goal 1 (Week 1): Pt will complete standing grooming task at sink with CGA. ?OT Short Term Goal 1 - Progress (Week 1): Met ?OT Short Term Goal 2 (Week 1): Pt will complete shower transfer with min A. ?OT Short Term Goal 2 - Progress (Week 1): Met ?OT Short Term Goal 3 (Week 1): Pt will therapeutically position RUE when seated in chair/bed with no more than min VCs. ?OT Short Term Goal 3 - Progress (Week 1): Met ?OT Short Term Goal 4 (Week 1): Pt will don shirt with S. ?OT Short Term Goal 4 - Progress (Week 1): Met ?Week 2:  OT Short Term Goal 1 (Week 2): STGs=LTGs ? ?Skilled Therapeutic Interventions/Progress Updates:  ?  Pt received seated in recliner, no c/o pain and, agreeable to therapy. Session focus on RUE NMR in prep for improved ADL/IADL/func mobility performance + decreased caregiver burden. ? ?Ambulated throughout session close S with QC, reports having just received adjusted R AFO. Did perseverate on RLE "drift" with new AFO, but later reports self-perceived improvement after practice by end of session, cues to point R toes forward throughout.  ? ?Pt completed the following games on BITS to target RUE NMR: ? ?Game:Single Target, utilized R ace wrap/drum stick as stylus: ?Accuracy: 69.23% Reaction Time: 6.54 secs Duration Time: 2 min Hits: 18 hits ?Accuracy: 67.5% Reaction Time: 4.38 secs Duration Time: 2 min Hits: 27 hits ? ?Finally, completed 3x10 kayak/figure 8s + volley ball toss with use of 2 lb dowel rod and use of ace wrap to R grip. ? ?Pt left seated in recliner with safety belt alarm engaged, call bell in reach, and all immediate needs met.  ? ? ?Therapy Documentation ?Precautions:  ?Precautions ?Precautions: Fall ?Precaution  Comments: R hemiparesis and foot drop, decreased insight and questionable safety awareness ?Restrictions ?Weight Bearing Restrictions: No ? ?Pain: no c/o throughout ?  ?ADL: See Care Tool for more details. ? ?Therapy/Group: Individual Therapy ? ?Volanda Napoleon MS, OTR/L ? ?01/31/2022, 6:50 AM ?

## 2022-01-31 NOTE — Progress Notes (Signed)
Physical Therapy Weekly Progress Note ? ?Patient Details  ?Name: Joseph Patrick ?MRN: [id number] ?Date of Birth: [birth year] ? ?Beginning of progress report period: Jan 23, 2022 ?End of progress report period: Jan 31, 2022 ? ?Today's Date: 01/31/2022 ?PT Individual Time: 1017-5102 ?PT Individual Time Calculation (min): 75 min  ? ?Patient has met 3 of 3 short term goals.  Pt is making appropriate progress towards goals. He continues to be highly motivated to regain functional independence and is working hard in daily therapies. Overall, he is completing bed mobility mod I, sit<>stand transfers with supervision, ambulating >148f with CGA and LBQC, and minA for navigating 12 steps using 1 hand rail. [facility] Continues to be primarily limited by R sided weakness, decreased insight into deficits, decreased safety awareness, and decreased dynamic standing balance. ? ?Patient continues to demonstrate the following deficits muscle weakness, decreased cardiorespiratoy endurance, unbalanced muscle activation and decreased motor planning, and decreased standing balance, hemiplegia, and decreased balance strategies and therefore will continue to benefit from skilled PT intervention to increase functional independence with mobility. ? ?Patient progressing toward long term goals..  Continue plan of care. ? ?PT Short Term Goals ?Week 2:  PT Short Term Goal 1 (Week 2): STG = LTG due to ELOS ? ?Skilled Therapeutic Interventions/Progress Updates:  ?   ? ? ?Pt supine in bed to start - agreeable to PT tx. Denies pain. Bed mobility completed mod I. Donned tennis shoes and R AFO with totalA for time management.  ? ?[facility]<>[facility] - using back of legs against bed to assist with pushing up to stand. Gait training [facility]1536f[facility] to main rehab gym - improved knee stability and less toe catching compared to prior days. [facility]  ? ?Worked on NMR in quadruped position on mat table - able to achieve position with light minA for RUE positioning only. In quadruped position, required support at his R shoulder to prevent buckling - completed hand taps to cone - 5x10 + 1x20 sets with rest breaks b/w due to knee discomfort - provided pillow under L knee for added cushion. 5x10 + 1x20 modified push-ups in quadruped position with PT supporting R shoulder.  ? ?Continued NMR with RLE strengthening on 6inch step using 1 hand rail on L for support - repeated step ups forwards direction x2 min x2 sets with R foot leading to target strengthening & repeated step ups lateral direction [id number] sets with R foot leading to target strengthening - CGA provided for safety.  ? ?Ambulated back to his room with CGA and LBQC, [id number] fatigue on R side due to NMR from session- concluded session seated in recliner, safety belt alarm on, all needs met.  ? ? ?Therapy Documentation ?Precautions:  ?Precautions ?Precautions: Fall ?Precaution Comments: R hemiparesis and foot drop, decreased insight and questionable safety awareness ?Restrictions ?Weight Bearing Restrictions: No ?General: ?  ? ? ?Therapy/Group: Individual Therapy ? ?Lavalle Skoda P Cuong Moorman ?01/31/2022, 7:31 AM  ?

## 2022-01-31 NOTE — Plan of Care (Signed)
?  Problem: Consults ?Goal: RH STROKE PATIENT EDUCATION ?Description: See Patient Education module for education specifics  ?Outcome: Progressing ?  ?Problem: RH SAFETY ?Goal: RH STG ADHERE TO SAFETY PRECAUTIONS W/ASSISTANCE/DEVICE ?Description: STG Adhere to Safety Precautions With cues Assistance/Device. ?Outcome: Progressing ?  ?Problem: RH KNOWLEDGE DEFICIT ?Goal: RH STG INCREASE KNOWLEDGE OF DIABETES ?Description: Patient and niece will be able to manage DM with medications and dietary modifications using handouts and educational resources independently ?Outcome: Progressing ?Goal: RH STG INCREASE KNOWLEDGE OF HYPERTENSION ?Description: Patient and niece will be able to manage HTN with medications and dietary modifications using handouts and educational resources independently ?Outcome: Progressing ?Goal: RH STG INCREASE KNOWLEGDE OF HYPERLIPIDEMIA ?Description: Patient and niece will be able to manage HLD with medications and dietary modifications using handouts and educational resources independently ?Outcome: Progressing ?Goal: RH STG INCREASE KNOWLEDGE OF STROKE PROPHYLAXIS ?Description: Patient and niece will be able to manage secondary stroke risks ? with medications and dietary modifications using handouts and educational resources independently ?Outcome: Progressing ?  ?Problem: Education: ?Goal: Ability to demonstrate management of disease process will improve ?Outcome: Progressing ?Goal: Ability to verbalize understanding of medication therapies will improve ?Outcome: Progressing ?Goal: Individualized Educational Video(s) ?Outcome: Progressing ?  ?

## 2022-01-31 NOTE — Patient Care Conference (Signed)
Inpatient RehabilitationTeam Conference and Plan of Care Update ?Date: 01/31/2022   Time: 11:06 AM  ? ? ?Patient Name: Joseph Patrick      ?Medical Record Number: [id number]  ?Date of Birth: July 13, 1946 ?Sex: Male         ?Room/Bed: 4M04C/4M04C-01 ?Payor Info: Payor: [employer] MEDICARE / Plan: HU[employer] MEDICARE HMO / Product Type: *No Product type* /   ? ?Admit Date/Time:  01/22/2022  2:04 PM ? ?Primary Diagnosis:  Small vessel disease, cerebrovascular ? ?Hospital Problems: Principal Problem: ?  Small vessel disease, cerebrovascular ? ? ? ?Expected Discharge Date: Expected Discharge Date: 02/09/22 ? ?Team Members Present: ?Physician leading conference: Dr. Sula Soda ?Social Worker Present: Dossie Der, LCSW ?Nurse Present: Chana Bode, RN ?PT Present: Wynelle Link, PT ?OT Present: Dolphus Jenny, OT ?SLP Present: Sarita Bottom, SLP ?PPS Coordinator present : Fae Pippin, SLP ? ?   Current Status/Progress Goal Weekly Team Focus  ?Bowel/Bladder ? ?   continent        ?Swallow/Nutrition/ Hydration ? ?           ?ADL's ? ? close supervision functional transfers using RW or LBQC, setup UB self care; min assist LB dressing; close supervision bathing using showe bench  S to mod I  RUE NMR, self care/balance/transfer retraining, pt education   ?Mobility ? ? mod I bed mobility, CGA sit<>stand and stand<>pivot transfers, CGA gait using LBQC or RW, minA 4 stairs using 1 hand rail (pt has elevator access). AFO consult completed yesterday for R foot drop and PF weakness. Continues to have decreased insight and safety awareness  Supervision  RLE NMR, gait training, safety awareness, DC planning   ?Communication ? ?           ?Safety/Cognition/ Behavioral Observations ?           ?Pain ? ?   N/A        ?Skin ? ?   N/A        ? ? ?Discharge Planning:  ?Making progress in therapies and plans on going home to apartment alone with niece checking on and his sister checking on him. He is very independent   ?Team Discussion: ?Patient  with poor insight and awareness post CVA.  ?Patient on target to meet rehab goals: ?yes, currently needs min assist for lower body care and don/doff AFO. Needs close supervison for bathing at the shower level. Mod I for bed mobility and CGA for sit - stand. Managed 12 steps with min assist but needs cues for sequencing using a quad cane. Goals for discharge set for supervision - mod I assist. ? ?*See Care Plan and progress notes for long and short-term goals.  ? ?Revisions to Treatment Plan:  ?AFO consult ?  ?Teaching Needs: ?Safety, transfers, medications, dietary modifications, etc  ?Current Barriers to Discharge: ?Decreased caregiver support ? ?Possible Resolutions to Barriers: ?HH follow up services ?DME: [facility] ?  ? ? Medical Summary ?Current Status: hypertension, type 2 diabetes mellitus, right sided upper and lowe extremity weakness, fatigue ? Barriers to Discharge: Medical stability ? Barriers to Discharge Comments: hypertension, type 2 diabetes mellitus, right sided upper and lowe extremity weakness, fatigue ?Possible Resolutions to [employer]: increased Bidil, increased Farxiga, provided dietary education, ordered new AFO, started monthly B12 injection, iron level checked and is normal ? ? ?Continued Need for Acute Rehabilitation Level of Care: The patient requires daily medical management by a physician with specialized training in physical medicine and rehabilitation for the following reasons: ?Direction  of a multidisciplinary physical rehabilitation program to maximize functional independence : Yes ?Medical management of patient stability for increased activity during participation in an intensive rehabilitation regime.: Yes ?Analysis of laboratory values and/or radiology reports with any subsequent need for medication adjustment and/or medical intervention. : Yes ? ? ?I attest that I was present, lead the team conference, and concur with the assessment and plan of the team. ? ? ?Chana Bode  B ?01/31/2022, 4:52 PM  ? ? ? ? ? ? ?

## 2022-01-31 NOTE — Progress Notes (Signed)
?                                                       PROGRESS NOTE ? ? ?Subjective/Complaints: ?Fatigue improved today ?Working with [place] on stairs in gym ?New AFO eval today ?Blood pressure now much better controlled ? ?ROS: +fatigue-improved, +right sided weakness-improving, negative for dizziness or pain. +heaviness in right side in morning ? ?Objective: ?  ?No results found. ?No results for input(s): WBC, HGB, HCT, PLT in the last 72 hours. ? ?No results for input(s): NA, K, CL, CO2, GLUCOSE, BUN, CREATININE, CALCIUM in the last 72 hours. ? ? ?Intake/Output Summary (Last 24 hours) at 01/31/2022 0853 ?Last data filed at 01/31/2022 [id number] ?Gross per 24 hour  ?Intake 578 ml  ?Output 1400 ml  ?Net -822 ml  ?  ? ?  ? ?Physical Exam: ?Vital Signs ?Blood pressure 139/74, pulse (!) 55, temperature 98.1 ?F (36.7 ?C), resp. rate 18, height 5\' 9"  (1.753 m), weight 74.2 kg, SpO2 98 %. ?Physical Exam ?Gen: no distress, normal appearing, BMI 24.16, fatigued (generalized) ?HEENT: oral mucosa pink and moist, NCAT ?Cardio: Bradycardia ?Chest: normal effort, normal rate of breathing ?Abd: soft, non-distended ?Ext: no edema, TED garments in place ?Psych: pleasant, normal affect and behavior ? ?Skin: intact ?Neurological/MSK ?   Comments: Patient is alert and oriented x3. Able to converse well and can follow basic commands. ?Motor strength: 2/5 RUE with the exception of 3/5 EF and hand grip. 3/5 RLE except for 0/5 EHL and DF. Sensation is intact. Slow to move fingers of right hand. ?AFO in place RLE. Decreased insight into deficits ?Ambulating CG, using 1 handrail on left side for support on stairs ? ? ?Assessment/Plan: ?1. Functional deficits which require 3+ hours per day of interdisciplinary therapy in a comprehensive inpatient rehab setting. ?Physiatrist is providing close team supervision and 24 hour management of active medical problems listed below. ?Physiatrist and rehab team continue to assess barriers to  discharge/monitor patient progress toward functional and medical goals ? ?Care Tool: ? ?Bathing ?   ?Body parts bathed by patient: Right arm, Face, Chest, Abdomen, Front perineal area, Buttocks, Right upper leg, Left upper leg, Right lower leg, Left lower leg  ? Body parts bathed by helper: Left arm ?  ?  ?Bathing assist Assist Level: Minimal Assistance - Patient > 75% ?  ?  ?Upper Body Dressing/Undressing ?Upper body dressing   ?What is the patient wearing?: Pull over shirt ?   ?Upper body assist Assist Level: Minimal Assistance - Patient > 75% ?   ?Lower Body Dressing/Undressing ?Lower body dressing ? ? ?   ?What is the patient wearing?: Pants ? ?  ? ?Lower body assist Assist for lower body dressing: Minimal Assistance - Patient > 75% ?   ? ?Toileting ?Toileting Toileting Activity did not occur (Probation officer and hygiene only): N/A (no void or bm)  ?Toileting assist Assist for toileting: Independent with assistive device ?Assistive Device Comment: Urinal ?  ?Transfers ?Chair/bed transfer ? ?Transfers assist ?   ? ?Chair/bed transfer assist level: Contact Guard/Touching assist ?  ?  ?Locomotion ?Ambulation ? ? ?Ambulation assist ? ?   ? ?Assist level: Contact Guard/Touching assist ?Assistive device: Walker-rolling (RAFO) ?Max distance: 90  ? ?Walk 10 feet activity ? ? ?Assist ?   ? ?Assist level:  Contact Guard/Touching assist ?Assistive device: Walker-rolling  ? ?Walk 50 feet activity ? ? ?Assist   ? ?Assist level: Contact Guard/Touching assist ?Assistive device: Walker-rolling  ? ? ?Walk 150 feet activity ? ? ?Assist   ? ?Assist level: Minimal Assistance - Patient > 75% ?Assistive device: Walker-rolling ?  ? ?Walk 10 feet on uneven surface  ?activity ? ? ?Assist Walk 10 feet on uneven surfaces activity did not occur: Safety/medical concerns ? ? ?  ?   ? ?Wheelchair ? ? ? ? ?Assist Is the patient using a wheelchair?: Yes ?Type of Wheelchair: Manual ?Wheelchair activity did not occur: N/A ? ?Wheelchair assist  level: Supervision/Verbal cueing ?Max wheelchair distance: 25  ? ? ?Wheelchair 50 feet with 2 turns activity ? ? ? ?Assist ? ?  ?Wheelchair 50 feet with 2 turns activity did not occur: N/A ? ? ?   ? ?Wheelchair 150 feet activity  ? ? ? ?Assist ? Wheelchair 150 feet activity did not occur: N/A ? ? ?   ? ?Blood pressure 139/74, pulse (!) 55, temperature 98.1 ?F (36.7 ?C), resp. rate 18, height 5\' 9"  (1.753 m), weight 74.2 kg, SpO2 98 %. ? ? ? Medical Problem List and Plan: ?1. 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            -patient may shower ?            -ELOS/Goals: 2.5 weeks , d/c date 5/26 ?            -Continue CIR ?2.  Impaired mobility, ambulating 150 feet d/c Lovenox ?            -antiplatelet therapy: Aspirin 81 mg daily and Plavix 75 mg daily with initial plan x3 weeks then aspirin alone ?3. Pain Management: Tylenol as needed ?4. Insomnia: resolved, increase magnesium gluconate to 500mg  HS ?5. Neuropsych: This patient is capable of making decisions on his own behalf. ?6. Skin/Wound Care: Routine skin checks ?7. Fluids/Electrolytes/Nutrition: Routine in and outs with follow-up chemistries ?8.  Chronic diastolic congestive heart failure.  Follows with Dr. Johnsie Cancel.  Continue Entresto 24-26 mg twice daily.  Monitor for any signs of fluid overload ?9.  Hypertension.Increase Bidil to tabs of 20-37.5 mg 3 times daily, Toprol 100 mg daily.  Monitor with increased mobility. Provided list of foods to help with blood pressure. Discontinued order for TEDs. Increase magnesium gluconate to 500mg  HS ?10.  Diabetes mellitus.  Hemoglobin A1c 7.1.  Increase Farxiga to 10 mg daily.  Diabetic teaching. Recommended avoiding added sugar. Provided education to avoid foods with added sugar. Reviewed CBGs [id number] and discussed with patient. Continue carb modified diet.  ? ?CBG (last 3)  ?Recent Labs   ?  01/30/22 ?1701 01/30/22 ?2108 01/31/22 ?[id number]  ?GLUCAP 105* 145* 111*  ? ?11.  Hyperlipidemia. continue Lipitor ?12.  CKD stage III.  Follow-up chemistries. Cr 1.35 on most recent check.  ?13.  Medical noncompliance.  Counseling ?14. Bradycardia: resolved, continue to monitor HR TID. Will maintain Toprol given HTN.  ? ? ?  01/31/2022  ?  5:23 AM 01/30/2022  ?  7:44 PM 01/30/2022  ?  1:03 PM  ?Vitals with BMI  ?Systolic [id number]  ?Diastolic 74 80 58  ?Pulse 55 59 60  ?  ?15. History of being overweight: BMI improved this hospitalization from 26.45 to 24.16. provide dietary education. Provided list of  foods to assist in weight loss.  ?16. RLE foot drop: AFO ordered and applied. Getting newer better fitting AFO for her.  ?17. Vitamin D deficiency: continue ergocalciferol 50,000U once per week for 7 weeks. Discussed with patient and he is agreeable ?5/13 Continue supplementing with ergocalciferol 50,000 U once a week for 7 weeks. ?18. Poor safety awareness: continue to provide education ?5/13 Continue to provide education. ?19. Fatigue: discussed that Vitamin D supplement can help this. B12 is also at low end of normal, 1,000mg  injected today (5/12). Iron level checked and normal.  ?20. 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 Impulsivity: continue to reinforce importance of safety precautions to avoid falls. Continue bed alarm and safety measures. ? ? ?LOS: ?9 days ?A FACE TO FACE EVALUATION WAS PERFORMED ? ?Martha Clan P Lucile Didonato ?01/31/2022, 8:53 AM  ? ?  ?

## 2022-02-01 LAB — BASIC METABOLIC PANEL
Anion gap: 6 (ref 5–15)
BUN: 26 mg/dL — ABNORMAL HIGH (ref 8–23)
CO2: 27 mmol/L (ref 22–32)
Calcium: 8.9 mg/dL (ref 8.9–10.3)
Chloride: 106 mmol/L (ref 98–111)
Creatinine, Ser: 1.48 mg/dL — ABNORMAL HIGH (ref 0.61–1.24)
GFR, Estimated: 49 mL/min — ABNORMAL LOW (ref >60)
Glucose, Bld: 144 mg/dL — ABNORMAL HIGH (ref 70–99)
Potassium: 3.8 mmol/L (ref 3.5–5.1)
Sodium: 139 mmol/L (ref 135–145)

## 2022-02-01 LAB — GLUCOSE, CAPILLARY
Glucose-Capillary: 124 mg/dL — ABNORMAL HIGH (ref 70–99)
Glucose-Capillary: 112 mg/dL — ABNORMAL HIGH (ref 70–99)
Glucose-Capillary: 152 mg/dL — ABNORMAL HIGH (ref 70–99)
Glucose-Capillary: 172 mg/dL — ABNORMAL HIGH (ref 70–99)

## 2022-02-01 LAB — MAGNESIUM: Magnesium: 2.3 mg/dL (ref 1.7–2.4)

## 2022-02-01 MED ORDER — MELATONIN 3 MG PO TABS
3.0000 mg | ORAL_TABLET | Freq: Every day | ORAL | Status: DC
  Administered 2022-02-01 – 2022-02-04 (×4): 3 mg via ORAL
  Filled 2022-02-01 (×7): qty 1

## 2022-02-01 NOTE — Progress Notes (Signed)
PROGRESS NOTE   Subjective/Complaints: Slept poorly again last night. Joseph Patrick is taking him outside today. Discussed how circadian rhythm can get disregulated in hospital   ROS: +fatigue-improved, +right sided weakness-improving, negative for dizziness or pain. +heaviness in right side in morning, +insomnia  Objective:   No results found. No results for input(s): WBC, HGB, HCT, PLT in the last 72 hours.  No results for input(s): NA, K, CL, CO2, GLUCOSE, BUN, CREATININE, CALCIUM in the last 72 hours.   Intake/Output Summary (Last 24 hours) at 02/01/2022 1253 Last data filed at 02/01/2022 0900 Gross per 24 hour  Intake 297 ml  Output 1000 ml  Net -703 ml        Physical Exam: Vital Signs Blood pressure (!) 145/73, pulse 61, temperature 98.3 F (36.8 C), resp. rate 18, height 5\' 9"  (1.753 m), weight 74.4 kg, SpO2 98 %. Physical Exam Gen: no distress, normal appearing, BMI 24.16, fatigued (generalized) HEENT: oral mucosa pink and moist, NCAT Cardio: Bradycardia Chest: normal effort, normal rate of breathing Abd: soft, non-distended Ext: no edema, TED garments in place Psych: pleasant, normal affect and behavior  Skin: intact Neurological/MSK    Comments: Patient is alert and oriented x3. Able to converse well and can follow basic commands. Motor strength: 2/5 RUE with the exception of 3/5 EF and hand grip. 3/5 RLE except for 0/5 EHL and DF. Sensation is intact. Slow to move fingers of right hand. AFO in place RLE. Decreased insight into deficits Ambulating CG, using 1 handrail on left side for support on stairs +perseveration on weakness   Assessment/Plan: 1. Functional deficits which require 3+ hours per day of interdisciplinary therapy in a comprehensive inpatient rehab setting. Physiatrist is providing close team supervision and 24 hour management of active medical problems listed below. Physiatrist and rehab  team continue to assess barriers to discharge/monitor patient progress toward functional and medical goals  Care Tool:  Bathing    Body parts bathed by patient: Right arm, Face, Chest, Abdomen, Front perineal area, Buttocks, Right upper leg, Left upper leg, Right lower leg, Left lower leg   Body parts bathed by helper: Left arm     Bathing assist Assist Level: Minimal Assistance - Patient > 75%     Upper Body Dressing/Undressing Upper body dressing   What is the patient wearing?: Pull over shirt    Upper body assist Assist Level: Minimal Assistance - Patient > 75%    Lower Body Dressing/Undressing Lower body dressing      What is the patient wearing?: Pants     Lower body assist Assist for lower body dressing: Minimal Assistance - Patient > 75%     Toileting Toileting Toileting Activity did not occur (Clothing management and hygiene only): N/A (no void or bm)  Toileting assist Assist for toileting: Independent with assistive device Assistive Device Comment: Urinal   Transfers Chair/bed transfer  Transfers assist     Chair/bed transfer assist level: Contact Guard/Touching assist     Locomotion Ambulation   Ambulation assist      Assist level: Contact Guard/Touching assist Assistive device: Walker-rolling (RAFO) Max distance: 90   Walk 10 feet activity   Assist  Assist level: Contact Guard/Touching assist Assistive device: Walker-rolling   Walk 50 feet activity   Assist    Assist level: Contact Guard/Touching assist Assistive device: Walker-rolling    Walk 150 feet activity   Assist    Assist level: Minimal Assistance - Patient > 75% Assistive device: Walker-rolling    Walk 10 feet on uneven surface  activity   Assist Walk 10 feet on uneven surfaces activity did not occur: Safety/medical concerns         Wheelchair     Assist Is the patient using a wheelchair?: Yes Type of Wheelchair: Manual Wheelchair activity did  not occur: N/A  Wheelchair assist level: Supervision/Verbal cueing Max wheelchair distance: 25    Wheelchair 50 feet with 2 turns activity    Assist    Wheelchair 50 feet with 2 turns activity did not occur: N/A       Wheelchair 150 feet activity     Assist  Wheelchair 150 feet activity did not occur: N/A       Blood pressure (!) 145/73, pulse 61, temperature 98.3 F (36.8 C), resp. rate 18, height 5\' 9"  (1.753 m), weight 74.4 kg, SpO2 98 %.    Medical Problem List and Plan: 1. 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  Impaired mobility, ambulating 150 feet d/c Lovenox             -antiplatelet therapy: Aspirin 81 mg daily and Plavix 75 mg daily with initial plan x3 weeks then aspirin alone 3. Pain Management: Tylenol as needed 4. Insomnia: resolved, increase magnesium gluconate to 500mg  HS 5. Neuropsych: This patient is capable of making decisions on his own behalf. 6. Skin/Wound Care: Routine skin checks 7. Fluids/Electrolytes/Nutrition: Routine in and outs with follow-up chemistries 8.  Chronic diastolic congestive heart failure.  Follows with Dr. Johnsie Cancel.  Continue Entresto 24-26 mg twice daily.  Monitor for any signs of fluid overload 9.  Hypertension.Increase Bidil to tabs of 20-37.5 mg 3 times daily, Toprol 100 mg daily.  Monitor with increased mobility. Provided list of foods to help with blood pressure. Discontinued order for TEDs. Increase magnesium gluconate to 500mg  HS. Check BMP and magnesium level today.  10.  Diabetes mellitus.  Hemoglobin A1c 7.1.  Increase Farxiga to 10 mg daily.  Diabetic teaching. Recommended avoiding added sugar. Provided education to avoid foods with added sugar. Reviewed CBGs [id number] and  discussed with patient. Continue carb modified diet.   CBG (last 3)  Recent Labs    01/31/22 2101 02/01/22 0543 02/01/22 1158  GLUCAP 127* 124* 112*   11.  Hyperlipidemia. continue Lipitor 12.  CKD stage III.  Follow-up chemistries. Cr 1.35 on most recent check.  13.  Medical noncompliance.  Counseling 14. Bradycardia: resolved, continue to monitor HR TID. Will maintain Toprol given HTN.      02/01/2022    5:00 AM 02/01/2022    3:18 AM 01/31/2022    8:10 PM  Vitals with BMI  Weight 164 lbs    BMI [id number]    Systolic  [id number]  Diastolic  73 75  Pulse  61 55  15. History of being overweight: BMI improved this hospitalization from 26.45 to 24.16. provide dietary education. Provided list of foods to assist in weight loss.  16. RLE foot drop: AFO ordered and applied. Getting newer better fitting AFO for her.  17. Vitamin D deficiency: continue ergocalciferol 50,000U once per week for 7 weeks. Discussed with patient and he is agreeable 18. Poor safety awareness: continue to provide education 19. Fatigue: discussed that Vitamin D supplement can help this. B12 is also at low end of normal, 1,000mg  injected today (5/12). Iron level checked and normal. Discussed with patient.  20. History of TBI with residual cognitive deficits: continue SLP 21. Impulsivity: continue to reinforce importance of safety precautions to avoid falls. Continue bed alarm and safety measures. 22. Insomnia: start melatonin 3mg  HS   LOS: 10 days A FACE TO FACE EVALUATION WAS PERFORMED  Clide Deutscher Jyrah Blye 02/01/2022, 12:53 PM

## 2022-02-01 NOTE — Progress Notes (Signed)
Recreational Therapy Session Note  Patient Details  Name: Joseph Patrick MRN: [id number] Date of Birth: Jul 03, 1946 Today's Date: 02/01/2022  Pt politely declining participation in therapy today as pt states he did not sleep last night and he didn't feel up for therapy this afternoon, is awaiting meds to assist in relaxation and sleep per conversation with MD this morning.  [place] 02/01/2022, 2:43 PM

## 2022-02-01 NOTE — Progress Notes (Signed)
Physical Therapy Session Note  Patient Details  Name: Joseph Patrick MRN: [id number] Date of Birth: 1946-08-06  Today's Date: 02/01/2022 PT Individual Time: 0915-1025 PT Individual Time Calculation (min): 70 min   Short Term Goals: Week 2:  PT Short Term Goal 1 (Week 2): STG = LTG due to ELOS  Skilled Therapeutic Interventions/Progress Updates:     Pt sleeping in bed on arrival, awakens easily to voice and is agreeable to therapy session. Denies pain. Pt with tennis shoes and R AFO already on. Bed mobility completed mod I. Pt reporting fatigue from poor nights rest, requesting light activity. Sit<>stand to [facility] with CGA and ambulated outside near [facility] with CGA and QC, >370f. While outdoors, focused a lot on education for home safety, energy conservation strategies, general dietary/nutrition recommendations, adaptations for transportation, fall recovery, role of f/u therapies, and speaking to MD prior to returning to driving. We also worked on gPersonnel officeroutdoors on unlevel concrete surfaces - required minA for safely descending sloped areas and CGA for all other mobility, using his QC. Gait outdoors >2074fdistances. Ambulated back to CIR floor with CGA and QC using elevators. Completed x7m48mtes of Nustep with R hand ace wrapped to promote ROM and strengthening - resistance set to 4. Ambulated back to his room, ~100f64fith CGA and QC - concluded session seated EOB with call bell in reach, all personal items in reach, all needs met.   Therapy Documentation Precautions:  Precautions Precautions: Fall Precaution Comments: R hemiparesis and foot drop, decreased insight and questionable safety awareness Restrictions Weight Bearing Restrictions: No General:    Therapy/Group: Individual Therapy  [name] 02/01/2022, 7:24 AM

## 2022-02-01 NOTE — Progress Notes (Signed)
Occupational Therapy Session Note  Patient Details  Name: Joseph Patrick MRN: [id number] Date of Birth: 05-24-1946  Today's Date: 02/01/2022 OT Individual Time: 4034-7425 OT Individual Time Calculation (min): 73 min    Short Term Goals: Week 2:  OT Short Term Goal 1 (Week 2): STGs=LTGs  Skilled Therapeutic Interventions/Progress Updates:    Subjective: Pt perseverating this morning on feeling weak although presenting at same level as yesterday. He also states that he only slept for 3 hours last night off and on and feels like he could fall asleep right now. Made MD and PT aware.  Pt having difficulty attending to tasks due to perseveration and internal distraction. Requesting to shower.     Objective:  First session: Pt sitting EOB.Doffed shirt with mod I.  Sit to stand and ambulation to bathroom with CGA.  Stand pivot to shower bench in walk in shower with min assist.Doffed shorts at sit<>stand with CGA.  Doffed socks with supervision.  Bathed UB and LB with close supervision and encouraged pt to utilize RUE functionally to wash as able however pt insisting he cant because he feels weak exhibiting self limiting behavior.  Pt also needing frequent cues to attend to RUE during bathing.  Pt also needing cue to slow down and dry off body before ambulating out of bathroom. Ambulated to EOB using quad cane with CGA.  Pt donned shirt and shorts with close supervision.  Ambulated to kitchen and attempted functional reach, grasp, and place medium fruit from counter to large bowl.  Pt needing frequent redirection due to significant internal distraction and perseverating on RUE feeling weak. Ambulated back to room with CGA.  Sit to supine in bed with mod I.  Call bell in reach, seat alarm on.   Second session: Pt sitting EOB and agreeable to going outside for OT session.  Pt ambulated using quad cane with CGA >250 ft to [facility] using elevator to get downstairs.  Pt completed neuro re-ed RUE including weight bearing  through arm to push up from bench grasping rail.  Thumb AROM Palmar abduction with target and attempting opposition to each digit.  3 x 10 reps.  Ambulated >250 feet using quad cane back to room and returned to sitting EOB with CGA. Call bell in reach, seat alarm on.    Assessment:  Pt making progress evidenced by increased independence with self care during bathing and dressing .  Primary barriers today included internal distraction and perseverative in nature which limited pts performance and participation throughout.   Plan: Pt would benefit from further training on [facility] right hand.   Therapy Documentation Precautions:  Precautions Precautions: Fall Precaution Comments: R hemiparesis and foot drop, decreased insight and questionable safety awareness Restrictions Weight Bearing Restrictions: No    Therapy/Group: Individual Therapy  Amie Critchley 02/01/2022, 9:26 AM

## 2022-02-02 LAB — GLUCOSE, CAPILLARY
Glucose-Capillary: 98 mg/dL (ref 70–99)
Glucose-Capillary: 123 mg/dL — ABNORMAL HIGH (ref 70–99)
Glucose-Capillary: 140 mg/dL — ABNORMAL HIGH (ref 70–99)
Glucose-Capillary: 162 mg/dL — ABNORMAL HIGH (ref 70–99)

## 2022-02-02 MED ORDER — POTASSIUM CHLORIDE 20 MEQ PO PACK
20.0000 meq | PACK | Freq: Every day | ORAL | Status: DC
Start: 2022-02-02 — End: 2022-02-07
  Administered 2022-02-02 – 2022-02-06 (×5): 20 meq via ORAL
  Filled 2022-02-02 (×5): qty 1

## 2022-02-02 NOTE — Progress Notes (Signed)
Physical Therapy Session Note  Patient Details  Name: Joseph Patrick MRN: [id number] Date of Birth: 1946/06/18  Today's Date: 02/02/2022 PT Individual Time: 1100-1157 + 1415-1525 PT Individual Time Calculation (min): 57 min  + 70 min  Short Term Goals: Week 2:  PT Short Term Goal 1 (Week 2): STG = LTG due to ELOS  Skilled Therapeutic Interventions/Progress Updates:     1st session: Pt sitting in recliner to start and agreeable to PT tx. Denies pain. Has R AFO and tennis shoes on. Sit<>stand to QC with close supervision - relies on back of legs against surface to stand. Ambulates ~162f with close supervision and QC to main rehab gym - some compensation with valuting on L and mild circumduction on R but no knee buckling or LOB while turning.   Focused remainder of session on NMR for LUE strengthening and dynamic standing balance. Completed rebounder toss 2x30 with volleyball - CGA/minA for balance and encouraged involvement of paretic RUE throughout task. Continued RUE and dynamic standing balance challenges with punching bag and supervision for balance - ace wrapped R wrist to reduce risk of injury while punching bag - completing side jabs, forward jabs, and hammer hits. We also completed seated tricep ext using yellow TB. Pt pleased with his improvement in RUE strengthening from these exercises.   Ambulated back to his room with close supervision and QC. Concluded session seated in recliner with chair alarm on, call bell in reach, all needs met.   2nd session: Pt sitting in recliner - agreeable to PT tx Has R AFO and tennis shoes on. Reports concern with fatigue from RUE from previous session above. Discussed typical muscular fatigue with stroke recovery during intense CIR therapies.   Sit<>stand to QC with supervision - ambulates to main rehab gym, ~1588fwith supervision and QC. Worked on stair training without hand rails, using LBQC and minA. Navigated up/down 2x8, 6inch steps with step-to  pattern. Required cues for correct sequencing and safety (pt unable to recall ascending with L foot and descending with R foot).   Standing there-ex in // bars with BUE support to bars and mirror for visual feedback, CGA for safety. - 1x15 mini squats with emphasis on neutral knee and RLE strengthening - 4x15second hold "wall sits" in deep squatting - 2x15 standing hip abduction with 4# ankle weight - 2x15 standing high knees on R with 4# ankle weight - 2x15 lateral step ups to 4inch platform with 4# ankle weight on RLE   Standing on inverted bosu ball for proprioception and NMR -eyes open/closed with feet shldr width apart -eyes open/closed with perturbations to bosu ball  Ambulated back to his room with supervision and QC, ~[id number] session seated in recliner with call bell in reach and all needs met. Encouraged him to remove AFO to allow time off with brace while resting in recliner.   Therapy Documentation Precautions:  Precautions Precautions: Fall Precaution Comments: R hemiparesis and foot drop, decreased insight and questionable safety awareness Restrictions Weight Bearing Restrictions: No General:    Therapy/Group: Individual Therapy  ChrAlger Simons19/2023, 7:34 AM

## 2022-02-02 NOTE — Progress Notes (Signed)
Occupational Therapy Session Note  Patient Details  Name: Joseph Patrick MRN: [id number] Date of Birth: 1946-07-12  Today's Date: 02/02/2022 OT Individual Time: 0845-1000 OT Individual Time Calculation (min): 75 min    Short Term Goals: Week 1:  OT Short Term Goal 1 (Week 1): Pt will complete standing grooming task at sink with CGA. OT Short Term Goal 1 - Progress (Week 1): Met OT Short Term Goal 2 (Week 1): Pt will complete shower transfer with min A. OT Short Term Goal 2 - Progress (Week 1): Met OT Short Term Goal 3 (Week 1): Pt will therapeutically position RUE when seated in chair/bed with no more than min VCs. OT Short Term Goal 3 - Progress (Week 1): Met OT Short Term Goal 4 (Week 1): Pt will don shirt with S. OT Short Term Goal 4 - Progress (Week 1): Met   Skilled Therapeutic Interventions/Progress Updates:    Subjective: "Lets go work on my arm"; Pt reports sleeping better last night; no c/o pain.    Objective:  Pt sitting EOB.Ambulated ~75 feet to dayroom using quad cane with CGA.  Nustep x 8 minutes primarily propelling with RUE and RLE with no resistance with hand acewrapped to keep grip.  Pt ambulated ~10 feet to EOM and completed neuro re-ed RUE. Stabilization push/pull without assist on medium sized ball 3x10 reps. Right shoulder ER/IR tap and holds using sticky notes as targets to ensure elbow flexion maintained. 3 x 10 reps.  Digital composite extension with education on giving increased time to allow for processing and motor output due to patient rushing through movements and not allowing for quality.  Wrist extension isolations (closed fist wrapped with coban) with target for external focus with FES applied on following Settings: 35 Mhz, 300 [place], 2 sec ramp up/down, 1:2 on/off ratio.Ambulated back to room with CGA using quad cane needing min intermittent cues to increase foot clearance RLE. Sitting EOB at end of session. Call bell in reach, seat alarm on.     Therapy  Documentation Precautions:  Precautions Precautions: Fall Precaution Comments: R hemiparesis and foot drop, decreased insight and questionable safety awareness Restrictions Weight Bearing Restrictions: No    Therapy/Group: Individual Therapy  Ezekiel Slocumb 02/02/2022, 1:35 PM

## 2022-02-02 NOTE — Progress Notes (Signed)
PROGRESS NOTE   Subjective/Complaints: Slept well last night Has no new concerns Still fatigued, worse in the morning and improves throughout the day  ROS: +fatigue-improved, +right sided weakness-improving, negative for dizziness or pain. +heaviness in right side in morning, +insomnia- resolved  Objective:   No results found. No results for input(s): WBC, HGB, HCT, PLT in the last 72 hours.  Recent Labs    02/01/22 1354  NA 139  K 3.8  CL 106  CO2 27  GLUCOSE 144*  BUN 26*  CREATININE 1.48*  CALCIUM 8.9     Intake/Output Summary (Last 24 hours) at 02/02/2022 1114 Last data filed at 02/02/2022 0818 Gross per 24 hour  Intake 596 ml  Output 450 ml  Net 146 ml        Physical Exam: Vital Signs Blood pressure (!) 126/56, pulse (!) 55, temperature 97.9 F (36.6 C), resp. rate 17, height 5\' 9"  (1.753 m), weight 74.4 kg, SpO2 98 %. Physical Exam Gen: no distress, normal appearing, BMI 24.16, fatigued (generalized) HEENT: oral mucosa pink and moist, NCAT Cardio: Bradycardia Chest: normal effort, normal rate of breathing Abd: soft, non-distended Ext: no edema, TED garments in place Psych: pleasant, normal affect and behavior  Skin: intact Neurological/MSK    Comments: Patient is alert and oriented x3. Able to converse well and can follow basic commands. Decreased safety awareness Motor strength: 2/5 RUE with the exception of 3/5 EF and hand grip. 3/5 RLE except for 0/5 EHL and DF. Sensation is intact. Slow to move fingers of right hand. AFO in place RLE. Decreased insight into deficits Ambulating CG, using 1 handrail on left side for support on stairs +perseveration on weakness   Assessment/Plan: 1. Functional deficits which require 3+ hours per day of interdisciplinary therapy in a comprehensive inpatient rehab setting. Physiatrist is providing close team supervision and 24 hour management of active medical  problems listed below. Physiatrist and rehab team continue to assess barriers to discharge/monitor patient progress toward functional and medical goals  Care Tool:  Bathing    Body parts bathed by patient: Right arm, Face, Chest, Abdomen, Front perineal area, Buttocks, Right upper leg, Left upper leg, Right lower leg, Left lower leg   Body parts bathed by helper: Left arm     Bathing assist Assist Level: Minimal Assistance - Patient > 75%     Upper Body Dressing/Undressing Upper body dressing   What is the patient wearing?: Pull over shirt    Upper body assist Assist Level: Minimal Assistance - Patient > 75%    Lower Body Dressing/Undressing Lower body dressing      What is the patient wearing?: Pants     Lower body assist Assist for lower body dressing: Minimal Assistance - Patient > 75%     Toileting Toileting Toileting Activity did not occur (Clothing management and hygiene only): N/A (no void or bm)  Toileting assist Assist for toileting: Independent with assistive device Assistive Device Comment: Urinal   Transfers Chair/bed transfer  Transfers assist     Chair/bed transfer assist level: Contact Guard/Touching assist     Locomotion Ambulation   Ambulation assist      Assist level: Contact  Guard/Touching assist Assistive device: Walker-rolling (RAFO) Max distance: 90   Walk 10 feet activity   Assist     Assist level: Contact Guard/Touching assist Assistive device: Walker-rolling   Walk 50 feet activity   Assist    Assist level: Contact Guard/Touching assist Assistive device: Walker-rolling    Walk 150 feet activity   Assist    Assist level: Minimal Assistance - Patient > 75% Assistive device: Walker-rolling    Walk 10 feet on uneven surface  activity   Assist Walk 10 feet on uneven surfaces activity did not occur: Safety/medical concerns         Wheelchair     Assist Is the patient using a wheelchair?: Yes Type  of Wheelchair: Manual Wheelchair activity did not occur: N/A  Wheelchair assist level: Supervision/Verbal cueing Max wheelchair distance: 25    Wheelchair 50 feet with 2 turns activity    Assist    Wheelchair 50 feet with 2 turns activity did not occur: N/A       Wheelchair 150 feet activity     Assist  Wheelchair 150 feet activity did not occur: N/A       Blood pressure (!) 126/56, pulse (!) 55, temperature 97.9 F (36.6 C), resp. rate 17, height 5\' 9"  (1.753 m), weight 74.4 kg, SpO2 98 %.    Medical Problem List and Plan: 1. 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  Impaired mobility, ambulating 150 feet d/c Lovenox             -antiplatelet therapy: Aspirin 81 mg daily and Plavix 75 mg daily with initial plan x3 weeks then aspirin alone 3. Pain Management: Tylenol as needed 4. Insomnia: resolved, increase magnesium gluconate to 500mg  HS 5. Neuropsych: This patient is capable of making decisions on his own behalf. 6. Skin/Wound Care: Routine skin checks 7. Fluids/Electrolytes/Nutrition: Routine in and outs with follow-up chemistries 8.  Chronic diastolic congestive heart failure.  Follows with Dr. Johnsie Cancel.  Continue Entresto 24-26 mg twice daily.  Monitor for any signs of fluid overload 9.  Hypertension.Increase Bidil to tabs of 20-37.5 mg 3 times daily, Toprol 100 mg daily.  Monitor with increased mobility. Provided list of foods to help with blood pressure. Discontinued order for TEDs. Increase magnesium gluconate to 500mg  HS. Check BMP and magnesium level today.  10.  Diabetes mellitus.  Hemoglobin A1c 7.1.  Increase Farxiga to 10 mg daily.  Diabetic teaching. Recommended avoiding added sugar. Provided education to avoid foods  with added sugar. Reviewed CBGs [id number] and discussed with patient. Continue carb modified diet.   CBG (last 3)  Recent Labs    02/01/22 1655 02/01/22 2031 02/02/22 0535  GLUCAP 152* 172* 98   11.  Hyperlipidemia. continue Lipitor 12.  CKD stage III.  Follow-up chemistries. Cr 1.35 on most recent check.  13.  Medical noncompliance.  Counseling 14. Bradycardia: resolved, continue to monitor HR TID. Will maintain Toprol given HTN.      02/02/2022    3:14 AM 02/01/2022    7:15 PM 02/01/2022    1:34 PM  Vitals with BMI  Systolic [id number]  Diastolic 56 67 71  Pulse 55 61 52    15. History of being overweight: BMI improved this hospitalization from 26.45 to 24.16. provide dietary education. Provided list of foods to assist in weight loss.  16. RLE foot drop: AFO ordered and applied. Getting newer better fitting AFO for her.  17. Vitamin D deficiency: continue ergocalciferol 50,000U once per week for 7 weeks. Discussed with patient and he is agreeable 18. Poor safety awareness: continue to provide education 19. Fatigue: discussed that Vitamin D supplement can help this. B12 is also at low end of normal, 1,000mg  injected today (5/12). Iron level checked and normal. Discussed with patient.  20. History of TBI with residual cognitive deficits: continue SLP 21. Impulsivity: continue to reinforce importance of safety precautions to avoid falls. Continue bed alarm and safety measures. 22. Insomnia: continue melatonin 3mg  HS. Grounds pass ordered for sunlight exposure during the day 23. Suboptimal potassium: supplement 11meq klor daily   LOS: 11 days A FACE TO FACE EVALUATION WAS PERFORMED  Makoto Sellitto P Envy Meno 02/02/2022, 11:14 AM

## 2022-02-03 LAB — BASIC METABOLIC PANEL
Anion gap: 5 (ref 5–15)
BUN: 22 mg/dL (ref 8–23)
CO2: 26 mmol/L (ref 22–32)
Calcium: 8.8 mg/dL — ABNORMAL LOW (ref 8.9–10.3)
Chloride: 107 mmol/L (ref 98–111)
Creatinine, Ser: 1.47 mg/dL — ABNORMAL HIGH (ref 0.61–1.24)
GFR, Estimated: 49 mL/min — ABNORMAL LOW (ref >60)
Glucose, Bld: 127 mg/dL — ABNORMAL HIGH (ref 70–99)
Potassium: 3.9 mmol/L (ref 3.5–5.1)
Sodium: 138 mmol/L (ref 135–145)

## 2022-02-03 LAB — GLUCOSE, CAPILLARY
Glucose-Capillary: 130 mg/dL — ABNORMAL HIGH (ref 70–99)
Glucose-Capillary: 110 mg/dL — ABNORMAL HIGH (ref 70–99)
Glucose-Capillary: 100 mg/dL — ABNORMAL HIGH (ref 70–99)
Glucose-Capillary: 152 mg/dL — ABNORMAL HIGH (ref 70–99)

## 2022-02-03 MED ORDER — ISOSORB DINITRATE-HYDRALAZINE 20-37.5 MG PO TABS
2.0000 | ORAL_TABLET | Freq: Three times a day (TID) | ORAL | Status: AC
  Administered 2022-02-03: 2 via ORAL
  Filled 2022-02-03: qty 2

## 2022-02-03 MED ORDER — ISOSORB DINITRATE-HYDRALAZINE 20-37.5 MG PO TABS: 2.0000 | ORAL_TABLET | Freq: Three times a day (TID) | ORAL | Status: DC

## 2022-02-03 MED ORDER — HYDRALAZINE HCL 50 MG PO TABS
100.0000 mg | ORAL_TABLET | Freq: Three times a day (TID) | ORAL | Status: DC
  Administered 2022-02-04 – 2022-02-08 (×13): 100 mg via ORAL
  Filled 2022-02-03 (×13): qty 2

## 2022-02-03 MED ORDER — ISOSORBIDE MONONITRATE ER 30 MG PO TB24
120.0000 mg | ORAL_TABLET | Freq: Every day | ORAL | Status: DC
  Administered 2022-02-04 – 2022-02-08 (×5): 120 mg via ORAL
  Filled 2022-02-03 (×5): qty 4

## 2022-02-03 MED ORDER — METOPROLOL SUCCINATE ER 50 MG PO TB24
50.0000 mg | ORAL_TABLET | Freq: Every day | ORAL | Status: DC
  Administered 2022-02-04 – 2022-02-08 (×5): 50 mg via ORAL
  Filled 2022-02-03 (×5): qty 1

## 2022-02-03 NOTE — Plan of Care (Signed)
  Problem: Consults ?Goal: RH STROKE PATIENT EDUCATION ?Description: See Patient Education module for education specifics  ?Outcome: Progressing ?  ?Problem: RH SAFETY ?Goal: RH STG ADHERE TO SAFETY PRECAUTIONS W/ASSISTANCE/DEVICE ?Description: STG Adhere to Safety Precautions With cues Assistance/Device. ?Outcome: Progressing ?  ?Problem: RH KNOWLEDGE DEFICIT ?Goal: RH STG INCREASE KNOWLEDGE OF DIABETES ?Description: Patient and niece will be able to manage DM with medications and dietary modifications using handouts and educational resources independently ?Outcome: Progressing ?Goal: RH STG INCREASE KNOWLEDGE OF HYPERTENSION ?Description: Patient and niece will be able to manage HTN with medications and dietary modifications using handouts and educational resources independently ?Outcome: Progressing ?Goal: RH STG INCREASE KNOWLEGDE OF HYPERLIPIDEMIA ?Description: Patient and niece will be able to manage HLD with medications and dietary modifications using handouts and educational resources independently ?Outcome: Progressing ?Goal: RH STG INCREASE KNOWLEDGE OF STROKE PROPHYLAXIS ?Description: Patient and niece will be able to manage secondary stroke risks ? with medications and dietary modifications using handouts and educational resources independently ?Outcome: Progressing ?  ?Problem: Education: ?Goal: Ability to demonstrate management of disease process will improve ?Outcome: Progressing ?Goal: Ability to verbalize understanding of medication therapies will improve ?Outcome: Progressing ?Goal: Individualized Educational Video(s) ?Outcome: Progressing ?  ?

## 2022-02-03 NOTE — Progress Notes (Signed)
Physical Therapy Session Note  Patient Details  Name: Joseph Patrick MRN: [id number] Date of Birth: [birth year]  Today's Date: 02/03/2022 PT Individual Time: 0916-1004 PT Individual Time Calculation (min): 48 min   Short Term Goals: Week 1:  PT Short Term Goal 1 (Week 1): Pt will complete bed<>chair transfers with CGA and LRAD PT Short Term Goal 1 - Progress (Week 1): Met PT Short Term Goal 2 (Week 1): Pt will ambulate 119f with CGA and LRAD PT Short Term Goal 2 - Progress (Week 1): Met PT Short Term Goal 3 (Week 1): Pt will complete functional outcome measure to assess falls risk PT Short Term Goal 3 - Progress (Week 1): Met Week 2:  PT Short Term Goal 1 (Week 2): STG = LTG due to ELOS  Skilled Therapeutic Interventions/Progress Updates:   Pt received sitting in recliner and agreeable to PT. Pt performed sit<>stand transfer with QC and supervision assist for safety.   Gait training with QC and AFO [id number]-supervision assist with cues for improved HS activation with swing through on the RLE to prevent toe drag. Mild LOB x 3 throughout gait training due to toe drag but ableq to correct without additional Assist .   PT instructed pt in dynamic balance training while engaged in gross motor task of wii bowling standing on level ground x 4 frames and 5 frames staidng on airex pad. Min cues for improved WB through the RLE to improve symmetry of WB on BLE  Seated NMR for RLE HS curls with fott supported on floor scooter x 25 with min assist to prevent compensation for glutes. Sit<>stand x 10 with LLE on 3inch lift to force increaed WB on the RLE. Cues for increased anterior weight shift to prevent locking LLE on bed.   Patient returned to room and performed stand pivot to recliner with supervision assist and QC for safety. Pt left sitting in recliner with call bell in reach and all needs met.            Therapy Documentation Precautions:  Precautions Precautions:  Fall Precaution Comments: R hemiparesis and foot drop, decreased insight and questionable safety awareness Restrictions Weight Bearing Restrictions: No  Vital Signs: Therapy Vitals Pulse Rate: (!) 56 BP: (!) 158/71 Pain: Pain Assessment Pain Scale: 0-10 Pain Score: 0-No pain    Therapy/Group: Individual Therapy  AustLorie Phenix0/2023, 10:08 AM

## 2022-02-03 NOTE — Progress Notes (Signed)
PROGRESS NOTE   Subjective/Complaints: No new complaints this morning Reviewed his medications and their indications. Discussed 30 day supply on discharge  ROS: +fatigue-improved, +right sided weakness-improving, negative for dizziness or pain. +heaviness in right side- worst in mornings, +insomnia- resolved  Objective:   No results found. No results for input(s): WBC, HGB, HCT, PLT in the last 72 hours.  Recent Labs    02/01/22 1354 02/03/22 1051  NA 139 138  K 3.8 3.9  CL 106 107  CO2 27 26  GLUCOSE 144* 127*  BUN 26* 22  CREATININE 1.48* 1.47*  CALCIUM 8.9 8.8*     Intake/Output Summary (Last 24 hours) at 02/03/2022 1531 Last data filed at 02/03/2022 1248 Gross per 24 hour  Intake 336 ml  Output 2100 ml  Net -1764 ml        Physical Exam: Vital Signs Blood pressure (!) 161/69, pulse (!) 55, temperature 97.6 F (36.4 C), resp. rate 16, height 5\' 9"  (1.753 m), weight 76.4 kg, SpO2 100 %. Physical Exam Gen: no distress, normal appearing, BMI 24.16, fatigued (generalized) HEENT: oral mucosa pink and moist, NCAT Cardio: Bradycardia Chest: normal effort, normal rate of breathing Abd: soft, non-distended Ext: no edema, TED garments in place Psych: pleasant, normal affect and behavior  Skin: intact Neurological/MSK    Comments: Patient is alert and oriented x3. Able to converse well and can follow basic commands. Decreased safety awareness Motor strength: 2/5 RUE with the exception of 3/5 EF and hand grip. 3/5 RLE except for 0/5 EHL and DF. Sensation is intact. Slow to move fingers of right hand. AFO in place RLE. Decreased insight into deficits Ambulating CG, using 1 handrail on left side for support on stairs +perseveration on weakness   Assessment/Plan: 1. Functional deficits which require 3+ hours per day of interdisciplinary therapy in a comprehensive inpatient rehab setting. Physiatrist is providing  close team supervision and 24 hour management of active medical problems listed below. Physiatrist and rehab team continue to assess barriers to discharge/monitor patient progress toward functional and medical goals  Care Tool:  Bathing    Body parts bathed by patient: Right arm, Face, Chest, Abdomen, Front perineal area, Buttocks, Right upper leg, Left upper leg, Right lower leg, Left lower leg   Body parts bathed by helper: Left arm     Bathing assist Assist Level: Minimal Assistance - Patient > 75%     Upper Body Dressing/Undressing Upper body dressing   What is the patient wearing?: Pull over shirt    Upper body assist Assist Level: Minimal Assistance - Patient > 75%    Lower Body Dressing/Undressing Lower body dressing      What is the patient wearing?: Pants     Lower body assist Assist for lower body dressing: Minimal Assistance - Patient > 75%     Toileting Toileting Toileting Activity did not occur (Clothing management and hygiene only): N/A (no void or bm)  Toileting assist Assist for toileting: Independent with assistive device Assistive Device Comment: Urinal   Transfers Chair/bed transfer  Transfers assist     Chair/bed transfer assist level: Contact Guard/Touching assist     Locomotion Ambulation   Ambulation  assist      Assist level: Contact Guard/Touching assist Assistive device: Walker-rolling (RAFO) Max distance: 90   Walk 10 feet activity   Assist     Assist level: Contact Guard/Touching assist Assistive device: Walker-rolling   Walk 50 feet activity   Assist    Assist level: Contact Guard/Touching assist Assistive device: Walker-rolling    Walk 150 feet activity   Assist    Assist level: Minimal Assistance - Patient > 75% Assistive device: Walker-rolling    Walk 10 feet on uneven surface  activity   Assist Walk 10 feet on uneven surfaces activity did not occur: Safety/medical concerns          Wheelchair     Assist Is the patient using a wheelchair?: Yes Type of Wheelchair: Manual Wheelchair activity did not occur: N/A  Wheelchair assist level: Supervision/Verbal cueing Max wheelchair distance: 25    Wheelchair 50 feet with 2 turns activity    Assist    Wheelchair 50 feet with 2 turns activity did not occur: N/A       Wheelchair 150 feet activity     Assist  Wheelchair 150 feet activity did not occur: N/A       Blood pressure (!) 161/69, pulse (!) 55, temperature 97.6 F (36.4 C), resp. rate 16, height 5\' 9"  (1.753 m), weight 76.4 kg, SpO2 100 %.    Medical Problem List and Plan: 1. 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  Impaired mobility, ambulating 150 feet d/c Lovenox             -antiplatelet therapy: Aspirin 81 mg daily and Plavix 75 mg daily with initial plan x3 weeks then aspirin alone 3. Pain Management: Tylenol as needed 4. Insomnia: resolved, increase magnesium gluconate to 500mg  HS 5. Neuropsych: This patient is capable of making decisions on his own behalf. 6. Skin/Wound Care: Routine skin checks 7. Fluids/Electrolytes/Nutrition: Routine in and outs with follow-up chemistries 8.  Chronic diastolic congestive heart failure.  Follows with Dr. Johnsie Cancel.  Continue Entresto 24-26 mg twice daily.  Monitor for any signs of fluid overload 9.  Hypertension.Change Bidil to Imdur 120mg  daily starting tomorrow, Decrease Toprol XL to 50mg .  Increase Hydralazine to 100mg  TID starting tomorrow Monitor with increased mobility. Provided list of foods to help with blood pressure. Discontinued order for TEDs. Increase magnesium gluconate to 500mg  HS.  10.  Diabetes mellitus.  Hemoglobin A1c 7.1.  Increase Farxiga to  10 mg daily.  Diabetic teaching. Recommended avoiding added sugar. Provided education to avoid foods with added sugar. Reviewed CBGs [id number] and discussed with patient. Continue carb modified diet.   CBG (last 3)  Recent Labs    02/02/22 2103 02/03/22 0610 02/03/22 1152  GLUCAP 162* 130* 110*   11.  Hyperlipidemia. continue Lipitor 12.  CKD stage III.  Follow-up chemistries. Cr 1.35 on most recent check.  13.  Medical noncompliance.  Counseling 14. Bradycardia: resolved, continue to monitor HR TID. Will maintain Toprol given HTN.      02/03/2022    2:21 PM 02/03/2022    8:36 AM 02/03/2022    5:06 AM  Vitals with BMI  Systolic Q000111Q [id number]  Diastolic 69 71 67  Pulse 55 56 59    15. History of being overweight: BMI improved this hospitalization from 26.45 to 24.16. provide dietary education. Provided list of foods to assist in weight loss.  16. RLE foot drop: AFO ordered and applied. Getting newer better fitting AFO for her.  17. Vitamin D deficiency: continue ergocalciferol 50,000U once per week for 7 weeks. Discussed with patient and he is agreeable 18. Poor safety awareness: continue to provide education 19. Fatigue: discussed that Vitamin D supplement can help this. B12 is also at low end of normal, 1,000mg  injected today (5/12). Iron level checked and normal. Discussed with patient.  20. History of TBI with residual cognitive deficits: continue SLP 21. Impulsivity: continue to reinforce importance of safety precautions to avoid falls. Continue bed alarm and safety measures. 22. Insomnia: continue melatonin 3mg  HS. Grounds pass ordered for sunlight exposure during the day 23. Suboptimal potassium: continue 22meq klor daily 24. Bradycardia: decrease toprol XL to 50mg    LOS: 12 days A FACE TO FACE EVALUATION WAS PERFORMED  Clide Deutscher Egan Sahlin 02/03/2022, 3:31 PM

## 2022-02-04 LAB — GLUCOSE, CAPILLARY
Glucose-Capillary: 111 mg/dL — ABNORMAL HIGH (ref 70–99)
Glucose-Capillary: 96 mg/dL (ref 70–99)
Glucose-Capillary: 118 mg/dL — ABNORMAL HIGH (ref 70–99)
Glucose-Capillary: 153 mg/dL — ABNORMAL HIGH (ref 70–99)

## 2022-02-04 NOTE — Progress Notes (Signed)
Physical Therapy Session Note  Patient Details  Name: Joseph Patrick MRN: [id number] Date of Birth: 1946-04-10  Today's Date: 02/04/2022 PT Individual Time: 0904-1004 PT Individual Time Calculation (min): 60 min   Short Term Goals: Week 2:  PT Short Term Goal 1 (Week 2): STG = LTG due to ELOS  Skilled Therapeutic Interventions/Progress Updates: Pt presents sitting in recliner and agreeable to therapy.  Pt transfers sit to stand w/ supervision.  Pt w/ R AFO already donned.  Pt amb 150' w/ LBQC and CGA to close supervision.  Pt amb multiple trials w/ QC and close supervision w/ occasional clipping of R foot on L heel.  Pt requires verbal cues for sequencing and placement of QC for safety.  Pt amb multiple trials w/ SPC and CGA to close supervision w/ same verbal cues, but improved placement of cane.  Pt performed multiple sit to stand transfers from 22" height, 21" and 20" w/ cueing for initiation including forward lean and RLE use.  Pt transferred sit to stand  from higher surface and 3" platform under L foot w/ occasional min but CGA w/ verbal cues.  Pt amb back to room x 75' w/ SPC and CGA, carrying QC in R hand.  Pt remained sitting in recliner w/ all needsin reach.     Therapy Documentation Precautions:  Precautions Precautions: Fall Precaution Comments: R hemiparesis and foot drop, decreased insight and questionable safety awareness Restrictions Weight Bearing Restrictions: No General:   Vital Signs:   Pain:0/10 Pain Assessment Pain Scale: 0-10 Pain Score: 0-No pain Mobility:       Therapy/Group: Individual Therapy  [name] 02/04/2022, 10:04 AM

## 2022-02-04 NOTE — Plan of Care (Signed)
  Problem: Consults ?Goal: RH STROKE PATIENT EDUCATION ?Description: See Patient Education module for education specifics  ?Outcome: Progressing ?  ?Problem: RH SAFETY ?Goal: RH STG ADHERE TO SAFETY PRECAUTIONS W/ASSISTANCE/DEVICE ?Description: STG Adhere to Safety Precautions With cues Assistance/Device. ?Outcome: Progressing ?  ?Problem: RH KNOWLEDGE DEFICIT ?Goal: RH STG INCREASE KNOWLEDGE OF DIABETES ?Description: Patient and niece will be able to manage DM with medications and dietary modifications using handouts and educational resources independently ?Outcome: Progressing ?Goal: RH STG INCREASE KNOWLEDGE OF HYPERTENSION ?Description: Patient and niece will be able to manage HTN with medications and dietary modifications using handouts and educational resources independently ?Outcome: Progressing ?Goal: RH STG INCREASE KNOWLEGDE OF HYPERLIPIDEMIA ?Description: Patient and niece will be able to manage HLD with medications and dietary modifications using handouts and educational resources independently ?Outcome: Progressing ?Goal: RH STG INCREASE KNOWLEDGE OF STROKE PROPHYLAXIS ?Description: Patient and niece will be able to manage secondary stroke risks ? with medications and dietary modifications using handouts and educational resources independently ?Outcome: Progressing ?  ?Problem: Education: ?Goal: Ability to demonstrate management of disease process will improve ?Outcome: Progressing ?Goal: Ability to verbalize understanding of medication therapies will improve ?Outcome: Progressing ?Goal: Individualized Educational Video(s) ?Outcome: Progressing ?  ?

## 2022-02-04 NOTE — Progress Notes (Signed)
PROGRESS NOTE   Subjective/Complaints: Seen walking well with therapy in hallway Voices no complaints  ROS: +fatigue-improved, +right sided weakness-improving, negative for dizziness or pain. +heaviness in right side- worst in mornings and improves throughout the day, +insomnia- resolved  Objective:   No results found. No results for input(s): WBC, HGB, HCT, PLT in the last 72 hours.  Recent Labs    02/03/22 1051  NA 138  K 3.9  CL 107  CO2 26  GLUCOSE 127*  BUN 22  CREATININE 1.47*  CALCIUM 8.8*     Intake/Output Summary (Last 24 hours) at 02/04/2022 2019 Last data filed at 02/04/2022 1931 Gross per 24 hour  Intake 594 ml  Output 2700 ml  Net -2106 ml        Physical Exam: Vital Signs Blood pressure (!) 142/69, pulse (!) 56, temperature 98.2 F (36.8 C), resp. rate 17, height 5\' 9"  (1.753 m), weight 76.4 kg, SpO2 99 %. Physical Exam Gen: no distress, normal appearing, BMI 24.16, fatigued (generalized) HEENT: oral mucosa pink and moist, NCAT Cardio: Bradycardia Chest: normal effort, normal rate of breathing Abd: soft, non-distended Ext: no edema, TED garments in place Psych: pleasant, normal affect and behavior  Skin: intact Neurological/MSK    Comments: Patient is alert and oriented x3. Able to converse well and can follow basic commands. Decreased safety awareness Motor strength: 2/5 RUE with the exception of 3/5 EF and hand grip. 3/5 RLE except for 0/5 EHL and DF. Sensation is intact. Slow to move fingers of right hand. AFO in place RLE. Decreased insight into deficits Ambulating CG to S, using 1 handrail on left side for support on stairs +perseveration on weakness   Assessment/Plan: 1. Functional deficits which require 3+ hours per day of interdisciplinary therapy in a comprehensive inpatient rehab setting. Physiatrist is providing close team supervision and 24 hour management of active medical  problems listed below. Physiatrist and rehab team continue to assess barriers to discharge/monitor patient progress toward functional and medical goals  Care Tool:  Bathing    Body parts bathed by patient: Right arm, Face, Chest, Abdomen, Front perineal area, Buttocks, Right upper leg, Left upper leg, Right lower leg, Left lower leg   Body parts bathed by helper: Left arm     Bathing assist Assist Level: Minimal Assistance - Patient > 75%     Upper Body Dressing/Undressing Upper body dressing   What is the patient wearing?: Pull over shirt    Upper body assist Assist Level: Minimal Assistance - Patient > 75%    Lower Body Dressing/Undressing Lower body dressing      What is the patient wearing?: Pants     Lower body assist Assist for lower body dressing: Minimal Assistance - Patient > 75%     Toileting Toileting Toileting Activity did not occur (Clothing management and hygiene only): N/A (no void or bm)  Toileting assist Assist for toileting: Independent with assistive device Assistive Device Comment: Urinal   Transfers Chair/bed transfer  Transfers assist     Chair/bed transfer assist level: Contact Guard/Touching assist     Locomotion Ambulation   Ambulation assist      Assist level: Contact Guard/Touching  assist Assistive device: Cane-straight Max distance: 150   Walk 10 feet activity   Assist     Assist level: Contact Guard/Touching assist Assistive device: Cane-quad, Cane-straight   Walk 50 feet activity   Assist    Assist level: Contact Guard/Touching assist Assistive device: Cane-straight, Cane-quad    Walk 150 feet activity   Assist    Assist level: Contact Guard/Touching assist Assistive device: Cane-straight, Cane-quad    Walk 10 feet on uneven surface  activity   Assist Walk 10 feet on uneven surfaces activity did not occur: Safety/medical concerns         Wheelchair     Assist Is the patient using a  wheelchair?: Yes Type of Wheelchair: Manual Wheelchair activity did not occur: N/A  Wheelchair assist level: Supervision/Verbal cueing Max wheelchair distance: 25    Wheelchair 50 feet with 2 turns activity    Assist    Wheelchair 50 feet with 2 turns activity did not occur: N/A       Wheelchair 150 feet activity     Assist  Wheelchair 150 feet activity did not occur: N/A       Blood pressure (!) 142/69, pulse (!) 56, temperature 98.2 F (36.8 C), resp. rate 17, height 5\' 9"  (1.753 m), weight 76.4 kg, SpO2 99 %.    Medical Problem List and Plan: 1. 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  Impaired mobility, ambulating 150 feet d/c Lovenox. Continue Aspirin 81 mg daily and Plavix 75 mg daily with initial plan x3 weeks then aspirin alone 3. Pain Management: Tylenol as needed 4. Insomnia: resolved, increase magnesium gluconate to 500mg  HS. Discussed with patient.  5. Neuropsych: This patient is capable of making decisions on his own behalf. 6. Skin/Wound Care: Routine skin checks 7. Fluids/Electrolytes/Nutrition: Routine in and outs with follow-up chemistries 8.  Chronic diastolic congestive heart failure.  Follows with Dr. Johnsie Cancel.  Continue Entresto 24-26 mg twice daily.  Monitor for any signs of fluid overload 9.  Hypertension.Change Bidil to Imdur 120mg  daily starting tomorrow, Decrease Toprol XL to 50mg .  Increase Hydralazine to 100mg  TID starting tomorrow Monitor with increased mobility. Provided list of foods to help with blood pressure. Discontinued order for TEDs. Increase magnesium gluconate to 500mg  HS. Monitor for effects of these changes.  10.  Diabetes mellitus.  Hemoglobin A1c 7.1.  Increase Farxiga to 10 mg daily.   Diabetic teaching. Recommended avoiding added sugar. Provided education to avoid foods with added sugar. Reviewed CBGs [id number] and discussed with patient. Continue carb modified diet.  Much improved CBGs, d/c ISS.   CBG (last 3)  Recent Labs    02/04/22 0603 02/04/22 1149 02/04/22 1659  GLUCAP 111* 96 118*   11.  Hyperlipidemia. continue Lipitor 12.  CKD stage III.  Follow-up chemistries. Cr 1.35 on most recent check.  13.  Medical noncompliance.  Counseling 14. Bradycardia: resolved, continue to monitor HR TID. Will maintain Toprol given HTN.      02/04/2022    7:32 PM 02/04/2022    1:57 PM 02/04/2022    5:16 AM  Vitals with BMI  Systolic [id number] Q000111Q  Q000111Q  Diastolic 69 69 73  Pulse 56 61 70    15. History of being overweight: BMI improved this hospitalization from 26.45 to 24.16. provide dietary education. Provided list of foods to assist in weight loss.  16. RLE foot drop: AFO ordered and applied. Getting newer better fitting AFO for her.  17. Vitamin D deficiency: continue ergocalciferol 50,000U once per week for 7 weeks. Discussed with patient and he is agreeable 18. Poor safety awareness: continue to provide education 19. Fatigue: discussed that Vitamin D supplement can help this. B12 is also at low end of normal, 1,000mg  injected today (5/12). Iron level checked and normal. Discussed with patient.  20. History of TBI with residual cognitive deficits: continue SLP 21. Impulsivity: continue to reinforce importance of safety precautions to avoid falls. Continue bed alarm and safety measures. 22. Insomnia: continue melatonin 3mg  HS. Grounds pass ordered for sunlight exposure during the day 23. Suboptimal potassium: continue 18meq klor daily 24. Bradycardia: decrease toprol XL to 50mg    LOS: 13 days A FACE TO FACE EVALUATION WAS PERFORMED  Clide Deutscher Royanne Warshaw 02/04/2022, 8:19 PM

## 2022-02-05 DIAGNOSIS — I10 Essential (primary) hypertension: Secondary | ICD-10-CM

## 2022-02-05 DIAGNOSIS — N183 Chronic kidney disease, stage 3 unspecified: Secondary | ICD-10-CM

## 2022-02-05 LAB — BASIC METABOLIC PANEL
Anion gap: 5 (ref 5–15)
BUN: 12 mg/dL (ref 8–23)
CO2: 27 mmol/L (ref 22–32)
Calcium: 9 mg/dL (ref 8.9–10.3)
Chloride: 109 mmol/L (ref 98–111)
Creatinine, Ser: 1.48 mg/dL — ABNORMAL HIGH (ref 0.61–1.24)
GFR, Estimated: 49 mL/min — ABNORMAL LOW (ref >60)
Glucose, Bld: 105 mg/dL — ABNORMAL HIGH (ref 70–99)
Potassium: 4.1 mmol/L (ref 3.5–5.1)
Sodium: 141 mmol/L (ref 135–145)

## 2022-02-05 LAB — GLUCOSE, CAPILLARY
Glucose-Capillary: 103 mg/dL — ABNORMAL HIGH (ref 70–99)
Glucose-Capillary: 106 mg/dL — ABNORMAL HIGH (ref 70–99)
Glucose-Capillary: 132 mg/dL — ABNORMAL HIGH (ref 70–99)
Glucose-Capillary: 114 mg/dL — ABNORMAL HIGH (ref 70–99)

## 2022-02-05 NOTE — Progress Notes (Signed)
Patient

## 2022-02-05 NOTE — Progress Notes (Signed)
Occupational Therapy Session Note  Patient Details  Name: Joseph Patrick MRN: [id number] Date of Birth: 1946-03-05  Today's Date: 02/05/2022 OT Individual Time: 1134-1200 OT Individual Time Calculation (min): 26 min    Short Term Goals: Week 2:  OT Short Term Goal 1 (Week 2): STGs=LTGs  Skilled Therapeutic Interventions/Progress Updates:  Pt greeted up in bathroom, pt agreeable to OT intervention. Pt ambulated to gym with SPC with CGA. Pt completed below therapeutic activities for RUE NMR and to increase RUE AROM/FMC.                - 1 min of Supination/pronation with pt holding 2 lb weighted ball - 1 min of chest presses with 2 lb weighted ball - reaching for bean bags with RUE with an emphasis on digit flexion/extension, motor planning, elbow flexion/extension  - stacking cones with RUE with an emphasis on digit flexion/extension, shoulder flexion, wrist flexion/extension -wash cloth grabs with RUE with an emphasis on digit flexion/extension Pt ambulated back to room with SPC with CGA, pt left up in recliner with NT present.  Therapy Documentation Precautions:  Precautions Precautions: Fall Precaution Comments: R hemiparesis and foot drop, decreased insight and questionable safety awareness Restrictions Weight Bearing Restrictions: No  Pain: unrated pain in RUE d/t fatigue, rest breaks provided as needed.     Therapy/Group: Individual Therapy  Precious Haws 02/05/2022, 12:21 PM

## 2022-02-05 NOTE — Plan of Care (Signed)
  Problem: Consults Goal: RH STROKE PATIENT EDUCATION Description: See Patient Education module for education specifics  Outcome: Progressing   Problem: RH SAFETY Goal: RH STG ADHERE TO SAFETY PRECAUTIONS W/ASSISTANCE/DEVICE Description: STG Adhere to Safety Precautions With cues Assistance/Device. Outcome: Not Progressing Note: Patient continues to turn off alarms and walk in room and to the bathroom by himself. Patient redirected by continues to do it. States that he does not need assistance or alarm.   Problem: RH KNOWLEDGE DEFICIT Goal: RH STG INCREASE KNOWLEDGE OF DIABETES Description: Patient and niece will be able to manage DM with medications and dietary modifications using handouts and educational resources independently Outcome: Progressing Goal: RH STG INCREASE KNOWLEDGE OF HYPERTENSION Description: Patient and niece will be able to manage HTN with medications and dietary modifications using handouts and educational resources independently Outcome: Progressing Goal: RH STG INCREASE KNOWLEGDE OF HYPERLIPIDEMIA Description: Patient and niece will be able to manage HLD with medications and dietary modifications using handouts and educational resources independently Outcome: Progressing   Problem: Education: Goal: Ability to demonstrate management of disease process will improve Outcome: Progressing Goal: Ability to verbalize understanding of medication therapies will improve Outcome: Progressing Goal: Individualized Educational Video(s) Outcome: Progressing

## 2022-02-05 NOTE — Progress Notes (Signed)
Physical Therapy Session Note  Patient Details  Name: Joseph Patrick MRN: [id number] Date of Birth: 02/02/1946  Today's Date: 02/05/2022 PT Individual Time: 0900-1013 + 1415-1525  PT Individual Time Calculation (min): 73 min + 70 min  Short Term Goals: Week 2:  PT Short Term Goal 1 (Week 2): STG = LTG due to ELOS  Skilled Therapeutic Interventions/Progress Updates:     1st session: Pt seen sitting in recliner with family at bedside who left on arrival. Pt agreeable to PT tx and denies pain. Pt with R AFO and tennis shoes already on. Pt reporting he was able to don shoes and AFO without assist earlier this AM. Sit<>stand to QC with supervision. Ambulated to main rehab gym, ~148f, with supervision and QC. Pt requesting to work on [employer] Completed rebounder toss in forwards and sideways directions with CGA for safety, incorporating RUE to throw and catch volleyball. Also worked with punching bag sets - 4x10 forward punches, side punches, hammer punches, and modified planti-grade push ups. Pt asking about SPC vs QC - retrieved from DME closet. Gait ~2068fwith supervision and SPC, x1 LOB to the L that was self corrected. No significant difference in gait quality of QC vs SPC and pt preferring SPC. Will use this from now on to train prior to DC. Continued SPC gait training with cones and emphasis on single leg stance for stance control and strengthening - CGA for safety. Ambulated back to his room, ~15057fwith supervision and SPC. Concluded session seated in recliner, all needs in reach, call bell in lap.   2nd session: Pt sitting in recliner with NT getting routine vitals to start session - vitals WNL. Pt agreeable to PT tx and denies pain. R AFO and tennis shoes already on. Sit<>stand using SPC with supervision. Ambulated from his room to elevator, ~100f66fth supervision and SPC. Standing rest break in elevator and then ambulated an additional >300ft108fh supervision and SPC outdoors  next to [facility] bench where we discussed DC planning, stroke recovery, etc. Practiced outdoor gait on unlevel concrete with CGA and SPC - cues for safety awareness and paced activity while navigating unfamiliar environments. While outdoors, ambulated variable distances >150ft,60fgressing from CGA to close supervision.   Returned to [employer] and setup on Nustep. Completed 10 minutes with workload set to 4. Ace wrapped RUE to include in activity. Pt requesting to use toilet due to urge to void. Ambulated to bathroom across ortho rehab gym, ~50ft w48fsupervision and SPC. Pt continent of bladder while standing. Charted in flowsheets. Returned to ortho rehab gym to complete BERG balance test.  Next, completed the BERG balance test with results outlined below. Patient demonstrates increased fall risk as noted by score of   46/56 on Berg Balance Scale.  (<36= high risk for falls, close to 100%; 37-45 significant >80%; 46-51 moderate >50%; 52-55 lower >25%).  Ambulated back to his room, ~125ft, w53fsupervision and SPC. He had a few instances of toe catching on R during swing but LOB were self corrected. Suspect these were due to fatigue from day of therapies. Concluded session seated in recliner with call bell in lap, all needs met.   Therapy Documentation Precautions:  Precautions Precautions: Fall Precaution Comments: R hemiparesis and foot drop, decreased insight and questionable safety awareness Restrictions Weight Bearing Restrictions: No General:    Balance: Balance Balance Assessed: Yes Standardized Balance Assessment Standardized Balance Assessment: Berg Balance Test Berg Balance Test Sit to  Stand: Able to stand without using hands and stabilize independently Standing Unsupported: Able to stand safely 2 minutes Sitting with Back Unsupported but Feet Supported on Floor or Stool: Able to sit safely and securely 2 minutes Stand to Sit: Sits safely with minimal use of  hands Transfers: Able to transfer safely, minor use of hands Standing Unsupported with Eyes Closed: Able to stand 10 seconds safely Standing Ubsupported with Feet Together: Able to place feet together independently and stand 1 minute safely From Standing, Reach Forward with Outstretched Arm: Can reach forward >12 cm safely (5") From Standing Position, Pick up Object from Floor: Able to pick up shoe safely and easily From Standing Position, Turn to Look Behind Over each Shoulder: Looks behind from both sides and weight shifts well Turn 360 Degrees: Able to turn 360 degrees safely but slowly Standing Unsupported, Alternately Place Feet on Step/Stool: Able to complete >2 steps/needs minimal assist Standing Unsupported, One Foot in Front: Needs help to step but can hold 15 seconds Standing on One Leg: Able to lift leg independently and hold 5-10 seconds Total Score: 46/56  Therapy/Group: Individual Therapy  Alger Simons 02/05/2022, 7:27 AM

## 2022-02-05 NOTE — Progress Notes (Signed)
Physical Therapy Weekly Progress Note  Patient Details  Name: Joseph Patrick MRN: [id number] Date of Birth: July 20, 1946  Beginning of progress report period: Jan 31, 2022 End of progress report period: Feb 05, 2022  Joseph Patrick is making steady progress towards LTG of supervision to mod I. Overall, he completes bed mobility mod I with hospital bed rails, sit<>stand transfers with supervision and QC, ambulates >154ft with close supervision and QC, and can navigate up/down x12, 6inch steps with CGA and 1 hand rail. His AFO has been provided through [facility] and will be provided a QC piror to DC. He is primarily limited by R sided weakness (RUE > RLE) but this is steadily improving. Safety awareness and insight into deficits remain questionable but continued education is provided. He's on track to meet LTG.   Patient continues to demonstrate the following deficits muscle weakness, decreased cardiorespiratoy endurance, impaired timing and sequencing, and decreased standing balance, hemiplegia, and decreased balance strategies and therefore will continue to benefit from skilled PT intervention to increase functional independence with mobility.  Patient progressing toward long term goals..  Continue plan of care.  PT Short Term Goals Week 3:  PT Short Term Goal 1 (Week 3): STG = LTG  Therapy Documentation Precautions:  Precautions Precautions: Fall Precaution Comments: R hemiparesis and foot drop, decreased insight and questionable safety awareness Restrictions Weight Bearing Restrictions: No General:    Therapy/Group: Individual Therapy  Joseph Patrick 02/05/2022, 7:45 AM

## 2022-02-05 NOTE — Progress Notes (Signed)
PROGRESS NOTE   Subjective/Complaints: No new complaints or concerns this AM.   ROS: No CP or SOB, +fatigue-improved, +right sided weakness-improving, negative for dizziness or pain.  +insomnia- resolved  Objective:   No results found. No results for input(s): WBC, HGB, HCT, PLT in the last 72 hours.  Recent Labs    02/03/22 1051 02/05/22 0638  NA 138 141  K 3.9 4.1  CL 107 109  CO2 26 27  GLUCOSE 127* 105*  BUN 22 12  CREATININE 1.47* 1.48*  CALCIUM 8.8* 9.0      Intake/Output Summary (Last 24 hours) at 02/05/2022 0829 Last data filed at 02/05/2022 0723 Gross per 24 hour  Intake 594 ml  Output 2350 ml  Net -1756 ml         Physical Exam: Vital Signs Blood pressure (!) 146/70, pulse (!) 56, temperature 98.3 F (36.8 C), resp. rate 16, height 5\' 9"  (1.753 m), weight 76.4 kg, SpO2 98 %. Physical Exam Gen: no distress, normal appearing, I n bed HEENT: oral mucosa pink and moist, NCAT Cardio: Bradycardia Chest: normal effort, normal rate of breathing Abd: soft, non-distended, not tender Ext: no edema, TED garments in place Psych: pleasant, normal affect and behavior  Skin: intact Neurological/MSK    Comments: Patient is alert and oriented x3. Able to converse well and can follow basic commands. Decreased safety awareness Motor strength: 2/5 RUE with the exception of 3/5 EF and hand grip. 3/5 RLE except for 0/5 EHL and DF. Sensation is intact. Slow to move fingers of right hand. AFO in place RLE. Decreased insight into deficits Ambulating CG to S, using 1 handrail on left side for support on stairs +perseveration on weakness   Assessment/Plan: 1. Functional deficits which require 3+ hours per day of interdisciplinary therapy in a comprehensive inpatient rehab setting. Physiatrist is providing close team supervision and 24 hour management of active medical problems listed below. Physiatrist and rehab  team continue to assess barriers to discharge/monitor patient progress toward functional and medical goals  Care Tool:  Bathing    Body parts bathed by patient: Right arm, Face, Chest, Abdomen, Front perineal area, Buttocks, Right upper leg, Left upper leg, Right lower leg, Left lower leg   Body parts bathed by helper: Left arm     Bathing assist Assist Level: Minimal Assistance - Patient > 75%     Upper Body Dressing/Undressing Upper body dressing   What is the patient wearing?: Pull over shirt    Upper body assist Assist Level: Minimal Assistance - Patient > 75%    Lower Body Dressing/Undressing Lower body dressing      What is the patient wearing?: Pants     Lower body assist Assist for lower body dressing: Minimal Assistance - Patient > 75%     Toileting Toileting Toileting Activity did not occur (Clothing management and hygiene only): N/A (no void or bm)  Toileting assist Assist for toileting: Independent with assistive device Assistive Device Comment: Urinal   Transfers Chair/bed transfer  Transfers assist     Chair/bed transfer assist level: Contact Guard/Touching assist     Locomotion Ambulation   Ambulation assist  Assist level: Contact Guard/Touching assist Assistive device: Cane-straight Max distance: 150   Walk 10 feet activity   Assist     Assist level: Contact Guard/Touching assist Assistive device: Cane-quad, Cane-straight   Walk 50 feet activity   Assist    Assist level: Contact Guard/Touching assist Assistive device: Cane-straight, Cane-quad    Walk 150 feet activity   Assist    Assist level: Contact Guard/Touching assist Assistive device: Cane-straight, Cane-quad    Walk 10 feet on uneven surface  activity   Assist Walk 10 feet on uneven surfaces activity did not occur: Safety/medical concerns         Wheelchair     Assist Is the patient using a wheelchair?: Yes Type of Wheelchair:  Manual Wheelchair activity did not occur: N/A  Wheelchair assist level: Supervision/Verbal cueing Max wheelchair distance: 25    Wheelchair 50 feet with 2 turns activity    Assist    Wheelchair 50 feet with 2 turns activity did not occur: N/A       Wheelchair 150 feet activity     Assist  Wheelchair 150 feet activity did not occur: N/A       Blood pressure (!) 146/70, pulse (!) 56, temperature 98.3 F (36.8 C), resp. rate 16, height 5\' 9"  (1.753 m), weight 76.4 kg, SpO2 98 %.    Medical Problem List and Plan: 1. 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  Impaired mobility, ambulating 150 feet d/c Lovenox. Continue Aspirin 81 mg daily and Plavix 75 mg daily with initial plan x3 weeks then aspirin alone 3. Pain Management: Tylenol as needed 4. Insomnia: resolved, increase magnesium gluconate to 500mg  HS. Discussed with patient.  5. Neuropsych: This patient is capable of making decisions on his own behalf. 6. Skin/Wound Care: Routine skin checks 7. Fluids/Electrolytes/Nutrition: Routine in and outs with follow-up chemistries 8.  Chronic diastolic congestive heart failure.  Follows with Dr. Johnsie Cancel.  Continue Entresto 24-26 mg twice daily.  Monitor for any signs of fluid overload 9.  Hypertension.Change Bidil to Imdur 120mg  daily starting tomorrow, Decrease Toprol XL to 50mg .  Increase Hydralazine to 100mg  TID starting tomorrow Monitor with increased mobility. Provided list of foods to help with blood pressure. Discontinued order for TEDs. Increase magnesium gluconate to 500mg  HS. Monitor for effects of these changes.   -5/22 Fair BP control last 2 days, Continue to monitor for now and  consider additional medications tomorrow if needed 10.  Diabetes mellitus.  Hemoglobin A1c 7.1.  Increase Farxiga to 10 mg daily.  Diabetic teaching. Recommended avoiding added sugar. Provided education to avoid foods with added sugar. Reviewed CBGs [id number] and discussed with patient. Continue carb modified diet.  Much improved CBGs, d/c ISS.   CBG (last 3)  Recent Labs    02/04/22 1659 02/04/22 2035 02/05/22 0608  GLUCAP 118* 153* 103*   CBGs well controlled, continue current treatment 11.  Hyperlipidemia. continue Lipitor 12.  CKD stage III.  Follow-up chemistries. Cr 1.35 on most recent check.   -5/22 CR 1.48 today overall stable, continue to monitor 13.  Medical noncompliance.  Counseling 14. Bradycardia: resolved, continue to monitor HR TID. Will maintain Toprol given HTN.      02/05/2022    5:12 AM 02/04/2022    7:32 PM 02/04/2022    1:57 PM  Vitals with BMI  Systolic [id number]  Diastolic 70 69 69  Pulse 56 56 61    15. History of being overweight: BMI improved this hospitalization from 26.45 to 24.16. provide dietary education. Provided list of foods to assist in weight loss.  16. RLE foot drop: AFO ordered and applied. Getting newer better fitting AFO for her.  17. Vitamin D deficiency: continue ergocalciferol 50,000U once per week for 7 weeks. Discussed with patient and he is agreeable 18. Poor safety awareness: continue to provide education 19. Fatigue: discussed that Vitamin D supplement can help this. B12 is also at low end of normal, 1,000mg  injected today (5/12). Iron level checked and normal. Discussed with patient.  20. History of TBI with residual cognitive deficits: continue SLP 21. Impulsivity: continue to reinforce importance of safety precautions to avoid falls. Continue bed alarm and safety measures. 22. Insomnia: continue melatonin 3mg  HS. Grounds pass ordered for sunlight exposure during the day 23. Suboptimal potassium: continue 88meq klor daily 24.  Bradycardia: decrease toprol XL to 50mg    LOS: 14 days A FACE TO FACE EVALUATION WAS PERFORMED  Jennye Boroughs 02/05/2022, 8:29 AM

## 2022-02-05 NOTE — Progress Notes (Signed)
Physical Therapy Session Note  Patient Details  Name: Joseph Patrick MRN: [id number] Date of Birth: 04/02/1946  Today's Date: 02/05/2022 PT Missed Time: 30 Minutes Missed Time Reason: Patient unwilling to participate  Pt received in recliner. Pt reports he does not want to participate as he is planning on his session at 2. "You can tell them I'm not doing nothin' til 2." Pt declined any mobility or toileting and remained seated in recliner. Missed 30 minutes scheduled therapy.   Therapy/Group: Individual Therapy  Juluis Rainier 02/05/2022, 2:00 PM

## 2022-02-06 LAB — GLUCOSE, CAPILLARY
Glucose-Capillary: 159 mg/dL — ABNORMAL HIGH (ref 70–99)
Glucose-Capillary: 135 mg/dL — ABNORMAL HIGH (ref 70–99)
Glucose-Capillary: 145 mg/dL — ABNORMAL HIGH (ref 70–99)
Glucose-Capillary: 129 mg/dL — ABNORMAL HIGH (ref 70–99)

## 2022-02-06 NOTE — Progress Notes (Signed)
Occupational Therapy Session Note  Patient Details  Name: Joseph Patrick MRN: [id number] Date of Birth: May 20, 1946  Today's Date: 02/06/2022 OT Individual Time: [id number] OT Individual Time Calculation (min): 55 min    Short Term Goals: Week 2:  OT Short Term Goal 1 (Week 2): STGs=LTGs  Skilled Therapeutic Interventions/Progress Updates:    Pt received sitting up with no c/o pain, agreeable to OT session. He completed 150 ft of functional mobility to the therapy gym with a SPC with CGA. Min cueing for hip flexion d/t slight catching of R foot. He transferred to supine on the mat with (S). From supine worked on [employer]. Focus on both bimanual integration and isolated RUE shoulder flexion. Chest press with a 3lb dowel with cueing for increased visual attention to the RUE-3x 10 repetitions. Pt performed without any facilitation to address proprioception. Increase time under tension for elbow flex/ext with a 4lb dumbbell. He required min facilitation for wrist extension and more stable gross grasp. He completed resistive elbow extension with overhead manual resistance to challenge triceps and serratus anterior. He required min facilitation laterally at the elbow to reduce abduction and trunk compensation. He transitioned into sidelying to encourage visual attention to the RUE and completed unweighted, slowed down elbow flex/ext to work on IT trainer. He returned to his room and was left sitting up with the chair alarm belt on.    Therapy Documentation Precautions:  Precautions Precautions: Fall Precaution Comments: R hemiparesis and foot drop, decreased insight and questionable safety awareness Restrictions Weight Bearing Restrictions: (P) No   Therapy/Group: Individual Therapy  [employer] 02/06/2022, 6:18 AM

## 2022-02-06 NOTE — Progress Notes (Signed)
Physical Therapy Session Note  Patient Details  Name: Joseph Patrick MRN: [id number] Date of Birth: Dec 09, 1945  Today's Date: 02/06/2022 PT Individual Time: 1330-1425 PT Individual Time Calculation (min): 55 min   Short Term Goals: Week 3:  PT Short Term Goal 1 (Week 3): STG = LTG  Skilled Therapeutic Interventions/Progress Updates:     Pt seen sitting in recliner - R AFO and tennis shoes already on. Pt agreeable to PT tx and denies pain, only reports fatigue from earlier therapy sessions.   Sit<>stand using SPC with supervision, relying on back of legs to stand. Ambulated to main rehab gym with supervision and SPC, ~180f. Intermittent toe catching on R with self corrected LOB x2 - occurs primarily with distractions or head turns during gait.   NMR completed for R ankle DF strengthening using [employer] select NMES. 10 minutes of total time with frequency at 35, 1:2 on/off time, intensity set to 80. Difficulty locating adequate ankle eversion during NMR. Pt requesting to trial NMES for R wrist extensors - applied to brachioradialis and other small muscle groups, RUE placed in antigravity position. Frequency to 35 with 1:2 on/off time, intensity set to 50. Good response in wrist extensors, better than ankle DF.   Ambulated back to his room, ~1581f with supervision and SPC - less toe drag with cues for safety awareness and task attenuation. Concluded session seated in recliner, call bell in reach, all needs met.   Therapy Documentation Precautions:  Precautions Precautions: Fall Precaution Comments: R hemiparesis and foot drop, decreased insight and questionable safety awareness Restrictions Weight Bearing Restrictions: (P) No General:    Therapy/Group: Individual Therapy  ChAlger Simons/23/2023, 7:19 AM

## 2022-02-06 NOTE — Progress Notes (Signed)
Patient ID: Joseph Patrick, male   DOB: January 04, 1946, 76 y.o.   MRN: [id number]  met with pt to answer his questions. Have cancelled the quad cane and 3 in 1 and ordered a straight cane. He has progressed since DME order placed. Discussed home health and he has no preference will make referral to [facility] agency who takes [facility]. He is happy to be discharging Thursday due to progress made in therapies.

## 2022-02-06 NOTE — Progress Notes (Signed)
PROGRESS NOTE   Subjective/Complaints: Doing very well with therapy, discussed moving d/c date up to Thursday and he is agreeable, he called niece and confirmed this would be ok with her   ROS: Denies CP or SOB, +fatigue-improved, +right sided weakness-improving, negative for dizziness or pain.  +insomnia- resolved  Objective:   No results found. No results for input(s): WBC, HGB, HCT, PLT in the last 72 hours.  Recent Labs    02/03/22 1051 02/05/22 0638  NA 138 141  K 3.9 4.1  CL 107 109  CO2 26 27  GLUCOSE 127* 105*  BUN 22 12  CREATININE 1.47* 1.48*  CALCIUM 8.8* 9.0     Intake/Output Summary (Last 24 hours) at 02/06/2022 1045 Last data filed at 02/06/2022 1020 Gross per 24 hour  Intake 720 ml  Output 2325 ml  Net -1605 ml        Physical Exam: Vital Signs Blood pressure (!) (P) 160/70, pulse 62, temperature 98 F (36.7 C), temperature source Oral, resp. rate 18, height 5\' 9"  (1.753 m), weight 76.4 kg, SpO2 98 %. Physical Exam Gen: no distress, normal appearing, I n bed, BMI 24.87 HEENT: oral mucosa pink and moist, NCAT Cardio: Bradycardia Chest: normal effort, normal rate of breathing Abd: soft, non-distended, not tender Ext: no edema, TED garments in place Psych: pleasant, normal affect and behavior  Skin: intact Neurological/MSK    Comments: Patient is alert and oriented x3. Able to converse well and can follow basic commands. Decreased safety awareness Motor strength: 3/5 RUE, 3/5 RLE except for 0/5 EHL and DF. Sensation is intact. Slow to move fingers of right hand. AFO in place RLE. Decreased insight into deficits Ambulating CG to S, using 1 handrail on left side for support on stairs +perseveration on weakness   Assessment/Plan: 1. Functional deficits which require 3+ hours per day of interdisciplinary therapy in a comprehensive inpatient rehab setting. Physiatrist is providing close team  supervision and 24 hour management of active medical problems listed below. Physiatrist and rehab team continue to assess barriers to discharge/monitor patient progress toward functional and medical goals  Care Tool:  Bathing    Body parts bathed by patient: Right arm, Face, Chest, Abdomen, Front perineal area, Buttocks, Right upper leg, Left upper leg, Right lower leg, Left lower leg   Body parts bathed by helper: Left arm     Bathing assist Assist Level: Minimal Assistance - Patient > 75%     Upper Body Dressing/Undressing Upper body dressing   What is the patient wearing?: Pull over shirt    Upper body assist Assist Level: Minimal Assistance - Patient > 75%    Lower Body Dressing/Undressing Lower body dressing      What is the patient wearing?: Pants     Lower body assist Assist for lower body dressing: Minimal Assistance - Patient > 75%     Toileting Toileting Toileting Activity did not occur (Clothing management and hygiene only): N/A (no void or bm)  Toileting assist Assist for toileting: Independent with assistive device Assistive Device Comment: Urinal   Transfers Chair/bed transfer  Transfers assist     Chair/bed transfer assist level: Contact Guard/Touching assist  Locomotion Ambulation   Ambulation assist      Assist level: Contact Guard/Touching assist Assistive device: Cane-straight Max distance: 150   Walk 10 feet activity   Assist     Assist level: Contact Guard/Touching assist Assistive device: Cane-quad, Cane-straight   Walk 50 feet activity   Assist    Assist level: Contact Guard/Touching assist Assistive device: Cane-straight, Cane-quad    Walk 150 feet activity   Assist    Assist level: Contact Guard/Touching assist Assistive device: Cane-straight, Cane-quad    Walk 10 feet on uneven surface  activity   Assist Walk 10 feet on uneven surfaces activity did not occur: Safety/medical concerns          Wheelchair     Assist Is the patient using a wheelchair?: Yes Type of Wheelchair: Manual Wheelchair activity did not occur: N/A  Wheelchair assist level: Supervision/Verbal cueing Max wheelchair distance: 25    Wheelchair 50 feet with 2 turns activity    Assist    Wheelchair 50 feet with 2 turns activity did not occur: N/A       Wheelchair 150 feet activity     Assist  Wheelchair 150 feet activity did not occur: N/A       Blood pressure (!) (P) 160/70, pulse 62, temperature 98 F (36.7 C), temperature source Oral, resp. rate 18, height 5\' 9"  (1.753 m), weight 76.4 kg, SpO2 98 %.    Medical Problem List and Plan: 1. 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  2.  Impaired mobility, ambulating 150 feet d/c Lovenox. Continue Aspirin 81 mg daily and Plavix 75 mg daily with initial plan x3 weeks then aspirin alone 3. Pain Management: Tylenol as needed 4. Insomnia: resolved, increase magnesium gluconate to 500mg  HS. Discussed with patient.  5. Neuropsych: This patient is capable of making decisions on his own behalf. 6. Skin/Wound Care: Routine skin checks 7. Fluids/Electrolytes/Nutrition: Routine in and outs with follow-up chemistries 8.  Chronic diastolic congestive heart failure.  Follows with Dr. Johnsie Cancel.  Continue Entresto 24-26 mg twice daily.  Monitor for any signs of fluid overload 9.  Hypertension.Change Bidil to Imdur 120mg  daily starting tomorrow, Decrease Toprol XL to 50mg .  Continue Hydralazine to 100mg  TID starting tomorrow Monitor with increased mobility. Provided list of foods to  help with blood pressure. Discontinued order for TEDs. Increase magnesium gluconate to 500mg  HS. Monitor for effects of these changes.   -5/22 Fair BP control last 2 days, Continue to monitor for now and consider additional medications tomorrow if needed 10.  Diabetes mellitus.  Hemoglobin A1c 7.1.  Increase Farxiga to 10 mg daily.  Diabetic teaching. Recommended avoiding added sugar. Provided education to avoid foods with added sugar. Reviewed CBGs [id number] and discussed with patient. Continue carb modified diet.  Much improved CBGs, d/c ISS.   CBG (last 3)  Recent Labs    02/05/22 1640 02/05/22 2043 02/06/22 0714  GLUCAP 132* 114* 159*  CBGs well controlled, continue current treatment 11.  Hyperlipidemia.  continue Lipitor 12.  CKD stage III.  Follow-up chemistries. Cr 1.35 on most recent check.   -5/22 CR 1.48 today overall stable, continue to monitor 13.  Medical noncompliance.  Counseling 14. Bradycardia: resolved, continue to monitor HR TID. Will maintain Toprol given HTN.      02/06/2022    8:31 AM 02/06/2022    6:35 AM 02/06/2022    5:32 AM  Vitals with BMI  Systolic  0000000 [id number]  Diastolic  70 75  Pulse 62 59 59    15. History of being overweight: BMI improved this hospitalization from 26.45 to 24.16. provide dietary education. Provided list of foods to assist in weight loss.  16. RLE foot drop: AFO ordered and applied. Getting newer better fitting AFO for her.  17. Vitamin D deficiency: continue ergocalciferol 50,000U once per week for 7 weeks. Discussed with patient and he is agreeable 18. Poor safety awareness: continue to provide education 19. Fatigue: discussed that Vitamin D supplement can help this. B12 is also at low end of normal, 1,000mg  injected today (5/12). Iron level checked and normal. Discussed with patient.  20. History of TBI with residual cognitive deficits: continue SLP 21. Impulsivity: continue to reinforce importance of safety precautions to avoid falls.  Continue bed alarm and safety measures. 22. Insomnia: continue melatonin 3mg  HS. Grounds pass ordered for sunlight exposure during the day 23. Suboptimal potassium: resolved, d/c supplement 24. Bradycardia: decrease toprol XL to 50mg , discussed HR still low but improving.    LOS: 15 days A FACE TO FACE EVALUATION WAS PERFORMED  Martha Clan P Tiondra Fang 02/06/2022, 10:45 AM

## 2022-02-06 NOTE — Discharge Summary (Signed)
Physician Discharge Summary  Patient ID: Joseph Patrick MRN: [id number] DOB/AGE: 12/23/1945 76 y.o.  Admit date: 01/22/2022 Discharge date: 02/08/2022  Discharge Diagnoses:  Principal Problem:   Small vessel disease, cerebrovascular DVT prophylaxis Hypertension Chronic diastolic congestive heart failure Diabetes mellitus Medical noncompliance CKD stage III  Discharged Condition: Stable  Significant Diagnostic Studies: CT HEAD WO CONTRAST  Result Date: 01/14/2022 CLINICAL DATA:  Worsening neurological deficit, worsening of right-sided weakness EXAM: CT HEAD WITHOUT CONTRAST TECHNIQUE: Contiguous axial images were obtained from the base of the skull through the vertex without intravenous contrast. RADIATION DOSE REDUCTION: This exam was performed according to the departmental dose-optimization program which includes automated exposure control, adjustment of the mA and/or kV according to patient size and/or use of iterative reconstruction technique. COMPARISON:  01/06/2022 FINDINGS: Brain: There are no signs of bleeding within the cranium. There is interval appearance of low-density in the posterior limb of left internal capsule and left periventricular region. Ventricles are not dilated. There is no shift of midline structures. Cortical sulci are prominent. There is decreased density in the periventricular and subcortical white matter suggesting small-vessel disease. Vascular: Unremarkable. Skull: No fracture is seen. Sinuses/Orbits: Unremarkable. Other: None IMPRESSION: There is interval appearance 1.6 cm focus of decreased attenuation in the posterior limb of left internal capsule and left periventricular region suggesting evolving infarct in the left MCA distribution. Follow-up MRI may be considered. There are no signs of bleeding within the cranium. Atrophy.  Small-vessel disease. Electronically Signed   By: Elmer Picker M.D.   On: 01/14/2022 13:09   MR BRAIN WO CONTRAST  Result Date:  01/14/2022 CLINICAL DATA:  Neuro deficit, acute, stroke suspected; right leg weakness EXAM: MRI HEAD WITHOUT CONTRAST TECHNIQUE: Multiplanar, multiecho pulse sequences of the brain and surrounding structures were obtained without intravenous contrast. COMPARISON:  01/06/2022 FINDINGS: Brain: Restricted diffusion involving the left corona radiata, caudate body, and posterior lentiform nucleus. This corresponds to abnormality seen on CT. Foci of susceptibility are again identified in bilateral parietal subcortical white matter reflecting chronic blood products. Ventricles and sulci are stable in size and configuration. Patchy and confluent T2 hyperintensity in the supratentorial white matter probably reflects stable chronic microvascular ischemic changes. There is no intracranial mass, mass effect, hydrocephalus, or extra-axial collection. Vascular: Major vessel flow voids at the skull base are preserved. Skull and upper cervical spine: Normal marrow signal is preserved. Sinuses/Orbits: Paranasal sinuses are aerated. Orbits are unremarkable. Other: Sella is unremarkable.  Mastoid air cells are clear. IMPRESSION: Acute infarction involving the left corona radiata, caudate body, and posterior lentiform nucleus. Otherwise no change from recent prior study. Electronically Signed   By: Macy Mis M.D.   On: 01/14/2022 16:31    Labs:  Basic Metabolic Panel: Recent Labs  Lab 02/01/22 1354 02/03/22 1051 02/05/22 0638  NA 139 138 141  K 3.8 3.9 4.1  CL 106 107 109  CO2 27 26 27   GLUCOSE 144* 127* 105*  BUN 26* 22 12  CREATININE 1.48* 1.47* 1.48*  CALCIUM 8.9 8.8* 9.0  MG 2.3  --   --     CBC: No results for input(s): WBC, NEUTROABS, HGB, HCT, MCV, PLT in the last 168 hours.  CBG: Recent Labs  Lab 02/06/22 2111 02/07/22 0617 02/07/22 1200 02/07/22 1630 02/07/22 2113  GLUCAP 129* 107* 108* 147* 131*   Family history.  Positive for hypertension as well as hyperlipidemia.  Denies any colon  cancer esophageal cancer or rectal cancer  Brief HPI:   Joseph Patrick  is a 76 y.o. right-handed male with history of hypertension hyperlipidemia diabetes mellitus CKD stage III medical noncompliance, recent CVA left hemispheric subcortical with admission 01/06/2022 - 01/10/2022 and placed on low-dose aspirin and Plavix x3 weeks then aspirin alone complicated by hypertension initially on Norvasc and Entresto initiated after findings of chronic congestive heart failure with ejection fraction of 25 to 30%.  Per chart review lives alone 1 level apartment.  Independent prior to admission.  Presented 01/14/2022 with increasing right-sided weakness as well as stuttering speech.  CT/MRI showed acute infarction involving the left corona radiata, caudate body and posterior lentiform.  Recent echocardiogram with ejection fraction of 25 to 30% left ventricle demonstrated global hypokinesis grade 2 diastolic dysfunction.  Admission chemistries unremarkable except glucose 136 creatinine 1.43 urine drug screen negative.  Neurology follow-up currently maintained on low-dose aspirin and Plavix.  Lovenox for DVT prophylaxis.  Tolerating a regular diet.  Therapy evaluations completed due to patient's right side weakness decreased functional mobility was admitted for a comprehensive rehab program.   Hospital Course: Joseph Patrick was admitted to rehab 01/22/2022 for inpatient therapies to consist of PT, ST and OT at least three hours five days a week. Past admission physiatrist, therapy team and rehab RN have worked together to provide customized collaborative inpatient rehab.  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  Patient with progressive gains maintained on aspirin Plavix x3 weeks then aspirin alone.  Patient would follow-up neurology services.  Chronic diastolic congestive heart failure follow-up cardiology services  continued on Entresto as indicated no signs of fluid overload.  Blood pressure controlled on monitor on hydralazine, Imdur as well as Toprol.  Bouts of bradycardia Toprol adjusted.  Lipitor ongoing for hyperlipidemia.  History of CKD stage III chemistries monitored with latest creatinine 1.48.  Right lower extremity foot drop AFO has been ordered.  Blood sugars controlled monitored hemoglobin A1c 7.1 patient remained on Farxiga.  There was documented history of medical noncompliance patient did receive counseling in regards to maintaining medical regimen.   Blood pressures were monitored on TID basis and soft and monitored  Diabetes has been monitored with ac/hs CBG checks and SSI was use prn for tighter BS control.    Rehab course: During patient's stay in rehab weekly team conferences were held to monitor patient's progress, set goals and discuss barriers to discharge. At admission, patient required moderate assist 120 feet minimal guard sit to supine  Physical exam.  Blood pressure 118/70 pulse 70 respirations 18 oxygen saturations 92% room air temperature 98.6 Constitutional.  No acute distress HEENT Head.  Normocephalic and atraumatic Eyes.  Pupils round and reactive to light no discharge without nystagmus Neck.  Supple nontender no JVD without thyromegaly Cardiac regular rate rhythm any extra sounds or murmur heard Abdomen.  Soft nontender positive bowel sounds without rebound Respiratory effort normal no respiratory distress without wheeze Skin.  Intact Neurologic.  Patient alert oriented makes eye contact with examiner.  Provides name and age.  Follows simple commands.  2/5 right upper extremity with exception of 3/5 ADF.  3/5 right lower extremity except 0/5 in EHL and DF.  Sensation intact  He/She  has had improvement in activity tolerance, balance, postural control as well as ability to compensate for deficits. He/She has had improvement in functional use RUE/LUE  and RLE/LLE as well  as improvement in awareness.  Completes bed mobility modified independent with hospital bed rail sit  to stand transfers with supervision ambulates 150 feet close supervision with a straight point cane needing some encouragement at times to participate.  Navigate up and down stairs contact-guard assist with AFO brace in place.  He can gather his belongings for activities of daily living and homemaking.  It was stressed monitoring of safety.  Full teaching completed and plan discharged to home       Disposition: Discharge to home    Diet: Diabetic diet  Special Instructions: No driving smoking or alcohol  Medications at discharge. 1.  Tylenol as needed 2.  Aspirin 81 mg p.o. daily 3.  Lipitor 40 mg p.o. daily 4.  Farxiga 10 mg p.o. daily 5.  Hydralazine 100 mg p.o. 3 times daily 6.  Imdur 120 mg p.o. daily 7.  Magnesium gluconate 500 mg p.o. nightly 8.  Melatonin 3 mg p.o. nightly 9.  Toprol-XL 50 mg p.o. daily 10.  Entresto 49-51 mg twice daily 11.  Vitamin D 50,000 units every 7 days  30-35 minutes were spent completing discharge summary and discharge planning  Discharge Instructions     Ambulatory referral to Neurology   Complete by: As directed    An appointment is requested in approximately: 4 weeks left corona radiata infarction   Ambulatory referral to Physical Medicine Rehab   Complete by: As directed    Moderate complexity follow-up 1 to 2 weeks left corona radiata infarction        Follow-up Information     Raulkar, Clide Deutscher, MD Follow up.   Specialty: Physical Medicine and Rehabilitation Why: Office to call for appointment Contact information: [id number] [address] [place] [address] [phone]         Josue Hector, MD Follow up.   Specialty: Cardiology Why: call for appointment Contact information: [id number] [address] [place] [address] [phone]                 Signed: Cathlyn Parsons 02/08/2022, 5:28  AM

## 2022-02-06 NOTE — Progress Notes (Signed)
Occupational Therapy Session Note  Patient Details  Name: Joseph Patrick MRN: [id number] Date of Birth: September 19, 1945  Today's Date: 02/06/2022 OT Individual Time: 0950-1105 OT Individual Time Calculation (min): 75 min    Short Term Goals: Week 2:  OT Short Term Goal 1 (Week 2): STGs=LTGs  Skilled Therapeutic Interventions/Progress Updates:    Patient received seated in recliner, eager to go to therapy gym to "work on arm"  Patient walked to gym using single point cane and supervision.  Worked on walking without cane to encourage increased stance time on right leg, and increased weight shift to right.  Worked on carrying a ball with BUE to encourage more sustained use of RUE.  Patient has active movement at all joints but muscle activity is not balanced, and he has momentary versus prolonged activation.   Modified plantigrade to address increased demand on RUE, weight shift to Right side, and shoulder stretch.  Patient better able to sustain active triceps in this position.   Functional use of RUE for familiar functional activities, folding a towel, placing pillowcase on pillow, and holding pen and pre-writing skills.  Patient with tendency to start motion from shoulder - with cueing able to start from lower arm.    Therapy Documentation Precautions:  Precautions Precautions: Fall Precaution Comments: R hemiparesis and foot drop, decreased insight and questionable safety awareness Restrictions Weight Bearing Restrictions: (P) No Pain: Pain Assessment Pain Scale: 0-10 Pain Score: 0-No pain     Therapy/Group: Individual Therapy  Collier Salina 02/06/2022, 12:17 PM

## 2022-02-07 ENCOUNTER — Other Ambulatory Visit: Payer: Self-pay | Admitting: Physical Medicine and Rehabilitation

## 2022-02-07 LAB — GLUCOSE, CAPILLARY
Glucose-Capillary: 107 mg/dL — ABNORMAL HIGH (ref 70–99)
Glucose-Capillary: 108 mg/dL — ABNORMAL HIGH (ref 70–99)
Glucose-Capillary: 147 mg/dL — ABNORMAL HIGH (ref 70–99)
Glucose-Capillary: 131 mg/dL — ABNORMAL HIGH (ref 70–99)

## 2022-02-07 MED ORDER — MAGNESIUM GLUCONATE 500 MG PO TABS
500.0000 mg | ORAL_TABLET | Freq: Every day | ORAL | 0 refills | Status: DC
Start: 2022-02-07 — End: 2022-02-20

## 2022-02-07 MED ORDER — FREESTYLE LIBRE 3 SENSOR MISC: 1.0000 | Freq: Every day | 3 refills | Status: DC

## 2022-02-07 MED ORDER — HYDRALAZINE HCL 100 MG PO TABS: 100.0000 mg | ORAL_TABLET | Freq: Three times a day (TID) | ORAL | 0 refills | Status: DC

## 2022-02-07 MED ORDER — METOPROLOL SUCCINATE ER 50 MG PO TB24: 50.0000 mg | ORAL_TABLET | Freq: Every day | ORAL | 0 refills | Status: DC

## 2022-02-07 MED ORDER — ATORVASTATIN CALCIUM 40 MG PO TABS: 40.0000 mg | ORAL_TABLET | Freq: Every day | ORAL | 0 refills | Status: DC

## 2022-02-07 MED ORDER — ISOSORBIDE MONONITRATE ER 120 MG PO TB24: 120.0000 mg | ORAL_TABLET | Freq: Every day | ORAL | 0 refills | Status: DC

## 2022-02-07 MED ORDER — FREESTYLE LIBRE READER DEVI
1.0000 | Freq: Every day | 3 refills | Status: DC
Start: 2022-02-07 — End: 2022-02-07

## 2022-02-07 MED ORDER — SACUBITRIL-VALSARTAN 49-51 MG PO TABS: 1.0000 | ORAL_TABLET | Freq: Two times a day (BID) | ORAL | 0 refills | Status: DC

## 2022-02-07 MED ORDER — DAPAGLIFLOZIN PROPANEDIOL 10 MG PO TABS
10.0000 mg | ORAL_TABLET | Freq: Every day | ORAL | 0 refills | Status: DC
Start: 2022-02-07 — End: 2022-05-10

## 2022-02-07 MED ORDER — MELATONIN 3 MG PO TABS
3.0000 mg | ORAL_TABLET | Freq: Every day | ORAL | 0 refills | Status: DC
Start: 2022-02-07 — End: 2022-02-20

## 2022-02-07 MED ORDER — VITAMIN D (ERGOCALCIFEROL) 1.25 MG (50000 UNIT) PO CAPS: 50000.0000 [IU] | ORAL_CAPSULE | ORAL | 0 refills | Status: DC

## 2022-02-07 MED ORDER — FREESTYLE LIBRE READER DEVI: 1.0000 | Freq: Every day | 3 refills | Status: DC

## 2022-02-07 NOTE — Patient Instructions (Signed)

## 2022-02-07 NOTE — Progress Notes (Signed)
Occupational Therapy Session Note  Patient Details  Name: Joseph Patrick MRN: [id number] Date of Birth: 1946-08-16  Today's Date: 02/07/2022 OT Individual Time: 0800-0900 OT Individual Time Calculation (min): 60 min    Short Term Goals: Week 2:  OT Short Term Goal 1 (Week 2): STGs=LTGs  Skilled Therapeutic Interventions/Progress Updates:     S: Pt reports that he worked his arm and hand a lot this morning already on his own and it's tired.    O: - ADL re-training: Patient able to complete all bathing, dressing, and grooming tasks at Mod I. Requested to wash up at sink versus take a shower due to lack of space in shower. Completed tub/shower transfer at [facility] assist while problem solving the safest technique to use with set-up at home. Grab bar is vertical in the rear of the shower. Patient states that a shower chair has been ordered. Discussed that additional grab bars would beneficial as he used additional grab bars during transfer.  - AA/ROM: supine: shoulder, flexion, protraction, IR/er, flexion, 10X - AA/ROM: seated, shoulder, abduction, 10X - 9 hole peg test: left: 20.7" Right: unable to complete even with set-up from OT - Toileting: mod I using straight cane while standing.     A: Session focused on ADL re-training and education for safety upon returning home when taking showers. Patient verbalized that he wants to purchase 2 suction cup grab bars to use in his shower after completing tub/shower transfer with OT. Pt was very focused on what he can do with his right arm as well as what he can't do. Focused on scapular strength and stability while using cane to complete AA/ROM exercises. HEP was provided. Pt was able to verbalize and demonstrate understanding. Education given on reason for decreased muscle fatigue and the importance of taking rest breaks and not over working his RUE before he has therapy session.    P: Continue to work on RUE scapular strength fine motor coordination.     Therapy Documentation Precautions:  Precautions Precautions: Fall Precaution Comments: R hemiparesis and foot drop, decreased insight and questionable safety awareness Restrictions Weight Bearing Restrictions: No  Pain: Pain Assessment Pain Scale: 0-10 Pain Score: 0-No pain Reported his hips were sore from working them so much in therapy yesterday. No number provided. Accommodated by decreasing speed during walking and monitored during session    02/07/22 1034  ADL  Eating Modified independent  Where Assessed-Eating Chair  Grooming Modified independent  Where Assessed-Grooming Standing at sink  Upper Body Bathing Modified independent  Where Assessed-Upper Body Bathing Sitting at sink  Lower Body Bathing Modified independent  Where Assessed-Lower Body Bathing Sitting at sink  Upper Body Dressing Modified independent (Device)  Where Assessed-Upper Body Dressing Chair  Lower Body Dressing Modified independent  Where Assessed-Lower Body Dressing Chair  Toileting Modified independent  Where Assessed-Toileting Toilet  Toilet Transfer Modified independent  Toilet Transfer Method Ambulating  Toilet Transfer Equipment Grab bars  Tub/Shower Transfer Contact guard  Tub/Shower Transfer Method Ambulating  Tub/Shower Equipment Shower seat without back;Grab bars     02/07/22 0838  CareTool - Hearing  Ability to hear (with hearing aid or hearing appliances if normally used) 0. Adequate - no difficulty in normal conservation, social interaction, listening to TV  CareTool - Expression  Expression of Ideas and Wants 4. Without difficulty (complex and basic) - expresses complex messages without difficulty and with speech that is clear and easy to understand  CareTool - Comprehension  Understanding Verbal and Non-Verbal Content  4. Understands (complex and basic) - clear comprehension without cues or repetitions  CareTool - Memory  Memory/Recall Ability  Current season;That he or she is  in a hospital/hospital unit;Staff names and faces;Location of own room  CareTool - Signs and Symptoms of Delirium (from CAM)  Is there evidence of an acute change in mental status from the patient's baseline? 0 No  Inattention 0 Behavior not present  Disorganized thinking 0 Behavior not present  Altered level of consciousness  0 Behavior not present  Positive CAM assessment intervention/preventative measures Universal precautions (preventative) measures initiated;Utilize bed alarms    Therapy/Group: Individual Therapy  Ailene Ravel, OTR/L,CBIS  Supplemental OT - [facility] and [facility]  02/07/2022, 8:47 AM

## 2022-02-07 NOTE — Progress Notes (Addendum)
Patient ID: Joseph Patrick, male   DOB: 15-Feb-1946, 76 y.o.   MRN: [id number]  Spoke with niece to update equipment ordered co-pay needing to be paid prior to delivery of equipment. Home health arrangements and she had questions regarding getting BP cuff and the free style libre for him to be easier to check blood sugars. Messaged MD regarding the medical questions.  11:52 AM Met with pt to update him regarding team conference he is on board with discharge tomorrow and feels ready. Aware of no driving. Follow up later to see if equipment was delivered.

## 2022-02-07 NOTE — Patient Care Conference (Signed)
Inpatient RehabilitationTeam Conference and Plan of Care Update Date: 02/07/2022   Time:11:05 AM    Patient Name: Joseph Patrick      Medical Record Number: [id number]  Date of Birth: 04-17-1946 Sex: Male         Room/Bed: 4M04C/4M04C-01 Payor Info: Payor: [employer] MEDICARE / Plan: [employer] MEDICARE HMO / Product Type: *No Product type* /    Admit Date/Time:  01/22/2022  2:04 PM  Primary Diagnosis:  Small vessel disease, cerebrovascular  Hospital Problems: Principal Problem:   Small vessel disease, cerebrovascular    Expected Discharge Date: Expected Discharge Date: 02/08/22  Team Members Present: Physician leading conference: Dr. Sula Soda Social Worker Present: Dossie Der, LCSW Nurse Present: Chana Bode, RN PT Present: Wynelle Link, PT OT Present: Dolphus Jenny, OT SLP Present: Sarita Bottom, SLP PPS Coordinator present : Fae Pippin, SLP     Current Status/Progress Goal Weekly Team Focus  Bowel/Bladder   (P) cont b/b LBM 5/22  (P) remain cont.  (P) assess a shift and prn   Swallow/Nutrition/ Hydration             ADL's   mod I overall  mod I  RUE NMR, self care/balance/transfer retraining, pt edu   Mobility   mod I bed mobiltiy, mod I sit<>stand using cane, mod I bed<>chair transfers using cane, supervision gait >129ft with cane, CGA stairs using hand rails.  Goal level - supervision/mod I  RLE NMR, RUE NMR, gait training, DC planning, safety awareness   Communication             Safety/Cognition/ Behavioral Observations            Pain   (P) no current complaints of pain  (P) <3/10  (P) assess q shift and prn   Skin   (P) skin intact  (P) no new breakdown        Discharge Planning:  Pt doing well meeting goals and will discharge tomorrow instead of Friday. Will have home health at [place]   Team Discussion: Patient remains impulsive. MD ordered continuous glucometer for home. Adjusted medication for DM.  Patient on target to meet rehab goals: yes,  currently mod I for ADLs  *See Care Plan and progress notes for long and short-term goals.   Revisions to Treatment Plan:  N/A   Teaching Needs: Safety, medications, dietary modifications, transfers,etc.  Current Barriers to Discharge: Home enviroment access/layout and Lack of/limited family support  Possible Resolutions to Barriers: Family education HH follow up services DME: [facility]      Medical Summary Current Status: hypertension, type 2 DM  Barriers to Discharge: Medical stability  Barriers to Discharge Comments: hypertension, type 2 DM Possible Resolutions to [employer] Focus: discussed 4 blood pressure medications, increase in Farxiga, stable for d/c tomorrow   Continued Need for Acute Rehabilitation Level of Care: The patient requires daily medical management by a physician with specialized training in physical medicine and rehabilitation for the following reasons: Direction of a multidisciplinary physical rehabilitation program to maximize functional independence : Yes Medical management of patient stability for increased activity during participation in an intensive rehabilitation regime.: Yes Analysis of laboratory values and/or radiology reports with any subsequent need for medication adjustment and/or medical intervention. : Yes   I attest that I was present, lead the team conference, and concur with the assessment and plan of the team.   Chana Bode B 02/07/2022, 3:07 PM

## 2022-02-07 NOTE — Progress Notes (Signed)
Physical Therapy Session Note  Patient Details  Name: Joseph Patrick MRN: [id number] Date of Birth: 1946-06-05  Today's Date: 02/07/2022 PT Individual Time: 1000-1100 PT Individual Time Calculation (min): 60 min   Short Term Goals: Week 3:  PT Short Term Goal 1 (Week 3): STG = LTG  Skilled Therapeutic Interventions/Progress Updates:     Pt sitting in recliner to start - agreeable to PT tx and denies pain. Sit<>stand using SPC with supervision. Ambulated to ortho rehab gym with [facility] and supervision, ~170f. Car transfer completed with supervision with car height simulating sedan - able to manage LE without assist. Practiced navigating 812framp, supervision with SPC provided for safety - pt with good safety awareness and slowing down pace while doing unlevel surfaces. Gait ~10029fith supervision and SPC to main rehab gym. Stair training up/down x12, 6inch steps using 1 hand rail on L. Able to navigate steps with supervision and no physical assist, only min cues for sequencing safely. Completed BERG balance test with results outlined below.   Patient demonstrates increased fall risk as noted by score of   49/56 on Berg Balance Scale.  (<36= high risk for falls, close to 100%; 37-45 significant >80%; 46-51 moderate >50%; 52-55 lower >25%).  TUG with SPC and supervision 22 seconds *Scores >13.5 seconds indicate increased falls risk.  Nustep completed x10 minutes with resistance set to 7, ace wrapped RUE to allow integration for exercise. Maintained ~40 steps/minute cadence.  Ambulated back to his room, ~150f30fith supervision and SPC. Concluded session seated in recliner with all needs met.   Therapy Documentation Precautions:  Precautions Precautions: Fall Precaution Comments: R hemiparesis and foot drop, decreased insight and questionable safety awareness Restrictions Weight Bearing Restrictions: No General:    Balance Balance Balance Assessed: Yes Standardized Balance  Assessment Standardized Balance Assessment: Berg Balance Test;Timed Up and Go Test Berg Balance Test Sit to Stand: Able to stand without using hands and stabilize independently Standing Unsupported: Able to stand safely 2 minutes Sitting with Back Unsupported but Feet Supported on Floor or Stool: Able to sit safely and securely 2 minutes Stand to Sit: Sits safely with minimal use of hands Transfers: Able to transfer safely, minor use of hands Standing Unsupported with Eyes Closed: Able to stand 10 seconds safely Standing Ubsupported with Feet Together: Able to place feet together independently and stand 1 minute safely From Standing, Reach Forward with Outstretched Arm: Can reach forward >12 cm safely (5") From Standing Position, Pick up Object from Floor: Able to pick up shoe safely and easily From Standing Position, Turn to Look Behind Over each Shoulder: Looks behind from both sides and weight shifts well Turn 360 Degrees: Needs close supervision or verbal cueing Standing Unsupported, Alternately Place Feet on Step/Stool: Able to stand independently and complete 8 steps >20 seconds Standing Unsupported, One Foot in Front: Able to plae foot ahead of the other independently and hold 30 seconds Standing on One Leg: Able to lift leg independently and hold 5-10 seconds Total Score: 49/56 Therapy/Group: Individual Therapy  ChriAlger Simons4/2023, 7:19 AM

## 2022-02-07 NOTE — Progress Notes (Signed)
PROGRESS NOTE   Subjective/Complaints: Updated niece in room Discussed that he will likely need 4 BP medications at home Will order continuous blood glucose monitor upon d/c tomorrow  ROS: Denies CP or SOB, +fatigue-improved, +right sided weakness-improving, negative for dizziness or pain.  +insomnia- resolved  Objective:   No results found. No results for input(s): WBC, HGB, HCT, PLT in the last 72 hours.  Recent Labs    02/05/22 0638  NA 141  K 4.1  CL 109  CO2 27  GLUCOSE 105*  BUN 12  CREATININE 1.48*  CALCIUM 9.0     Intake/Output Summary (Last 24 hours) at 02/07/2022 1001 Last data filed at 02/07/2022 0900 Gross per 24 hour  Intake 960 ml  Output 2025 ml  Net -1065 ml        Physical Exam: Vital Signs Blood pressure (!) 160/72, pulse 65, temperature 98.4 F (36.9 C), resp. rate 18, height 5\' 9"  (1.753 m), weight 77 kg, SpO2 98 %. Physical Exam Gen: no distress, normal appearing, I n bed, BMI 24.87 HEENT: oral mucosa pink and moist, NCAT Cardio: Bradycardia Chest: normal effort, normal rate of breathing Abd: soft, non-distended, not tender Ext: no edema, TED garments in place Psych: pleasant, normal affect and behavior  Skin: intact Neurological/MSK    Comments: Patient is alert and oriented x3. Able to converse well and can follow basic commands. Decreased safety awareness Motor strength: 3/5 RUE, 3/5 RLE except for 0/5 EHL and DF. Sensation is intact. Slow to move fingers of right hand. AFO in place RLE. Decreased insight into deficits Ambulating S, using 1 handrail on left side for support on stairs, using single point cane in hallway +perseveration on weakness   Assessment/Plan: 1. Functional deficits which require 3+ hours per day of interdisciplinary therapy in a comprehensive inpatient rehab setting. Physiatrist is providing close team supervision and 24 hour management of active medical  problems listed below. Physiatrist and rehab team continue to assess barriers to discharge/monitor patient progress toward functional and medical goals  Care Tool:  Bathing    Body parts bathed by patient: Right arm, Left arm, Chest, Abdomen, Front perineal area, Buttocks, Right upper leg, Left upper leg, Right lower leg, Left lower leg, Face   Body parts bathed by helper: Left arm     Bathing assist Assist Level: Independent with assistive device Assistive Device Comment: shower chair   Upper Body Dressing/Undressing Upper body dressing   What is the patient wearing?: Pull over shirt    Upper body assist Assist Level: Independent with assistive device    Lower Body Dressing/Undressing Lower body dressing      What is the patient wearing?: Pants     Lower body assist Assist for lower body dressing: Independent with assitive device     Toileting Toileting Toileting Activity did not occur (Clothing management and hygiene only): N/A (no void or bm)  Toileting assist Assist for toileting: Independent with assistive device Assistive Device Comment: grab bar   Transfers Chair/bed transfer  Transfers assist     Chair/bed transfer assist level: Contact Guard/Touching assist     Locomotion Ambulation   Ambulation assist  Assist level: Contact Guard/Touching assist Assistive device: Cane-straight Max distance: 150   Walk 10 feet activity   Assist     Assist level: Contact Guard/Touching assist Assistive device: Cane-quad, Cane-straight   Walk 50 feet activity   Assist    Assist level: Contact Guard/Touching assist Assistive device: Cane-straight, Cane-quad    Walk 150 feet activity   Assist    Assist level: Contact Guard/Touching assist Assistive device: Cane-straight, Cane-quad    Walk 10 feet on uneven surface  activity   Assist Walk 10 feet on uneven surfaces activity did not occur: Safety/medical concerns          Wheelchair     Assist Is the patient using a wheelchair?: Yes Type of Wheelchair: Manual Wheelchair activity did not occur: N/A  Wheelchair assist level: Supervision/Verbal cueing Max wheelchair distance: 25    Wheelchair 50 feet with 2 turns activity    Assist    Wheelchair 50 feet with 2 turns activity did not occur: N/A       Wheelchair 150 feet activity     Assist  Wheelchair 150 feet activity did not occur: N/A       Blood pressure (!) 160/72, pulse 65, temperature 98.4 F (36.9 C), resp. rate 18, height 5\' 9"  (1.753 m), weight 77 kg, SpO2 98 %.    Medical Problem List and Plan: 1. 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  2.  Impaired mobility, ambulating 150 feet d/c Lovenox. Continue Aspirin 81 mg daily and Plavix 75 mg daily with initial plan x3 weeks then aspirin alone 3. Pain Management: Tylenol as needed 4. Insomnia: resolved, increase magnesium gluconate to 500mg  HS. Discussed with patient.  5. Neuropsych: This patient is capable of making decisions on his own behalf. 6. Skin/Wound Care: Routine skin checks 7. Fluids/Electrolytes/Nutrition: Routine in and outs with follow-up chemistries 8.  Chronic diastolic congestive heart failure.  Follows with Dr. Johnsie Patrick.  Continue Entresto 24-26 mg twice daily.  Monitor for any signs of fluid overload 9.  Hypertension.Change Bidil to Imdur 120mg  daily starting tomorrow, Decrease Toprol XL to 50mg .  Continue Hydralazine to 100mg  TID starting tomorrow Monitor with increased mobility. Provided list of foods to help with blood pressure.  Discontinued order for TEDs. Increase magnesium gluconate to 500mg  HS. Monitor for effects of these changes. Discussed 4 blood pressure medications with neice 10.  Diabetes mellitus.  Hemoglobin A1c 7.1.  Increase Farxiga to 10 mg daily.  Diabetic teaching. Recommended avoiding added sugar. Provided education to avoid foods with added sugar. Reviewed CBGs [id number] and discussed with patient. Continue carb modified diet.  Much improved CBGs, d/c ISS. Freestyle Libre ordered for home  CBG (last 3)  Recent Labs    02/06/22 1614 02/06/22 2111 02/07/22 0617  GLUCAP 145* 129* 107*   11.  Hyperlipidemia. continue Lipitor 12.  CKD stage III.  Follow-up chemistries. Cr 1.35 on most recent check.   -5/22 CR 1.48 today overall stable, continue to monitor 13.  Medical  noncompliance.  Counseling 14. Bradycardia: resolved, continue to monitor HR TID. Will maintain Toprol given HTN.      02/07/2022    9:05 AM 02/07/2022    4:46 AM 02/07/2022    4:45 AM  Vitals with BMI  Weight   169 lbs 12 oz  BMI   123456  Systolic 0000000 0000000   Diastolic 72 69   Pulse 65 65     15. History of being overweight: BMI improved this hospitalization from 26.45 to 24.16. provide dietary education. Provided list of foods to assist in weight loss.  16. RLE foot drop: AFO ordered and applied. Getting newer better fitting AFO for her.  17. Vitamin D deficiency: continue ergocalciferol 50,000U once per week for 7 weeks. Discussed with patient and he is agreeable 18. Poor safety awareness: continue to provide education 19. Fatigue: discussed that Vitamin D supplement can help this. B12 is also at low end of normal, 1,000mg  injected today (5/12). Iron level checked and normal. Discussed with patient.  20. History of TBI with residual cognitive deficits: continue SLP 21. Impulsivity: continue to reinforce importance of safety precautions to avoid falls. Continue bed alarm and safety measures. Discussed with neice 22. Insomnia:  continue melatonin 3mg  HS. Grounds pass ordered for sunlight exposure during the day 23. Suboptimal potassium: resolved, d/c supplement 24. Bradycardia: contiune toprol XL to 50mg , discussed HR still low but improving.    LOS: 16 days A FACE TO FACE EVALUATION WAS PERFORMED  Joseph Patrick P Monik Lins 02/07/2022, 10:01 AM

## 2022-02-07 NOTE — Discharge Summary (Signed)
Physical Therapy Discharge Summary  Patient Details  Name: Joseph Patrick MRN: [id number] Date of Birth: 1946-08-03  Patient has met 8 of 8 long term goals due to improved activity tolerance, improved balance, improved postural control, increased strength, ability to compensate for deficits, functional use of  right upper extremity and right lower extremity, improved attention, and improved awareness.  Patient to discharge at an ambulatory level Supervision.   Patient's care partner is independent to provide the necessary physical and cognitive assistance at discharge.  Reasons goals not met: n/a  Recommendation:  Patient will benefit from ongoing skilled PT services in home health setting to continue to advance safe functional mobility, address ongoing impairments in R sided weakness, home safety, fall prevention, gait training, and minimize fall risk.  Equipment: SPC  Reasons for discharge: treatment goals met and discharge from hospital  Patient/family agrees with progress made and goals achieved: Yes  PT Discharge Precautions/Restrictions Precautions Precautions: Fall Precaution Comments: R hemi with foot drop, decreased insight and safety awareness Restrictions Weight Bearing Restrictions: No Vital Signs Therapy Vitals Pulse Rate: 65 BP: (!) 160/72 Pain Pain Assessment Pain Scale: 0-10 Pain Score: 0-No pain Pain Interference Pain Interference Pain Effect on Sleep: 0. Does not apply - I have not had any pain or hurting in the past 5 days Pain Interference with Therapy Activities: 0. Does not apply - I have not received rehabilitationtherapy in the past 5 days Pain Interference with Day-to-Day Activities: 1. Rarely or not at all Vision/Perception  Vision - History Ability to See in Adequate Light: 0 Adequate Perception Perception: Within Functional Limits Praxis Praxis: Intact  Cognition Overall Cognitive Status: History of cognitive impairments - at  baseline Arousal/Alertness: Awake/alert Orientation Level: Oriented X4 Focused Attention: Appears intact Sustained Attention: Appears intact Selective Attention: Appears intact Memory: Appears intact Awareness: Appears intact Problem Solving: Appears intact Safety/Judgment: Impaired Sensation Sensation Hot/Cold: Appears Intact Proprioception: Impaired by gross assessment Stereognosis: Not tested Additional Comments: Reports numbness in R foot and R hand. Distal > proximal Coordination Gross Motor Movements are Fluid and Coordinated: No Coordination and Movement Description: Impacted by R hemi and foot drop Motor  Motor Motor: Hemiplegia Motor - Discharge Observations: R hemi - improved since date of evaluation  Mobility Bed Mobility Bed Mobility: Supine to Sit;Sit to Supine Supine to Sit: Independent with assistive device Sit to Supine: Independent with assistive device Transfers Transfers: Sit to Stand;Stand Pivot Transfers;Stand to Sit Sit to Stand: Independent with assistive device Stand to Sit: Independent with assistive device Stand Pivot Transfers: Supervision/Verbal cueing Stand Pivot Transfer Details: Verbal cues for precautions/safety Transfer (Assistive device): Straight cane Locomotion  Gait Ambulation: Yes Gait Assistance: Supervision/Verbal cueing Gait Distance (Feet): 200 Feet Assistive device: Straight cane Gait Assistance Details: Verbal cues for precautions/safety;Verbal cues for gait pattern Gait Gait: Yes Gait Pattern: Impaired Gait Pattern: Step-through pattern;Poor foot clearance - right;Decreased stance time - right;Decreased hip/knee flexion - right;Decreased dorsiflexion - right Gait velocity: decreased Stairs / Additional Locomotion Stairs: Yes Stairs Assistance: Supervision/Verbal cueing Stair Management Technique: One rail Left;Step to pattern;Forwards Number of Stairs: 12 Height of Stairs: 6 Ramp: Supervision/Verbal cueing Wheelchair  Mobility Wheelchair Mobility: No  Trunk/Postural Assessment  Cervical Assessment Cervical Assessment: Within Functional Limits Thoracic Assessment Thoracic Assessment: Exceptions to [facility] (rounded shoulders) Lumbar Assessment Lumbar Assessment: Exceptions to [facility] (flexible posterior pelvic tilt) Postural Control Postural Control: Within Functional Limits  Balance Balance Balance Assessed: Yes Standardized Balance Assessment Standardized Balance Assessment: Berg Balance Test;Timed Up and Go Test [employer]  Test Sit to Stand: Able to stand without using hands and stabilize independently Standing Unsupported: Able to stand safely 2 minutes Sitting with Back Unsupported but Feet Supported on Floor or Stool: Able to sit safely and securely 2 minutes Stand to Sit: Sits safely with minimal use of hands Transfers: Able to transfer safely, minor use of hands Standing Unsupported with Eyes Closed: Able to stand 10 seconds safely Standing Ubsupported with Feet Together: Able to place feet together independently and stand 1 minute safely From Standing, Reach Forward with Outstretched Arm: Can reach forward >12 cm safely (5") From Standing Position, Pick up Object from Floor: Able to pick up shoe safely and easily From Standing Position, Turn to Look Behind Over each Shoulder: Looks behind from both sides and weight shifts well Turn 360 Degrees: Needs close supervision or verbal cueing Standing Unsupported, Alternately Place Feet on Step/Stool: Able to stand independently and complete 8 steps >20 seconds Standing Unsupported, One Foot in Front: Able to plae foot ahead of the other independently and hold 30 seconds Standing on One Leg: Able to lift leg independently and hold 5-10 seconds Total Score: 49 Timed Up and Go Test TUG: Normal TUG (with SPC) Normal TUG (seconds): 22 Extremity Assessment      RLE Assessment RLE Assessment: Exceptions to [facility] RLE Strength RLE Overall Strength:  Deficits Right Hip Flexion: 3+/5 Right Hip ABduction: 3+/5 Right Hip ADduction: 3+/5 Right Knee Flexion: 3+/5 Right Knee Extension: 4/5 Right Ankle Dorsiflexion: 1/5 Right Ankle Plantar Flexion: 1/5 LLE Assessment LLE Assessment: Within Functional Limits    Cerrone Debold P Leata Dominy PT 02/07/2022, 10:24 AM

## 2022-02-07 NOTE — Progress Notes (Signed)
Inpatient Rehabilitation Discharge Medication Review by a Pharmacist  A complete drug regimen review was completed for this patient to identify any potential clinically significant medication issues.  High Risk Drug Classes Is patient taking? Indication by Medication  Antipsychotic No   Anticoagulant No   Antibiotic No   Opioid No   Antiplatelet Yes Aspirin-CVA PPX  Hypoglycemics/insulin Yes Farxiga- T2DM  Vasoactive Medication Yes Toprol XL, imdur, hydralazine-HTN Entresto-CHF  Chemotherapy No   Other Yes Lipitor-HLD Melatonin- sleep     Type of Medication Issue Identified Description of Issue Recommendation(s)  Drug Interaction(s) (clinically significant)     Duplicate Therapy     Allergy     No Medication Administration End Date     Incorrect Dose     Additional Drug Therapy Needed     Significant med changes from prior encounter (inform family/care partners about these prior to discharge).    Other       Clinically significant medication issues were identified that warrant physician communication and completion of prescribed/recommended actions by midnight of the next day:  No   Time spent performing this drug regimen review (minutes):  30   Cristal Howatt BS, PharmD, BCPS Clinical Pharmacist 02/07/2022 1:11 PM  Contact: [phone] after 3 PM  "Be curious, not judgmental..." -Debbora Dus

## 2022-02-07 NOTE — Plan of Care (Signed)
  Problem: RH Furniture Transfers Goal: LTG Patient will perform furniture transfers w/assist (OT/PT) Description: LTG: Patient will perform furniture transfers  with assistance (OT/PT). Outcome: Completed/Met   Problem: RH Balance Goal: LTG Patient will maintain dynamic standing balance (PT) Description: LTG:  Patient will maintain dynamic standing balance with assistance during mobility activities (PT) Outcome: Completed/Met   Problem: Sit to Stand Goal: LTG:  Patient will perform sit to stand with assistance level (PT) Description: LTG:  Patient will perform sit to stand with assistance level (PT) Outcome: Completed/Met   Problem: RH Bed Mobility Goal: LTG Patient will perform bed mobility with assist (PT) Description: LTG: Patient will perform bed mobility with assistance, with/without cues (PT). Outcome: Completed/Met   Problem: RH Bed to Chair Transfers Goal: LTG Patient will perform bed/chair transfers w/assist (PT) Description: LTG: Patient will perform bed to chair transfers with assistance (PT). Outcome: Completed/Met   Problem: RH Car Transfers Goal: LTG Patient will perform car transfers with assist (PT) Description: LTG: Patient will perform car transfers with assistance (PT). Outcome: Completed/Met   Problem: RH Ambulation Goal: LTG Patient will ambulate in controlled environment (PT) Description: LTG: Patient will ambulate in a controlled environment, # of feet with assistance (PT). Outcome: Completed/Met Goal: LTG Patient will ambulate in home environment (PT) Description: LTG: Patient will ambulate in home environment, # of feet with assistance (PT). Outcome: Completed/Met

## 2022-02-07 NOTE — Progress Notes (Signed)
Occupational Therapy Discharge Summary  Patient Details  Name: Joseph Patrick MRN: [id number] Date of Birth: 03-Aug-1946  Today's Date: 02/07/2022 OT Individual Time: 1130-1200 OT Individual Time Calculation (min): 30 min    Patient has met 14 of 14 long term goals due to improved activity tolerance, improved balance, postural control, ability to compensate for deficits, functional use of  RIGHT upper and RIGHT lower extremity, improved attention, improved awareness, and improved coordination.  Patient to discharge at overall Modified Independent level using single point cane.  Patient's care partner is independent to provide the necessary intermittent cognitive assistance at discharge.    Skilled Intervention:  Pt sitting up in recliner, eager to complete OT session.  Pt completed sit to stand with mod I and ambulated ~40 feet to large gym using [place] with close suervision.  Neuro re-ed completed to right wrist and hand with emphasis on tenodesis and inhibition of wrist flexors during finger flexion to encourage increased activation of wrist extensors.  Applied attended NMES to wrist extensors for increased feedback; Used the following settings: 35 Mhz,300 [place], 2 sec on/off ramp, 1:3 on/off ratio, 32 intensity.  Pt ambulated back to room with close supervision and stand to sit with mod I.    Recommendation:  Patient will benefit from ongoing skilled OT services in home health setting to continue to advance functional skills in the area of BADL and iADL, neuro re-ed and strengthening of RUE and RLE.  Equipment: BSC, SPC, shower bench  Reasons for discharge: treatment goals met and discharge from hospital  Patient/family agrees with progress made and goals achieved: Yes  OT Discharge Precautions/Restrictions  Precautions Precautions: Fall Precaution Comments: R hemi with foot drop, decreased insight and safety awareness Restrictions Weight Bearing Restrictions: No Pain Pain Assessment Pain  Scale: 0-10 Pain Score: 0-No pain ADL ADL Eating: Modified independent Where Assessed-Eating: Chair Grooming: Modified independent Where Assessed-Grooming: Standing at sink Upper Body Bathing: Modified independent Where Assessed-Upper Body Bathing: Sitting at sink Lower Body Bathing: Modified independent Where Assessed-Lower Body Bathing: Sitting at sink Upper Body Dressing: Modified independent (Device) Where Assessed-Upper Body Dressing: Chair Lower Body Dressing: Modified independent Where Assessed-Lower Body Dressing: Chair Toileting: Modified independent Where Assessed-Toileting: Glass blower/designer: Diplomatic Services operational officer Method: Counselling psychologist: Energy manager: Metallurgist Method: Optometrist: Civil engineer, contracting without back, Energy manager: Not assessed Vision Baseline Vision/History: 1 Wears glasses Patient Visual Report: No change from baseline Vision Assessment?: No apparent visual deficits Perception  Perception: Within Functional Limits Praxis Praxis: Intact Cognition Cognition Overall Cognitive Status: History of cognitive impairments - at baseline Arousal/Alertness: Awake/alert Orientation Level: Person;Place;Situation Person: Oriented Place: Oriented Situation: Oriented Memory: Appears intact Memory Impairment: Decreased recall of new information Focused Attention: Appears intact Sustained Attention: Appears intact Selective Attention: Appears intact Awareness: Appears intact Problem Solving: Appears intact Safety/Judgment: Impaired Comments: Pt continues to present with rapid/pressed speaking rate, mildly tagential, and slightly impulsive - states this to be baseline Sensation Sensation Light Touch: Appears Intact Hot/Cold: Appears Intact Proprioception: Impaired by gross assessment Stereognosis: Not tested Additional Comments: Reports  numbness in R foot and R hand. Distal > proximal Coordination Gross Motor Movements are Fluid and Coordinated: No Fine Motor Movements are Fluid and Coordinated: No Finger Nose Finger Test: significantly impaired RUE; WNL LUE Motor  Motor Motor: Hemiplegia Motor - Discharge Observations: R hemi - improved since date of evaluation Mobility  Transfers Sit to Stand: Independent with assistive device Stand to  Sit: Independent with assistive device  Trunk/Postural Assessment  Cervical Assessment Cervical Assessment: Within Functional Limits Thoracic Assessment Thoracic Assessment: Exceptions to [facility] (scapular winging and upward rotation right) Lumbar Assessment Lumbar Assessment: Exceptions to [facility] (posterior pelvic tilt) Postural Control Postural Control: Within Functional Limits  Balance Balance Balance Assessed: Yes Static Sitting Balance Static Sitting - Balance Support: No upper extremity supported;Feet supported Static Sitting - Level of Assistance: 7: Independent Dynamic Sitting Balance Dynamic Sitting - Balance Support: No upper extremity supported;Feet supported;During functional activity Dynamic Sitting - Level of Assistance: 6: Modified independent (Device/Increase time) Static Standing Balance Static Standing - Balance Support: No upper extremity supported Static Standing - Level of Assistance: 6: Modified independent (Device/Increase time) Dynamic Standing Balance Dynamic Standing - Balance Support: No upper extremity supported;During functional activity Dynamic Standing - Level of Assistance: 5: Stand by assistance;6: Modified independent (Device/Increase time) Extremity/Trunk Assessment RUE Assessment RUE Assessment: Exceptions to [facility] RUE Body System: Neuro Brunstrum levels for arm and hand: Arm;Hand Brunstrum level for arm: Stage III Synergy is performed voluntarily;Stage IV Movement is deviating from synergy Brunstrum level for hand: Stage III Synergies performed  voluntarily LUE Assessment LUE Assessment: Within Functional Limits   Joseph Patrick Joseph Patrick Joseph Patrick 02/07/2022, 3:31 PM

## 2022-02-07 NOTE — Progress Notes (Signed)
Inpatient Rehabilitation Care Coordinator Discharge Note   Patient Details  Name: Joseph Patrick MRN: [id number] Date of Birth: 03-06-1946   Discharge location: HOME WITH INTERMITTENT ASSIST FROM SISTER AND NIECE  Length of Stay: 17 days  Discharge activity level: SUPERVISION-MOD/I LEVEL  Home/community participation: ACTIVE  Patient response WU:JWJXBJ Literacy - How often do you need to have someone help you when you read instructions, pamphlets, or other written material from your doctor or pharmacy?: Never  Patient response YN:WGNFAO Isolation - How often do you feel lonely or isolated from those around you?: Never  Services provided included: MD, RD, PT, OT, SLP, RN, CM, TR, Pharmacy, SW  Financial Services:  Field seismologist Utilized: Scientific laboratory technician MEDICARE  Choices offered to/list presented to: PT  Follow-up services arranged:  Home Health, DME, Patient/Family has no preference for HH/DME agencies Home Health Agency: [facility] PT & OT    DME : [facility], 3 IN 1 AND TUB BENCH    Patient response to transportation need: Is the patient able to respond to transportation needs?: Yes In the past 12 months, has lack of transportation kept you from medical appointments or from getting medications?: No In the past 12 months, has lack of transportation kept you from meetings, work, or from getting things needed for daily living?: No    Comments (or additional information):PT DID WELL AND REACHED GOALS AND READY TO GO HOME ONE DAY SOONER. HIS NIECE WILL CHECK ON HIM DAILY AND SISTER LIVES DOWN THE HALL FROM HIM, BUT CAN NOT ASSIST.  Patient/Family verbalized understanding of follow-up arrangements:  Yes  Individual responsible for coordination of the follow-up plan: ANGELA-NIECE  Confirmed correct DME delivered: Lucy Chris 02/07/2022    Lucy Chris

## 2022-02-07 NOTE — Progress Notes (Signed)
Occupational Therapy Session Note  Patient Details  Name: Joseph Patrick MRN: [id number] Date of Birth: [birth year]  Today's Date: 02/07/2022 OT Individual Time: 1430-1530 OT Individual Time Calculation (min): 60 min    Short Term Goals: Week 2:  OT Short Term Goal 1 (Week 2): STGs=LTGs  Skilled Therapeutic Interventions/Progress Updates:    S: Pt reports that he works on his hand at night all the time.    O: - AA/ROM: seated, hand placed on washcloth, shoulder flexion, horizontal abduction/adduction, IR/er, wrist flexion/extension, thumb abduction/adduction, 10-12X each - Proximal shoulder strengthening: seated at table, right palm placed on basketball with arm extended: stabilized for 1'. Palm on basketball side to side movement 1'. Palm on basketball forward and back movement 1'. - Fine motor coordination: Using 2 point pinch (index and thumb) patient stacked 20 foam shapes in pairs.  - Pinch strength: utilized yellow resistive clothespin and lateral pinch to pick up 10 foam shapes and place on container lid. - Ambulated to and from room at [facility] with single point cane.  A: Patient demonstrated max difficulty with fine motor task and pinch strengthening when attempting to pick up and use his entire RUE. VC provided to place elbow on folded washcloth to take away the need to stabilize his entire arm. He then able to focus on his hand, wrist, and elbow. Increased muscle fatigue noted in right UE when completing all activities. VC were provided for form and technique and to when take a rest break.  P: Planning to discharge home tomorrow with continued therapy services.   Therapy Documentation Precautions:  Precautions Precautions: Fall Precaution Comments: R hemi with foot drop, decreased insight and safety awareness Restrictions Weight Bearing Restrictions: No  Pain: Pain Assessment Pain Scale: 0-10 Pain Score: 0-No pain    Therapy/Group: Individual Therapy   Limmie Patricia, OTR/L,CBIS  Supplemental OT - [facility] and [facility]  02/07/2022, 3:54 PM

## 2022-02-08 ENCOUNTER — Other Ambulatory Visit (HOSPITAL_COMMUNITY): Payer: Self-pay

## 2022-02-08 LAB — GLUCOSE, CAPILLARY: Glucose-Capillary: 161 mg/dL — ABNORMAL HIGH (ref 70–99)

## 2022-02-08 NOTE — Progress Notes (Signed)
Patient discharged this shift, voices understanding of discharge instructions. Patient discharged via wheelchair by staff with family by side

## 2022-02-08 NOTE — Progress Notes (Signed)
PROGRESS NOTE   Subjective/Complaints: Discussed blood pressure- he asks how many medications he will go home on He is willing to check blood sugars at home daily.  ROS: Denies CP or SOB, +fatigue-improved, +right sided weakness-improving, negative for dizziness or pain.  +insomnia- resolved  Objective:   No results found. No results for input(s): WBC, HGB, HCT, PLT in the last 72 hours.  No results for input(s): NA, K, CL, CO2, GLUCOSE, BUN, CREATININE, CALCIUM in the last 72 hours.    Intake/Output Summary (Last 24 hours) at 02/08/2022 0954 Last data filed at 02/08/2022 0723 Gross per 24 hour  Intake 960 ml  Output 325 ml  Net 635 ml        Physical Exam: Vital Signs Blood pressure (!) 168/72, pulse 62, temperature 98.2 F (36.8 C), resp. rate 18, height 5\' 9"  (1.753 m), weight 75.3 kg, SpO2 97 %. Physical Exam Gen: in no distress, normal appearing, I n bed, BMI 24.87, seated in chair,  HEENT: oral mucosa pink and moist, NCAT Cardio: Bradycardia Chest: normal effort, normal rate of breathing Abd: soft, non-distended, not tender Ext: no edema, TED garments in place Psych: pleasant, normal affect and behavior  Skin: intact Neurological/MSK    Comments: Patient is alert and oriented x3. Able to converse well and can follow basic commands.  Motor strength: 3/5 RUE, 3/5 RLE except for 0/5 EHL and DF. Sensation is intact. Slow to move fingers of right hand. AFO in place RLE. Decreased insight into deficits Ambulating S, using 1 handrail on left side for support on stairs, using single point cane in hallway Demonstrating improved safety awareness +perseveration on weakness   Assessment/Plan: 1. Functional deficits which require 3+ hours per day of interdisciplinary therapy in a comprehensive inpatient rehab setting. Physiatrist is providing close team supervision and 24 hour management of active medical problems  listed below. Physiatrist and rehab team continue to assess barriers to discharge/monitor patient progress toward functional and medical goals  Care Tool:  Bathing    Body parts bathed by patient: Right arm, Left arm, Chest, Abdomen, Front perineal area, Buttocks, Right upper leg, Left upper leg, Right lower leg, Left lower leg, Face   Body parts bathed by helper: Left arm     Bathing assist Assist Level: Independent with assistive device Assistive Device Comment: shower chair   Upper Body Dressing/Undressing Upper body dressing   What is the patient wearing?: Pull over shirt    Upper body assist Assist Level: Independent with assistive device    Lower Body Dressing/Undressing Lower body dressing      What is the patient wearing?: Pants     Lower body assist Assist for lower body dressing: Independent with assitive device     Toileting Toileting Toileting Activity did not occur (Clothing management and hygiene only): N/A (no void or bm)  Toileting assist Assist for toileting: Independent with assistive device Assistive Device Comment: grab bar   Transfers Chair/bed transfer  Transfers assist     Chair/bed transfer assist level: Independent with assistive device Chair/bed transfer assistive device: Research officer, political party   Ambulation assist      Assist level: Supervision/Verbal cueing Assistive  device: Cane-straight Max distance: 150   Walk 10 feet activity   Assist     Assist level: Supervision/Verbal cueing Assistive device: Cane-straight   Walk 50 feet activity   Assist    Assist level: Supervision/Verbal cueing Assistive device: Cane-straight    Walk 150 feet activity   Assist    Assist level: Supervision/Verbal cueing Assistive device: Cane-straight    Walk 10 feet on uneven surface  activity   Assist Walk 10 feet on uneven surfaces activity did not occur: Safety/medical concerns   Assist level: Supervision/Verbal  cueing Assistive device: Cane-straight   Wheelchair     Assist Is the patient using a wheelchair?: No Type of Wheelchair: Manual Wheelchair activity did not occur: N/A  Wheelchair assist level: Supervision/Verbal cueing Max wheelchair distance: 25    Wheelchair 50 feet with 2 turns activity    Assist    Wheelchair 50 feet with 2 turns activity did not occur: N/A       Wheelchair 150 feet activity     Assist  Wheelchair 150 feet activity did not occur: N/A       Blood pressure (!) 168/72, pulse 62, temperature 98.2 F (36.8 C), resp. rate 18, height 5\' 9"  (1.753 m), weight 75.3 kg, SpO2 97 %.    Medical Problem List and Plan: 1. 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  2.  Impaired mobility, ambulating 150 feet d/c Lovenox. Continue Aspirin 81 mg daily and Plavix 75 mg daily with initial plan x3 weeks then aspirin alone 3. Pain Management: Tylenol as needed 4. Insomnia: resolved, increase magnesium gluconate to 500mg  HS. Discussed with patient.  5. Neuropsych: This patient is capable of making decisions on his own behalf. 6. Skin/Wound Care: Routine skin checks 7. Fluids/Electrolytes/Nutrition: Routine in and outs with follow-up chemistries 8.  Chronic diastolic congestive heart failure.  Follows with Dr. Johnsie Cancel.  Continue Entresto 24-26 mg twice daily.  Monitor for any signs of fluid overload 9.  Hypertension.Change Bidil to Imdur 120mg  daily starting tomorrow, Decrease Toprol XL to 50mg .  Continue Hydralazine to 100mg  TID starting tomorrow Monitor with increased mobility. Provided list of foods to help with blood pressure.  Discontinued order for TEDs. Increase magnesium gluconate to 500mg  HS. Monitor for effects of these changes. Discussed 4 blood pressure medications with niece. Discussed importance of magnesium and potassium in diet.  10.  Diabetes mellitus.  Hemoglobin A1c 7.1.  Increase Farxiga to 10 mg daily.  Diabetic teaching. Recommended avoiding added sugar. Provided education to avoid foods with added sugar. Reviewed CBGs [id number] and discussed with patient. Continue carb modified diet.  Much improved CBGs, d/c ISS. Freestyle Libre ordered for home- in case this is not approved, will send test strips for his glucometer.   CBG (last 3)  Recent Labs    02/07/22 1630 02/07/22 2113 02/08/22 0606  GLUCAP 147* 131* 161*   11.  Hyperlipidemia. continue Lipitor 12.  CKD stage III.  Follow-up chemistries. Cr 1.35 on most recent check.   -5/22 CR 1.48 today overall stable,  continue to monitor 13.  Medical noncompliance.  Counseling 14. Bradycardia: resolved, continue to monitor HR TID. Will maintain Toprol given HTN.      02/08/2022    5:00 AM 02/08/2022    4:45 AM 02/08/2022    4:33 AM  Vitals with BMI  Weight 166 lbs    BMI [id number]    Systolic  [id number]  Diastolic  72 73  Pulse   62    15. History of being overweight: BMI improved this hospitalization from 26.45 to 24.16. provide dietary education. Provided list of foods to assist in weight loss.  16. RLE foot drop: AFO ordered and applied. Getting newer better fitting AFO for her.  17. Vitamin D deficiency: continue ergocalciferol 50,000U once per week for 7 weeks. Discussed with patient and he is agreeable 18. Poor safety awareness: continue to provide education 19. Fatigue: discussed that Vitamin D supplement can help this. B12 is also at low end of normal, 1,000mg  injected today (5/12). Iron level checked and normal. Discussed with patient.  20. History of TBI with residual cognitive deficits: continue SLP 21. Impulsivity: improved, continue to  reinforce importance of safety precautions to avoid falls. Continue bed alarm and safety measures. Discussed with neice 22. Insomnia: continue melatonin 3mg  HS. Grounds pass ordered for sunlight exposure during the day 23. Suboptimal potassium: resolved, d/c supplement 24. Bradycardia: continue toprol XL to 50mg , discussed HR still low but improving.    >30 minutes spent in discharge of patient including review of medications and follow-up appointments, physical examination, and in answering all patient's questions    LOS: 17 days A FACE TO FACE EVALUATION WAS PERFORMED  Joseph Patrick 02/08/2022, 9:54 AM

## 2022-02-14 ENCOUNTER — Emergency Department (HOSPITAL_COMMUNITY)
Admission: EM | Admit: 2022-02-14 | Discharge: 2022-02-14 | Disposition: A | Discharge status: Home / Self Care | Payer: Medicare HMO | Attending: Emergency Medicine | Admitting: Emergency Medicine

## 2022-02-14 ENCOUNTER — Other Ambulatory Visit: Payer: Self-pay

## 2022-02-14 DIAGNOSIS — N183 Chronic kidney disease, stage 3 unspecified: Secondary | ICD-10-CM | POA: Diagnosis not present

## 2022-02-14 DIAGNOSIS — I509 Heart failure, unspecified: Secondary | ICD-10-CM | POA: Insufficient documentation

## 2022-02-14 DIAGNOSIS — I130 Hypertensive heart and chronic kidney disease with heart failure and stage 1 through stage 4 chronic kidney disease, or unspecified chronic kidney disease: Secondary | ICD-10-CM | POA: Insufficient documentation

## 2022-02-14 DIAGNOSIS — I10 Essential (primary) hypertension: Secondary | ICD-10-CM

## 2022-02-14 DIAGNOSIS — Z79899 Other long term (current) drug therapy: Secondary | ICD-10-CM | POA: Diagnosis not present

## 2022-02-14 DIAGNOSIS — Z7982 Long term (current) use of aspirin: Secondary | ICD-10-CM | POA: Insufficient documentation

## 2022-02-14 LAB — COMPREHENSIVE METABOLIC PANEL
ALT: 38 U/L (ref 0–44)
AST: 25 U/L (ref 15–41)
Albumin: 3.6 g/dL (ref 3.5–5.0)
Alkaline Phosphatase: 75 U/L (ref 38–126)
Anion gap: 4 — ABNORMAL LOW (ref 5–15)
BUN: 10 mg/dL (ref 8–23)
CO2: 27 mmol/L (ref 22–32)
Calcium: 9.3 mg/dL (ref 8.9–10.3)
Chloride: 108 mmol/L (ref 98–111)
Creatinine, Ser: 1.24 mg/dL (ref 0.61–1.24)
GFR, Estimated: >60 mL/min (ref >60)
Glucose, Bld: 176 mg/dL — ABNORMAL HIGH (ref 70–99)
Potassium: 4.2 mmol/L (ref 3.5–5.1)
Sodium: 139 mmol/L (ref 135–145)
Total Bilirubin: 0.7 mg/dL (ref 0.3–1.2)
Total Protein: 6.9 g/dL (ref 6.5–8.1)

## 2022-02-14 NOTE — ED Triage Notes (Signed)
Pt BIB EMS due to HTN. Pt had stoke 2 weeks ago and has left sided weakness from previous stroke. Pt is axox4. VSS.

## 2022-02-14 NOTE — Discharge Instructions (Signed)
Your labs today are reassuring. Please call and schedule a follow up visit with your primary care. Please call the Cardiology office to set up follow up visit as well.   Please return if you develop chest pain, shortness of breath, worsening weakness or numbness of your arms or legs, speech changes, headaches, confusion, or other new symptoms.

## 2022-02-14 NOTE — ED Provider Notes (Signed)
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 male. Presents the ED with a chief complaint of high blood pressure.  He states that he was recently admitted to the hospital for a stroke.  They said at the time that he was found to have high blood pressure and new diagnosis of heart failure and he was ultimately started on Entresto, hydralazine, and metoprolol.  He has been on these since then.   He states that he has had numerous times since he left the hospital where he felt "off".  He was unable to really describe to me what this sensation was like.  He states every time he has felt like this he is measured his blood pressure and it has been very high.  It has been up into the 220s over 110s.  He had called EMS at least 1 times since discharge and per patient, EMS told him he did not need to come to the emergency department.   He states he had a similar sensation last night and today. He measured his BP again and it was with SBP of 190s. He states that he talked to numerous medical providers including his PCP and physical therapist who all recommended that he come to the ED.   He states that he feels completely normal. He has residual right upper and lower extremity weakness that has been unchanged.  He is very concerned because his blood pressure has been so labile and he does not understand why it is still so high after starting on these blood pressure medications.    He denies any chest pain, shortness of breath, headache, generalized weakness, dizziness, visual disturbance, numbness, weakness,  confusion, gait abnormality.   Hypertension      Home Medications Prior to Admission medications   Medication Sig Start Date End Date Taking? Authorizing Provider  acetaminophen (TYLENOL) 325 MG tablet Take 2 tablets (650 mg total) by mouth every 4 (four) hours as needed for mild pain (or temp > 37.5 C (99.5 F)). 01/22/22   [name], MD  aspirin 81 MG EC tablet Take 1 tablet (81 mg total) by mouth daily. Swallow whole. 01/11/22   Masters, Katie, DO  atorvastatin (LIPITOR) 40 MG tablet Take 1 tablet (40 mg total) by mouth daily. 02/07/22   Angiulli, Lavon Paganini, PA-C  Continuous Blood Gluc Receiver (FREESTYLE LIBRE READER) DEVI 1 each by Does not apply route daily. 02/07/22   Angiulli, Lavon Paganini, PA-C  Continuous Blood Gluc Sensor (FREESTYLE LIBRE 3 SENSOR) MISC 1 each by Does not apply route daily. Place 1 sensor on the skin every 14 days. Use to check glucose continuously 02/07/22   Angiulli, Lavon Paganini, PA-C  dapagliflozin propanediol (FARXIGA) 10 MG TABS tablet Take 1 tablet (10 mg total) by mouth daily before breakfast. 02/07/22   Angiulli, Lavon Paganini, PA-C  hydrALAZINE (APRESOLINE) 100 MG tablet Take 1 tablet (100 mg total) by mouth 3 (three) times daily. 02/07/22   Angiulli, Lavon Paganini, PA-C  isosorbide mononitrate (IMDUR) 120 MG 24 hr  tablet Take 1 tablet (120 mg total) by mouth daily. 02/07/22   Angiulli, Lavon Paganini, PA-C  magnesium gluconate (MAGONATE) 500 MG tablet Take 1 tablet (500 mg total) by mouth at bedtime. 02/07/22   Angiulli, Lavon Paganini, PA-C  melatonin 3 MG TABS tablet Take 1 tablet (3 mg total) by mouth at bedtime. 02/07/22   Angiulli, Lavon Paganini, PA-C  metoprolol succinate (TOPROL-XL) 50 MG 24 hr tablet Take 1 tablet (50 mg total) by mouth daily. Take with or immediately following a meal. 02/07/22   Angiulli, Lavon Paganini, PA-C  sacubitril-valsartan (ENTRESTO) 49-51 MG Take 1 tablet by mouth 2 (two) times daily. 02/07/22 03/09/22  Angiulli, Lavon Paganini, PA-C  senna-docusate (SENOKOT-S) 8.6-50 MG tablet  Take 1 tablet by mouth at bedtime as needed for mild constipation. 01/22/22   [name], MD  Vitamin D, Ergocalciferol, (DRISDOL) 1.25 MG (50000 UNIT) CAPS capsule Take 1 capsule (50,000 Units total) by mouth every 7 (seven) days. 02/13/22   Angiulli, Lavon Paganini, PA-C      Allergies    Amlodipine    Review of Systems   Review of Systems  All other systems reviewed and are negative.  Physical Exam Updated Vital Signs BP (!) 183/115   Pulse 63   Temp 98.2 F (36.8 C) (Oral)   Resp 17   Ht 5\' 9"  (1.753 m)   Wt 78.9 kg   SpO2 97%   BMI 25.70 kg/m  Physical Exam Vitals and nursing note reviewed.  Constitutional:      General: He is not in acute distress.    Appearance: Normal appearance. He is not ill-appearing, toxic-appearing or diaphoretic.  HENT:     Head: Normocephalic and atraumatic.     Nose: No nasal deformity.     Mouth/Throat:     Lips: Pink. No lesions.     Mouth: No injury, lacerations, oral lesions or angioedema.     Pharynx: Uvula midline. No pharyngeal swelling or uvula swelling.  Eyes:     General: Gaze aligned appropriately. No scleral icterus.       Right eye: No discharge.        Left eye: No discharge.     Extraocular Movements: Extraocular movements intact.     Conjunctiva/sclera: Conjunctivae normal.     Right eye: Right conjunctiva is not injected. No exudate or hemorrhage.    Left eye: Left conjunctiva is not injected. No exudate or hemorrhage.    Pupils: Pupils are equal, round, and reactive to light.  Cardiovascular:     Rate and Rhythm: Normal rate and regular rhythm.     Pulses: Normal pulses.          Radial pulses are 2+ on the right side and 2+ on the left side.       Dorsalis pedis pulses are 2+ on the right side and 2+ on the left side.     Heart sounds: Normal heart sounds, S1 normal and S2 normal. Heart sounds not distant. No murmur heard.   No friction rub. No gallop. No S3 or S4 sounds.  Pulmonary:     Effort: Pulmonary effort is  normal. No accessory muscle usage or respiratory distress.     Breath sounds: Normal breath sounds. No stridor. No wheezing, rhonchi or rales.  Chest:     Chest wall: No tenderness.  Abdominal:     General: Abdomen is flat. Bowel sounds are normal. There is no distension.     Palpations: Abdomen is soft. There is  no mass or pulsatile mass.     Tenderness: There is no abdominal tenderness. There is no guarding or rebound.  Musculoskeletal:     Right lower leg: No edema.     Left lower leg: No edema.  Skin:    General: Skin is warm and dry.     Coloration: Skin is not jaundiced or pale.     Findings: No bruising, erythema, lesion or rash.  Neurological:     General: No focal deficit present.     Mental Status: He is alert and oriented to person, place, and time.     GCS: GCS eye subscore is 4. GCS verbal subscore is 5. GCS motor subscore is 6.     Comments: Alert and Oriented x 3 Speech clear with no aphasia Cranial Nerve testing - PERRLA. EOM intact. No Nystagmus - No facial asymmetry Motor: - RUE/RLE 4/5, LUE/LLE: 5/5 (baseline) Sensation: - Grossly intact in all four extremities.  Coordination:  - Finger to nose and heel to shin intact bilaterally    Psychiatric:        Mood and Affect: Mood normal.        Behavior: Behavior normal. Behavior is cooperative.    ED Results / Procedures / Treatments   Labs (all labs ordered are listed, but only abnormal results are displayed) Labs Reviewed  COMPREHENSIVE METABOLIC PANEL - Abnormal; Notable for the following components:      Result Value   Glucose, Bld 176 (*)    Anion gap 4 (*)    All other components within normal limits  CBC WITH DIFFERENTIAL/PLATELET  CBC WITH DIFFERENTIAL/PLATELET    EKG EKG Interpretation  Date/Time:  Wednesday Feb 14 2022 11:55:16 EDT Ventricular Rate:  59 PR Interval:  133 QRS Duration: 95 QT Interval:  410 QTC Calculation: 407 R Axis:   89 Text Interpretation: Sinus rhythm Borderline  right axis deviation Probable LVH with secondary repol abnrm T wave inversions inferior/lateral leads noted on prior tracing June 2021 Confirmed by Octaviano Glow [phone]) on 02/14/2022 12:03:56 PM  Radiology No results found.  Procedures Procedures  This patient was on telemetry or cardiac monitoring during their time in the ED.    Medications Ordered in ED Medications - No data to display  ED Course/ Medical Decision Making/ A&P                           Medical Decision Making Amount and/or Complexity of Data Reviewed Labs: ordered.    MDM  This is a 76 y.o. male who presents to the ED with elevated blood pressures The differential of this patient includes but is not limited to hypertensive emergency, stable chronic hypertension  My Impression, Plan, and ED Course:  Patient is well-appearing.  His blood pressure today is 193/83.   He reports that he is feeling completely normal and has no signs of endorgan damage.  There is no chest pain, shortness of breath, any new neurological changes, mental status changes, weakness, or any other concerning symptom I would indicate that a complication from high blood pressure is going on.  It sounds like he has had chronically elevated blood pressure that was untreated until his admission in April for his stroke.  He has been placed on several antihypertensives.  It does seem like his blood pressure has improved from prior as he was originally admitted back in April with a systolic blood pressure in the 250s.  He has not  yet followed up with his PCP or cardiologist regarding his new findings of heart failure and hypertension.  He was scheduled to see his primary care doctor today but he decided to come to the emergency department instead.   It seems like patient has a lot of anxiety over his high blood pressure and is concerned because he did just have a stroke and is now having problems with heart failure. He has been compliant with his  medications.  I have a low suspicion for end organ damage from BP. Will obtain EKG and labs. Do not think cardiac enzymes are indicated without chest pain or shortness of breath. Do not think that CT or MRI imaging of brain is indicated, as neurological deficits are unchanged.   I personally ordered, reviewed, and interpreted all laboratory work and imaging and agree with radiologist interpretation. Results interpreted below: CMP without kidney dysfunction. EKG is unchanged from baseline.    Patient is with asymptomatic hypertension. It seems to be gradually improving without intervention. No further workup or intervention is needed. I discussed with patient that he needs to follow up with PCP and cardiology for further medical management. Recommend continuing current medications.   Charting Requirements Additional history is obtained from:  Independent historian External Records from outside source obtained and reviewed including: reviewed recent CVA admission, reviewed prior EKG, Reviewed recent ECHO Social Determinants of Health:  Poor health literacy Pertinant PMH that complicates patient's illness: HTN, CHF,hx CVA  Patient Care Problems that were addressed during this visit: - Primary Hypertension: Chronic illness This patient was maintained on a cardiac monitor/telemetry. I personally viewed and interpreted the cardiac monitor which reveals an underlying rhythm of NSR Medications given in ED: n/a I have reviewed home medications and made no changes. Critical Care Interventions: n/a Consultations: n/a Disposition: discharge with close PCP and Cards f/u  This is a supervised visit with my attending physician, Dr. Langston Masker. We have discussed this patient and they have altered the plan as needed.  Portions of this note were generated with [employer]. Dictation errors may occur despite best attempts at proofreading.    Final Clinical Impression(s) / ED Diagnoses Final  diagnoses:  Primary hypertension    Rx / DC Orders ED Discharge Orders     None         Adolphus Birchwood, PA-C 02/14/22 1612    Wyvonnia Dusky, MD 02/15/22 1032

## 2022-02-15 ENCOUNTER — Other Ambulatory Visit: Payer: Self-pay

## 2022-02-15 ENCOUNTER — Encounter (HOSPITAL_COMMUNITY): Payer: Self-pay

## 2022-02-15 ENCOUNTER — Emergency Department (HOSPITAL_COMMUNITY)
Admission: EM | Admit: 2022-02-15 | Discharge: 2022-02-15 | Disposition: A | Discharge status: Home / Self Care | Payer: Medicare HMO | Attending: Emergency Medicine | Admitting: Emergency Medicine

## 2022-02-15 DIAGNOSIS — Z7984 Long term (current) use of oral hypoglycemic drugs: Secondary | ICD-10-CM | POA: Insufficient documentation

## 2022-02-15 DIAGNOSIS — E119 Type 2 diabetes mellitus without complications: Secondary | ICD-10-CM | POA: Diagnosis not present

## 2022-02-15 DIAGNOSIS — R7989 Other specified abnormal findings of blood chemistry: Secondary | ICD-10-CM | POA: Diagnosis not present

## 2022-02-15 DIAGNOSIS — Z79899 Other long term (current) drug therapy: Secondary | ICD-10-CM | POA: Diagnosis not present

## 2022-02-15 DIAGNOSIS — Z7982 Long term (current) use of aspirin: Secondary | ICD-10-CM | POA: Insufficient documentation

## 2022-02-15 DIAGNOSIS — I10 Essential (primary) hypertension: Secondary | ICD-10-CM | POA: Insufficient documentation

## 2022-02-15 LAB — CBC
HCT: 41.7 % (ref 39.0–52.0)
Hemoglobin: 13.7 g/dL (ref 13.0–17.0)
MCH: 30.7 pg (ref 26.0–34.0)
MCHC: 32.9 g/dL (ref 30.0–36.0)
MCV: 93.5 fL (ref 80.0–100.0)
Platelets: 189 10*3/uL (ref 150–400)
RBC: 4.46 MIL/uL (ref 4.22–5.81)
RDW: 12.9 % (ref 11.5–15.5)
WBC: 5.2 10*3/uL (ref 4.0–10.5)
nRBC: 0 % (ref 0.0–0.2)

## 2022-02-15 LAB — TROPONIN I (HIGH SENSITIVITY): Troponin I (High Sensitivity): 13 ng/L (ref <18)

## 2022-02-15 LAB — BASIC METABOLIC PANEL
Anion gap: 6 (ref 5–15)
BUN: 17 mg/dL (ref 8–23)
CO2: 25 mmol/L (ref 22–32)
Calcium: 9.1 mg/dL (ref 8.9–10.3)
Chloride: 107 mmol/L (ref 98–111)
Creatinine, Ser: 1.27 mg/dL — ABNORMAL HIGH (ref 0.61–1.24)
GFR, Estimated: 59 mL/min — ABNORMAL LOW (ref >60)
Glucose, Bld: 154 mg/dL — ABNORMAL HIGH (ref 70–99)
Potassium: 3.6 mmol/L (ref 3.5–5.1)
Sodium: 138 mmol/L (ref 135–145)

## 2022-02-15 MED ORDER — HYDRALAZINE HCL 25 MG PO TABS
100.0000 mg | ORAL_TABLET | Freq: Once | ORAL | Status: AC
  Administered 2022-02-15: 100 mg via ORAL
  Filled 2022-02-15: qty 4

## 2022-02-15 MED ORDER — SPIRONOLACTONE 25 MG PO TABS: 25.0000 mg | ORAL_TABLET | Freq: Every day | ORAL | 1 refills | Status: DC

## 2022-02-15 MED ORDER — SPIRONOLACTONE 25 MG PO TABS
25.0000 mg | ORAL_TABLET | Freq: Every day | ORAL | Status: DC
  Administered 2022-02-15: 25 mg via ORAL
  Filled 2022-02-15 (×2): qty 1

## 2022-02-15 NOTE — ED Provider Triage Note (Signed)
Emergency Medicine Provider Triage Evaluation Note  Joseph Patrick , a 76 y.o. male  was evaluated in triage.  Pt complains of high blood pressure.  Patient has history of recent stroke.  Patient seen yesterday in ED for same complaint of high blood pressure.  Patient states that his therapist checks his blood pressure and noted to be high.  Patient denies any chest pain, shortness of breath, headache, blurred vision, back pain.  Review of Systems  Positive:  Negative:   Physical Exam  BP (!) 200/80 (BP Location: Left Arm)   Pulse 66   Temp 98.4 F (36.9 C) (Oral)   Resp 16   Ht 5\' 9"  (1.753 m)   Wt 79.8 kg   SpO2 98%   BMI 25.99 kg/m  Gen:   Awake, no distress   Resp:  Normal effort  MSK:   Moves extremities without difficulty  Other:  Lungs clear.  No focal neurodeficits.  Medical Decision Making  Medically screening exam initiated at 12:14 PM.  Appropriate orders placed.  Joseph Patrick was informed that the remainder of the evaluation will be completed by another provider, this initial triage assessment does not replace that evaluation, and the importance of remaining in the ED until their evaluation is complete.     Azucena Cecil, PA-C 02/15/22 1215

## 2022-02-15 NOTE — ED Triage Notes (Signed)
Pt BIB [facility] from home c/o HTN. Pt had a stroke about a month ago and has had issues since then with his HTN. Pt has a therapist that comes out daily and she sent him here yesterday for HTN, came and seen him again today and his BP was high so they called EMS again.

## 2022-02-15 NOTE — Discharge Instructions (Addendum)
Take 25 mg of spironolactone in the morning with your other blood pressure medicine.  See your primary tomorrow for reevaluation.  See cardiology tomorrow or early next week to discuss blood pressure medicine management.  Return to the ED if you start having headaches, chest pain, lateralized weakness.

## 2022-02-15 NOTE — ED Provider Notes (Signed)
[facility]   CSN: [id number] Arrival date & time: 02/15/22  1137     History  Chief Complaint  Patient presents with   Hypertension    Joseph LoaDavid Patrick is a 76 y.o. male.   Hypertension   Patient with medical history of CVA, diabetes, hypertension presents today due to hypertension.  Patient was admitted about a month ago for a stroke, he was released in the hospital 1 week ago seen in the ED yesterday for hypertension as well.  Was doing physical therapy today for stroke rehab and blood pressure was too elevated so his symptoms to ED for further evaluation.  Patient takes all of his blood pressure medicine as prescribed, he has had it all this morning but did not take his lunchtime dose of hydralazine.  He denies any chest pain, headaches, vision changes, lateralized weakness, dysuria, hematuria, weakness.  He is accompanied by his niece who is at bedside.  Home Medications Prior to Admission medications   Medication Sig Start Date End Date Taking? Authorizing Provider  spironolactone (ALDACTONE) 25 MG tablet Take 1 tablet (25 mg total) by mouth daily. 02/15/22  Yes Theron AristaSage, Desarai Barrack, PA-C  acetaminophen (TYLENOL) 325 MG tablet Take 2 tablets (650 mg total) by mouth every 4 (four) hours as needed for mild pain (or temp > 37.5 C (99.5 F)). 01/22/22   Carmel SacramentoPatel, Monik, MD  aspirin 81 MG EC tablet Take 1 tablet (81 mg total) by mouth daily. Swallow whole. 01/11/22   Masters, Katie, DO  atorvastatin (LIPITOR) 40 MG tablet Take 1 tablet (40 mg total) by mouth daily. 02/07/22   Angiulli, Mcarthur Rossettianiel J, PA-C  Continuous Blood Gluc Receiver (FREESTYLE LIBRE READER) DEVI 1 each by Does not apply route daily. 02/07/22   Angiulli, Mcarthur Rossettianiel J, PA-C  Continuous Blood Gluc Sensor (FREESTYLE LIBRE 3 SENSOR) MISC 1 each by Does not apply route daily. Place 1 sensor on the skin every 14 days. Use to check glucose continuously 02/07/22   Angiulli, Mcarthur Rossettianiel J, PA-C  dapagliflozin  propanediol (FARXIGA) 10 MG TABS tablet Take 1 tablet (10 mg total) by mouth daily before breakfast. 02/07/22   Angiulli, Mcarthur Rossettianiel J, PA-C  hydrALAZINE (APRESOLINE) 100 MG tablet Take 1 tablet (100 mg total) by mouth 3 (three) times daily. 02/07/22   Angiulli, Mcarthur Rossettianiel J, PA-C  isosorbide mononitrate (IMDUR) 120 MG 24 hr tablet Take 1 tablet (120 mg total) by mouth daily. 02/07/22   Angiulli, Mcarthur Rossettianiel J, PA-C  magnesium gluconate (MAGONATE) 500 MG tablet Take 1 tablet (500 mg total) by mouth at bedtime. 02/07/22   Angiulli, Mcarthur Rossettianiel J, PA-C  melatonin 3 MG TABS tablet Take 1 tablet (3 mg total) by mouth at bedtime. 02/07/22   Angiulli, Mcarthur Rossettianiel J, PA-C  metoprolol succinate (TOPROL-XL) 50 MG 24 hr tablet Take 1 tablet (50 mg total) by mouth daily. Take with or immediately following a meal. 02/07/22   Angiulli, Mcarthur Rossettianiel J, PA-C  sacubitril-valsartan (ENTRESTO) 49-51 MG Take 1 tablet by mouth 2 (two) times daily. 02/07/22 03/09/22  Angiulli, Mcarthur Rossettianiel J, PA-C  senna-docusate (SENOKOT-S) 8.6-50 MG tablet Take 1 tablet by mouth at bedtime as needed for mild constipation. 01/22/22   Carmel SacramentoPatel, Monik, MD  Vitamin D, Ergocalciferol, (DRISDOL) 1.25 MG (50000 UNIT) CAPS capsule Take 1 capsule (50,000 Units total) by mouth every 7 (seven) days. 02/13/22   Angiulli, Mcarthur Rossettianiel J, PA-C      Allergies    Amlodipine    Review of Systems   Review of  Systems  Physical Exam Updated Vital Signs BP (!) 196/118 (BP Location: Left Arm)   Pulse 75   Temp 98.5 F (36.9 C)   Resp 18   Ht 5\' 9"  (1.753 m)   Wt 79.8 kg   SpO2 99%   BMI 25.99 kg/m  Physical Exam Vitals and nursing note reviewed. Exam conducted with a chaperone present.  Constitutional:      Appearance: Normal appearance.  HENT:     Head: Normocephalic and atraumatic.  Eyes:     General: No scleral icterus.       Right eye: No discharge.        Left eye: No discharge.     Extraocular Movements: Extraocular movements intact.     Pupils: Pupils are equal, round, and  reactive to light.  Cardiovascular:     Rate and Rhythm: Normal rate and regular rhythm.     Pulses: Normal pulses.     Heart sounds: Normal heart sounds. No murmur heard.   No friction rub. No gallop.  Pulmonary:     Effort: Pulmonary effort is normal. No respiratory distress.     Breath sounds: Normal breath sounds.  Abdominal:     General: Abdomen is flat. Bowel sounds are normal. There is no distension.     Palpations: Abdomen is soft.     Tenderness: There is no abdominal tenderness.  Skin:    General: Skin is warm and dry.     Coloration: Skin is not jaundiced.  Neurological:     Mental Status: He is alert. Mental status is at baseline.     Coordination: Coordination normal.    ED Results / Procedures / Treatments   Labs (all labs ordered are listed, but only abnormal results are displayed) Labs Reviewed  BASIC METABOLIC PANEL - Abnormal; Notable for the following components:      Result Value   Glucose, Bld 154 (*)    Creatinine, Ser 1.27 (*)    GFR, Estimated 59 (*)    All other components within normal limits  CBC  TROPONIN I (HIGH SENSITIVITY)    EKG None  Radiology No results found.  Procedures Procedures    Medications Ordered in ED Medications  hydrALAZINE (APRESOLINE) tablet 100 mg (100 mg Oral Given 02/15/22 1543)    ED Course/ Medical Decision Making/ A&P                           Medical Decision Making Risk Prescription drug management.   This patient presents to the ED for concern of hypertension, this involves an extensive number of treatment options, and is a complaint that carries with it a high risk of complications and morbidity.  The differential diagnosis includes hypertensive urgency, evidence of emergency, ACS, AKI, TIA/CVA   Additional history obtained:   Independent historian: NIECE Reviewed external records including patient's medication list.  Saw his echocardiogram from 01/07/2022 which shows ejection fraction between 25 to  35%.    Lab Tests:  I ordered, viewed, and personally interpreted labs.  The pertinent results include: No leukocytosis or anemia.  Troponin unremarkable.  Especially the context of no chest pain.  Patient's creatinine slightly elevated at 1.27, mildly increased from yesterday at 1.23.    ECG/Cardiac monitoring:   Per my interpretation, EKG shows sinus rhythm with some LVH.  T wave inversions in the inferior leads and V5 and V6.  No reciprocal ST elevations, no underlying arrhythmia   Medicines  ordered and prescription drug management:  I ordered medication including: Hydralazine, spironolactone  I have reviewed the patients home medicines and have made adjustments as needed   Test Considered:  Considered admission but blood pressure improved and no signs of endorgan damage noted be concerning for hypertensive emergency.    Consultations Obtained:  I requested consultation with the on-call cardiologist.  Discussed lab and imaging findings as well as pertinent plan - they recommend: Dr. Evert Kohl recommends adding spironolactone.  He also reviewed external records, most recent cardiology note recommends that addition of pressure continues to be an manage.  I agree with this recommendation discussed with patient, spironolactone ordered.   Reevaluation:  After the interventions noted above, I reevaluated the patient and found no new symptoms, blood pressure improved.   Problems addressed / ED Course: Patient presents due to hypertension.  He was significantly hypertensive initially on intake but improved to the ED.  I ordered hydralazine and spironolactone I speak with cardiology, will add to his med list.  Patient discharged in stable condition, return precautions discussed.   Social Determinants of Health: As cardiology and primary care establish outpatient   Disposition:   After consideration of the diagnostic results and the patients response to treatment, I feel that  the patent would benefit from close outpatient F/U.            Final Clinical Impression(s) / ED Diagnoses Final diagnoses:  Primary hypertension    Rx / DC Orders ED Discharge Orders          Ordered    spironolactone (ALDACTONE) 25 MG tablet  Daily        02/15/22 1555              Theron Arista, PA-C 02/15/22 2324    Gwyneth Sprout, MD 02/22/22 1639

## 2022-02-16 ENCOUNTER — Ambulatory Visit: Payer: Medicare HMO | Admitting: Physician Assistant

## 2022-02-19 ENCOUNTER — Telehealth: Payer: Self-pay | Admitting: *Deleted

## 2022-02-19 NOTE — Progress Notes (Unsigned)
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 male with the above problem list.  He was admitted 4/22-4/26 with an acute L frontoparietal stroke secondary to small vessel disease.  His blood pressure was markedly elevated (260/120s) upon presentation.  Echocardiogram demonstrated reduced EF at 25-30 with coarse apical trabeculation and no obvious thrombus.  There was also moderate mitral regurgitation.  He was seen by Dr. Johnsie Cancel for cardiology.  It was thought that his cardiomyopathy may be due to untreated hypertension.  He was started on GDMT with nitrates/hydralazine, Entresto, metoprolol tartrate, spironolactone and Farxiga.  Plan is to repeat echocardiogram versus MRI in 3 months +/- ischemic work-up.      He was readmitted 4/30-5/8 with a stuttering left lacunar infarct (blood pressures 190s/90s upon presentation).  He did undergo P2Y12 testing and was deemed to be a Plavix responder.  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  He was seen by neurology and dual antiplatelet therapy was continued.  He was discharged inpatient rehab.  Since discharge, the patient has been to the emergency room twice on 5/31  and 6/1 with uncontrolled blood pressure.  On 6/1, he was placed on spironolactone.  He returns for follow-up.  He is here today with his niece.  He has been doing well without chest pain, shortness of breath, syncope, orthopnea.  His right side remains weak.  He has an AFO on the right.  He has had some mild edema in his right leg.  He continues to work with physical therapy.  Blood pressures at home remain elevated.    Past Medical History:  Diagnosis Date   CVA (cerebral vascular accident) [facility])    april 2023   Diabetes mellitus without complication ([place])    Hypertension    Current Medications: Current Meds  Medication Sig   aspirin EC 81 MG tablet Take 1 tablet (81 mg total) by mouth daily. Swallow whole.   atorvastatin (LIPITOR) 40 MG tablet Take 1 tablet (40 mg total) by mouth daily.   carvedilol (COREG) 12.5 MG tablet Take 1 tablet (12.5 mg total) by mouth 2 (two) times daily.   Continuous Blood Gluc Receiver (FREESTYLE LIBRE READER) DEVI 1 each by Does not apply route daily.   Continuous Blood Gluc Sensor (FREESTYLE LIBRE 3 SENSOR) MISC 1 each by Does not apply route daily. Place 1 sensor on the skin every 14 days. Use to check glucose continuously   dapagliflozin propanediol (FARXIGA) 10 MG TABS tablet Take 1 tablet (10 mg total) by mouth daily before breakfast.   hydrALAZINE (APRESOLINE) 100 MG tablet Take 1 tablet (100 mg total) by mouth 3 (three) times daily.   isosorbide mononitrate (IMDUR)  120 MG 24 hr tablet Take 1 tablet (120 mg total) by mouth daily.   sacubitril-valsartan (ENTRESTO) 49-51 MG Take 1 tablet by mouth 2 (two) times daily.   spironolactone (ALDACTONE) 25 MG tablet Take 1 tablet (25 mg total) by mouth daily.   Vitamin D, Ergocalciferol, (DRISDOL) 1.25 MG (50000 UNIT) CAPS capsule Take 1 capsule (50,000 Units total) by mouth every 7 (seven) days.   [DISCONTINUED] metoprolol succinate (TOPROL-XL) 50 MG 24 hr tablet Take 1 tablet (50 mg total) by mouth daily. Take  with or immediately following a meal.    Allergies:   Amlodipine   Social History   Tobacco Use   Smoking status: Never   Smokeless tobacco: Never  Vaping Use   Vaping Use: Never used  Substance Use Topics   Alcohol use: Never   Drug use: Never    Family Hx: The patient's family history includes Heart failure in his sister.  Review of Systems  Gastrointestinal:  Negative for hematochezia.  Genitourinary:  Negative for hematuria.  Neurological:  Negative for headaches.    EKGs/Labs/Other Test Reviewed:    EKG:  EKG is not ordered today.  The ekg ordered today demonstrates /a  Recent Labs: 01/15/2022: TSH 0.854 02/01/2022: Magnesium 2.3 02/14/2022: ALT 38 02/15/2022: BUN 17; Creatinine, Ser 1.27; Hemoglobin 13.7; Platelets 189; Potassium 3.6; Sodium 138   Recent Lipid Panel Recent Labs    01/07/22 0551  CHOL 186  TRIG 82  HDL 43  VLDL 16  LDLCALC 127*     Risk Assessment/Calculations/Metrics:         HYPERTENSION CONTROL Vitals:   02/20/22 1122 02/20/22 1237  BP: (!) 170/80 (!) 210/90    The patient's blood pressure is elevated above target today.  The following intervention was performed to address the patient's elevated BP:  - A new medication was prescribed today.       Physical Exam:    VS:  BP (!) 210/90   Pulse 70   Ht 5\' 9"  (1.753 m)   Wt 167 lb 9.6 oz (76 kg)   SpO2 97%   BMI 24.75 kg/m     Wt Readings from Last 3 Encounters:  02/20/22 167 lb 9.6 oz (76 kg)  02/15/22 176 lb (79.8 kg)  02/14/22 174 lb (78.9 kg)    Constitutional:      Appearance: Healthy appearance. Not in distress.  Neck:     Vascular: No JVR. JVD normal.  Pulmonary:     Effort: Pulmonary effort is normal.     Breath sounds: No wheezing. No rales.  Cardiovascular:     Normal rate. Regular rhythm. Normal S1. Normal S2.      Murmurs: There is no murmur.  Edema:    Peripheral edema absent.  Abdominal:     Palpations: Abdomen is soft.  Skin:    General: Skin is warm  and dry.  Neurological:     General: No focal deficit present.     Mental Status: Alert and oriented to person, place and time.     Cranial Nerves: Cranial nerves are intact.         ASSESSMENT & PLAN:   Hypertension Blood pressure remains uncontrolled.  He is on maximum dose hydralazine and isosorbide as well as a good dose of Entresto and spironolactone.  TSH has been normal.  Creatinine has remained stable.  He was just placed on spironolactone in the emergency room last week.   Discontinue metoprolol succinate Start carvedilol 12.5 mg twice  daily Continue hydralazine 100 mg 3 times a day, isosorbide mononitrate 120 mg daily, Entresto 49/51 mg twice daily, spironolactone 25 mg daily Patient will send blood pressure readings in 1 week; Plan to increase Entresto to 97/103 mg twice daily if BP remains above target BMET today Arrange renal arterial Dopplers to rule out renal artery stenosis Consider work-up for hyperaldosteronism Follow-up 3 weeks  HFrEF (heart failure with reduced ejection fraction) (HCC) EF 25-30.  NYHA I-II.  Volume status stable without diuretic therapy.  We discussed the "4 pillars" of heart failure management.  Continue dapagliflozin 10 mg daily, hydralazine 100 mg 3 times a day, isosorbide mononitrate 120 mg daily, Entresto 49/51 mg twice daily, spironolactone 25 mg daily.  Obtain BMET today.  Discontinue Toprol XL and start carvedilol 12.5 mg twice daily.  As noted, patient will send blood pressure readings in 1 week and I will adjust his Entresto to 97/103 mg twice daily (as long as his blood pressure remains above target).  Follow-up in 3 weeks.  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  Plan will be to eventually obtain an echocardiogram versus cardiac MRI in July or later if blood pressure still uncontrolled.  I will arrange a cardiac PET to rule out ischemic heart disease.  Stage 3a chronic  kidney disease (CKD) ([place]) Obtain follow-up BMET today.  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  I reviewed the notes.  Plan was to continue aspirin and Plavix for 3 weeks then aspirin alone.  I have asked him to resume aspirin 81 mg daily.  He continues to work with physical therapy.  He is wearing an AFO on the right.  He does have some swelling on the right.  I encouraged him to use compression hose.        Shared Decision Making/Informed Consent The risks [chest pain, shortness of breath, cardiac arrhythmias, dizziness, blood pressure fluctuations, myocardial infarction, stroke/transient ischemic attack, nausea, vomiting, allergic reaction, radiation exposure, metallic taste sensation and life-threatening complications (estimated to be 1 in 10,000)], benefits (risk stratification, diagnosing coronary artery disease, treatment guidance) and alternatives of a cardiac PET stress test were discussed in detail with Mr. Opalinski and he agrees to proceed.   Dispo:  Return in about 3 weeks (around 03/13/2022) for Routine Follow Up w/ Dr. Johnsie Cancel, or Richardson Dopp, PA-C.   Medication Adjustments/Labs and Tests Ordered: Current medicines are reviewed at length with the patient today.  Concerns regarding medicines are outlined above.  Tests Ordered: Orders Placed This Encounter  Procedures   NM PET CT CARDIAC PERFUSION MULTI W/ABSOLUTE BLOODFLOW   Basic metabolic panel   Cardiac Stress Test: Informed Consent Details: Physician/Practitioner Attestation; Transcribe to consent form and obtain patient signature   VAS US RENAL ARTERY DUPLEX   Medication Changes: Meds ordered this encounter  Medications   carvedilol (COREG) 12.5 MG tablet    Sig: Take 1 tablet (12.5 mg total) by mouth 2 (two) times daily.    Dispense:  180 tablet    Refill:  3   aspirin EC 81 MG tablet    Sig: Take 1 tablet (81 mg total) by mouth daily. Swallow whole.    Dispense:  90 tablet    Refill:  3    Signed, Richardson Dopp, PA-C  02/20/2022 12:39 PM    [facility] [address], [place], [place]  [address] Phone: [phone]; Fax: [phone]

## 2022-02-19 NOTE — Telephone Encounter (Signed)
Don OT with [facility] called for POC 1wk3.  Approval given.

## 2022-02-20 ENCOUNTER — Ambulatory Visit: Payer: Medicare HMO | Admitting: Physician Assistant

## 2022-02-20 ENCOUNTER — Encounter: Payer: Self-pay | Admitting: Physician Assistant

## 2022-02-20 VITALS — BP 210/90 | HR 70 | Ht 69.0 in | Wt 167.6 lb

## 2022-02-20 DIAGNOSIS — I10 Essential (primary) hypertension: Secondary | ICD-10-CM

## 2022-02-20 DIAGNOSIS — I420 Dilated cardiomyopathy: Secondary | ICD-10-CM | POA: Diagnosis not present

## 2022-02-20 DIAGNOSIS — I5020 Unspecified systolic (congestive) heart failure: Secondary | ICD-10-CM | POA: Diagnosis not present

## 2022-02-20 DIAGNOSIS — N1831 Chronic kidney disease, stage 3a: Secondary | ICD-10-CM | POA: Diagnosis not present

## 2022-02-20 DIAGNOSIS — I69359 Hemiplegia and hemiparesis following cerebral infarction affecting unspecified side: Secondary | ICD-10-CM

## 2022-02-20 LAB — BASIC METABOLIC PANEL
BUN/Creatinine Ratio: 9 — ABNORMAL LOW (ref 10–24)
BUN: 12 mg/dL (ref 8–27)
CO2: 26 mmol/L (ref 20–29)
Calcium: 9.6 mg/dL (ref 8.6–10.2)
Chloride: 101 mmol/L (ref 96–106)
Creatinine, Ser: 1.29 mg/dL — ABNORMAL HIGH (ref 0.76–1.27)
Glucose: 161 mg/dL — ABNORMAL HIGH (ref 70–99)
Potassium: 4.2 mmol/L (ref 3.5–5.2)
Sodium: 139 mmol/L (ref 134–144)
eGFR: 57 mL/min/{1.73_m2} — ABNORMAL LOW (ref >59)

## 2022-02-20 MED ORDER — CARVEDILOL 12.5 MG PO TABS: 12.5000 mg | ORAL_TABLET | Freq: Two times a day (BID) | ORAL | 3 refills | Status: DC

## 2022-02-20 MED ORDER — ASPIRIN 81 MG PO TBEC: 81.0000 mg | DELAYED_RELEASE_TABLET | Freq: Every day | ORAL | 3 refills | Status: AC

## 2022-02-20 NOTE — Assessment & Plan Note (Signed)
EF 25-30.  NYHA I-II.  Volume status stable without diuretic therapy.  We discussed the "4 pillars" of heart failure management.  Continue dapagliflozin 10 mg daily, hydralazine 100 mg 3 times a day, isosorbide mononitrate 120 mg daily, Entresto 49/51 mg twice daily, spironolactone 25 mg daily.  Obtain BMET today.  Discontinue Toprol XL and start carvedilol 12.5 mg twice daily.  As noted, patient will send blood pressure readings in 1 week and I will adjust his Entresto to 97/103 mg twice daily (as long as his blood pressure remains above target).  Follow-up in 3 weeks.

## 2022-02-20 NOTE — Assessment & Plan Note (Signed)
Blood pressure remains uncontrolled.  He is on maximum dose hydralazine and isosorbide as well as a good dose of Entresto and spironolactone.  TSH has been normal.  Creatinine has remained stable.  He was just placed on spironolactone in the emergency room last week.    Discontinue metoprolol succinate  Start carvedilol 12.5 mg twice daily  Continue hydralazine 100 mg 3 times a day, isosorbide mononitrate 120 mg daily, Entresto 49/51 mg twice daily, spironolactone 25 mg daily  Patient will send blood pressure readings in 1 week; Plan to increase Entresto to 97/103 mg twice daily if BP remains above target  BMET today  Arrange renal arterial Dopplers to rule out renal artery stenosis  Consider work-up for hyperaldosteronism  Follow-up 3 weeks

## 2022-02-20 NOTE — Assessment & Plan Note (Signed)
He stopped taking aspirin.  I reviewed the notes.  Plan was to continue aspirin and Plavix for 3 weeks then aspirin alone.  I have asked him to resume aspirin 81 mg daily.  He continues to work with physical therapy.  He is wearing an AFO on the right.  He does have some swelling on the right.  I encouraged him to use compression hose.

## 2022-02-20 NOTE — Assessment & Plan Note (Signed)
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  Plan will be to eventually obtain an echocardiogram versus cardiac MRI in July or later if blood pressure still uncontrolled.  I will arrange a cardiac PET to rule out ischemic heart disease.

## 2022-02-20 NOTE — Assessment & Plan Note (Signed)
Obtain follow-up BMET today. 

## 2022-02-20 NOTE — Patient Instructions (Addendum)
Medication Instructions:  Your physician has recommended you make the following change in your medication:   RESTART Aspirin 81 mg daily  STOP Toprol  START Carvedilol 12.5 taking 1 twice a day  *If you need a refill on your cardiac medications before your next appointment, please call your pharmacy*   Lab Work: None ordered  If you have labs (blood work) drawn today and your tests are completely normal, you will receive your results only by: [place] (if you have MyChart) OR A paper copy in the mail If you have any lab test that is abnormal or we need to change your treatment, we will call you to review the results.   Testing/Procedures: Your physician has requested that you have a renal artery duplex. During this test, an ultrasound is used to evaluate blood flow to the kidneys. Allow one hour for this exam. Do not eat after midnight the day before and avoid carbonated beverages. Take your medications as you usually do.  Your physician recommends that you have a Cardiac PET / CT STRESS TEST. See instructions below:  How to Prepare for Your Cardiac PET/CT Stress Test:  1. Please do not take these medications before your test:    (Erectile dysfunction medication should be held for at least 72 hrs prior to test) Theophylline containing medications for 12 hours. Dipyridamole 48 hours prior to the test. Your remaining medications may be taken with water.  2. Nothing to eat or drink, except water, 3 hours prior to arrival time.   NO caffeine/decaffeinated products, or chocolate 12 hours prior to arrival.  3. NO perfume, cologne or lotion  4. Total time is 1 to 2 hours; you may want to bring reading material for the waiting time.  5. Please report to Admitting at the [facility] Main Entrance 60 minutes early for your test.  [address], [place] [address]  Diabetic Preparation:  Hold oral medications. You may take NPH and Lantus insulin. Do not  take Humalog or Humulin R (Regular Insulin) the day of your test. Check blood sugars prior to leaving the house. If able to eat breakfast prior to 3 hour fasting, you may take all medications, including your insulin, Do not worry if you miss your breakfast dose of insulin - start at your next meal.  In preparation for your appointment, medication and supplies will be purchased.  Appointment availability is limited, so if you need to cancel or reschedule, please call the Radiology Department at [phone]  24 hours in advance to avoid a cancellation fee of $100.00  What to Expect After you Arrive:  Once you arrive and check in for your appointment, you will be taken to a preparation room within the Radiology Department.  A technologist or Nurse will obtain your medical history, verify that you are correctly prepped for the exam, and explain the procedure.  Afterwards,  an IV will be started in your arm and electrodes will be placed on your skin for EKG monitoring during the stress portion of the exam. Then you will be escorted to the PET/CT scanner.  There, staff will get you positioned on the scanner and obtain a blood pressure and EKG.  During the exam, you will continue to be connected to the EKG and blood pressure machines.  A small, safe amount of a radioactive tracer will be injected in your IV to obtain a series of pictures of your heart along with an injection of a stress agent.  After your Exam:  It is recommended that you eat a meal and drink a caffeinated beverage to counter act any effects of the stress agent.  Drink plenty of fluids for the remainder of the day and urinate frequently for the first couple of hours after the exam.  Your doctor will inform you of your test results within 7-10 business days.  For questions about your test or how to prepare for your test, please call: Marchia Bond, Cardiac Imaging Nurse Navigator  Gordy Clement, Cardiac Imaging Nurse Navigator Office:  [phone]    Follow-Up: At [facility], you and your health needs are our priority.  As part of our continuing mission to provide you with exceptional heart care, we have created designated Provider Care Teams.  These Care Teams include your primary Cardiologist (physician) and Advanced Practice Providers (APPs -  Physician Assistants and Nurse Practitioners) who all work together to provide you with the care you need, when you need it.  We recommend signing up for the patient portal called "MyChart".  Sign up information is provided on this After Visit Summary.  MyChart is used to connect with patients for Virtual Visits (Telemedicine).  Patients are able to view lab/test results, encounter notes, upcoming appointments, etc.  Non-urgent messages can be sent to your provider as well.   To learn more about what you can do with MyChart, go to [email or web address].    Your next appointment:   3 week(s)  The format for your next appointment:   In Person  Provider:   Jenkins Rouge, MD  or Richardson Dopp, PA-C         Other Instructions Your physician has requested that you regularly monitor and record your blood pressure readings at home. Please use the same machine at the same time of day to check your readings and record them to bring to your follow-up visit.   Please monitor blood pressures and keep a log of your readings and in 1 week call us with those readings [phone].    Make sure to check 2 hours after your medications.    AVOID these things for 30 minutes before checking your blood pressure: No Drinking caffeine. No Drinking alcohol. No Eating. No Smoking. No Exercising.   Five minutes before checking your blood pressure: Pee. Sit in a dining chair. Avoid sitting in a soft couch or armchair. Be quiet. Do not talk   Important Information About Sugar

## 2022-02-22 ENCOUNTER — Encounter: Payer: Medicare HMO | Attending: Physical Medicine & Rehabilitation | Admitting: Physical Medicine & Rehabilitation

## 2022-03-07 ENCOUNTER — Telehealth: Payer: Self-pay | Admitting: Physician Assistant

## 2022-03-07 ENCOUNTER — Ambulatory Visit (HOSPITAL_COMMUNITY)
Admission: RE | Admit: 2022-03-07 | Discharge: 2022-03-07 | Disposition: A | Discharge status: Home / Self Care | Payer: Medicare HMO | Source: Ambulatory Visit | Attending: Cardiovascular Disease | Admitting: Cardiovascular Disease

## 2022-03-07 DIAGNOSIS — I420 Dilated cardiomyopathy: Secondary | ICD-10-CM | POA: Insufficient documentation

## 2022-03-07 DIAGNOSIS — I5020 Unspecified systolic (congestive) heart failure: Secondary | ICD-10-CM | POA: Diagnosis not present

## 2022-03-07 DIAGNOSIS — I10 Essential (primary) hypertension: Secondary | ICD-10-CM | POA: Diagnosis not present

## 2022-03-07 MED ORDER — ATORVASTATIN CALCIUM 40 MG PO TABS: 40.0000 mg | ORAL_TABLET | Freq: Every day | ORAL | 1 refills | Status: DC

## 2022-03-07 MED ORDER — ISOSORBIDE MONONITRATE ER 120 MG PO TB24: 120.0000 mg | ORAL_TABLET | Freq: Every day | ORAL | 1 refills | Status: DC

## 2022-03-07 MED ORDER — SPIRONOLACTONE 25 MG PO TABS: 25.0000 mg | ORAL_TABLET | Freq: Every day | ORAL | 1 refills | Status: DC

## 2022-03-07 MED ORDER — CARVEDILOL 12.5 MG PO TABS: 12.5000 mg | ORAL_TABLET | Freq: Two times a day (BID) | ORAL | 1 refills | Status: DC

## 2022-03-07 MED ORDER — ENTRESTO 97-103 MG PO TABS: 1.0000 | ORAL_TABLET | Freq: Two times a day (BID) | ORAL | 1 refills | Status: DC

## 2022-03-07 MED ORDER — HYDRALAZINE HCL 100 MG PO TABS: 100.0000 mg | ORAL_TABLET | Freq: Three times a day (TID) | ORAL | 1 refills | Status: DC

## 2022-03-07 NOTE — Telephone Encounter (Signed)
Patient's niece [name] called for test results.

## 2022-03-07 NOTE — Telephone Encounter (Signed)
BP improved but not to goal. Continue current medications. Increase Entresto to 97/103 mg twice daily. He can take 2 of the 49/51 mg tablets twice daily until he gets the new Rx. BMET at f/u OV with 03/16/22. Tereso Newcomer, PA-C    03/07/2022 3:27 PM

## 2022-03-08 NOTE — Telephone Encounter (Signed)
Returned call and send message via mychart.

## 2022-03-15 NOTE — Progress Notes (Addendum)
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 male with the above problem list.  He was last seen 02/20/2022.  I changed his metoprolol to carvedilol.  After his visit, he collected blood pressure readings and send them in for review.  I adjusted his Entresto further.  Renal arterial ultrasound was obtained and demonstrated mild to moderate plaque in the left renal artery but no significant stenosis.  Cardiac PET has been ordered and is currently pending to rule out ischemic heart disease.  Plan is to eventually repeat echo versus cardiac MRI to reassess LV function.  He returns for follow-up.   He is here with his wife.  He continues to do well without chest pain, shortness of breath, syncope, orthopnea, leg edema.  He still has right-sided weakness.  This is improving but he is concerned about his hand weakness    Past Medical History:  Diagnosis Date   CVA (cerebral  vascular accident) [facility])    april 2023   Diabetes mellitus without complication (HCC)    Hypertension    Current Medications: Current Meds  Medication Sig   aspirin EC 81 MG tablet Take 1 tablet (81 mg total) by mouth daily. Swallow whole.   atorvastatin (LIPITOR) 40 MG tablet Take 1 tablet (40 mg total) by mouth daily.   carvedilol (COREG) 12.5 MG tablet Take 1.5 tablets (18.75 mg total) by mouth 2 (two) times daily.   Continuous Blood Gluc Receiver (FREESTYLE LIBRE READER) DEVI 1 each by Does not apply route daily.   Continuous Blood Gluc Sensor (FREESTYLE LIBRE 3 SENSOR) MISC 1 each by Does not apply route daily. Place 1 sensor on the skin every 14 days. Use to check glucose continuously   dapagliflozin propanediol (FARXIGA) 10 MG TABS tablet Take 1 tablet (10 mg total) by mouth daily before breakfast.   hydrALAZINE (APRESOLINE) 100 MG tablet Take 1 tablet (100 mg total) by mouth 3 (three) times daily.   isosorbide mononitrate (IMDUR) 120 MG 24 hr tablet Take 1 tablet (120 mg total) by mouth daily.   sacubitril-valsartan (ENTRESTO) 97-103 MG Take 1 tablet by mouth 2 (two) times daily.   spironolactone (ALDACTONE) 25 MG tablet Take 1 tablet (25 mg total) by mouth daily.   Vitamin D, Ergocalciferol, (DRISDOL) 1.25 MG (50000 UNIT) CAPS capsule Take 1 capsule (50,000 Units total) by mouth every 7 (seven) days.   [DISCONTINUED] carvedilol (COREG) 12.5 MG tablet Take 1 tablet (12.5 mg total)  by mouth 2 (two) times daily.    Allergies:   Amlodipine   Social History   Tobacco Use   Smoking status: Never   Smokeless tobacco: Never  Vaping Use   Vaping Use: Never used  Substance Use Topics   Alcohol use: Never   Drug use: Never    Family Hx: The patient's family history includes Heart failure in his sister.  Review of Systems  Gastrointestinal:  Negative for hematochezia.  Genitourinary:  Negative for hematuria.     EKGs/Labs/Other Test Reviewed:    EKG:  EKG is not ordered  today.  The ekg ordered today demonstrates n/a  Recent Labs: 01/15/2022: TSH 0.854 02/01/2022: Magnesium 2.3 02/14/2022: ALT 38 02/15/2022: Hemoglobin 13.7; Platelets 189 02/20/2022: BUN 12; Creatinine, Ser 1.29; Potassium 4.2; Sodium 139   Recent Lipid Panel Recent Labs    01/07/22 0551  CHOL 186  TRIG 82  HDL 43  VLDL 16  LDLCALC 127*     Risk Assessment/Calculations/Metrics:         HYPERTENSION CONTROL Vitals:   03/16/22 0949 03/16/22 1055  BP: (!) 160/62 (!) 160/80    The patient's blood pressure is elevated above target today.  The following intervention was performed to address the patient's elevated BP:  - A current anti-hypertensive medication was adjusted today.       Physical Exam:    VS:  BP (!) 160/80   Pulse 68   Ht 5\' 9"  (1.753 m)   Wt 169 lb 12.8 oz (77 kg)   SpO2 95%   BMI 25.08 kg/m     Wt Readings from Last 3 Encounters:  03/16/22 169 lb 12.8 oz (77 kg)  02/20/22 167 lb 9.6 oz (76 kg)  02/15/22 176 lb (79.8 kg)    Constitutional:      Appearance: Healthy appearance. Not in distress.  Neck:     Vascular: No JVR. JVD normal.  Pulmonary:     Effort: Pulmonary effort is normal.     Breath sounds: No wheezing. No rales.  Cardiovascular:     Normal rate. Regular rhythm. Normal S1. Normal S2.      Murmurs: There is no murmur.  Edema:    Peripheral edema absent.  Abdominal:     Palpations: Abdomen is soft.  Skin:    General: Skin is warm and dry.  Neurological:     General: No focal deficit present.     Mental Status: Alert and oriented to person, place and time.     Comments: Right AFO in place         ASSESSMENT & PLAN:   Essential hypertension His blood pressure remains uncontrolled.  It is better than it has been previously.  He does have a history of allergy to amlodipine.  He is on max dose hydralazine, isosorbide mononitrate, Entresto.  I do not think we should increase his spironolactone any further.  His heart rate probably could  tolerate an increase in his carvedilol.  I did review his case with Dr. Oval Linsey.  He would have to be off of Entresto and spironolactone for several days to get renin and aldosterone activity measured.  We could do a CT or MRI of his abdomen to rule out adrenal hyperplasia/adenoma.  Recent renal Dopplers were negative for significant renal artery stenosis. Increase carvedilol to 18.75 mg twice daily Continue hydralazine 10 mg 3 times a day, isosorbide mononitrate 120 mg daily, Entresto 97/103 mg twice daily, spironolactone 25 mg daily BMET today Follow-up  Pharm.D. hypertension clinic in 3 to 4 weeks.  Consider addition of doxazosin if BP remains above goal.  HFrEF (heart failure with reduced ejection fraction) (HCC) NYHA I-II.  EF 25-30.  Volume status currently stable.  Adjust carvedilol to 18.7 mg twice daily as noted.  Continue Farxiga 10 mg daily, hydralazine 100 mg 3 times a day, isosorbide mononitrate 120 mg daily, Entresto 97/103 mg twice daily, spironolactone 25 mg daily.  Dilated cardiomyopathy (HCC) Cardiomyopathy likely related to uncontrolled hypertension.  At last visit, we discussed proceeding with stress testing.  I think a follow-up cardiac MRI would be adequate to reassess LV function and rule out scar.  Therefore, we will hold off on stress testing for now.  Follow-up with Dr. Eden Emms or me in 2 to 3 months.  If his blood pressure is better controlled at that time, we will arrange cardiac MRI.  Stage 3a chronic kidney disease (CKD) (HCC) Repeat BMET today.  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  He is still bothered by his hand weakness.  He sees neurology in August.  I have reminded him to continue his home exercises and discuss his weakness with neurology at his follow-up visit           Dispo:  Return in about 3 months (around 06/16/2022) for Routine Follow Up with Dr. Eden Emms, or Tereso Newcomer, PA-C.   Medication  Adjustments/Labs and Tests Ordered: Current medicines are reviewed at length with the patient today.  Concerns regarding medicines are outlined above.  Tests Ordered: Orders Placed This Encounter  Procedures   Basic metabolic panel   AMB Referral to [facility] Pharm-D   Medication Changes: Meds ordered this encounter  Medications   carvedilol (COREG) 12.5 MG tablet    Sig: Take 1.5 tablets (18.75 mg total) by mouth 2 (two) times daily.    Dispense:  270 tablet    Refill:  3   Signed, Tereso Newcomer, PA-C  03/16/2022 11:06 AM    [facility] [address] [place], [place], [place]  [address] Phone: [phone]; Fax: [phone]

## 2022-03-16 ENCOUNTER — Ambulatory Visit: Payer: Medicare HMO | Admitting: Physician Assistant

## 2022-03-16 ENCOUNTER — Encounter: Payer: Self-pay | Admitting: Physician Assistant

## 2022-03-16 VITALS — BP 160/80 | HR 68 | Ht 69.0 in | Wt 169.8 lb

## 2022-03-16 DIAGNOSIS — I5020 Unspecified systolic (congestive) heart failure: Secondary | ICD-10-CM

## 2022-03-16 DIAGNOSIS — N1831 Chronic kidney disease, stage 3a: Secondary | ICD-10-CM | POA: Diagnosis not present

## 2022-03-16 DIAGNOSIS — I420 Dilated cardiomyopathy: Secondary | ICD-10-CM | POA: Diagnosis not present

## 2022-03-16 DIAGNOSIS — I69359 Hemiplegia and hemiparesis following cerebral infarction affecting unspecified side: Secondary | ICD-10-CM

## 2022-03-16 DIAGNOSIS — I10 Essential (primary) hypertension: Secondary | ICD-10-CM

## 2022-03-16 MED ORDER — CARVEDILOL 12.5 MG PO TABS: 18.7500 mg | ORAL_TABLET | Freq: Two times a day (BID) | ORAL | 3 refills | Status: DC

## 2022-03-16 NOTE — Patient Instructions (Signed)
Medication Instructions:  Your physician has recommended you make the following change in your medication:   INCREASE the Carvedilol to 12.5 mg taking 1 1/2 tablet twice a day   *If you need a refill on your cardiac medications before your next appointment, please call your pharmacy*   Lab Work: TODAY:  BMET   If you have labs (blood work) drawn today and your tests are completely normal, you will receive your results only by: MyChart Message (if you have MyChart) OR A paper copy in the mail If you have any lab test that is abnormal or we need to change your treatment, we will call you to review the results.   Testing/Procedures: None ordered  You have been referred to Pharmacist in the Hypertension clinic in 3-4 weeks.   PLEASE BRING YOUR BLOOD PRESSURE MONITOR AND READINGS WITH YOU TO THIS APPT     Follow-Up: At [facility], you and your health needs are our priority.  As part of our continuing mission to provide you with exceptional heart care, we have created designated Provider Care Teams.  These Care Teams include your primary Cardiologist (physician) and Advanced Practice Providers (APPs -  Physician Assistants and Nurse Practitioners) who all work together to provide you with the care you need, when you need it.  We recommend signing up for the patient portal called "MyChart".  Sign up information is provided on this After Visit Summary.  MyChart is used to connect with patients for Virtual Visits (Telemedicine).  Patients are able to view lab/test results, encounter notes, upcoming appointments, etc.  Non-urgent messages can be sent to your provider as well.   To learn more about what you can do with MyChart, go to [email or web address].    Your next appointment:   2-3 month(s)  The format for your next appointment:   In Person  Provider:   Charlton Haws, MD  or Tereso Newcomer, PA-C         Other Instructions   Important Information About Sugar

## 2022-03-16 NOTE — Assessment & Plan Note (Signed)
NYHA I-II.  EF 25-30.  Volume status currently stable.  Adjust carvedilol to 18.7 mg twice daily as noted.  Continue Farxiga 10 mg daily, hydralazine 100 mg 3 times a day, isosorbide mononitrate 120 mg daily, Entresto 97/103 mg twice daily, spironolactone 25 mg daily.

## 2022-03-16 NOTE — Assessment & Plan Note (Signed)
He continues to have improvement in right-sided weakness.  He is still bothered by his hand weakness.  He sees neurology in August.  I have reminded him to continue his home exercises and discuss his weakness with neurology at his follow-up visit

## 2022-03-16 NOTE — Assessment & Plan Note (Signed)
Repeat BMET today.  

## 2022-03-16 NOTE — Assessment & Plan Note (Signed)
His blood pressure remains uncontrolled.  It is better than it has been previously.  He does have a history of allergy to amlodipine.  He is on max dose hydralazine, isosorbide mononitrate, Entresto.  I do not think we should increase his spironolactone any further.  His heart rate probably could tolerate an increase in his carvedilol.  I did review his case with Dr. Duke Salvia.  He would have to be off of Entresto and spironolactone for several days to get renin and aldosterone activity measured.  We could do a CT or MRI of his abdomen to rule out adrenal hyperplasia/adenoma.  Recent renal Dopplers were negative for significant renal artery stenosis.  Increase carvedilol to 18.75 mg twice daily  Continue hydralazine 10 mg 3 times a day, isosorbide mononitrate 120 mg daily, Entresto 97/103 mg twice daily, spironolactone 25 mg daily  BMET today  Follow-up Pharm.D. hypertension clinic in 3 to 4 weeks.  Consider addition of doxazosin if BP remains above goal.

## 2022-03-16 NOTE — Assessment & Plan Note (Signed)
Cardiomyopathy likely related to uncontrolled hypertension.  At last visit, we discussed proceeding with stress testing.  I think a follow-up cardiac MRI would be adequate to reassess LV function and rule out scar.  Therefore, we will hold off on stress testing for now.  Follow-up with Dr. Eden Emms or me in 2 to 3 months.  If his blood pressure is better controlled at that time, we will arrange cardiac MRI.

## 2022-03-17 LAB — BASIC METABOLIC PANEL
BUN/Creatinine Ratio: 19 (ref 10–24)
BUN: 24 mg/dL (ref 8–27)
CO2: 24 mmol/L (ref 20–29)
Calcium: 9.5 mg/dL (ref 8.6–10.2)
Chloride: 101 mmol/L (ref 96–106)
Creatinine, Ser: 1.27 mg/dL (ref 0.76–1.27)
Glucose: 161 mg/dL — ABNORMAL HIGH (ref 70–99)
Potassium: 4.5 mmol/L (ref 3.5–5.2)
Sodium: 139 mmol/L (ref 134–144)
eGFR: 59 mL/min/{1.73_m2} — ABNORMAL LOW (ref >59)

## 2022-03-22 ENCOUNTER — Telehealth: Payer: Self-pay | Admitting: Neurology

## 2022-03-22 NOTE — Telephone Encounter (Signed)
Rescheduled 8/7 appointment with pt's niece over the phone - MD out

## 2022-04-11 ENCOUNTER — Ambulatory Visit (INDEPENDENT_AMBULATORY_CARE_PROVIDER_SITE_OTHER): Payer: Medicare HMO | Admitting: Pharmacist

## 2022-04-11 VITALS — BP 140/62 | HR 65 | Wt 168.0 lb

## 2022-04-11 DIAGNOSIS — I10 Essential (primary) hypertension: Secondary | ICD-10-CM

## 2022-04-11 DIAGNOSIS — I5020 Unspecified systolic (congestive) heart failure: Secondary | ICD-10-CM | POA: Diagnosis not present

## 2022-04-11 MED ORDER — CARVEDILOL 25 MG PO TABS: 25.0000 mg | ORAL_TABLET | Freq: Two times a day (BID) | ORAL | 3 refills | Status: DC

## 2022-04-11 NOTE — Progress Notes (Addendum)
Patient ID: Joseph Patrick                 DOB: 03-Nov-1945                      MRN: [id number]     HPI: Joseph Patrick is a 76 y.o. male patient of Dr Eden Emms referred to HTN clinic by Joseph Newcomer, PA. PMH is significant for HFrEF with EF 25-30% on 12/2021 echo, HTN, CVA in 12/2021 (BP 260/120s at the time), renal artery ultrasound 02/2022 with 1-59% L stenosis, HLD, DM, and CKD. Last seen by Lorin Picket on 03/16/22 where BP was 160/80 and carvedilol was increased to 18.75mg  BID.  Pt presents today with his niece in good spirits. Has been checking BP twice a day at home, usually ranges 130-160 systolic. Checks in the AM usually before taking his meds and then again in the afternoon. Takes AM meds around 6-7am when he wakes up, PM meds 7-9pm before bed, and middle hydralazine dose around noon. Reports tolerating meds well. Denies dizziness, headache, SOB, and LE edema. Has made changes in his diet since his stroke, cut out caffeine, watching portions, cut back on fast food, limits salt intake. Reports he's had high BP for years. Denies NSAID use. Brings in home bicep cuff that he's had for 1-2 months.  Home cuff - 160/67, recheck 152/61 Clinic cuff - 140/62 x 2  Current HTN/HF meds: carvedilol 18.75mg  BID, Farxiga 10mg  daily, Entresto 97-103mg  BID, spironolactone 25mg  daily, hydralazine 100mg  TID (6am, 12pm, 7-9pm), Imdur 120mg  daily Previously tried: amlodipine - itching BP goal: <130/50mmHg  Family History: HF in his sister.  Social History: Denies tobacco, alcohol and drug use.  Diet: Likes and chicken. Avoids salt. Has been watching portions. No caffeine. Mostly drinks water. Has made a lot of dietary improvements since his stroke.  Exercise: Stairs at home, elliptical.  Home BP readings: 137/68 - 173/75, most in the 140-150s/60-70s  Wt Readings from Last 3 Encounters:  03/16/22 169 lb 12.8 oz (77 kg)  02/20/22 167 lb 9.6 oz (76 kg)  02/15/22 176 lb (79.8 kg)   BP Readings from Last 3  Encounters:  03/16/22 (!) 160/80  02/20/22 (!) 210/90  02/15/22 (!) 196/118   Pulse Readings from Last 3 Encounters:  03/16/22 68  02/20/22 70  02/15/22 75    Renal function: CrCl cannot be calculated (Patient's most recent lab result is older than the maximum 21 days allowed.).  Past Medical History:  Diagnosis Date   CVA (cerebral vascular accident) [facility])    april 2023   Diabetes mellitus without complication (HCC)    Hypertension     Current Outpatient Medications on File Prior to Visit  Medication Sig Dispense Refill   aspirin EC 81 MG tablet Take 1 tablet (81 mg total) by mouth daily. Swallow whole. 90 tablet 3   atorvastatin (LIPITOR) 40 MG tablet Take 1 tablet (40 mg total) by mouth daily. 90 tablet 1   carvedilol (COREG) 12.5 MG tablet Take 1.5 tablets (18.75 mg total) by mouth 2 (two) times daily. 270 tablet 3   Continuous Blood Gluc Receiver (FREESTYLE LIBRE READER) DEVI 1 each by Does not apply route daily. 3 each 3   Continuous Blood Gluc Sensor (FREESTYLE LIBRE 3 SENSOR) MISC 1 each by Does not apply route daily. Place 1 sensor on the skin every 14 days. Use to check glucose continuously 2 each 3   dapagliflozin propanediol (FARXIGA) 10 MG TABS tablet  Take 1 tablet (10 mg total) by mouth daily before breakfast. 30 tablet 0   hydrALAZINE (APRESOLINE) 100 MG tablet Take 1 tablet (100 mg total) by mouth 3 (three) times daily. 90 tablet 1   isosorbide mononitrate (IMDUR) 120 MG 24 hr tablet Take 1 tablet (120 mg total) by mouth daily. 90 tablet 1   sacubitril-valsartan (ENTRESTO) 97-103 MG Take 1 tablet by mouth 2 (two) times daily. 180 tablet 1   spironolactone (ALDACTONE) 25 MG tablet Take 1 tablet (25 mg total) by mouth daily. 90 tablet 1   Vitamin D, Ergocalciferol, (DRISDOL) 1.25 MG (50000 UNIT) CAPS capsule Take 1 capsule (50,000 Units total) by mouth every 7 (seven) days. 5 capsule 0   No current facility-administered medications on file prior to visit.     Allergies  Allergen Reactions   Amlodipine Itching     Assessment/Plan:  1. Hypertension/HFrEF - BP improving and closer to goal < 130/56mmHg. Will increase carvedilol to 25mg  BID and continue Farxiga 10mg  daily, Entresto 97-103mg  BID, spironolactone 25mg  daily, hydralazine 100mg  TID, and Imdur 120mg  daily. He'll shift his 2nd hydralazine dose back to around 1pm. Sees neuro in 3-4 weeks - will call pt after that appt if BP is elevated and can consider increasing spironolactone to 50mg  daily if needed. Otherwise, pt advised to call clinic if SBP > 150 on a regular basis. Of note, his home cuff measured 10-47mmHg higher systolic than manual clinic reading.  Joseph Patrick, PharmD, BCACP, CPP [facility] [address] [place],  Phone: [phone]; Fax: [phone] 04/11/2022 9:26 AM

## 2022-04-11 NOTE — Patient Instructions (Addendum)
It was nice to see you today  Your blood pressure goal is < 130/94mmHg  Increase your carvedilol to 25mg  twice daily  Continue taking your other medications  Check your blood pressure at least 1 hour after taking your medications  Move your second hydralazine dose closer to 1pm

## 2022-04-23 ENCOUNTER — Inpatient Hospital Stay: Payer: Medicare HMO | Admitting: Neurology

## 2022-04-27 ENCOUNTER — Other Ambulatory Visit: Payer: Self-pay | Admitting: Physician Assistant

## 2022-04-29 ENCOUNTER — Emergency Department (HOSPITAL_COMMUNITY)
Admission: EM | Admit: 2022-04-29 | Discharge: 2022-04-29 | Disposition: A | Discharge status: Home / Self Care | Payer: Medicare HMO | Attending: Emergency Medicine | Admitting: Emergency Medicine

## 2022-04-29 ENCOUNTER — Ambulatory Visit (HOSPITAL_COMMUNITY)
Admission: EM | Admit: 2022-04-29 | Discharge: 2022-04-29 | Disposition: A | Discharge status: Home / Self Care | Payer: Medicare HMO

## 2022-04-29 ENCOUNTER — Other Ambulatory Visit: Payer: Self-pay

## 2022-04-29 ENCOUNTER — Emergency Department (HOSPITAL_COMMUNITY): Payer: Medicare HMO

## 2022-04-29 ENCOUNTER — Encounter (HOSPITAL_COMMUNITY): Payer: Self-pay | Admitting: Emergency Medicine

## 2022-04-29 DIAGNOSIS — M79601 Pain in right arm: Secondary | ICD-10-CM | POA: Diagnosis not present

## 2022-04-29 DIAGNOSIS — N189 Chronic kidney disease, unspecified: Secondary | ICD-10-CM | POA: Diagnosis not present

## 2022-04-29 DIAGNOSIS — Z79899 Other long term (current) drug therapy: Secondary | ICD-10-CM | POA: Insufficient documentation

## 2022-04-29 DIAGNOSIS — M25531 Pain in right wrist: Secondary | ICD-10-CM | POA: Insufficient documentation

## 2022-04-29 DIAGNOSIS — Z7982 Long term (current) use of aspirin: Secondary | ICD-10-CM | POA: Insufficient documentation

## 2022-04-29 DIAGNOSIS — I129 Hypertensive chronic kidney disease with stage 1 through stage 4 chronic kidney disease, or unspecified chronic kidney disease: Secondary | ICD-10-CM | POA: Diagnosis not present

## 2022-04-29 DIAGNOSIS — M25511 Pain in right shoulder: Secondary | ICD-10-CM | POA: Diagnosis not present

## 2022-04-29 DIAGNOSIS — W19XXXA Unspecified fall, initial encounter: Secondary | ICD-10-CM | POA: Insufficient documentation

## 2022-04-29 DIAGNOSIS — R030 Elevated blood-pressure reading, without diagnosis of hypertension: Secondary | ICD-10-CM | POA: Diagnosis not present

## 2022-04-29 MED ORDER — DICLOFENAC SODIUM 1 % EX GEL: 2.0000 g | Freq: Four times a day (QID) | CUTANEOUS | 0 refills | Status: DC

## 2022-04-29 MED ORDER — SACUBITRIL-VALSARTAN 97-103 MG PO TABS
1.0000 | ORAL_TABLET | Freq: Two times a day (BID) | ORAL | Status: DC
  Filled 2022-04-29: qty 1

## 2022-04-29 MED ORDER — LIDOCAINE 5 % EX PTCH
1.0000 | MEDICATED_PATCH | CUTANEOUS | 0 refills | Status: DC
Start: 2022-04-29 — End: 2022-06-07

## 2022-04-29 MED ORDER — HYDRALAZINE HCL 50 MG PO TABS: 100.0000 mg | ORAL_TABLET | Freq: Three times a day (TID) | ORAL | Status: DC

## 2022-04-29 NOTE — Discharge Instructions (Addendum)
Please go to the emergency department as soon as you leave urgent care for further evaluation and management given elevated blood pressure reading in urgent care.

## 2022-04-29 NOTE — ED Notes (Signed)
Spoke to [employer], pa about patient's intake assessment

## 2022-04-29 NOTE — Discharge Instructions (Addendum)
Make sure to take your blood pressure medications when you get home  Recheck your blood pressure in the morning.  Make sure to call cardiology and let them know that your blood pressure has been elevated.  Also make sure to keep your follow-up appoint with neurology next week  Please see the orthopedist for further evaluation of your pain

## 2022-04-29 NOTE — ED Triage Notes (Signed)
Patient here with complaint of right wrist and right shoulder pain after tripping and falling one and half weeks ago . Patient complains of swelling to right wrist as well.

## 2022-04-29 NOTE — ED Provider Notes (Signed)
[facility] EMERGENCY DEPARTMENT Provider Note   CSN: [id number] Arrival date & time: 04/29/22  1547    History  Chief Complaint  Patient presents with   Joseph Patrick is a 76 y.o. male history of uncontrolled hypertension, recent CVA with right-sided deficits, CKD, cardiomyopathy here for evaluation of right shoulder and wrist pain.  Patient states he fell into a door while walking approximately a week and a half ago.  Has had pain which he describes an aching when he lifts his right shoulder over his head.  Also states he fell and inverted wrist.  Has had pain to the wrist since.  Has noted when he uses his wrist frequently it becomes swollen, no redness or warmth.  He has known right-sided weakness from his prior stroke.  Walks with a cane.  No new facial droop, new weakness.  States he has had issues to his right shoulder previously and needed a steroid injection.  Initially seen in urgent care.  Concern for elevated blood pressure, subsequently sent here.  Patient states he is not concerned with his blood pressure.  He has not been able to take his medications at home today due to being urgent care and here in the emergency department.  No chest pain, shortness of breath, headache, new weakness or numbness.   HPI     Home Medications Prior to Admission medications   Medication Sig Start Date End Date Taking? Authorizing Provider  diclofenac Sodium (VOLTAREN) 1 % GEL Apply 2 g topically 4 (four) times daily. 04/29/22  Yes Markcus Lazenby A, PA-C  lidocaine (LIDODERM) 5 % Place 1 patch onto the skin daily. Remove & Discard patch within 12 hours or as directed by MD 04/29/22  Yes Exavier Lina A, PA-C  aspirin EC 81 MG tablet Take 1 tablet (81 mg total) by mouth daily. Swallow whole. 02/20/22   Tereso Newcomer T, PA-C  atorvastatin (LIPITOR) 40 MG tablet Take 1 tablet (40 mg total) by mouth daily. 03/07/22   Tereso Newcomer T, PA-C  carvedilol (COREG) 25 MG  tablet Take 1 tablet (25 mg total) by mouth 2 (two) times daily. 04/11/22   Tereso Newcomer T, PA-C  Continuous Blood Gluc Receiver (FREESTYLE LIBRE READER) DEVI 1 each by Does not apply route daily. 02/07/22   Angiulli, Mcarthur Rossetti, PA-C  Continuous Blood Gluc Sensor (FREESTYLE LIBRE 3 SENSOR) MISC 1 each by Does not apply route daily. Place 1 sensor on the skin every 14 days. Use to check glucose continuously 02/07/22   Angiulli, Mcarthur Rossetti, PA-C  dapagliflozin propanediol (FARXIGA) 10 MG TABS tablet Take 1 tablet (10 mg total) by mouth daily before breakfast. Patient not taking: Reported on 04/29/2022 02/07/22   Angiulli, Mcarthur Rossetti, PA-C  hydrALAZINE (APRESOLINE) 100 MG tablet TAKE 1 TABLET BY MOUTH 3 TIMES DAILY. 04/27/22   Tereso Newcomer T, PA-C  isosorbide mononitrate (IMDUR) 120 MG 24 hr tablet Take 1 tablet (120 mg total) by mouth daily. 03/07/22   Alben Spittle, Scott T, PA-C  sacubitril-valsartan (ENTRESTO) 97-103 MG Take 1 tablet by mouth 2 (two) times daily. 03/07/22   Tereso Newcomer T, PA-C  spironolactone (ALDACTONE) 25 MG tablet Take 1 tablet (25 mg total) by mouth daily. 03/07/22   Tereso Newcomer T, PA-C  Vitamin D, Ergocalciferol, (DRISDOL) 1.25 MG (50000 UNIT) CAPS capsule Take 1 capsule (50,000 Units total) by mouth every 7 (seven) days. 02/13/22   Angiulli, Mcarthur Rossetti, PA-C      Allergies  Amlodipine    Review of Systems   Review of Systems  Constitutional: Negative.   HENT: Negative.    Respiratory: Negative.    Cardiovascular: Negative.   Gastrointestinal: Negative.   Genitourinary: Negative.   Musculoskeletal:        Right shoulder pain with overhead movement, right wrist pain with flexion  Skin: Negative.   Neurological:  Positive for weakness (chronic right weakness). Negative for dizziness, tremors, seizures, syncope, facial asymmetry, speech difficulty, light-headedness, numbness and headaches.  All other systems reviewed and are negative.   Physical Exam Updated Vital Signs BP (!)  173/81   Pulse 71   Temp 98 F (36.7 C) (Oral)   Resp 16   SpO2 98%  Physical Exam Vitals and nursing note reviewed.  Constitutional:      General: He is not in acute distress.    Appearance: He is well-developed. He is not ill-appearing, toxic-appearing or diaphoretic.  HENT:     Head: Normocephalic and atraumatic.     Nose: Nose normal.     Mouth/Throat:     Mouth: Mucous membranes are moist.  Eyes:     Pupils: Pupils are equal, round, and reactive to light.  Cardiovascular:     Rate and Rhythm: Normal rate and regular rhythm.     Pulses: Normal pulses.          Radial pulses are 2+ on the right side and 2+ on the left side.       Dorsalis pedis pulses are 2+ on the right side and 2+ on the left side.     Heart sounds: Normal heart sounds.  Pulmonary:     Effort: Pulmonary effort is normal. No respiratory distress.     Breath sounds: Normal breath sounds.  Abdominal:     General: Bowel sounds are normal. There is no distension.     Palpations: Abdomen is soft.  Musculoskeletal:        General: Normal range of motion.     Cervical back: Normal range of motion and neck supple.     Comments: Decreased range of motion with overhead movement right shoulder, positive Hawkin, negative empty can.  Full range of motion at elbow.  No bony tenderness to humerus, forearm or hand.  Tenderness with flexion at right wrist.  Nontender at scaphoid.  No bony tenderness to hand.  No erythema or warmth.  Skin:    General: Skin is warm and dry.  Neurological:     General: No focal deficit present.     Mental Status: He is alert and oriented to person, place, and time.     Comments: Cranial nerves II through XII grossly intact Mild decrease in grip to right at baseline per family Equal strength with flexion extension at shoulders and elbows Equal strength lower extremities Ambulatory with cane without ataxia Intact sensation    ED Results / Procedures / Treatments   Labs (all labs  ordered are listed, but only abnormal results are displayed) Labs Reviewed - No data to display  EKG None  Radiology DG Shoulder Right  Result Date: 04/29/2022 CLINICAL DATA:  Trauma, fall EXAM: RIGHT SHOULDER - 2+ VIEW COMPARISON:  None Available. FINDINGS: No recent fracture or dislocation is seen. Degenerative changes are noted with bony spurs in the shoulder joint. Degenerative changes are also noted in right AC joint. IMPRESSION: No fracture or dislocation is seen in right shoulder. Degenerative changes are noted. Electronically Signed   By: Harlan Stains.D.  On: 04/29/2022 16:40   DG Wrist Complete Right  Result Date: 04/29/2022 CLINICAL DATA:  Trauma, fall EXAM: RIGHT WRIST - COMPLETE 3+ VIEW COMPARISON:  None Available. FINDINGS: No fracture or dislocation is seen. Degenerative changes are noted with bony spurs in first carpometacarpal and first metacarpophalangeal and second carpometacarpal joints. IMPRESSION: No recent fracture or dislocation is seen. Degenerative changes are noted in multiple joints. Electronically Signed   By: Ernie Avena M.D.   On: 04/29/2022 16:35    Procedures Procedures    Medications Ordered in ED Medications - No data to display  ED Course/ Medical Decision Making/ A&P    76 year old known uncontrolled hypertension, prior CVA with right-sided deficits, CKD here for evaluation of fall.  Fell into a door frame about a week and a half ago.  Has some tightness through shoulder with overhead motion as well as some pain when he flexes at his wrist.  Has decreased range of motion to his shoulder however denies any overt pain.  No new weakness.  Suspect likely ligament injury.  With regards to his wrist pain has been having to walk with a cane due to prior stroke.  Suspect overuse injury.  Low suspicion for occult scaphoid fracture, septic joint, ischemia, VTE, infectious process.    He was noted to be significantly hypertensive here however has  not taken his home meds that were due hours ago.  Neurovascularly intact.  No headache, chest pain, shortness of breath.  Did offer further work-up with labs, imaging and EKG however patient declined.  Voices understanding of risk vs benefit. States he has follow-up appointment with cardiology and neurology next week.  Also encouraged close follow-up with orthopedics.  He is agreeable.  He declined [place] giving him his home nightly blood pressure medications prior to leaving.  States he will take these when he gets home. Low suspicion for HTN urgency or emergency at this time as he is asymptomatic.   The patient has been appropriately medically screened and/or stabilized in the ED. I have low suspicion for any other emergent medical condition which would require further screening, evaluation or treatment in the ED or require inpatient management.  Patient is hemodynamically stable and in no acute distress.  Patient able to ambulate in department prior to ED.  Evaluation does not show acute pathology that would require ongoing or additional emergent interventions while in the emergency department or further inpatient treatment.  I have discussed the diagnosis with the patient and answered all questions.  Pain is been managed while in the emergency department and patient has no further complaints prior to discharge.  Patient is comfortable with plan discussed in room and is stable for discharge at this time.  I have discussed strict return precautions for returning to the emergency department.  Patient was encouraged to follow-up with PCP/specialist refer to at discharge.                            Medical Decision Making Amount and/or Complexity of Data Reviewed Independent Historian:     Details: Niece External Data Reviewed: labs, radiology, ECG and notes. Radiology: ordered and independent interpretation performed. Decision-making details documented in ED Course.  Risk OTC drugs. Prescription drug  management. Decision regarding hospitalization. Diagnosis or treatment significantly limited by social determinants of health.          Final Clinical Impression(s) / ED Diagnoses Final diagnoses:  Fall, initial encounter  Acute pain of  right shoulder  Right wrist pain  Elevated blood pressure reading    Rx / DC Orders ED Discharge Orders          Ordered    lidocaine (LIDODERM) 5 %  Every 24 hours        04/29/22 2139    diclofenac Sodium (VOLTAREN) 1 % GEL  4 times daily        04/29/22 2139              Marky Buresh A, PA-C 04/29/22 2150    Cathren Laine, MD 04/29/22 2259

## 2022-04-29 NOTE — ED Triage Notes (Signed)
Right arm pain: shoulder and hand swelling .    Patient had a stroke 2 months ago. Right side of body impacted.  Reports movement of extremities was almost back to normal with therapy  Patient had a stumbling event about 3 weeks ago.  Patient fell into door frame.  Patient reports jamming wrist.  Patient thought this would go away.  Reports no improvement.  Swelling to right hand and top of right shoulder.

## 2022-04-29 NOTE — ED Notes (Signed)
Patient verbalizes understanding of d/c instructions. Opportunities for questions and answers were provided. Pt d/c from ED and ambulated to lobby with wife.  

## 2022-04-29 NOTE — ED Provider Notes (Addendum)
[facility]    CSN: [id number] Arrival date & time: 04/29/22  1348      History   Chief Complaint Chief Complaint  Patient presents with   Arm Pain    HPI Joseph Patrick is a 76 y.o. male.   Patient presents with right shoulder pain and right hand/wrist pain that started a few weeks prior.  Patient reports that it started after an injury.  States that he fell into a door frame with his shoulder impacting the door frame and then fell the rest of the way down and landed directly on his wrist.  His pain has been persistent since then.  Denies hitting head or losing consciousness during fall.  He states that he had a stroke about 2 months ago and has been doing physical therapy with improvement in right-sided weakness.  Although, after injury his weakness went back to weakness that was present right after stroke.  Called to room during triage given that patient's blood pressure was significantly elevated.  Patient states that he has taken his blood pressure medication today and it was recently changed by cardiologist.  His carvedilol dose was increased to 25 mg about 1 week ago.  He denies chest pain, shortness of breath, headache, dizziness, blurred vision, nausea, vomiting.  He has been sent to ED intermittently since the stroke occurred given poorly controlled blood pressure.  Patient states that he checks his blood pressure at home routinely.  He did not check it today but did check it yesterday.  His blood pressure yesterday was systolic 0000000.   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 No pertinent surgical history.     Home Medications    Prior to Admission medications   Medication Sig Start Date End Date Taking? Authorizing Provider  aspirin EC 81 MG tablet Take 1 tablet (81 mg total) by mouth daily. Swallow whole. 02/20/22   Richardson Dopp T, PA-C  atorvastatin (LIPITOR) 40 MG tablet Take 1 tablet (40 mg total) by mouth daily. 03/07/22   Richardson Dopp T, PA-C  carvedilol (COREG) 25 MG tablet Take 1 tablet (25 mg total) by mouth 2 (two) times daily. 04/11/22   Richardson Dopp T, PA-C  Continuous Blood Gluc Receiver (FREESTYLE LIBRE READER) DEVI 1 each by Does not apply route daily. 02/07/22   Angiulli, Lavon Paganini, PA-C  Continuous Blood Gluc Sensor (FREESTYLE LIBRE 3 SENSOR) MISC 1 each by Does not apply route daily. Place 1 sensor on the skin every 14 days. Use to check glucose continuously 02/07/22   Angiulli, Lavon Paganini, PA-C  dapagliflozin propanediol (FARXIGA) 10 MG TABS tablet Take 1 tablet (10 mg total) by mouth daily before breakfast. Patient not taking: Reported on 04/29/2022 02/07/22   Angiulli, Lavon Paganini, PA-C  hydrALAZINE (APRESOLINE) 100 MG tablet TAKE 1 TABLET BY MOUTH 3 TIMES DAILY. 04/27/22   Richardson Dopp  T, PA-C  isosorbide mononitrate (IMDUR) 120 MG 24 hr tablet Take 1 tablet (120 mg total) by mouth daily. 03/07/22   Alben Spittle, Scott T, PA-C  sacubitril-valsartan (ENTRESTO) 97-103 MG Take 1 tablet by mouth 2 (two) times daily. 03/07/22   Tereso Newcomer T, PA-C  spironolactone (ALDACTONE) 25 MG tablet Take 1 tablet (25 mg total) by mouth daily. 03/07/22   Tereso Newcomer T, PA-C  Vitamin D, Ergocalciferol, (DRISDOL) 1.25 MG (50000 UNIT) CAPS capsule Take 1 capsule (50,000 Units total) by mouth every 7 (seven) days. 02/13/22   Angiulli, Mcarthur Rossetti, PA-C    Family  History Family History  Problem Relation Age of Onset   Heart failure Sister     Social History Social History   Tobacco Use   Smoking status: Never   Smokeless tobacco: Never  Vaping Use   Vaping Use: Never used  Substance Use Topics   Alcohol use: Never   Drug use: Never     Allergies   Amlodipine   Review of Systems Review of Systems Per HPI  Physical Exam Triage Vital Signs ED Triage Vitals  Enc Vitals Group     BP 04/29/22 1506 (!) 185/81     Pulse Rate 04/29/22 1506 71     Resp 04/29/22 1506 20     Temp 04/29/22 1506 98.2 F (36.8 C)     Temp Source 04/29/22 1506 Oral     SpO2 04/29/22 1506 96 %     Weight --      Height --      Head Circumference --      Peak Flow --      Pain Score 04/29/22 1502 0     Pain Loc --      Pain Edu? --      Excl. in GC? --    No data found.  Updated Vital Signs BP (!) 182/96 (BP Location: Left Arm)   Pulse 71   Temp 98.2 F (36.8 C) (Oral)   Resp 20   SpO2 96%   Visual Acuity Right Eye Distance:   Left Eye Distance:   Bilateral Distance:    Right Eye Near:   Left Eye Near:    Bilateral Near:     Physical Exam Constitutional:      General: He is not in acute distress.    Appearance: Normal appearance. He is not toxic-appearing or diaphoretic.  HENT:     Head: Normocephalic and atraumatic.  Eyes:     Extraocular Movements: Extraocular movements intact.     Conjunctiva/sclera: Conjunctivae normal.     Pupils: Pupils are equal, round, and reactive to light.  Cardiovascular:     Rate and Rhythm: Normal rate and regular rhythm.     Pulses: Normal pulses.     Heart sounds: Normal heart sounds.  Pulmonary:     Effort: Pulmonary effort is normal.     Breath sounds: Normal breath sounds.  Musculoskeletal:     Comments: Exam of shoulder and hand deferred given high blood pressure reading.  Neurological:     General: No focal deficit present.     Mental Status: He is alert and oriented to person, place,  and time. Mental status is at baseline.     Comments: Right-sided weakness since stroke 2 months ago.  Patient walks with a cane since stroke.  Psychiatric:        Mood and Affect: Mood normal.        Behavior: Behavior normal.  Thought Content: Thought content normal.        Judgment: Judgment normal.      UC Treatments / Results  Labs (all labs ordered are listed, but only abnormal results are displayed) Labs Reviewed - No data to display  EKG   Radiology No results found.  Procedures Procedures (including critical care time)  Medications Ordered in UC Medications - No data to display  Initial Impression / Assessment and Plan / UC Course  I have reviewed the triage vital signs and the nursing notes.  Pertinent labs & imaging results that were available during my care of the patient were reviewed by me and considered in my medical decision making (see chart for details).     Blood pressure was rechecked multiple times including manual blood pressure check.  It was still significantly elevated.  Systolic blood pressure was 213 with one check.  Patient was advised to go to the emergency department for further evaluation and management of high blood pressure given patient's history of stroke.  Patient was agreeable with plan.  Vital signs stable at discharge.  Agree with patient's family member transporting him to the hospital.  Exam of right arm injury and evaluation was deferred given elevated blood pressure reading and patient was advised that this will be evaluated at the ER as well.  Patient left via self transport. Final Clinical Impressions(s) / UC Diagnoses   Final diagnoses:  Elevated blood pressure reading  Right arm pain  Fall, initial encounter     Discharge Instructions      Please go to the emergency department as soon as you leave urgent care for further evaluation and management given elevated blood pressure reading in urgent care.    ED  Prescriptions   None    PDMP not reviewed this encounter.   Gustavus Bryant, [place] 04/29/22 1540    Gustavus Bryant, [place] 04/29/22 1541

## 2022-04-29 NOTE — ED Notes (Signed)
Patient is being discharged from the Urgent Care and sent to the Emergency Department via pov . Per [place], [place], patient is in need of higher level of care due to presentation, stroke history, hypertension. Patient is aware and verbalizes understanding of plan of care.  Vitals:   04/29/22 1506 04/29/22 1519  BP: (!) 185/81 (!) 182/96  Pulse: 71   Resp: 20   Temp: 98.2 F (36.8 C)   SpO2: 96%

## 2022-05-08 ENCOUNTER — Telehealth: Payer: Self-pay | Admitting: Cardiovascular Disease

## 2022-05-08 ENCOUNTER — Other Ambulatory Visit: Payer: Self-pay

## 2022-05-08 ENCOUNTER — Encounter: Payer: Self-pay | Admitting: Neurology

## 2022-05-08 ENCOUNTER — Ambulatory Visit: Payer: Medicare HMO | Admitting: Neurology

## 2022-05-08 ENCOUNTER — Telehealth: Payer: Self-pay | Admitting: Pharmacist

## 2022-05-08 VITALS — BP 157/79 | HR 72 | Ht 69.0 in | Wt 171.0 lb

## 2022-05-08 DIAGNOSIS — M25511 Pain in right shoulder: Secondary | ICD-10-CM

## 2022-05-08 DIAGNOSIS — I5020 Unspecified systolic (congestive) heart failure: Secondary | ICD-10-CM

## 2022-05-08 DIAGNOSIS — G8929 Other chronic pain: Secondary | ICD-10-CM

## 2022-05-08 DIAGNOSIS — I6381 Other cerebral infarction due to occlusion or stenosis of small artery: Secondary | ICD-10-CM | POA: Diagnosis not present

## 2022-05-08 DIAGNOSIS — I69351 Hemiplegia and hemiparesis following cerebral infarction affecting right dominant side: Secondary | ICD-10-CM | POA: Diagnosis not present

## 2022-05-08 MED ORDER — ENTRESTO 97-103 MG PO TABS: 1.0000 | ORAL_TABLET | Freq: Two times a day (BID) | ORAL | 3 refills | Status: DC

## 2022-05-08 MED ORDER — SPIRONOLACTONE 50 MG PO TABS: 50.0000 mg | ORAL_TABLET | Freq: Every day | ORAL | 5 refills | Status: DC

## 2022-05-08 NOTE — Patient Instructions (Addendum)
I had a long d/w patient about his recent lacunar stroke,right hemiparesis, risk for recurrent stroke/TIAs, personally independently reviewed imaging studies and stroke evaluation results and answered questions.Continue aspirin 81 mg daily  for secondary stroke prevention and maintain strict control of hypertension with blood pressure goal below 130/90, diabetes with hemoglobin A1c goal below 6.5% and lipids with LDL cholesterol goal below 70 mg/dL. I also advised the patient to eat a healthy diet with plenty of whole grains, cereals, fruits and vegetables, exercise regularly and maintain ideal body weight .he was advised to use his cane when walking outdoors and long distances.  Referral to rehab MD for management of right shoulder pain following his recent fall.  Followup in the future with my nurse practitioner in 6 months or call earlier if necessary.  Stroke Prevention Some medical conditions and behaviors can lead to a higher chance of having a stroke. You can help prevent a stroke by eating healthy, exercising, not smoking, and managing any medical conditions you have. Stroke is a leading cause of functional impairment. Primary prevention is particularly important because a majority of strokes are first-time events. Stroke changes the lives of not only those who experience a stroke but also their family and other caregivers. How can this condition affect me? A stroke is a medical emergency and should be treated right away. A stroke can lead to brain damage and can sometimes be life-threatening. If a person gets medical treatment right away, there is a better chance of surviving and recovering from a stroke. What can increase my risk? The following medical conditions may increase your risk of a stroke: Cardiovascular disease. High blood pressure (hypertension). Diabetes. High cholesterol. Sickle cell disease. Blood clotting disorders (hypercoagulable state). Obesity. Sleep disorders  (obstructive sleep apnea). Other risk factors include: Being older than age 34. Having a history of blood clots, stroke, or mini-stroke (transient ischemic attack, TIA). Genetic factors, such as race, ethnicity, or a family history of stroke. Smoking cigarettes or using other tobacco products. Taking birth control pills, especially if you also use tobacco. Heavy use of alcohol or drugs, especially cocaine and methamphetamine. Physical inactivity. What actions can I take to prevent this? Manage your health conditions High cholesterol levels. Eating a healthy diet is important for preventing high cholesterol. If cholesterol cannot be managed through diet alone, you may need to take medicines. Take any prescribed medicines to control your cholesterol as told by your health care provider. Hypertension. To reduce your risk of stroke, try to keep your blood pressure below 130/80. Eating a healthy diet and exercising regularly are important for controlling blood pressure. If these steps are not enough to manage your blood pressure, you may need to take medicines. Take any prescribed medicines to control hypertension as told by your health care provider. Ask your health care provider if you should monitor your blood pressure at home. Have your blood pressure checked every year, even if your blood pressure is normal. Blood pressure increases with age and some medical conditions. Diabetes. Eating a healthy diet and exercising regularly are important parts of managing your blood sugar (glucose). If your blood sugar cannot be managed through diet and exercise, you may need to take medicines. Take any prescribed medicines to control your diabetes as told by your health care provider. Get evaluated for obstructive sleep apnea. Talk to your health care provider about getting a sleep evaluation if you snore a lot or have excessive sleepiness. Make sure that any other medical conditions you have,  such as  atrial fibrillation or atherosclerosis, are managed. Nutrition Follow instructions from your health care provider about what to eat or drink to help manage your health condition. These instructions may include: Reducing your daily calorie intake. Limiting how much salt (sodium) you use to 1,500 milligrams (mg) each day. Using only healthy fats for cooking, such as olive oil, canola oil, or sunflower oil. Eating healthy foods. You can do this by: Choosing foods that are high in fiber, such as whole grains, and fresh fruits and vegetables. Eating at least 5 servings of fruits and vegetables a day. Try to fill one-half of your plate with fruits and vegetables at each meal. Choosing lean protein foods, such as lean cuts of meat, poultry without skin, fish, tofu, beans, and nuts. Eating low-fat dairy products. Avoiding foods that are high in sodium. This can help lower blood pressure. Avoiding foods that have saturated fat, trans fat, and cholesterol. This can help prevent high cholesterol. Avoiding processed and prepared foods. Counting your daily carbohydrate intake.  Lifestyle If you drink alcohol: Limit how much you have to: 0-1 drink a day for women who are not pregnant. 0-2 drinks a day for men. Know how much alcohol is in your drink. In the [place] one drink equals one 12 oz bottle of beer (332m), one 5 oz glass of wine (1425m, or one 1 oz glass of hard liquor (4490m Do not use any products that contain nicotine or tobacco. These products include cigarettes, chewing tobacco, and vaping devices, such as e-cigarettes. If you need help quitting, ask your health care provider. Avoid secondhand smoke. Do not use drugs. Activity  Try to stay at a healthy weight. Get at least 30 minutes of exercise on most days, such as: Fast walking. Biking. Swimming. Medicines Take over-the-counter and prescription medicines only as told by your health care provider. Aspirin or blood thinners  (antiplatelets or anticoagulants) may be recommended to reduce your risk of forming blood clots that can lead to stroke. Avoid taking birth control pills. Talk to your health care provider about the risks of taking birth control pills if: You are over 35 44ars old. You smoke. You get very bad headaches. You have had a blood clot. Where to find more information American Stroke Association: www.strokeassociation.org Get help right away if: You or a loved one has any symptoms of a stroke. "BE FAST" is an easy way to remember the main warning signs of a stroke: B - Balance. Signs are dizziness, sudden trouble walking, or loss of balance. E - Eyes. Signs are trouble seeing or a sudden change in vision. F - Face. Signs are sudden weakness or numbness of the face, or the face or eyelid drooping on one side. A - Arms. Signs are weakness or numbness in an arm. This happens suddenly and usually on one side of the body. S - Speech. Signs are sudden trouble speaking, slurred speech, or trouble understanding what people say. T - Time. Time to call emergency services. Write down what time symptoms started. You or a loved one has other signs of a stroke, such as: A sudden, severe headache with no known cause. Nausea or vomiting. Seizure. These symptoms may represent a serious problem that is an emergency. Do not wait to see if the symptoms will go away. Get medical help right away. Call your local emergency services (911 in the [place] Do not drive yourself to the hospital. Summary You can help to prevent a stroke by eating healthy,  exercising, not smoking, limiting alcohol intake, and managing any medical conditions you may have. Do not use any products that contain nicotine or tobacco. These include cigarettes, chewing tobacco, and vaping devices, such as e-cigarettes. If you need help quitting, ask your health care provider. Remember "BE FAST" for warning signs of a stroke. Get help right away if you or a  loved one has any of these signs. This information is not intended to replace advice given to you by your health care provider. Make sure you discuss any questions you have with your health care provider. Document Revised: 04/04/2020 Document Reviewed: 04/04/2020 Elsevier Patient Education  [place].

## 2022-05-08 NOTE — Telephone Encounter (Signed)
Pt's medication was sent to pt's pharmacy as requested. Confirmation received.  °

## 2022-05-08 NOTE — Telephone Encounter (Signed)
Pt c/o medication issue:  1. Name of Medication: sacubitril-valsartan (ENTRESTO) 97-103 MG  2. How are you currently taking this medication (dosage and times per day)? Take 1 tablet by mouth 2 (two) times daily  3. Are you having a reaction (difficulty breathing--STAT)? no  4. What is your medication issue? Niece called to say that the patient was taking the medication above incorrectly since June. So should his still double up on spironolactone (ALDACTONE) 50 MG tablet. Please advise

## 2022-05-08 NOTE — Telephone Encounter (Signed)
Called pt to follow up with BP. Remained elevated at neuro appt this AM, no med changes made. Reports 140-150s systolic at home. Will increase his spironolactone from 25mg  to 50mg  daily. Recheck BMET in 1 week and recheck BP in 1 month. Pt and his wife are aware of med plan and follow up.

## 2022-05-08 NOTE — Telephone Encounter (Signed)
Spoke with the patient's niece who is calling to let us know that the patient never increased his Sherryll Burger he has continued taking Entresto 49-51 mg twice daily. They received a call from [facility] earlier about increasing spironolactone but wanted to make her aware that Sherryll Burger was never increased before they made any changes

## 2022-05-08 NOTE — Progress Notes (Signed)
[facility] [address] [place]. [place] [address] [phone]       OFFICE FOLLOW-UP NOTE  [name] Date of Birth:  29-Apr-1946 Medical Record Number:  [id number]   HPI: [name] is a 76 year old African-American male seen today for initial office follow-up visit for lacunar stroke.  He is accompanied by his niece.  History is obtained from them and review of electronic medical records and I personally reviewed pertinent available imaging films in PACS.  He has past medical history of hypertension diabetes.  His medications 3 months prior to imaging 10/08/2021 and blood pressure.  He noticed right foot numbness and imbalance with walking presented to the emergency room with blood pressure of [id number].  MRI scan of the brain revealed punctate infarct basal ganglia lacunar infarct.  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  Echocardiogram showed ejection fraction of 25 to 30%.  Hemoglobin A1c cholesterol is 127.  Started on dual antiplatelet therapy and discharged home but returned on 01/14/2022 with subjective worsening of his right-sided deficits.  Repeat MRI scan showed evolution of the left basal ganglia infarct.  He had more severe deficits.  He was transferred to inpatient rehab for discharge home and initially was doing well with significant improvement in his right-sided strength was able to ambulate in the foot brace and regain good strength.  He unfortunately was seen in the ER on multiple occasions for uncontrolled hypertension.  He had a fall few weeks ago and was seen in the ER.  Fortunately he had no fractures on the x-ray.  He was shoulder pain and movement.  He is noted some progression on the right side.  He is also noted some swelling in the right to be following up with the cardiology clinic for his.  States his sugars have been better.  He is tolerating Lipitor well without muscle aches and pains.  He  is on Coreg, hydralazine and Aldactone for blood pressure but if is yet not optimal but better than before and today it is 157/79.  ROS:   14 system review of systems is positive for weakness, fall, shoulder pain, foot drop, difficulty walking all other systems negative  PMH:  Past Medical History:  Diagnosis Date   CVA (cerebral vascular accident) ([place])    april 2023   Diabetes mellitus without complication ([place])    Hypertension     Social History:  Social History   Socioeconomic History   Marital status: Single    Spouse name: Not on file   Number of children: Not on file   Years of education: Not on file   Highest education level: Not on file  Occupational History   Not on file  Tobacco Use   Smoking status: Never   Smokeless tobacco: Never  Vaping Use   Vaping Use: Never used  Substance and Sexual Activity   Alcohol use: Never   Drug use: Never   Sexual activity: Not on file  Other Topics Concern   Not on file  Social History Narrative   Not on file   Social Determinants of Health   Financial Resource Strain: Not on file  Food Insecurity: Not on file  Transportation Needs: Not on file  Physical Activity: Not on file  Stress: Not on file  Social Connections: Not on file  Intimate Partner Violence: Not on file    Medications:   Current Outpatient Medications on File Prior to  Visit  Medication Sig Dispense Refill   aspirin EC 81 MG tablet Take 1 tablet (81 mg total) by mouth daily. Swallow whole. 90 tablet 3   atorvastatin (LIPITOR) 40 MG tablet Take 1 tablet (40 mg total) by mouth daily. 90 tablet 1   carvedilol (COREG) 25 MG tablet Take 1 tablet (25 mg total) by mouth 2 (two) times daily. 180 tablet 3   Cholecalciferol (VITAMIN D3 PO) Take 1 capsule by mouth daily.     dapagliflozin propanediol (FARXIGA) 10 MG TABS tablet Take 1 tablet (10 mg total) by mouth daily before breakfast. 30 tablet 0   hydrALAZINE (APRESOLINE) 100 MG tablet TAKE 1 TABLET BY MOUTH  3 TIMES DAILY. 270 tablet 3   isosorbide mononitrate (IMDUR) 120 MG 24 hr tablet Take 1 tablet (120 mg total) by mouth daily. 90 tablet 1   lidocaine (LIDODERM) 5 % Place 1 patch onto the skin daily. Remove & Discard patch within 12 hours or as directed by MD 30 patch 0   sacubitril-valsartan (ENTRESTO) 97-103 MG Take 1 tablet by mouth 2 (two) times daily. 180 tablet 1   spironolactone (ALDACTONE) 25 MG tablet Take 1 tablet (25 mg total) by mouth daily. 90 tablet 1   No current facility-administered medications on file prior to visit.    Allergies:   Allergies  Allergen Reactions   Amlodipine Itching    Physical Exam General: well developed, well nourished elderly African-American male, seated, in no evident distress Head: head normocephalic and atraumatic.  Neck: supple with no carotid or supraclavicular bruits Cardiovascular: regular rate and rhythm, no murmurs Musculoskeletal: no deformity Skin:  no rash/petichiae Vascular:  Normal pulses all extremities Vitals:   05/08/22 0905  BP: (!) 157/79  Pulse: 72   Neurologic Exam Mental Status: Awake and fully alert. Oriented to place and time. Recent and remote memory intact. Attention span, concentration and fund of knowledge appropriate. Mood and affect appropriate.  Cranial Nerves: Fundoscopic exam reveals sharp disc margins. Pupils equal, briskly reactive to light. Extraocular movements full without nystagmus. Visual fields full to confrontation. Hearing intact. Facial sensation intact.  Mild right lower facial asymmetry, tongue, palate moves normally and symmetrically.  Motor: Mild right hemiparesis 4+/5.  Right shoulder elevation limited due to pain.  Weakness of right grip and intrinsic hand muscles.  Orbits left or right upper extremity.  Fine finger movements are diminished on the right.  Mild right hip flexor and ankle dorsiflexor weakness.  Tone is increased on the right compared to the left.'s the right leg.  Normal strength on  the left. Sensory.: intact to touch ,pinprick .position and vibratory sensation.  Coordination: Rapid alternating movements normal in all extremities. Finger-to-nose and heel-to-shin performed accurately bilaterally. Gait and Station: Arises from chair without difficulty. Stance is normal. Gait demonstrates dragging of the right leg with mild foot drop and some circumduction..  Tandem walking not attempted.   Reflexes: 1+ and symmetric. Toes downgoing.   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  Vascular risk factors diabetes, hypertension, hyperlipidemia and intracranial atherosclerosis.     PLAN:I had a long d/w patient about his recent lacunar stroke,right hemiparesis, risk for recurrent stroke/TIAs, personally independently reviewed imaging studies and stroke evaluation results and answered questions.Continue aspirin 81 mg daily  for secondary stroke prevention and maintain strict control of hypertension with blood pressure goal below 130/90,  diabetes with hemoglobin A1c goal below 6.5% and lipids with LDL cholesterol goal below 70 mg/dL. I also advised the patient to eat a healthy diet with plenty of whole grains, cereals, fruits and vegetables, exercise regularly and maintain ideal body weight .he was advised to use his cane when walking outdoors and long distances.  Referral to rehab MD for management of right shoulder pain following his recent fall.  Followup in the future with my nurse practitioner in 6 months or call earlier if necessary. Greater than 50% of time during this 35 minute visit was spent on counseling,explanation of diagnosis: Lacunar stroke, hemiparesis planning of further management, discussion with patient and family and coordination of care Delia Heady, MD Note: This document was prepared with digital dictation and possible  smart phrase technology. Any transcriptional errors that result from this process are unintentional

## 2022-05-08 NOTE — Telephone Encounter (Signed)
His Entresto was increased to 97-103mg  BID on 03/07/22 with rx sent to his pharmacy, and his MyChart message from 6/22 confirms that he picked up the Entresto 97-103mg  dose, pharmacy also confirmed he picked up this dose on 6/22. Not sure how he would have continued on the 49-51mg  dose. Niece states he only has a bottle of 49-51 currently and that the pharmacy filled 49-51mg  on 7/20 based on the pill bottle she's looking at. Sounds like pharmacy made an error in filling his prescription, still not clear what happened with his dose, but his BP readings have remained elevated so will have pt take his Entresto 97-103mg  BID that he has now, along with spironolactone 50mg  daily. Keeping a close eye on SCr and K with BMET check in 1 week.

## 2022-05-10 ENCOUNTER — Other Ambulatory Visit: Payer: Self-pay | Admitting: Physical Medicine and Rehabilitation

## 2022-05-10 ENCOUNTER — Encounter: Payer: Medicare HMO | Attending: Physical Medicine & Rehabilitation | Admitting: Physical Medicine and Rehabilitation

## 2022-05-10 VITALS — BP 176/79 | HR 67 | Ht 69.0 in | Wt 170.2 lb

## 2022-05-10 DIAGNOSIS — I679 Cerebrovascular disease, unspecified: Secondary | ICD-10-CM | POA: Diagnosis not present

## 2022-05-10 DIAGNOSIS — M25531 Pain in right wrist: Secondary | ICD-10-CM | POA: Insufficient documentation

## 2022-05-10 DIAGNOSIS — M25511 Pain in right shoulder: Secondary | ICD-10-CM | POA: Diagnosis not present

## 2022-05-10 DIAGNOSIS — E119 Type 2 diabetes mellitus without complications: Secondary | ICD-10-CM | POA: Diagnosis present

## 2022-05-10 MED ORDER — DICLOFENAC SODIUM 2 % EX SOLN: 2.0000 | Freq: Two times a day (BID) | CUTANEOUS | 3 refills | Status: DC | PRN

## 2022-05-10 NOTE — Patient Instructions (Signed)
HTN: -Advised checking BP daily at home and logging results to bring into follow-up appointment with PCP and myself. -Reviewed BP meds today.  -Advised regarding healthy foods that can help lower blood pressure and provided with a list: 1) citrus foods- high in vitamins and minerals 2) salmon and other fatty fish - reduces inflammation and oxylipins 3) swiss chard (leafy green)- high level of nitrates 4) pumpkin seeds- one of the best natural sources of magnesium 5) Beans and lentils- high in fiber, magnesium, and potassium 6) Berries- high in flavonoids 7) Amaranth (whole grain, can be cooked similarly to rice and oats)- high in magnesium and fiber 8) Pistachios- even more effective at reducing BP than other nuts 9) Carrots- high in phenolic compounds that relax blood vessels and reduce inflammation 10) Celery- contain phthalides that relax tissues of arterial walls 11) Tomatoes- can also improve cholesterol and reduce risk of heart disease 12) Broccoli- good source of magnesium, calcium, and potassium 13) Greek yogurt: high in potassium and calcium 14) Herbs and spices: Celery seed, cilantro, saffron, lemongrass, black cumin, ginseng, cinnamon, cardamom, sweet basil, and ginger 15) Chia and flax seeds- also help to lower cholesterol and blood sugar 16) Beets- high levels of nitrates that relax blood vessels  17) spinach and bananas- high in potassium  -Provided lise of supplements that can help with hypertension:  1) magnesium: one high quality brand is Bioptemizers since it contains all 7 types of magnesium, otherwise over the counter magnesium gluconate 400mg  is a good option 2) B vitamins 3) vitamin D 4) potassium 5) CoQ10 6) L-arginine 7) Vitamin C 8) Beetroot -Educated that goal BP is 120/80. -Made goal to incorporate some of the above foods into diet.     Foods that reduce pain: 1) Ginger (especially studied for arthritis)- reduce leukotriene production to decrease  inflammation 2) Blueberries- high in phytonutrients that decrease inflammation 3) Salmon- marine omega-3s reduce joint swelling and pain 4) Pumpkin seeds- reduce inflammation 5) dark chocolate- reduces inflammation 6) turmeric- reduces inflammation 7) tart cherries - reduce pain and stiffness 8) extra virgin olive oil - its compound olecanthal helps to block prostaglandins  9) chili peppers- can be eaten or applied topically via capsaicin 10) mint- helpful for headache, muscle aches, joint pain, and itching 11) garlic- reduces inflammation  Link to further information on diet for chronic pain: 

## 2022-05-10 NOTE — Progress Notes (Signed)
Subjective:    Patient ID: Joseph Patrick, male    DOB: Nov 07, 1945, 76 y.o.   MRN: [id number]  HPI 1) RUE pain -fell on right shoulder -jammed wrist -he is putting cream.  -he has bone spurs -he can lift his arm -it hurts in his wrists and fingers.   2) HTN -elevated today to 176/79 -140s, 150s, 160s at home systolic -has been eating cherries and oranges -has been taking his blood pressure medications  Pain Inventory Average Pain 1 Pain Right Now 0 My pain is sharp  In the last 24 hours, has pain interfered with the following? General activity 0 Relation with others 0 Enjoyment of life 5 What TIME of day is your pain at its worst? morning , daytime, evening, and night Sleep (in general) Good  Pain is worse with:  lifting arm Pain improves with:  nothing Relief from Meds:  na  use a cane how many minutes can you walk? 10-15 ability to climb steps?  yes do you drive?  yes  retired  weakness numbness tingling trouble walking  New pt  New pt    Family History  Problem Relation Age of Onset   Heart failure Sister    Social History   Socioeconomic History   Marital status: Single    Spouse name: Not on file   Number of children: Not on file   Years of education: Not on file   Highest education level: Not on file  Occupational History   Not on file  Tobacco Use   Smoking status: Never   Smokeless tobacco: Never  Vaping Use   Vaping Use: Never used  Substance and Sexual Activity   Alcohol use: Never   Drug use: Never   Sexual activity: Not on file  Other Topics Concern   Not on file  Social History Narrative   Not on file   Social Determinants of Health   Financial Resource Strain: Not on file  Food Insecurity: Not on file  Transportation Needs: Not on file  Physical Activity: Not on file  Stress: Not on file  Social Connections: Not on file   No past surgical history on file. Past Medical History:  Diagnosis Date   CVA (cerebral  vascular accident) [facility])    april 2023   Diabetes mellitus without complication (HCC)    Hypertension    BP (!) 176/79   Pulse 67   Ht 5\' 9"  (1.753 m)   Wt 170 lb 3.2 oz (77.2 kg)   SpO2 97%   BMI 25.13 kg/m   Opioid Risk Score:   Fall Risk Score:  `1  Depression screen [facility] 2/9     05/10/2022   10:42 AM  Depression screen PHQ 2/9  Decreased Interest 0  Down, Depressed, Hopeless 0  PHQ - 2 Score 0  Altered sleeping 0  Tired, decreased energy 3  Change in appetite 0  Feeling bad or failure about yourself  0  Trouble concentrating 0  Moving slowly or fidgety/restless 0  Suicidal thoughts 0  PHQ-9 Score 3  Difficult doing work/chores Somewhat difficult      Review of Systems  Musculoskeletal:  Positive for gait problem.  Neurological:  Positive for weakness and numbness.       Tingling  All other systems reviewed and are negative.     Objective:   Physical Exam Gen: no distress, normal appearing HEENT: oral mucosa pink and moist, NCAT Cardio: Reg rate Chest: normal effort, normal rate of  breathing Abd: soft, non-distended Ext: no edema Psych: pleasant, normal affect Skin: intact Neuro: RUE weakness and pain with range of motion    Assessment & Plan:   1) HTN: -BP is 176/79 today.  -Advised checking BP daily at home and logging results to bring into follow-up appointment with PCP and myself. -Reviewed BP meds today.  -Advised regarding healthy foods that can help lower blood pressure and provided with a list: 1) citrus foods- high in vitamins and minerals 2) salmon and other fatty fish - reduces inflammation and oxylipins 3) swiss chard (leafy green)- high level of nitrates 4) pumpkin seeds- one of the best natural sources of magnesium 5) Beans and lentils- high in fiber, magnesium, and potassium 6) Berries- high in flavonoids 7) Amaranth (whole grain, can be cooked similarly to rice and oats)- high in magnesium and fiber 8) Pistachios- even more  effective at reducing BP than other nuts 9) Carrots- high in phenolic compounds that relax blood vessels and reduce inflammation 10) Celery- contain phthalides that relax tissues of arterial walls 11) Tomatoes- can also improve cholesterol and reduce risk of heart disease 12) Broccoli- good source of magnesium, calcium, and potassium 13) Greek yogurt: high in potassium and calcium 14) Herbs and spices: Celery seed, cilantro, saffron, lemongrass, black cumin, ginseng, cinnamon, cardamom, sweet basil, and ginger 15) Chia and flax seeds- also help to lower cholesterol and blood sugar 16) Beets- high levels of nitrates that relax blood vessels  17) spinach and bananas- high in potassium  -Provided lise of supplements that can help with hypertension:  1) magnesium: one high quality brand is Bioptemizers since it contains all 7 types of magnesium, otherwise over the counter magnesium gluconate 400mg  is a good option 2) B vitamins 3) vitamin D 4) potassium 5) CoQ10 6) L-arginine 7) Vitamin C 8) Beetroot -Educated that goal BP is 120/80. -Made goal to incorporate some of the above foods into diet.     2) Right shoulder and wrist pain -MRI and wrist ordered given persistent pain   3) CVA -hold therapies until we get XR results -would benefit from handicap placard to increase mobility in the community -reviewed all medications and provided necessary refills. -discussed vagal nerve stimulation if strength fails to improve in 6 months.   4) Type 2 DM -check HgbA1c

## 2022-05-10 NOTE — Addendum Note (Signed)
Addended by: Horton Chin on: 05/10/2022 01:10 PM   Modules accepted: Orders

## 2022-05-14 ENCOUNTER — Ambulatory Visit: Payer: Medicare HMO | Attending: Cardiovascular Disease

## 2022-05-14 DIAGNOSIS — I5020 Unspecified systolic (congestive) heart failure: Secondary | ICD-10-CM

## 2022-05-14 LAB — HEMOGLOBIN A1C
Est. average glucose Bld gHb Est-mCnc: 174 mg/dL
Hgb A1c MFr Bld: 7.7 % — ABNORMAL HIGH (ref 4.8–5.6)

## 2022-05-14 LAB — BASIC METABOLIC PANEL
BUN/Creatinine Ratio: 19 (ref 10–24)
BUN: 30 mg/dL — ABNORMAL HIGH (ref 8–27)
CO2: 22 mmol/L (ref 20–29)
Calcium: 10.2 mg/dL (ref 8.6–10.2)
Chloride: 103 mmol/L (ref 96–106)
Creatinine, Ser: 1.58 mg/dL — ABNORMAL HIGH (ref 0.76–1.27)
Glucose: 134 mg/dL — ABNORMAL HIGH (ref 70–99)
Potassium: 5 mmol/L (ref 3.5–5.2)
Sodium: 139 mmol/L (ref 134–144)
eGFR: 45 mL/min/{1.73_m2} — ABNORMAL LOW (ref >59)

## 2022-05-15 ENCOUNTER — Telehealth: Payer: Self-pay | Admitting: Pharmacist

## 2022-05-15 NOTE — Telephone Encounter (Addendum)
Called pt Niece. Mailbox full. Pt scr and increased on last labs. Increase is less than 30%. His K is 5. His Entresto was increase to 97/103mg  BID and his spironolactone was also increased to 50mg  daily. Consider decreasing spironolactone to 1.5 tablets 37.5mg  daily or back to 25mg  daily depending on his BP.    I spoke with patient's niece who states that patients blood pressures have been high. She didn't have readings from the past few days. She will call him and get some more readings. Patient fell about a month ago- hurt his wrist and shoulder. Has been in pain and activity has decreased. Is having an MRI soon.   150's/60's but hasn't taken BP today or yesterday Energy level is low. He says he felt better when he was on Vit D. Was previously on 50,000 IU daily. Ok to supplement with OTC 1000-2000 IU. Niece concerned about his appetite and him loosing weight. We discussed protein shakes.   Niece will send me BP readings next week. Will see in office on 9/29. No changes in BP medications right now as just increased medications a week ago and dont have readings for the past 2 days. Will keep close eye on Scr and K.

## 2022-05-15 NOTE — Addendum Note (Signed)
Addended by: Malena Peer D on: 05/15/2022 02:04 PM   Modules accepted: Orders

## 2022-05-15 NOTE — Telephone Encounter (Signed)
Niece called back and stated that patient is urinating very frequently at night time. Will decrease spironolactone to 25mg  daily.

## 2022-05-25 ENCOUNTER — Ambulatory Visit (HOSPITAL_COMMUNITY)
Admission: RE | Admit: 2022-05-25 | Discharge: 2022-05-25 | Disposition: A | Discharge status: Home / Self Care | Payer: Medicare HMO | Source: Ambulatory Visit | Attending: Physical Medicine and Rehabilitation | Admitting: Physical Medicine and Rehabilitation

## 2022-05-25 DIAGNOSIS — M25531 Pain in right wrist: Secondary | ICD-10-CM

## 2022-05-25 DIAGNOSIS — M25511 Pain in right shoulder: Secondary | ICD-10-CM | POA: Insufficient documentation

## 2022-05-28 ENCOUNTER — Encounter: Payer: Medicare HMO | Attending: Physical Medicine & Rehabilitation | Admitting: Physical Medicine and Rehabilitation

## 2022-05-28 ENCOUNTER — Encounter: Payer: Self-pay | Admitting: Physical Medicine and Rehabilitation

## 2022-05-28 DIAGNOSIS — M67929 Unspecified disorder of synovium and tendon, unspecified upper arm: Secondary | ICD-10-CM | POA: Insufficient documentation

## 2022-05-28 NOTE — Progress Notes (Signed)
Subjective:    Patient ID: Joseph Patrick, male    DOB: 1945-12-24, 76 y.o.   MRN: [id number]  HPI An audio/video tele-health visit is felt to be the most appropriate encounter for this patient at this time. This is a follow up tele-visit via phone. The patient is at home. MD is at office. Prior to scheduling this appointment, our staff discussed the limitations of evaluation and management by telemedicine and the availability of in-person appointments. The patient expressed understanding and agreed to proceed.   1) RUE pain -fell on right shoulder -I called patient's niece to discuss MRI results and possible treatment options -he prefers to try extracorporeal shockwave therapy rather than surgical intervention at this time.  -jammed wrist -he is putting cream.  -he has bone spurs -he can lift his arm -it hurts in his wrists and fingers.   2) HTN -elevated today to 176/79 -140s, 150s, 160s at home systolic -has been eating cherries and oranges -has been taking his blood pressure medications  Pain Inventory Average Pain 1 Pain Right Now 0 My pain is sharp  In the last 24 hours, has pain interfered with the following? General activity 0 Relation with others 0 Enjoyment of life 5 What TIME of day is your pain at its worst? morning , daytime, evening, and night Sleep (in general) Good  Pain is worse with:  lifting arm Pain improves with:  nothing Relief from Meds:  na  use a cane how many minutes can you walk? 10-15 ability to climb steps?  yes do you drive?  yes  retired  weakness numbness tingling trouble walking  New pt  New pt    Family History  Problem Relation Age of Onset   Heart failure Sister    Social History   Socioeconomic History   Marital status: Single    Spouse name: Not on file   Number of children: Not on file   Years of education: Not on file   Highest education level: Not on file  Occupational History   Not on file  Tobacco Use    Smoking status: Never   Smokeless tobacco: Never  Vaping Use   Vaping Use: Never used  Substance and Sexual Activity   Alcohol use: Never   Drug use: Never   Sexual activity: Not on file  Other Topics Concern   Not on file  Social History Narrative   Not on file   Social Determinants of Health   Financial Resource Strain: Not on file  Food Insecurity: Not on file  Transportation Needs: Not on file  Physical Activity: Not on file  Stress: Not on file  Social Connections: Not on file   No past surgical history on file. Past Medical History:  Diagnosis Date   CVA (cerebral vascular accident) [facility])    april 2023   Diabetes mellitus without complication (HCC)    Hypertension    There were no vitals taken for this visit.  Opioid Risk Score:   Fall Risk Score:  `1  Depression screen [facility] 2/9     05/10/2022   10:42 AM  Depression screen PHQ 2/9  Decreased Interest 0  Down, Depressed, Hopeless 0  PHQ - 2 Score 0  Altered sleeping 0  Tired, decreased energy 3  Change in appetite 0  Feeling bad or failure about yourself  0  Trouble concentrating 0  Moving slowly or fidgety/restless 0  Suicidal thoughts 0  PHQ-9 Score 3  Difficult doing work/chores Somewhat  difficult      Review of Systems  Musculoskeletal:  Positive for gait problem.  Neurological:  Positive for weakness and numbness.       Tingling  All other systems reviewed and are negative.      Objective:   Physical Exam Gen: no distress, normal appearing HEENT: oral mucosa pink and moist, NCAT Cardio: Reg rate Chest: normal effort, normal rate of breathing Abd: soft, non-distended Ext: no edema Psych: pleasant, normal affect Skin: intact Neuro: RUE weakness and pain with range of motion    Assessment & Plan:   1) HTN: -BP was 176/79 last visit.  -Advised checking BP daily at home and logging results to bring into follow-up appointment with PCP and myself. -Reviewed BP meds today.  -Advised  regarding healthy foods that can help lower blood pressure and provided with a list: 1) citrus foods- high in vitamins and minerals 2) salmon and other fatty fish - reduces inflammation and oxylipins 3) swiss chard (leafy green)- high level of nitrates 4) pumpkin seeds- one of the best natural sources of magnesium 5) Beans and lentils- high in fiber, magnesium, and potassium 6) Berries- high in flavonoids 7) Amaranth (whole grain, can be cooked similarly to rice and oats)- high in magnesium and fiber 8) Pistachios- even more effective at reducing BP than other nuts 9) Carrots- high in phenolic compounds that relax blood vessels and reduce inflammation 10) Celery- contain phthalides that relax tissues of arterial walls 11) Tomatoes- can also improve cholesterol and reduce risk of heart disease 12) Broccoli- good source of magnesium, calcium, and potassium 13) Greek yogurt: high in potassium and calcium 14) Herbs and spices: Celery seed, cilantro, saffron, lemongrass, black cumin, ginseng, cinnamon, cardamom, sweet basil, and ginger 15) Chia and flax seeds- also help to lower cholesterol and blood sugar 16) Beets- high levels of nitrates that relax blood vessels  17) spinach and bananas- high in potassium  -Provided lise of supplements that can help with hypertension:  1) magnesium: one high quality brand is Bioptemizers since it contains all 7 types of magnesium, otherwise over the counter magnesium gluconate 400mg  is a good option 2) B vitamins 3) vitamin D 4) potassium 5) CoQ10 6) L-arginine 7) Vitamin C 8) Beetroot -Educated that goal BP is 120/80. -Made goal to incorporate some of the above foods into diet.     2) Right shoulder and wrist pain -MRI and wrist ordered given persistent pain and shows significant tears in both shoulder and wrist, discussed with patient and niece, as well as biceps tendinopathy. Discussed extracorporeal shockwave therapy as a conservative treatment  option, referral to PT, and referral for surgical evaluation.  3) CVA -hold therapies until we get XR results -would benefit from handicap placard to increase mobility in the community -reviewed all medications and provided necessary refills. -discussed vagal nerve stimulation if strength fails to improve in 6 months.   4) Type 2 DM -check HgbA1c  8 minutes spent in discussing MRI results, surgical referral, extracorporeal shockwave therapy, physical therapy.

## 2022-06-04 ENCOUNTER — Encounter: Payer: Medicare HMO | Admitting: Physical Medicine and Rehabilitation

## 2022-06-04 ENCOUNTER — Encounter: Payer: Self-pay | Admitting: Physical Medicine and Rehabilitation

## 2022-06-04 VITALS — BP 159/73 | HR 72 | Ht 69.0 in | Wt 169.0 lb

## 2022-06-04 DIAGNOSIS — M67929 Unspecified disorder of synovium and tendon, unspecified upper arm: Secondary | ICD-10-CM

## 2022-06-04 NOTE — Patient Instructions (Signed)
Provided with list of supplements that can help with dyslipidemia: 1) Vitamin B3 500-4,000mg in divided doses daily (would recommend starting low as can cause uncomfortable facial flushing if started at too high a dose) 2) Phytosterols 2.15 grams daily 3) Fermented soy 30-50 grams daily 4) EGCG (found in green tea): 500-1000mg daily 5) Omega-3 fatty acids 3000-5,000mg daily 6) Flax seed 40 grams daily 7) Monounsaturated fats 20-40 grams daily (olives, olive oil, nuts), also reduces cardiovascular disease 8) Sesame: 40 grams daily 9) Gamma/delta tocotrienols- a family of unsaturated forms of Vitamin E- 200mg with dinner 10) Pantethine 900mg daily in divided doses 11) Resveratrol 250mg daily 12) N Acetyl Cysteine 2000mg daily in divided doses 13) Curcumin 2000-5000mg in divided doses daily 14) Pomegranate juice: 8 ounces daily, also helps to lower blood pressure 15) Pomegranate seeds one cup daily, also helps to lower blood pressure 16) Citrus Bergamot 1000mg daily, also helps with glucose control and weight loss 17) Vitamin C 500mg daily 18) Quercetin 500-1000mg daily 19) Glutathione 20) Probiotics 60-100 billion organisms per day 21) Fiber 22) Oats 23) Aged garlic (can eat as food or supplement of 600-900mg per day) 24) Chia seeds 25 grams per day 25) Lycopene- carotenoid found in high concentrations in tomatoes. 26) Alpha linolenic acid 27) Flavonoids and anthocyanins 28) Wogonin- flavanoid that enhances reverse cholesterol transport 29) Coenzyme Q10 30) Pantethine- derivative of Vitamin B5: 300mg three times per day or 450mg twice per day with or without food 31) Barley and other whole grains 32) Orange juice 33) L- carnitine 34) L- Lysine 35) L- Arginine 36) Almonds 37) Morin 38) Rutin 39) Carnosine 40) Histidine  41) Kaempferol  42) Organosulfur compounds 43) Vitamin E 44) Oleic acid 45) RBO (ferulic acid gammaoryzanol) 46) grape seed extract 47) Red wine 48)  Berberine HCL 500mg daily or twice per day- more effective and with fewer adverse effects that ezetimibe monotherapy 49) red yeast rice 2400- 4800 mg/day 50) chlorella 51) Licorice  

## 2022-06-04 NOTE — Progress Notes (Signed)
Biceps tendinopathy  The risks and benefits of extracoporeal shock wave therapy were discussed with patient and consent form was signed.   The appropriate setting of "biceps tendinopathy" was selected and the shock wave therapy was administered to the patient's right biceps tendinopathy until a current of 2,000 was achieved.  The patient tolerated the procedure well without side effects.  Post-procedure instructions were given.    5-10 Hz Power level 60-124mJ Shocks 2000

## 2022-06-05 ENCOUNTER — Ambulatory Visit: Payer: Medicare HMO | Admitting: Orthopaedic Surgery

## 2022-06-05 ENCOUNTER — Encounter: Payer: Self-pay | Admitting: Orthopaedic Surgery

## 2022-06-05 DIAGNOSIS — M25531 Pain in right wrist: Secondary | ICD-10-CM | POA: Diagnosis not present

## 2022-06-05 DIAGNOSIS — M25511 Pain in right shoulder: Secondary | ICD-10-CM | POA: Diagnosis not present

## 2022-06-05 DIAGNOSIS — G8929 Other chronic pain: Secondary | ICD-10-CM | POA: Diagnosis not present

## 2022-06-05 NOTE — Progress Notes (Signed)
Office Visit Note   Patient: Joseph Patrick           Date of Birth: 12-05-1945           MRN: [id number] Visit Date: 06/05/2022              Requested by: Izora Ribas, MD [phone] [place],  [place] [address] PCP: Arthur Holms, NP   Assessment & Plan: Visit Diagnoses:  1. Chronic right shoulder pain   2. 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  In regards to the right wrist, have offered him cortisone injection today.  Have also discussed continuing with physical therapy.  He has politely declined injection as he is getting pollens treatment and has been advised to not take any anti-inflammatories.  In regards to the right shoulder, also discussed glenohumeral cortisone injection as well as physical therapy.  He would like to hold off on the injection for now.  Prescription was provided for incorporating range of motion and strengthening exercises to his current PT regimen.  He will follow-up with [place] as needed.  Follow-Up Instructions: Return if symptoms worsen or fail to improve.   Orders:  No orders of the defined types were placed in this encounter.  No orders of the defined types were placed in this encounter.     Procedures: No procedures performed   Clinical Data: No additional findings.   Subjective: Chief Complaint  Patient presents with   Right Wrist - Pain   Right Shoulder - Pain    HPI patient is a pleasant 76 year old gentleman who comes in today with right shoulder and right wrist pain.  He sustained a CVA back in April where he had right-sided hemiparesis which did improve to about 80% with physical therapy.  He was doing well until about 2 months ago when he fell landing on his right side with his right wrist  hyperflexed.  He had increased pain and limitation with range of motion of both the shoulder and wrist following this injury.  In regards to the right shoulder, the pain he has is to the deltoid.  Any movement of the shoulder worsens his symptoms.  He is not taking any medication for this.  He does note slight tingling to his hand.  He has recently been to physical therapy where they have done a pons treatment which she thinks is helped with the pain.  He has been told not to take any anti-inflammatories while undergoing this treatment.  Of note he underwent right shoulder MRI which shows a complete supraspinatus and near complete subscapularis tendon tear with retraction to the glenohumeral joint.  He also has severe biceps tendinopathy and moderate glenohumeral osteoarthritis.  In regards to the right wrist, he really denies pain but more of a tight feeling especially when making a fist.  He did undergo an MRI of the right wrist as well which showed a tear of the scapholunate ligament in addition to perforation of the TFCC.  Review of Systems as detailed in HPI.  All others reviewed and are negative.   Objective: Vital Signs: There were no vitals taken for this visit.  Physical Exam well-developed well-nourished gentleman in no acute distress.  Alert and oriented x3.  Ortho Exam right shoulder exam  reveals forward flexion to approximately 80 degrees.  I can passively get him to about 100 degrees without him resisting me due to pain.  He has 3 out of 5 strength throughout.  Right hand exam reveals slight swelling.  He is unable to make full composite fist.  He does not have any tenderness to the TFCC and has a negative compression test.  Slight limitation with flexion and extension of the wrist.  He is neurovascular tact distally  Specialty Comments:  No specialty comments available.  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)    Hypertension     Family History  Problem Relation Age of Onset   Heart failure Sister     History reviewed. No pertinent surgical history. Social History   Occupational History   Not on file  Tobacco Use   Smoking status: Never   Smokeless tobacco: Never  Vaping Use   Vaping Use: Never used  Substance and Sexual Activity   Alcohol use: Never   Drug use: Never   Sexual activity: Not on file

## 2022-06-07 ENCOUNTER — Ambulatory Visit: Payer: Medicare HMO | Attending: Cardiovascular Disease | Admitting: Pharmacist

## 2022-06-07 VITALS — BP 132/68 | HR 78

## 2022-06-07 DIAGNOSIS — I10 Essential (primary) hypertension: Secondary | ICD-10-CM | POA: Diagnosis not present

## 2022-06-07 NOTE — Progress Notes (Signed)
Patient ID: Joseph Patrick                 DOB: 11-Apr-1946                      MRN: [id number]      HPI: Joseph Patrick is a 76 y.o. male patient of Dr Johnsie Cancel referred to HTN clinic by Richardson Dopp, PA. PMH is significant for HFrEF with EF 25-30% on 12/2021 echo, HTN, CVA in 12/2021 (BP 260/120s at the time), renal artery ultrasound 02/2022 with 1-59% L stenosis, HLD, DM, and CKD. Last seen by Nicki Reaper on 03/16/22 where BP was 160/80 and carvedilol was increased to 18.75mg  BID.  Seen 7/26 in PharmD clinic. Carvedilol was increased to 25mg  twice a day. Follow up phone call 8/22 revealed BP in the 332-951'O systolic. Spironolactone was increased to 50mg  daily. Repeat BMP showed scr of 1.58 and K of 5 (baseline scr ~1.27). Spironolactone was decreased to 25mg  daily.   Patient presents today to HTN clinic for follow up. He I accompanied by his niece. His only complaint is fatigue. Feels like he doesn't have any energy at times. Thinks its from his medications, but then says it started after his fall. He states his blood pressure at home is 140-150. Checks it every few days. His cuff was checked at last visit and was running high. He started taking his evening medications with just a sip of water instead of 16oz of water. The last few nights, he is not urinating as much at night.  Home cuff - 160/67, recheck 152/61 Clinic cuff - 140/62 x 2  Current HTN/HF meds: carvedilol 25 mg BID, Farxiga 10mg  daily, Entresto 97-103mg  BID, spironolactone 25mg  daily, hydralazine 100mg  TID (6am, 12pm, 7-9pm), Imdur 120mg  daily Previously tried: amlodipine - itching BP goal: <130/38mmHg  Family History: HF in his sister.  Social History: Denies tobacco, alcohol and drug use.  Diet: Likes [place] and chicken. Avoids salt. Has been watching portions. No caffeine. Mostly drinks water. Has made a lot of dietary improvements since his stroke.  Exercise: Stairs at home, elliptical.  Home BP readings: 140-150s/60-70s  Wt  Readings from Last 3 Encounters:  06/04/22 169 lb (76.7 kg)  05/10/22 170 lb 3.2 oz (77.2 kg)  05/08/22 171 lb (77.6 kg)   BP Readings from Last 3 Encounters:  06/04/22 (!) 159/73  05/10/22 (!) 176/79  05/08/22 (!) 157/79   Pulse Readings from Last 3 Encounters:  06/04/22 72  05/10/22 67  05/08/22 72    Renal function: CrCl cannot be calculated (Patient's most recent lab result is older than the maximum 21 days allowed.).  Past Medical History:  Diagnosis Date   CVA (cerebral vascular accident) [facility])    april 2023   Diabetes mellitus without complication ([place])    Hypertension     Current Outpatient Medications on File Prior to Visit  Medication Sig Dispense Refill   aspirin EC 81 MG tablet Take 1 tablet (81 mg total) by mouth daily. Swallow whole. 90 tablet 3   atorvastatin (LIPITOR) 40 MG tablet Take 1 tablet (40 mg total) by mouth daily. 90 tablet 1   carvedilol (COREG) 25 MG tablet Take 1 tablet (25 mg total) by mouth 2 (two) times daily. 180 tablet 3   Cholecalciferol (VITAMIN D3 PO) Take 1 capsule by mouth daily.     diclofenac Sodium (PENNSAID) 2 % SOLN APPLY 2 PUMP (40 MG TOTAL) TOPICALLY 2 (TWO) TIMES DAILY AS NEEDED. [place]  g 3   hydrALAZINE (APRESOLINE) 100 MG tablet TAKE 1 TABLET BY MOUTH 3 TIMES DAILY. 270 tablet 3   isosorbide mononitrate (IMDUR) 120 MG 24 hr tablet Take 1 tablet (120 mg total) by mouth daily. 90 tablet 1   lidocaine (LIDODERM) 5 % Place 1 patch onto the skin daily. Remove & Discard patch within 12 hours or as directed by MD 30 patch 0   sacubitril-valsartan (ENTRESTO) 97-103 MG Take 1 tablet by mouth 2 (two) times daily. 180 tablet 3   spironolactone (ALDACTONE) 50 MG tablet Take 0.5 tablets (25 mg total) by mouth daily. 30 tablet 5   No current facility-administered medications on file prior to visit.    Allergies  Allergen Reactions   Amlodipine Itching     Assessment/Plan:  1. Hypertension/HFrEF - BP improving and close to goal <  130/9mmHg in clinic today. Im not sure home readings are accurate. Patient also not waiting 5 min after placing cuff. His home cuff was found to run about 12 points higher. Therefore his 140-150 is most likely closer to 128-138. Patient complains of fatigue that could be due to medications, but also could be from his stroke or his fall (tore ligaments in his arm). His last BMP showed an increase is scr and K of 5. Spironolactone was decreased to 25mg  due to labs and complaints of frequent urination at night. I think the urination was more due to drinking a large amount of water with evening medications. We are very limited in medication options. Cannot use amlodipine due to itching. I think scr and K will limit spironolactone dose. He is maxed out on carvedilol, Entresto and hydralazine. He already complains of sleepiness so clonidine is not ideal nor is doxazosin due to ortho stasis possibility and he already walks with cane. May need to accept a BP of <140 systolic. Will follow up with BMP. Pt will see Dr. 10/10. We will see him back after that if needed.  Thank you,  Eden Emms, Pharm.D, BCPS, CPP [facility] A Division of [facility] [address] [place], [place]  Phone: [phone]; Fax: [phone]   06/07/2022 8:28 AM

## 2022-06-07 NOTE — Patient Instructions (Addendum)
Gerald Stabs will call you tomorrow with lab results. Continue taking carvedilol 25 mg BID, Farxiga 10mg  daily, Entresto 97-103mg  BID, spironolactone 25mg  daily, hydralazine 100mg  TID (6am, 12pm, 7-9pm), Imdur 120mg  daily Make sure you are resting 5 min or more after you put your cuff on before you check your blood pressure Consider getting a new blood pressure cuff

## 2022-06-08 ENCOUNTER — Telehealth: Payer: Self-pay | Admitting: Pharmacist

## 2022-06-08 LAB — BASIC METABOLIC PANEL
BUN/Creatinine Ratio: 17 (ref 10–24)
BUN: 24 mg/dL (ref 8–27)
CO2: 23 mmol/L (ref 20–29)
Calcium: 9.3 mg/dL (ref 8.6–10.2)
Chloride: 101 mmol/L (ref 96–106)
Creatinine, Ser: 1.42 mg/dL — ABNORMAL HIGH (ref 0.76–1.27)
Glucose: 162 mg/dL — ABNORMAL HIGH (ref 70–99)
Potassium: 4.2 mmol/L (ref 3.5–5.2)
Sodium: 138 mmol/L (ref 134–144)
eGFR: 51 mL/min/{1.73_m2} — ABNORMAL LOW (ref >59)

## 2022-06-08 NOTE — Telephone Encounter (Signed)
Pam had called patient with lab results. Attempted to call patient to make sure he knew that there was no needed med changes.  No answer.

## 2022-06-15 ENCOUNTER — Encounter: Payer: Medicare HMO | Admitting: Physical Medicine and Rehabilitation

## 2022-06-15 VITALS — BP 158/72 | HR 75 | Ht 69.0 in | Wt 170.8 lb

## 2022-06-15 DIAGNOSIS — M67929 Unspecified disorder of synovium and tendon, unspecified upper arm: Secondary | ICD-10-CM

## 2022-06-15 NOTE — Progress Notes (Signed)
Biceps tendinopathy, send session  The risks and benefits of extracoporeal shock wave therapy were discussed with patient and consent form was signed.   The appropriate setting of "biceps tendinopathy" was selected and the shock wave therapy was administered to the patient's right biceps tendinopathy until a current of 2,000 was achieved.  The patient tolerated the procedure well without side effects.  Post-procedure instructions were given.    5-10 Hz Power level 60-19mJ Shocks 2000

## 2022-06-18 NOTE — Therapy (Signed)
OUTPATIENT PHYSICAL THERAPY EVALUATION   Patient Name: Joseph Patrick MRN: [id number] DOB:November 05, 1945, 76 y.o., male Today's Date: 06/19/2022   PT End of Session - 06/19/22 1357     Visit Number 1    Number of Visits 13    Date for PT Re-Evaluation 07/31/22    Authorization Type [employer] MCR    Progress Note Due on Visit 10    PT Start Time 1132    PT Stop Time 1215    PT Time Calculation (min) 43 min    Activity Tolerance Patient tolerated treatment well    Behavior During Therapy [facility] for tasks assessed/performed             Past Medical History:  Diagnosis Date   CVA (cerebral vascular accident) [facility])    april 2023   Diabetes mellitus without complication ([place])    Hypertension    History reviewed. No pertinent surgical history. 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 ([place]) 01/14/2022   HFrEF (heart failure with reduced ejection fraction) ([place])    Essential hypertension 01/07/2022   Hyperlipidemia 01/07/2022   Diabetes ([place]) 01/07/2022   Stage 3a chronic kidney disease (CKD) ([place]) 01/07/2022   Transient cerebral ischemia 01/06/2022    PCP: Arthur Holms, NP  REFERRING PROVIDER: Aundra Dubin, PA-C  REFERRING DIAG: Chronic right shoulder pain  THERAPY DIAG:  Chronic right shoulder pain  Muscle weakness (generalized)  Rationale for Evaluation and Treatment Rehabilitation  ONSET DATE: 04/29/2022   SUBJECTIVE:       SUBJECTIVE STATEMENT: Patient reports stroke in April of 2023 that effected right side, he did rehab and was able to gain function of left arm and leg back. Then in August 2023 he fell on the right side and injured right shoulder and wrist. Since that time he has not been able to raise right arm overhead because of torn ligaments in shoulder, and he has been trying to avoid doing anything with the right arm to let  it heal. He notes he has swelling and difficulty making a fist with the right wrist/hand due to tightness. He denies much pain other than when using the right arm or lifting the right shoulder. He does report pain at night that will wake him up.  PERTINENT HISTORY: CVA April 2023 resulting in right sided weakness  PAIN:  Are you having pain? Yes:  NPRS scale: 2/10 Pain location: Right shoulder Pain description: Dull Aggravating factors: Moving the shoulder, occasionally laying on right side Relieving factors: Rest  PRECAUTIONS: Fall  WEIGHT BEARING RESTRICTIONS No  FALLS:  Has patient fallen in last 6 months? Yes. 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 No fatty replacement is present but there is edema in the supraspinatus and infraspinatus muscle bellies compatible early atrophy. Severe tendinopathy of the intra-articular long head of biceps and infraspinatus. Bulky acromioclavicular osteoarthritis and subacromial spurring. Moderate glenohumeral osteoarthritis. Debris containing fluid in the subacromial/subdeltoid bursa.  PATIENT SURVEYS:  Not assessed  COGNITION:  Overall cognitive status: Within functional limits for tasks assessed     SENSATION: WFL  POSTURE: Slight  rounded shoulders  UPPER EXTREMITY ROM:   Active ROM Right eval Left eval  Shoulder flexion 90 -  Shoulder extension 30 -  Shoulder abduction 80 -  Shoulder internal rotation Unable to reach behind back -  Shoulder external rotation Unable to reach behind head -    Patient also demonstrates right elbow extension limitation  UPPER EXTREMITY  MMT: Strength not formally assessed for right shoulder, he exhibits significant strength deficit of the right shoulder with inability to hold against resistance with shoulder external rotation or elevation due to weakness  JOINT MOBILITY TESTING:  Hypomoblity of the right GHJ  PALPATION:  Non tender to palpation right shoulder    TODAY'S TREATMENT:  Supine dowel shoulder flexion x 10 Supine dowel serratus punch x 10 Seated cane shoulder ER x 10 Seated table slide shoulder flexion x 10  PATIENT EDUCATION: Education details: Exam findings, POC, HEP, OT referral for right wrist and hand weakness and swelling, once finished with PT for shoulder to reach out to Neuro doctor for referral to neuro PT for right LE Person educated: Patient Education method: Explanation, Demonstration, Tactile cues, Verbal cues, and Handouts Education comprehension: verbalized understanding, returned demonstration, verbal cues required, tactile cues required, and needs further education  HOME EXERCISE PROGRAM: Access Code: [email or web address]   ASSESSMENT: CLINICAL IMPRESSION: Patient is a 76 y.o. male who was seen today for physical therapy evaluation and treatment for right shoulder pain, weakness, and limitation in mobility. He does have an MRI confirmed rotator cuff tear and he demonstrates limitations in both active and passive range of motion, significant strength deficits of the right shoulder, and limitations with performing tasks that include reaching overheard or behind his back.    OBJECTIVE IMPAIRMENTS decreased activity tolerance, decreased ROM, decreased strength, postural dysfunction, and pain.   ACTIVITY LIMITATIONS carrying, lifting, dressing, reach over head, and hygiene/grooming  PARTICIPATION LIMITATIONS: meal prep, cleaning, shopping, community activity, and yard work  [place], Past/current experiences, Time since onset of injury/illness/exacerbation, and 3+ comorbidities: see  above  are also affecting patient's functional outcome.   REHAB POTENTIAL: Good  CLINICAL DECISION MAKING: Evolving/moderate complexity  EVALUATION COMPLEXITY: Moderate   GOALS: Goals reviewed with patient? Yes  SHORT TERM GOALS: Target date: 07/10/2022   Patient will be I with initial HEP in order to progress with therapy. Baseline: HEP provided at eval Goal status: INITIAL  2.  Patient will report 50% improvement in pain at night in order to improve sleeping ability Baseline: patient reports pain will wake him on a nightly basis Goal status: INITIAL  LONG TERM GOALS: Target date: 07/31/2022  Patient will be I with final HEP to maintain progress from PT. Baseline: HEP provided at eval Goal status: INITIAL  2.  Patient will demonstrate right shoulder active elevation >/= 120 deg in order to improve functional use of right arm with overhead tasks and self care  Baseline: 90 deg shoulder flexion Goal status: INITIAL  3.  Patient will report </= 1/10 pain level with all activities using the right arm to reduce functional limitations Baseline: 2/10 pain Goal status: INITIAL  4.  Patient will report no more than minimal limitation with performing household activities, dressing, and all self care tasks in order to indicate improved functional ability Baseline: patient reports inability to perform some household and seld care tasks Goal status: INITIAL   PLAN: PT FREQUENCY: 1-2x/week  PT DURATION: 6 weeks  PLANNED INTERVENTIONS: Therapeutic exercises, Therapeutic activity, Neuromuscular re-education, Balance training, Gait training, Patient/Family  education, Self Care, Joint mobilization, Joint manipulation, Aquatic Therapy, Dry Needling, Electrical stimulation, Cryotherapy, Moist heat, Taping, Manual therapy, and Re-evaluation  PLAN FOR NEXT SESSION: Review HEP and progress PRN, right shoulder mobs/PROM, progress AAROM to AROM as tolerated, progress shoulder and parascapular  strengthening as tolerated   Hilda Blades, PT, DPT, LAT, ATC 06/20/22  8:25 AM Phone: [phone] Fax: [phone]   Referring diagnosis? M25.511 Treatment diagnosis? (if different than referring diagnosis) M25.511 What was this (referring dx) caused by? []  Surgery [x]  Fall []  Ongoing issue []  Arthritis []  Other: ____________  Laterality: [x]  Rt []  Lt []  Both  Check all possible CPT codes:  *CHOOSE 10 OR LESS*    []  97110 (Therapeutic Exercise)  []  92507 (SLP Treatment)  []  97112 (Neuro Re-ed)   []  92526 (Swallowing Treatment)   []  [id number] (Gait Training)   []  [id number] (Cognitive Training, 1st 15 minutes) []  97140 (Manual Therapy)   []  13086 (Cognitive Training, each add'l 15 minutes)  []  97164 (Re-evaluation)                              []  Other, List CPT Code ____________  []  97530 (Therapeutic Activities)     []  97535 (Self Care)   [x]  All codes above (97110 - 97535)  []  97012 (Mechanical Traction)  [x]  97014 (E-stim Unattended)  []  97032 (E-stim manual)  [x]  97033 (Ionto)  []  97035 (Ultrasound) []  97750 (Physical Performance Training) [x]  [id number] (Aquatic Therapy) []  97016 (Vasopneumatic Device) []  [id number] (Paraffin) []  97034 (Contrast Bath) []  97597 (Wound Care 1st 20 sq cm) []  97598 (Wound Care each add'l 20 sq cm) []  97760 (Orthotic Fabrication, Fitting, Training Initial) []  [id number] (Prosthetic Management and Training Initial) []  57846 (Orthotic or Prosthetic Training/ Modification Subsequent)

## 2022-06-19 ENCOUNTER — Other Ambulatory Visit: Payer: Self-pay

## 2022-06-19 ENCOUNTER — Encounter: Payer: Self-pay | Admitting: Physical Therapy

## 2022-06-19 ENCOUNTER — Ambulatory Visit: Payer: Medicare HMO | Attending: Physician Assistant | Admitting: Physical Therapy

## 2022-06-19 DIAGNOSIS — R600 Localized edema: Secondary | ICD-10-CM | POA: Diagnosis present

## 2022-06-19 DIAGNOSIS — G8929 Other chronic pain: Secondary | ICD-10-CM | POA: Insufficient documentation

## 2022-06-19 DIAGNOSIS — M25511 Pain in right shoulder: Secondary | ICD-10-CM | POA: Insufficient documentation

## 2022-06-19 DIAGNOSIS — I63512 Cerebral infarction due to unspecified occlusion or stenosis of left middle cerebral artery: Secondary | ICD-10-CM | POA: Diagnosis present

## 2022-06-19 DIAGNOSIS — M25631 Stiffness of right wrist, not elsewhere classified: Secondary | ICD-10-CM | POA: Insufficient documentation

## 2022-06-19 DIAGNOSIS — M6281 Muscle weakness (generalized): Secondary | ICD-10-CM | POA: Diagnosis present

## 2022-06-19 NOTE — Patient Instructions (Signed)
Access Code: [email or web address] URL: [email or web address] Date: 06/19/2022 Prepared by: Hilda Blades  Exercises - Supine Shoulder Flexion Extension AAROM with Dowel  - 2 x daily - 2 sets - 10 reps - 5 seconds hold - Supine Shoulder Protraction with Dowel  - 2 x daily - 2 sets - 10 reps - Seated Shoulder External Rotation AAROM with Cane and Hand in Neutral  - 2 x daily - 2 sets - 10 reps - Seated Shoulder Flexion Towel Slide at Table Top  - 2 x daily - 2 sets - 10 reps - 5 seconds hold

## 2022-06-20 NOTE — Progress Notes (Deleted)
Cardiology Office Note:    Date:  06/20/2022   ID:  Joseph Patrick, DOB 1946/03/06, MRN [id number]  PCP:  Arthur Holms, NP  [place] [facility] Cardiologist:  Jenkins Rouge, MD    Referring MD: Arthur Holms, NP   Chief Complaint:  No chief complaint on file.    Patient Profile: (HFrEF) heart failure with reduced ejection fraction  Echocardiogram 4/23: EF 25-30, coarse trabeculation apex - no clot Mitral regurgitation  Hx of CVA in April 2023  Admit with L frontoparietal CVA Readmit with stuttering lacunar CVA Hypertension  Difficult to control  Admx w stroke in 12/2021>>BP 260/120s Renal artery [place] 02/2022: L1-59, R no stenosis Hyperlipidemia  Diabetes mellitus  Chronic kidney disease  Prior CV Studies: Renal artery ultrasound 03-27-22 No evidence of renal artery stenosis on the right; 1-59% stenosis in the left renal artery Normal celiac artery and SMA  Echocardiogram 01/07/2022 Coarse apical trabeculation, no obvious thrombus; EF 25-30, global HK, mild LVH, GRII DD, normal RVSF, moderate LAE, moderate MR  History of Present Illness:   Joseph Patrick is a 76 y.o. male with the above problem list.  BP labile.  Renal arterial ultrasound no RAS Cardiac PET was ordered by Richardson Dopp but never done since June  He returns for follow-up.   He is here with his wife.  He continues to do well without chest pain, shortness of breath, syncope, orthopnea, leg edema.  He still has right-sided weakness.  This is improving but he is concerned about his hand weakness from stroke     Past Medical History:  Diagnosis Date   CVA (cerebral vascular accident) [facility])    april 2023   Diabetes mellitus without complication ([place])    Hypertension    Current Medications: No outpatient medications have been marked as taking for the 06/26/22 encounter (Appointment) with Josue Hector, MD.    Allergies:   Amlodipine   Social History   Tobacco Use   Smoking status: Never   Smokeless  tobacco: Never  Vaping Use   Vaping Use: Never used  Substance Use Topics   Alcohol use: Never   Drug use: Never    Family Hx: The patient's family history includes Heart failure in his sister.  Review of Systems  Gastrointestinal:  Negative for hematochezia.  Genitourinary:  Negative for hematuria.     EKGs/Labs/Other Test Reviewed:    EKG:  EKG is not ordered today.  The ekg ordered today demonstrates n/a  Recent Labs: 01/15/2022: TSH 0.854 02/01/2022: Magnesium 2.3 02/14/2022: ALT 38 02/15/2022: Hemoglobin 13.7; Platelets 189 06/07/2022: BUN 24; Creatinine, Ser 1.42; Potassium 4.2; Sodium 138   Recent Lipid Panel Recent Labs    01/07/22 0551  CHOL 186  TRIG 82  HDL 43  VLDL 16  LDLCALC 127*      Risk Assessment/Calculations/Metrics:         No BP recorded.  {Refresh Note OR Click here to enter BP  :1}***     Physical Exam:    VS:  There were no vitals taken for this visit.    Wt Readings from Last 3 Encounters:  06/15/22 170 lb 12.8 oz (77.5 kg)  06/04/22 169 lb (76.7 kg)  05/10/22 170 lb 3.2 oz (77.2 kg)    Affect appropriate Chronically ill male  HEENT: normal Neck supple with no adenopathy JVP normal no bruits no thyromegaly Lungs clear with no wheezing and good diaphragmatic motion Heart:  S1/S2 no murmur, no rub, gallop or click PMI  normal Abdomen: benighn, BS positve, no tenderness, no AAA no bruit.  No HSM or HJR Distal pulses intact with no bruits No edema Neuro non-focal Skin warm and dry Right partial hemiparesis worse in hand Walks with cane        ASSESSMENT & PLAN:    Essential hypertension  Labile, ? Allergy to norvasc Continue Entresto, imdur, hydralazine and aldactone as well as coreg Renal duplex negative for RAS    HFrEF (heart failure with reduced ejection fraction) (HCC) NYHA I-II.  EF 25-30.  Volume status currently stable.  Adjust carvedilol to 18.7 mg twice daily as noted.  Continue Farxiga 10 mg daily, hydralazine 100  mg 3 times a day, isosorbide mononitrate 120 mg daily, Entresto 97/103 mg twice daily, spironolactone 25 mg daily. Latter dose decreased due to elevated K/Cr   Dilated cardiomyopathy ([place]) Cardiomyopathy likely related to uncontrolled hypertension.  At last visit, we discussed proceeding with stress testing.  PET CT would be ideal test to assess resting/stress EF and r/o CAD   Stage 3a chronic kidney disease (CKD) ([place]) Repeat BMET today.   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  He is still bothered by his hand weakness.  He sees neurology in August.  I have reminded him to continue his home exercises and discuss his weakness with neurology at his follow-up visit  Signed, Jenkins Rouge, MD  06/20/2022 10:01 AM    [address], [place], [place]  [address] Phone: [phone]; Fax: [phone]

## 2022-06-21 ENCOUNTER — Encounter: Payer: Self-pay | Admitting: Physical Therapy

## 2022-06-21 ENCOUNTER — Ambulatory Visit: Payer: Medicare HMO | Admitting: Physical Therapy

## 2022-06-21 ENCOUNTER — Encounter: Payer: Self-pay | Admitting: Orthopaedic Surgery

## 2022-06-21 DIAGNOSIS — M6281 Muscle weakness (generalized): Secondary | ICD-10-CM

## 2022-06-21 DIAGNOSIS — I63512 Cerebral infarction due to unspecified occlusion or stenosis of left middle cerebral artery: Secondary | ICD-10-CM

## 2022-06-21 DIAGNOSIS — M25511 Pain in right shoulder: Secondary | ICD-10-CM | POA: Diagnosis not present

## 2022-06-21 DIAGNOSIS — G8929 Other chronic pain: Secondary | ICD-10-CM

## 2022-06-21 NOTE — Therapy (Signed)
OUTPATIENT PHYSICAL THERAPY TREATMENT NOTE   Patient Name: Joseph Patrick MRN: [id number] DOB:October 01, 1945, 76 y.o., male Today's Date: 06/21/2022  PCP: Arthur Holms, NP   REFERRING PROVIDER: Aundra Dubin, PA-C    END OF SESSION:   PT End of Session - 06/21/22 1239     Visit Number 2    Number of Visits 13    Date for PT Re-Evaluation 07/31/22    Authorization Type [employer] MCR    Progress Note Due on Visit 10    PT Start Time 1237    PT Stop Time [id number]    PT Time Calculation (min) 38 min             Past Medical History:  Diagnosis Date   CVA (cerebral vascular accident) ([place])    april 2023   Diabetes mellitus without complication ([place])    Hypertension    History reviewed. No pertinent surgical history. 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 ([place]) 01/14/2022   HFrEF (heart failure with reduced ejection fraction) ([place])    Essential hypertension 01/07/2022   Hyperlipidemia 01/07/2022   Diabetes ([place]) 01/07/2022   Stage 3a chronic kidney disease (CKD) ([place]) 01/07/2022   Transient cerebral ischemia 01/06/2022    REFERRING DIAG: Chronic right shoulder pain   THERAPY DIAG:  Chronic right shoulder pain  Muscle weakness (generalized)  Acute ischemic left MCA stroke [facility])  Rationale for Evaluation and Treatment Rehabilitation  PERTINENT HISTORY: CVA April 2023 resulting in right sided weakness   PRECAUTIONS: Fall   SUBJECTIVE: I do not have any pain unless I move a certain way. I did some of the exercises.   PAIN:  Are you having pain? 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 No fatty replacement is present but there is edema in the supraspinatus and infraspinatus muscle bellies compatible early atrophy. Severe tendinopathy of the intra-articular long head of biceps and infraspinatus. Bulky acromioclavicular osteoarthritis and subacromial spurring. Moderate glenohumeral osteoarthritis. Debris containing fluid in the subacromial/subdeltoid bursa.   PATIENT SURVEYS:  Not assessed   COGNITION:           Overall cognitive status: Within functional limits for tasks assessed                                  SENSATION: WFL   POSTURE: Slight rounded shoulders   UPPER EXTREMITY ROM:    Active ROM Right eval Left eval  Shoulder flexion 90 -  Shoulder extension 30 -  Shoulder abduction 80 -  Shoulder internal rotation Unable to reach behind back -  Shoulder external rotation Unable to reach behind head -               Patient also demonstrates right elbow extension limitation   UPPER EXTREMITY MMT: Strength not formally assessed for right shoulder, he exhibits significant strength deficit of the right shoulder with inability to hold against resistance with shoulder external rotation or elevation due to weakness   JOINT MOBILITY TESTING:  Hypomoblity of the right GHJ   PALPATION:  Non tender to palpation right shoulder               TODAY'S TREATMENT:  [facility] Adult PT Treatment:                                                DATE: 06/21/22 Therapeutic Exercise: Supine dowel shoulder flexion x 10 Clasped hand pullovers supine Supine dowel serratus punch x 10 Manual isometric 5 sec x 10 ER and IR  Seated cane shoulder ER x 10 Seated table slide shoulder flexion 2 x 10 Seated yellow band row Seated clasped hand pullovers   Manual Therapy: Passive ROM all planes right shoulder     EVAL/TREATMENT   Supine dowel shoulder flexion x 10 Supine dowel serratus punch x 10 Seated cane shoulder ER x 10 Seated table slide shoulder flexion x 10   PATIENT EDUCATION: Education details: Exam findings, POC, HEP, OT referral for right wrist and hand weakness and swelling, once finished with PT for shoulder to reach out to Neuro doctor for referral to neuro PT for right LE Person educated: Patient Education method: Explanation, Demonstration, Tactile cues, Verbal cues, and Handouts Education comprehension: verbalized understanding, returned demonstration, verbal cues required, tactile cues required, and needs further education   HOME EXERCISE PROGRAM: Access Code: [email or web address]     ASSESSMENT: CLINICAL IMPRESSION: Patient is a 76 y.o. male who was seen today for physical therapy treatment for right shoulder pain, weakness, and limitation in mobility. He does have an MRI confirmed rotator cuff tear. Reviewed HEP and progressed with AAROM, isometrics and PROM. He tolerated session well without increased pain.       OBJECTIVE IMPAIRMENTS decreased activity tolerance, decreased ROM, decreased strength, postural dysfunction, and pain.    ACTIVITY LIMITATIONS carrying, lifting, dressing, reach over head, and hygiene/grooming   PARTICIPATION LIMITATIONS: meal prep, cleaning, shopping, community activity, and yard work   [place], Past/current experiences, Time since onset of injury/illness/exacerbation, and 3+ comorbidities: see above  are also affecting patient's functional outcome.    REHAB POTENTIAL: Good   CLINICAL DECISION MAKING: Evolving/moderate complexity   EVALUATION COMPLEXITY: Moderate     GOALS: Goals reviewed with patient? Yes   SHORT TERM GOALS: Target date: 07/10/2022    Patient will be I with initial HEP in order to progress with therapy. Baseline: HEP provided at eval Goal status: INITIAL   2.  Patient will report 50% improvement in pain at night in order to  improve sleeping ability Baseline: patient reports pain will wake him on a nightly basis Goal status: INITIAL   LONG TERM GOALS: Target date: 07/31/2022   Patient will be I with final HEP to maintain progress from PT. Baseline: HEP provided at eval Goal status: INITIAL   2.  Patient will demonstrate right shoulder active elevation >/= 120 deg in order to improve functional use of right arm with overhead tasks and self care  Baseline: 90 deg shoulder flexion Goal status: INITIAL   3.  Patient will report </= 1/10 pain level with all activities using the right arm to reduce functional limitations Baseline: 2/10 pain Goal status: INITIAL   4.  Patient will report no more than minimal limitation with performing household activities, dressing, and all self care tasks in order to indicate improved functional ability Baseline: patient reports  inability to perform some household and seld care tasks Goal status: INITIAL     PLAN: PT FREQUENCY: 1-2x/week   PT DURATION: 6 weeks   PLANNED INTERVENTIONS: Therapeutic exercises, Therapeutic activity, Neuromuscular re-education, Balance training, Gait training, Patient/Family education, Self Care, Joint mobilization, Joint manipulation, Aquatic Therapy, Dry Needling, Electrical stimulation, Cryotherapy, Moist heat, Taping, Manual therapy, and Re-evaluation   PLAN FOR NEXT SESSION: Review HEP and progress PRN, right shoulder mobs/PROM, progress AAROM to AROM as tolerated, progress shoulder and parascapular strengthening as tolerated        Hessie Diener, PTA 06/21/22 1:25 PM Phone: [phone] Fax: [phone]

## 2022-06-21 NOTE — Telephone Encounter (Signed)
That's fine.  Please send referral to OT at our office for hand and wrist rehab.  Thanks.

## 2022-06-22 ENCOUNTER — Other Ambulatory Visit: Payer: Self-pay

## 2022-06-22 ENCOUNTER — Encounter: Payer: Self-pay | Admitting: Physical Medicine and Rehabilitation

## 2022-06-22 DIAGNOSIS — M25531 Pain in right wrist: Secondary | ICD-10-CM

## 2022-06-26 ENCOUNTER — Ambulatory Visit: Payer: Medicare HMO | Admitting: Physical Therapy

## 2022-06-26 ENCOUNTER — Ambulatory Visit: Payer: Medicare HMO | Admitting: Cardiovascular Disease

## 2022-06-26 ENCOUNTER — Encounter: Payer: Self-pay | Admitting: Physical Therapy

## 2022-06-26 DIAGNOSIS — M6281 Muscle weakness (generalized): Secondary | ICD-10-CM

## 2022-06-26 DIAGNOSIS — I63512 Cerebral infarction due to unspecified occlusion or stenosis of left middle cerebral artery: Secondary | ICD-10-CM

## 2022-06-26 DIAGNOSIS — G8929 Other chronic pain: Secondary | ICD-10-CM

## 2022-06-26 DIAGNOSIS — M25511 Pain in right shoulder: Secondary | ICD-10-CM | POA: Diagnosis not present

## 2022-06-26 NOTE — Therapy (Signed)
OUTPATIENT PHYSICAL THERAPY TREATMENT NOTE   Patient Name: Joseph Patrick MRN: [id number] DOB:February 15, 1946, 76 y.o., male Today's Date: 06/26/2022  PCP: Arthur Holms, NP   REFERRING PROVIDER: Aundra Dubin, PA-C    END OF SESSION:   PT End of Session - 06/26/22 1237     Visit Number 3    Number of Visits 13    Authorization Type [employer] MCR    Authorization Time Period 06/19/22-07/20/22    Authorization - Visit Number 3    Authorization - Number of Visits 10    Progress Note Due on Visit 10    PT Start Time [id number]    PT Stop Time [id number]    PT Time Calculation (min) 40 min             Past Medical History:  Diagnosis Date   CVA (cerebral vascular accident) ([place])    april 2023   Diabetes mellitus without complication ([place])    Hypertension    History reviewed. No pertinent surgical history. 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  Rationale for Evaluation and Treatment Rehabilitation  PERTINENT HISTORY: CVA April 2023 resulting in right sided weakness   PRECAUTIONS: Fall   SUBJECTIVE: I've been doing the exercises.   PAIN:  Are you having pain? 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 No fatty replacement is present but there is edema in the supraspinatus and infraspinatus muscle bellies compatible early atrophy. Severe tendinopathy of the intra-articular long head of biceps and infraspinatus. Bulky acromioclavicular osteoarthritis and subacromial spurring. Moderate glenohumeral osteoarthritis. Debris containing fluid in the subacromial/subdeltoid bursa.   PATIENT SURVEYS:  Not assessed   COGNITION:           Overall cognitive status: Within functional limits for tasks assessed                                  SENSATION: WFL   POSTURE: Slight rounded shoulders   UPPER EXTREMITY ROM:    Active ROM Right eval Left eval Right  06/26/22  Shoulder flexion 90 - 100 supine  Shoulder extension 30 -   Shoulder abduction 80 -   Shoulder internal rotation Unable to reach behind back -   Shoulder external rotation Unable to reach behind head -                Patient also demonstrates right elbow extension limitation   UPPER EXTREMITY MMT: Strength not formally assessed for right shoulder, he exhibits significant strength deficit of the right shoulder with inability to hold against resistance with shoulder external rotation or elevation due to weakness   JOINT MOBILITY TESTING:  Hypomoblity of the right GHJ   PALPATION:  Non tender to palpation right shoulder               TODAY'S TREATMENT:  [facility] Adult PT Treatment:                                                DATE: 06/26/22 Therapeutic Exercise: Supine assisted into shoulder flexion 90- hold then small circles unassisted x 60 sec  Supine neutral with elbow flexed- rhythmic stabilization and isometric ER and IR - PTA manual resist  Supine dowel shoulder flexion x 10, dowel pullovers  Clasped hand pullovers supine  10 x 2  Supine serratus punch x 10 - arm supported by  PTA Isometric shoulder extension supine Supine bicep curls- manual resist  Supine tricep press- manual resist Seated scap retract  Seated cane shoulder ER x 10   [facility] Adult PT Treatment:                                                DATE: 06/21/22 Therapeutic Exercise: Supine dowel shoulder flexion x 10 Clasped hand pullovers supine Supine dowel serratus punch x 10 Manual isometric 5 sec x 10 ER and IR  Seated cane shoulder ER x 10 Seated table slide shoulder flexion 2 x 10 Seated yellow band row Seated clasped hand pullovers   Manual Therapy: Passive ROM all planes right shoulder     EVAL/TREATMENT  Supine dowel shoulder flexion x 10 Supine dowel serratus punch x 10 Seated cane shoulder ER x 10 Seated table slide shoulder flexion x 10   PATIENT EDUCATION: Education details: Exam findings, POC, HEP, OT referral for right wrist and hand weakness and swelling, once finished with PT for shoulder to reach out to Neuro doctor for referral to neuro PT for right LE Person educated: Patient Education method: Explanation, Demonstration, Tactile cues, Verbal cues, and Handouts Education comprehension: verbalized understanding, returned demonstration, verbal cues required, tactile cues required, and needs further education   HOME EXERCISE PROGRAM: Access Code: [email or web address]     ASSESSMENT: CLINICAL IMPRESSION: Patient is a 76 y.o. male who was seen today for physical therapy treatment for right shoulder pain, weakness, and limitation in mobility. He does have an MRI confirmed rotator cuff tear. Continue with stabilization, AAROM and isometrics  with most exercises performed with manual assist/ resistance from therapist. He tolerated session well without increased pain.  He demonstrates improved tolerance to therex today and demonstrates improved supine overhead ROM.      OBJECTIVE IMPAIRMENTS decreased activity tolerance, decreased ROM, decreased strength, postural dysfunction, and pain.     ACTIVITY LIMITATIONS carrying, lifting, dressing, reach over head, and hygiene/grooming   PARTICIPATION LIMITATIONS: meal prep, cleaning, shopping, community activity, and yard work   [employer], Past/current experiences, Time since onset of injury/illness/exacerbation, and 3+ comorbidities: see above  are also affecting patient's functional outcome.    REHAB POTENTIAL: Good   CLINICAL DECISION MAKING: Evolving/moderate complexity   EVALUATION COMPLEXITY: Moderate     GOALS: Goals reviewed with patient? Yes   SHORT TERM GOALS: Target date: 07/10/2022    Patient will be I with initial HEP in order to progress with therapy. Baseline: HEP provided at eval Goal  status: INITIAL   2.  Patient will report 50% improvement in pain at night in order to improve sleeping ability Baseline: patient reports pain will wake him on a nightly basis Goal status: INITIAL   LONG TERM GOALS: Target date: 07/31/2022   Patient will be I with final HEP to maintain progress from PT. Baseline: HEP provided at eval Goal status: INITIAL   2.  Patient will demonstrate right shoulder active elevation >/= 120 deg in order to improve functional use of right arm with overhead tasks and self care  Baseline: 90 deg shoulder flexion Goal status: INITIAL   3.  Patient will report </= 1/10 pain level with all activities using the right arm to reduce functional limitations Baseline: 2/10 pain Goal status: INITIAL   4.  Patient will report no more than minimal limitation with performing household activities, dressing, and all self care tasks in order to indicate improved functional ability Baseline: patient reports inability to perform some household and seld care tasks Goal status: INITIAL     PLAN: PT FREQUENCY: 1-2x/week   PT DURATION: 6 weeks   PLANNED INTERVENTIONS: Therapeutic exercises, Therapeutic activity, Neuromuscular re-education, Balance training, Gait training, Patient/Family  education, Self Care, Joint mobilization, Joint manipulation, Aquatic Therapy, Dry Needling, Electrical stimulation, Cryotherapy, Moist heat, Taping, Manual therapy, and Re-evaluation   PLAN FOR NEXT SESSION: Review HEP and progress PRN, right shoulder mobs/PROM, progress AAROM to AROM as tolerated, progress shoulder and parascapular strengthening as tolerated        Hessie Diener, PTA 06/26/22 2:12 PM Phone: [phone] Fax: [phone]

## 2022-06-28 ENCOUNTER — Ambulatory Visit: Payer: Medicare HMO | Admitting: Physical Therapy

## 2022-06-28 ENCOUNTER — Encounter: Payer: Self-pay | Admitting: Rehabilitative and Restorative Service Providers"

## 2022-06-28 ENCOUNTER — Ambulatory Visit (INDEPENDENT_AMBULATORY_CARE_PROVIDER_SITE_OTHER): Payer: Medicare HMO | Admitting: Rehabilitative and Restorative Service Providers"

## 2022-06-28 ENCOUNTER — Other Ambulatory Visit: Payer: Self-pay

## 2022-06-28 ENCOUNTER — Encounter: Payer: Self-pay | Admitting: Physical Therapy

## 2022-06-28 DIAGNOSIS — R600 Localized edema: Secondary | ICD-10-CM

## 2022-06-28 DIAGNOSIS — M6281 Muscle weakness (generalized): Secondary | ICD-10-CM

## 2022-06-28 DIAGNOSIS — R278 Other lack of coordination: Secondary | ICD-10-CM | POA: Diagnosis not present

## 2022-06-28 DIAGNOSIS — M25631 Stiffness of right wrist, not elsewhere classified: Secondary | ICD-10-CM

## 2022-06-28 DIAGNOSIS — I63512 Cerebral infarction due to unspecified occlusion or stenosis of left middle cerebral artery: Secondary | ICD-10-CM

## 2022-06-28 DIAGNOSIS — M25511 Pain in right shoulder: Secondary | ICD-10-CM | POA: Diagnosis not present

## 2022-06-28 DIAGNOSIS — G8929 Other chronic pain: Secondary | ICD-10-CM

## 2022-06-28 NOTE — Therapy (Signed)
OUTPATIENT OCCUPATIONAL THERAPY ORTHO EVALUATION  Patient Name: Joseph Patrick MRN: [id number] DOB:Feb 02, 1946, 76 y.o., male Today's Date: 06/28/2022  PCP: Arthur Holms, NP REFERRING PROVIDER: Leandrew Koyanagi, MD   OT End of Session - 06/28/22 [phone]     Visit Number 1    Number of Visits 12    Date for OT Re-Evaluation 08/10/22    Authorization Type [employer]    Progress Note Due on Visit 10    OT Start Time 1000    OT Stop Time 1107    OT Time Calculation (min) 67 min    Activity Tolerance No increased pain;Patient tolerated treatment well;Patient limited by fatigue    Behavior During Therapy [facility] for tasks assessed/performed             Past Medical History:  Diagnosis Date   CVA (cerebral vascular accident) [facility])    april 2023   Diabetes mellitus without complication ([place])    Hypertension    History reviewed. No pertinent surgical history. 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 ([place]) 01/14/2022   HFrEF (heart failure with reduced ejection fraction) ([place])    Essential hypertension 01/07/2022   Hyperlipidemia 01/07/2022   Diabetes ([place]) 01/07/2022   Stage 3a chronic kidney disease (CKD) ([place]) 01/07/2022   Transient cerebral ischemia 01/06/2022    ONSET DATE:  July 16th, 2023 (about 12 weeks since injury)   REFERRING DIAG: M25.531 (ICD-10-CM) - Pain in right wrist  THERAPY DIAG:  Stiffness of right wrist, not elsewhere classified  Muscle weakness (generalized)  Localized edema  Other lack of coordination  Rationale for Evaluation and Treatment Rehabilitation  SUBJECTIVE:   SUBJECTIVE STATEMENT: 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 He states falling ~2 months ago and wrist bend into flexion and he tore ligaments in his wrist. He states his right hand is now swollen and stiff. He  also states having a stroke this April (about 6 months ago) that affected Rt side, but he states rehabbing back to "normal" before fall. He states no significant pain at fall and none now- just swollen, tight, stiff and also poorer coordination now. He states not wearing any wrist brace after fall, tried a sling for his shoulder, which he felt "made him worse," so d/c'd it.   His niece is with him today to support him.    PERTINENT HISTORY: Per medical notes, he fell ~ 2 months ago, tearing (complete supraspinatus) Rt RTC, tore Rt TFCC as well as S-L ligament.   PRECAUTIONS: Fall and Other: Rt RTC tears   WEIGHT BEARING RESTRICTIONS Yes caution for Rt Shoulder and wrist  PAIN:  Are you having pain? 0 Rating: 0/10 at rest, up to 0/10 in past week at worst and states not actively avoiding use thereof  FALLS: Has patient fallen in last 6 months? Yes. Number of falls 1 - this injury  LIVING ENVIRONMENT: Lives with: will be discussed next session more, it seems like he may live alone. He states driving since stroke. He uses a cane for stability.    PLOF: Independent with basic ADLs, Independent with household mobility with device, Independent with community mobility with device, Independent with gait, Independent with transfers, and Requires assistive device for independence  PATIENT GOALS To get better use of Rt wrist/hand.    OBJECTIVE: (All objective assessments below  are from initial evaluation on: 06/28/22 unless otherwise specified.)    HAND DOMINANCE: Right   ADLs: Overall ADLs: He states problems with dressing, bathing, turning door handles, and FMS buttoning with Rt hand now.   FUNCTIONAL OUTCOME MEASURES: Eval: Patient Rated Wrist Evaluation (PRWE) (modified to exclude work and recreational activities as he states not doing at baseline): Pain: 0/50; Function: 19.5/40; Total Score: 19.5/90 (Higher Score  =  More Pain and/or Debility)    UPPER EXTREMITY ROM     Shoulder to  Wrist AROM Right eval Left eval  Forearm supination 63   Forearm pronation  80   Wrist flexion 37* (tender) (40* PROM)  50*  Wrist extension 28* (40* PROM)  68*  Wrist ulnar deviation 7*   Wrist radial deviation 5*   Functional dart thrower's motion (F-DTM) in ulnar flexion 25*   F-DTM in radial extension  27*   (Blank rows = not tested)   Hand AROM Right eval  Full Fist Ability (or Gap to Distal Palmar Crease) Loose fist (fingers barely touch palm  Thumb Opposition to Small Finger (or Gap) 1.5cm gap and slow motion  Thumb Opposition to Base of Small Finger (or Gap)  unable  (Blank rows = not tested)   UPPER EXTREMITY MMT:    Eval:  NT at eval due to concern for ligament tears, however he was stable and non-painful and this will be tested next session to help "tease out" CVA type symptoms as well. Definite weakness Rt vs Lt.   MMT Right TBD  Elbow flexion   Elbow extension   Forearm supination   Forearm pronation   Wrist flexion   Wrist extension   Wrist ulnar deviation   Wrist radial deviation   (Blank rows = not tested)  HAND FUNCTION: Eval: Observed weakness in affected hand. Details TBD Grip strength Right: TBD lbs, Left: TBD lbs   COORDINATION: Eval: He has observed ataxic motion with finger to nose test, observed slow and poor FMS in Rt hand compared to Lt.  9HPT: Rt 25min 46sec with several pegs dropped.   SENSATION: Eval:  Light touch intact today, he states equal b/l   EDEMA:   Eval:  Mildly swollen in Rt hand and wrist today  COGNITION: Eval: Overall cognitive status: WFL for evaluation today, though states some memory issues  OBSERVATIONS:   Eval: no facial droop, but Rt sided ataxia present in UE and LE, poor tone and coordination noted as well.  Concern for exacerbation of CVA as main issue vs ligament tears and orthopedic issues.  (Rt wrist seems stable, negative Watson's Test, no DRUJ instability, negative TFCC shear test)   TODAY'S TREATMENT:   Post-evaluation treatment:  He was edu for stroke safety- to consult with doctor/nutritionist for healthy lifestyle changes to prevent another stroke. He was cautioned that driving could be dangerous as his speed and reaction timing is poor. He was also edu on starter HEP activities for FMS & coordination with in-hand manipulations (translation, shift, rotation, flip), his unable to rotate large marker without dropping.  Also edu to perform composite fist/open hand 10x slowly with tension, 15x fast as possible for coordination- all with hand above heart to help with swelling.  OT also provides him compressive wrist sleeve and glove to help decrease swelling (self-care). Told to take off if uncomfortable. He demo's back HEP and states understanding. Niece also present and states understanding.    PATIENT EDUCATION: Education details: See tx section above for details  Person educated: Patient Education method: Veterinary surgeon, Teach back, Handouts  Education comprehension: States and demonstrates understanding, Additional Education required    HOME EXERCISE PROGRAM: See tx section above for details    GOALS: Goals reviewed with patient? Yes   SHORT TERM GOALS: (STG required if POC>30 days) Target Date: 07/13/22  Pt will obtain protective, custom orthotic. Goal status: TBD/PRN for wrist pain/instability  2.  Pt will demo/state understanding of initial HEP to improve pain levels and prerequisite motion. Goal status: INITIAL   LONG TERM GOALS: Target Date: 08/10/22  Pt will improve functional ability by decreased impairment per PRWE functional assessment from 19.5/40 to 10/40 or better, for better quality of life. Goal status: INITIAL  2.  Pt will improve grip strength in Rt hand from TBDlbs to at least 40lbs for functional use at home and in IADLs. Goal status: INITIAL  3.  Pt will improve A/ROM in Rt wrist flex & ext from 37/28* respectively, to at least 40* both, to have  functional motion for tasks like reach and grasp.  Goal status: INITIAL  4.  Pt will improve strength in Rt wrist flex, ext to at least 4+/5 MMT to have increased functional ability to carry out selfcare and higher-level homecare tasks with no difficulty. Goal status: INITIAL  5.  Pt will improve coordination skills in Rt hand, as seen by better score on 9HPT testing to at least 55sec to have increased functional ability to carry out fine motor tasks (fasteners, etc.) and more complex, coordinated IADLs (meal prep, sports, etc.).  Goal status: INITIAL    ASSESSMENT:  CLINICAL IMPRESSION: Patient is a 76 y.o. male who was seen today for occupational therapy evaluation for Rt wrist pain. There is also a concern for evolved stroke, and he was cautioned about this and told to visit a doctor. OT also contacted his referring MD about this concern as the PCP on file states they have "no record" of him being a pt there. He will benefit from course of OP OT to improve quality of life.   PERFORMANCE DEFICITS in functional skills including ADLs, IADLs, coordination, dexterity, edema, tone, ROM, strength, fascial restrictions, flexibility, FMC, GMC, body mechanics, endurance, decreased knowledge of precautions, and UE functional use, cognitive skills including problem solving and safety awareness, and psychosocial skills including coping strategies, environmental adaptation, and habits.   IMPAIRMENTS are limiting patient from ADLs, IADLs, leisure, and social participation.   COMORBIDITIES has co-morbidities such as past CVA, CKD III, cardiomyopathy, DM II, and others   that affects occupational performance. Patient will benefit from skilled OT to address above impairments and improve overall function.  MODIFICATION OR ASSISTANCE TO COMPLETE EVALUATION: Min-Moderate modification of tasks or assist with assess necessary to complete an evaluation.  OT OCCUPATIONAL PROFILE AND HISTORY: Detailed assessment:  Review of records and additional review of physical, cognitive, psychosocial history related to current functional performance.  CLINICAL DECISION MAKING: Moderate - several treatment options, min-mod task modification necessary  REHAB POTENTIAL: Good  EVALUATION COMPLEXITY: Moderate      PLAN: OT FREQUENCY: 2x/week  OT DURATION: 6 weeks  PLANNED INTERVENTIONS: self care/ADL training, therapeutic exercise, therapeutic activity, neuromuscular re-education, manual therapy, passive range of motion, functional mobility training, splinting, electrical stimulation, ultrasound, fluidotherapy, compression bandaging, moist heat, cryotherapy, contrast bath, patient/family education, and coping strategies training  RECOMMENDED OTHER SERVICES: is getting PT for shoulder tears right now. PT was recommended to assess and tx for LE weakness/stroke signs as well. He was also  recommended to see cardiologist/neurologist about possible stroke evolution.   CONSULTED AND AGREED WITH PLAN OF CARE: Patient and family member/caregiver  PLAN FOR NEXT SESSION: Check with initial HEP recommendations, discuss home situation and safety, double check his PCP info, add wrist and FA stretches as tolerated, check MMT   Sheniqua Carolan, OTR/L, CHT 06/28/2022, 12:03 PM    Referring diagnosis? M25.531 (ICD-10-CM) - Pain in right wrist Treatment diagnosis? (if different than referring diagnosis) M25.631, R27.8, M62.81  What was this (referring dx) caused by? []  Surgery [x]  Fall []  Ongoing issue []  Arthritis []  Other: ____________  Laterality: [x]  Rt []  Lt []  Both  Check all possible CPT codes:  *CHOOSE 10 OR LESS*    [x]  97110 (Therapeutic Exercise)  []  92507 (SLP Treatment)  [x]  97112 (Neuro Re-ed)   []  92526 (Swallowing Treatment)   []  97116 (Gait Training)   []  [id number] (Cognitive Training, 1st 15 minutes) [x]  97140 (Manual Therapy)   []  97130 (Cognitive Training, each add'l 15 minutes)  []  97164  (Re-evaluation)                              []  Other, List CPT Code ____________  [x]  [id number] (Therapeutic Activities)     [x]  97535 (Self Care)   []  All codes above (97110 - 97535)  []  97012 (Mechanical Traction)  []  97014 (E-stim Unattended)  []  97032 (E-stim manual)  []  97033 (Ionto)  [x]  97035 (Ultrasound) []  97750 (Physical Performance Training) []  [id number] (Aquatic Therapy) []  97016 (Vasopneumatic Device) []  [id number] (Paraffin) []  97034 (Contrast Bath) []  97597 (Wound Care 1st 20 sq cm) []  97598 (Wound Care each add'l 20 sq cm) [x]  97760 (Orthotic Fabrication, Fitting, Training Initial) []  [id number] (Prosthetic Management and Training Initial) [x]  [id number] (Orthotic or Prosthetic Training/ Modification Subsequent)

## 2022-06-28 NOTE — Therapy (Signed)
OUTPATIENT PHYSICAL THERAPY TREATMENT NOTE   Patient Name: Joseph Patrick MRN: [id number] DOB:1946/06/23, 76 y.o., male Today's Date: 06/28/2022  PCP: Arthur Holms, NP   REFERRING PROVIDER: Aundra Dubin, PA-C    END OF SESSION:   PT End of Session - 06/28/22 0849     Visit Number 4    Number of Visits 13    Date for PT Re-Evaluation 07/31/22    Authorization Type [employer] MCR    Authorization Time Period 06/19/22-07/20/22    Authorization - Visit Number 4    Authorization - Number of Visits 10    Progress Note Due on Visit 10    PT Start Time 0848    PT Stop Time 0928    PT Time Calculation (min) 40 min             Past Medical History:  Diagnosis Date   CVA (cerebral vascular accident) [facility])    april 2023   Diabetes mellitus without complication ([place])    Hypertension    History reviewed. No pertinent surgical history. 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 ([place]) 01/14/2022   HFrEF (heart failure with reduced ejection fraction) ([place])    Essential hypertension 01/07/2022   Hyperlipidemia 01/07/2022   Diabetes ([place]) 01/07/2022   Stage 3a chronic kidney disease (CKD) ([place]) 01/07/2022   Transient cerebral ischemia 01/06/2022    REFERRING DIAG: Chronic right shoulder pain   THERAPY DIAG:  Chronic right shoulder pain  Muscle weakness (generalized)  Acute ischemic left MCA stroke [facility])  Rationale for Evaluation and Treatment Rehabilitation  PERTINENT HISTORY: CVA April 2023 resulting in right sided weakness   PRECAUTIONS: Fall   SUBJECTIVE: I've been doing the exercises.   PAIN:  Are you having pain? 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 No fatty replacement is present but there is edema in the supraspinatus and infraspinatus muscle bellies compatible early atrophy. Severe tendinopathy of the intra-articular long head of biceps and infraspinatus. Bulky acromioclavicular osteoarthritis and subacromial spurring. Moderate glenohumeral osteoarthritis. Debris containing fluid in the subacromial/subdeltoid bursa.   PATIENT SURVEYS:  Not assessed   COGNITION:           Overall cognitive status: Within functional limits for tasks assessed                                  SENSATION: WFL   POSTURE: Slight rounded shoulders   UPPER EXTREMITY ROM:    Active ROM Right eval Left eval Right  06/26/22  Shoulder flexion 90 - 100 supine  Shoulder extension 30 -   Shoulder abduction 80 -   Shoulder internal rotation Unable to reach behind back -   Shoulder external rotation Unable to reach behind head -                Patient also demonstrates right elbow extension limitation   UPPER EXTREMITY MMT: Strength not formally assessed for right shoulder, he exhibits significant strength deficit of the right shoulder with  inability to hold against resistance with shoulder external rotation or elevation due to weakness   JOINT MOBILITY TESTING:  Hypomoblity of the right GHJ   PALPATION:  Non tender to palpation right shoulder               TODAY'S TREATMENT:  [facility] Adult PT Treatment:                                                DATE: 06/28/22 Therapeutic Exercise: Supine assisted into shoulder flexion 90- hold then small circles unassisted x 60 sec  Supine AROM RT chest press 10 x 2  Supine neutral with elbow flexed- rhythmic stabilization and isometric ER and IR - PTA manual resist  Supine dowel shoulder  flexion x 10, dowel pullovers  Clasped hand pullovers supine  10 x 2  Supine serratus punch x 10 - arm supported by PTA Isometric shoulder extension supine Supine bicep curls- manual resist  Supine tricep press- manual resist Supine Cane AAROM ER/IR Seated scap retract  Seated cane shoulder ER x 10 Seated shoulder flexion slides Seated shoulder ER/IR slides on table , circles Seated yellow band ER , IR to fatigue  [facility] Adult PT Treatment:                                                DATE: 06/26/22 Therapeutic Exercise: Supine assisted into shoulder flexion 90- hold then small circles unassisted x 60 sec  Supine neutral with elbow flexed- rhythmic stabilization and isometric ER and IR - PTA manual resist  Supine dowel shoulder flexion x 10, dowel pullovers  Clasped hand pullovers supine  10 x 2  Supine serratus punch x 10 - arm supported by PTA Isometric shoulder extension supine Supine bicep curls- manual resist  Supine tricep press- manual resist Seated scap retract  Seated cane shoulder ER x 10   [facility] Adult PT Treatment:                                                DATE: 06/21/22 Therapeutic Exercise: Supine dowel shoulder flexion x 10 Clasped hand pullovers supine Supine dowel serratus punch x 10 Manual isometric 5 sec x 10 ER and IR  Seated cane shoulder ER x 10 Seated table slide shoulder flexion 2 x 10 Seated yellow band row Seated clasped hand pullovers   Manual Therapy: Passive ROM all planes right shoulder     EVAL/TREATMENT  Supine dowel shoulder flexion x 10 Supine dowel serratus punch x 10 Seated cane shoulder ER x 10 Seated table slide shoulder flexion x 10   PATIENT EDUCATION: Education details: Exam findings, POC, HEP, OT referral for right wrist and hand weakness and swelling, once finished with PT for shoulder to reach out to Neuro doctor for referral to neuro PT for right LE Person educated: Patient Education method: Explanation, Demonstration,  Tactile cues, Verbal cues, and Handouts Education comprehension: verbalized understanding, returned demonstration, verbal cues required, tactile cues required, and needs further education   HOME EXERCISE PROGRAM: Access Code: [email or web address]     ASSESSMENT: CLINICAL IMPRESSION: Patient is  a 76 y.o. male who was seen today for physical therapy treatment for right shoulder pain, weakness, and limitation in mobility. He does have an MRI confirmed rotator cuff tear. Continued with stabilization, AAROM and isometrics  with most exercises performed with manual assist/ resistance from therapist. He tolerated session well with min increased pain.  He has OT evaluation today for his right wrist and hand.       OBJECTIVE IMPAIRMENTS decreased activity tolerance, decreased ROM, decreased strength, postural dysfunction, and pain.    ACTIVITY LIMITATIONS carrying, lifting, dressing, reach over head, and hygiene/grooming   PARTICIPATION LIMITATIONS: meal prep, cleaning, shopping, community activity, and yard work   [place], Past/current experiences, Time since onset of injury/illness/exacerbation, and 3+ comorbidities: see above  are also affecting patient's functional outcome.    REHAB POTENTIAL: Good   CLINICAL DECISION MAKING: Evolving/moderate complexity   EVALUATION COMPLEXITY: Moderate     GOALS: Goals reviewed with patient? Yes   SHORT TERM GOALS: Target date: 07/10/2022    Patient will be I with initial HEP in order to progress with therapy. Baseline: HEP provided at eval Goal status: INITIAL   2.  Patient will report 50% improvement in pain at night in order to improve sleeping ability Baseline: patient reports pain will wake him on a nightly basis Goal status: INITIAL   LONG TERM GOALS: Target date: 07/31/2022   Patient will be I with final HEP to maintain progress from PT. Baseline: HEP provided at eval Goal status: INITIAL   2.  Patient will demonstrate right  shoulder active elevation >/= 120 deg in order to improve functional use of right arm with overhead tasks and self care  Baseline: 90 deg shoulder flexion Goal status: INITIAL   3.  Patient will report </= 1/10 pain level with all activities using the right arm to reduce functional limitations Baseline: 2/10 pain Goal status: INITIAL   4.  Patient will report no more than minimal limitation with performing household activities, dressing, and all self care tasks in order to indicate improved functional ability Baseline: patient reports inability to perform some household and seld care tasks Goal status: INITIAL     PLAN: PT FREQUENCY: 1-2x/week   PT DURATION: 6 weeks   PLANNED INTERVENTIONS: Therapeutic exercises, Therapeutic activity, Neuromuscular re-education, Balance training, Gait training, Patient/Family education, Self Care, Joint mobilization, Joint manipulation, Aquatic Therapy, Dry Needling, Electrical stimulation, Cryotherapy, Moist heat, Taping, Manual therapy, and Re-evaluation   PLAN FOR NEXT SESSION: Review HEP and progress PRN, right shoulder mobs/PROM, progress AAROM to AROM as tolerated, progress shoulder and parascapular strengthening as tolerated        Hessie Diener, PTA 06/28/22 10:48 AM Phone: [phone] Fax: [phone]

## 2022-06-29 ENCOUNTER — Ambulatory Visit: Payer: Medicare HMO | Admitting: Physical Medicine and Rehabilitation

## 2022-07-03 ENCOUNTER — Ambulatory Visit: Payer: Medicare HMO | Admitting: Physical Therapy

## 2022-07-03 ENCOUNTER — Encounter: Payer: Self-pay | Admitting: Physical Therapy

## 2022-07-03 ENCOUNTER — Encounter: Payer: Self-pay | Admitting: Rehabilitative and Restorative Service Providers"

## 2022-07-03 ENCOUNTER — Ambulatory Visit: Payer: Medicare HMO | Admitting: Rehabilitative and Restorative Service Providers"

## 2022-07-03 ENCOUNTER — Encounter: Payer: Self-pay | Admitting: Neurology

## 2022-07-03 DIAGNOSIS — I6381 Other cerebral infarction due to occlusion or stenosis of small artery: Secondary | ICD-10-CM

## 2022-07-03 DIAGNOSIS — M25511 Pain in right shoulder: Secondary | ICD-10-CM

## 2022-07-03 DIAGNOSIS — I63512 Cerebral infarction due to unspecified occlusion or stenosis of left middle cerebral artery: Secondary | ICD-10-CM | POA: Diagnosis not present

## 2022-07-03 DIAGNOSIS — R600 Localized edema: Secondary | ICD-10-CM

## 2022-07-03 DIAGNOSIS — M6281 Muscle weakness (generalized): Secondary | ICD-10-CM

## 2022-07-03 DIAGNOSIS — M25631 Stiffness of right wrist, not elsewhere classified: Secondary | ICD-10-CM

## 2022-07-03 DIAGNOSIS — R278 Other lack of coordination: Secondary | ICD-10-CM

## 2022-07-03 DIAGNOSIS — G8929 Other chronic pain: Secondary | ICD-10-CM

## 2022-07-03 DIAGNOSIS — I69351 Hemiplegia and hemiparesis following cerebral infarction affecting right dominant side: Secondary | ICD-10-CM

## 2022-07-03 NOTE — Therapy (Signed)
OUTPATIENT PHYSICAL THERAPY TREATMENT NOTE   Patient Name: Joseph Patrick MRN: [id number] DOB:Aug 02, 1946, 76 y.o., male Today's Date: 07/03/2022  PCP: Arthur Holms, NP   REFERRING PROVIDER: Aundra Dubin, PA-C    END OF SESSION:   PT End of Session - 07/03/22 0935     Visit Number 5    Number of Visits 13    Date for PT Re-Evaluation 07/31/22    Authorization Type [employer] MCR    Authorization Time Period 06/19/22-07/20/22    Authorization - Visit Number 5    Authorization - Number of Visits 10    Progress Note Due on Visit 10    PT Start Time 0932    PT Stop Time 0932    PT Time Calculation (min) 43 min             Past Medical History:  Diagnosis Date   CVA (cerebral vascular accident) [facility])    april 2023   Diabetes mellitus without complication ([place])    Hypertension    History reviewed. No pertinent surgical history. 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 ([place]) 01/14/2022   HFrEF (heart failure with reduced ejection fraction) ([place])    Essential hypertension 01/07/2022   Hyperlipidemia 01/07/2022   Diabetes ([place]) 01/07/2022   Stage 3a chronic kidney disease (CKD) ([place]) 01/07/2022   Transient cerebral ischemia 01/06/2022    REFERRING DIAG: Chronic right shoulder pain   THERAPY DIAG:  Stiffness of right wrist, not elsewhere classified  Muscle weakness (generalized)  Localized edema  Rationale for Evaluation and Treatment Rehabilitation  PERTINENT HISTORY: CVA April 2023 resulting in right sided weakness   PRECAUTIONS: Fall   SUBJECTIVE: I've been doing the exercises.   PAIN:  Are you having pain? 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 No fatty replacement is present but there is edema in the supraspinatus and infraspinatus muscle bellies compatible early atrophy. Severe tendinopathy of the intra-articular long head of biceps and infraspinatus. Bulky acromioclavicular osteoarthritis and subacromial spurring. Moderate glenohumeral osteoarthritis. Debris containing fluid in the subacromial/subdeltoid bursa.   PATIENT SURVEYS:  Not assessed   COGNITION:           Overall cognitive status: Within functional limits for tasks assessed                                  SENSATION: WFL   POSTURE: Slight rounded shoulders   UPPER EXTREMITY ROM:    Active ROM Right eval Left eval Right  06/26/22 Right 07/03/22  Shoulder flexion 90 - 100 supine 95 AAROM with pulleys PROM 110 end range pain  Shoulder extension 30 -    Shoulder abduction 80 -    Shoulder internal rotation Unable to reach behind back -    Shoulder external rotation Unable to reach behind head -                 Patient also demonstrates right elbow extension limitation   UPPER EXTREMITY MMT: Strength not formally  assessed for right shoulder, he exhibits significant strength deficit of the right shoulder with inability to hold against resistance with shoulder external rotation or elevation due to weakness   JOINT MOBILITY TESTING:  Hypomoblity of the right GHJ   PALPATION:  Non tender to palpation right shoulder               TODAY'S TREATMENT:  [facility] Adult PT Treatment:                                                DATE: 07/03/22 Therapeutic Exercise: UBE L1 forwad x 2 min, back x 2 min Pulleys OH- increased shoulder pain Supine on incline wedge- chest press x 15 with cane  Shouler AAROM IR/ER with cane Supine in wedge AROM  RT chest press x 5 fatigues, clasped hand assist x 15 Supine ER/IR rhythmic stabilization Supine on wedge Elbow flexion AROM - increased pain Supine isometric elbow extension  Supine shoulder ext isonemtric     Manual Therapy: Passive right shoulder ROM flexion, abduction, IR, ER    [facility] Adult PT Treatment:                                                DATE: 06/28/22 Therapeutic Exercise: Supine assisted into shoulder flexion 90- hold then small circles unassisted x 60 sec  Supine AROM RT chest press 10 x 2  Supine neutral with elbow flexed- rhythmic stabilization and isometric ER and IR - PTA manual resist  Supine dowel shoulder flexion x 10, dowel pullovers  Clasped hand pullovers supine  10 x 2  Supine serratus punch x 10 - arm supported by PTA Isometric shoulder extension supine Supine bicep curls- manual resist  Supine tricep press- manual resist Supine Cane AAROM ER/IR Seated scap retract  Seated cane shoulder ER x 10 Seated shoulder flexion slides Seated shoulder ER/IR slides on table , circles Seated yellow band ER , IR to fatigue  [facility] Adult PT Treatment:                                                DATE: 06/26/22 Therapeutic Exercise: Supine assisted into shoulder flexion 90- hold then small circles unassisted x 60 sec  Supine neutral with elbow flexed- rhythmic stabilization and isometric ER and IR - PTA manual resist  Supine dowel shoulder flexion x 10, dowel pullovers  Clasped hand pullovers supine  10 x 2  Supine serratus punch x 10 - arm supported by PTA Isometric shoulder extension supine Supine bicep curls- manual resist  Supine tricep press- manual resist Seated scap retract  Seated cane shoulder ER x 10   [facility] Adult PT Treatment:                                                DATE: 06/21/22 Therapeutic Exercise: Supine dowel shoulder flexion x 10 Clasped hand pullovers supine Supine dowel serratus punch x 10 Manual isometric 5 sec x 10  ER and IR   Seated cane shoulder ER x 10 Seated table slide shoulder flexion 2 x 10 Seated yellow band row Seated clasped hand pullovers   Manual Therapy: Passive ROM all planes right shoulder     EVAL/TREATMENT  Supine dowel shoulder flexion x 10 Supine dowel serratus punch x 10 Seated cane shoulder ER x 10 Seated table slide shoulder flexion x 10   PATIENT EDUCATION: Education details: Exam findings, POC, HEP, OT referral for right wrist and hand weakness and swelling, once finished with PT for shoulder to reach out to Neuro doctor for referral to neuro PT for right LE Person educated: Patient Education method: Explanation, Demonstration, Tactile cues, Verbal cues, and Handouts Education comprehension: verbalized understanding, returned demonstration, verbal cues required, tactile cues required, and needs further education   HOME EXERCISE PROGRAM: Access Code: [email or web address]     ASSESSMENT: CLINICAL IMPRESSION: Patient is a 76 y.o. male who was seen today for physical therapy treatment for right shoulder pain, weakness, and limitation in mobility. He does have an MRI confirmed rotator cuff tear. Continued with stabilization, AAROM, AROM and isometrics with most exercises performed with manual assist/ resistance from therapist. Began UBE today with good tolerance. Began  OH pulleys which increased his shoulder pain. He was able to perform AROM right shoulder flexion with short lever arm in reclined position, however fatigued quickly.  He is independent and compliant with HEP. STG#1 Met.  He had OT eval last week which recommended he be evaluated by PT for LE weakness.      OBJECTIVE IMPAIRMENTS decreased activity tolerance, decreased ROM, decreased strength, postural dysfunction, and pain.    ACTIVITY LIMITATIONS carrying, lifting, dressing, reach over head, and hygiene/grooming   PARTICIPATION LIMITATIONS: meal prep, cleaning, shopping, community activity, and yard work   [place], Past/current experiences, Time since onset of injury/illness/exacerbation, and 3+ comorbidities: see above  are also affecting patient's functional outcome.    REHAB POTENTIAL: Good   CLINICAL DECISION MAKING: Evolving/moderate complexity   EVALUATION COMPLEXITY: Moderate     GOALS: Goals reviewed with patient? Yes   SHORT TERM GOALS: Target date: 07/10/2022    Patient will be I with initial HEP in order to progress with therapy. Baseline: HEP provided at eval Goal status: MET   2.  Patient will report 50% improvement in pain at night in order to improve sleeping ability Baseline: patient reports pain will wake him on a nightly basis Goal status: INITIAL   LONG TERM GOALS: Target date: 07/31/2022   Patient will be I with final HEP to maintain progress from PT. Baseline: HEP provided at eval Goal status: INITIAL   2.  Patient will demonstrate right shoulder active elevation >/= 120 deg in order to improve functional use of right arm with overhead tasks and self care  Baseline: 90 deg shoulder flexion Goal status: INITIAL   3.  Patient will report </= 1/10 pain level with all activities using the right arm to reduce functional limitations Baseline: 2/10 pain Goal status: INITIAL   4.  Patient will report no more than minimal limitation with performing household activities, dressing, and all self care tasks in order to indicate improved functional ability Baseline: patient reports inability to perform some household and seld care tasks Goal status: INITIAL     PLAN: PT FREQUENCY: 1-2x/week   PT DURATION: 6 weeks   PLANNED INTERVENTIONS: Therapeutic exercises, Therapeutic activity, Neuromuscular re-education, Balance training, Gait training, Patient/Family education, Self Care, Joint mobilization, Joint manipulation,  Aquatic Therapy, Dry Needling, Electrical stimulation, Cryotherapy, Moist heat, Taping, Manual therapy, and Re-evaluation   PLAN FOR NEXT SESSION:  Review HEP and progress PRN, right shoulder mobs/PROM, progress AAROM to AROM as tolerated, progress shoulder and parascapular strengthening as tolerated        Hessie Diener, PTA 07/03/22 12:06 PM Phone: [phone] Fax: [phone]

## 2022-07-03 NOTE — Therapy (Signed)
OUTPATIENT OCCUPATIONAL THERAPY TREATMENT NOTE  Patient Name: Joseph Patrick MRN: [id number] DOB:[birth year], 76 y.o., male Today's Date: 07/03/2022  PCP: Arthur Holms, NP REFERRING PROVIDER: Leandrew Koyanagi, MD   OT End of Session - 07/03/22 1044     Visit Number 2    Number of Visits 12    Date for OT Re-Evaluation 08/10/22    Authorization Type [employer]    Progress Note Due on Visit 10    OT Start Time 1053    OT Stop Time 1153    OT Time Calculation (min) 60 min    Activity Tolerance No increased pain;Patient tolerated treatment well;Patient limited by fatigue;Patient limited by pain    Behavior During Therapy [facility] for tasks assessed/performed              Past Medical History:  Diagnosis Date   CVA (cerebral vascular accident) [facility])    april 2023   Diabetes mellitus without complication ([place])    Hypertension    History reviewed. No pertinent surgical history. 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 ([place]) 01/14/2022   HFrEF (heart failure with reduced ejection fraction) ([place])    Essential hypertension 01/07/2022   Hyperlipidemia 01/07/2022   Diabetes ([place]) 01/07/2022   Stage 3a chronic kidney disease (CKD) ([place]) 01/07/2022   Transient cerebral ischemia 01/06/2022    ONSET DATE:  July 16th, 2023 (about 12 weeks since injury)   REFERRING DIAG: M25.531 (ICD-10-CM) - Pain in right wrist  THERAPY DIAG:  Stiffness of right wrist, not elsewhere classified  Muscle weakness (generalized)  Localized edema  Acute ischemic left MCA stroke ([facility])  Chronic right shoulder pain  Other lack of coordination  Rationale for Evaluation and Treatment Rehabilitation  PERTINENT HISTORY: Per medical notes, he fell ~ 2 months ago, tearing (complete supraspinatus) Rt RTC, tore Rt TFCC as well as S-L ligament.  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 He states falling ~2 months ago and wrist bend into flexion and he tore ligaments in his wrist. He states his right hand is now swollen and stiff. He also states having a stroke this April (about 6 months ago) that affected Rt side, but he states rehabbing back to "normal" before fall. He states no significant pain at fall and none now- just swollen, tight, stiff and also poorer coordination now. He states not wearing any wrist brace after fall, tried a sling for his shoulder, which he felt "made him worse," so d/c'd it.   His niece is with him today to support him."  PRECAUTIONS: Fall and Other: Rt RTC tears   WEIGHT BEARING RESTRICTIONS Yes caution for Rt Shoulder and wrist   SUBJECTIVE:   SUBJECTIVE STATEMENT: He arrives with niece. States compression glove "hasn't worked yet" because my hand looks the same.  OT explains this is a long process, encourages to continue to wear at night at least     PAIN:  Are you having pain? 0  Rating: 0/10 at rest, up to 0/10 in past week at worst and states not actively avoiding use thereof  FALLS: Has patient fallen in last 6 months? Yes. Number of falls 1 - this injury  LIVING ENVIRONMENT: Lives with: Alone. He states driving since stroke. He uses a cane for stability.     OBJECTIVE: (All objective assessments  below are from initial evaluation on: 06/28/22 unless otherwise specified.)    HAND DOMINANCE: Right   ADLs: Overall ADLs: He states problems with dressing, bathing, turning door handles, and FMS buttoning with Rt hand now.   FUNCTIONAL OUTCOME MEASURES: Eval: Patient Rated Wrist Evaluation (PRWE) (modified to exclude work and recreational activities as he states not doing at baseline): Pain: 0/50; Function: 19.5/40; Total Score: 19.5/90 (Higher Score  =  More Pain and/or Debility)    UPPER EXTREMITY ROM     Shoulder to Wrist AROM Right eval Left eval  Forearm  supination 63   Forearm pronation  80   Wrist flexion 37* (tender) (40* PROM)  50*  Wrist extension 28* (40* PROM)  68*  Wrist ulnar deviation 7*   Wrist radial deviation 5*   Functional dart thrower's motion (F-DTM) in ulnar flexion 25*   F-DTM in radial extension  27*   (Blank rows = not tested)   Hand AROM Right eval  Full Fist Ability (or Gap to Distal Palmar Crease) Loose fist (fingers barely touch palm  Thumb Opposition to Small Finger (or Gap) 1.5cm gap and slow motion  Thumb Opposition to Base of Small Finger (or Gap)  unable  (Blank rows = not tested)   UPPER EXTREMITY MMT:    Eval:  NT at eval due to concern for ligament tears, however he was stable and non-painful and this will be tested next session to help "tease out" CVA type symptoms as well. Definite weakness Rt vs Lt.   MMT Right 07/03/22  Elbow flexion 5/5 good tone, poor endurance  Elbow extension 4-/5 lower tone  Forearm supination 4+/5 good tone  Forearm pronation 4+/5 good tone  Wrist flexion 4+/5  Wrist/finger extension 3+/5  (Blank rows = not tested)  HAND FUNCTION: 07/03/22: Rt19# grip strength (no pain); Lt: 118#   Eval: Observed weakness in affected hand.   COORDINATION: 07/03/22: Box & Blocks Test Right 21 Blocks Today (46 is WFL)   Eval: He has observed ataxic motion with finger to nose test, observed slow and poor FMS in Rt hand compared to Lt.  9HPT: Rt 65min 46sec with several pegs dropped.   SENSATION: Eval:  Light touch intact today, he states equal b/l   EDEMA:   07/03/22: Rt hand figure of 8:   48.7cm today  (Lt hand is 48.8cm);  Rt IF base 7.5cm (Lt hand is 7.2cm)   Eval:  Mildly swollen in Rt hand and wrist today  COGNITION: Eval: Overall cognitive status: WFL for evaluation today, though states some memory issues  OBSERVATIONS:   Eval: no facial droop, but Rt sided ataxia present in UE and LE, poor tone and coordination noted as well.  Concern for exacerbation of CVA as main  issue vs ligament tears and orthopedic issues.  (Rt wrist seems stable, negative Watson's Test, no DRUJ instability, negative TFCC shear test)   TODAY'S TREATMENT:  07/03/22: OT asks about his PCP on file as they state he is not a pt of theirs. He states there is a mixup in their records about is birthday, and that this is a known issue with his insurance.  His insurance has his DOB as Oct 01, 1945. OT recommends I'm to f/u with PCP or neurologist about possibly having had a new CVA about the time when he fell. He and niece state understanding, and OT sends a message to his neurologist.   OT also discusses home safety/living situation with him and OT checks and updates  his muscle strength, then educates on FA and wrist stretches for him to maintain his flexibility. Due to high lateral shoulder pain, OT also suggests 2 shoulder stretches, since he did not tolerate PNF neuro patterns well today. He also reviews in-hand manipulation activity and learns finger extension for strength and coordination as well. (See HEP below for details).   Exercises - Seated Shoulder External Rotation PROM on Table  - 3-4 x daily - 3-5 reps - 15-20 sec hold - Sleeper Stretch  - 3-4 x daily - 5 reps - 15-20 sec hold - Seated Single Arm Shoulder PNF D1 Flexion  - 4-6 x daily - 10-15 reps - Wrist Flexion Stretch  - 4 x daily - 3-5 reps - 15 sec hold - Wrist Extension Stretch Pronated  - 4 x daily - 3-5 reps - 15 hold - Bend and Pull Back Wrist SLOWLY  - 4 x daily - 10-15 reps - Tendon Glides  - 4-6 x daily - 3-5 reps - 2-3 seconds hold - Thumb Opposition  - 4-6 x daily - 10 reps - Seated Single Finger Extension  - 4-6 x daily - 10-15 reps   PATIENT EDUCATION: Education details: See tx section above for details  Person educated: Patient Education method: Veterinary surgeon, Teach back, Handouts  Education comprehension: States and demonstrates understanding, Additional Education required    HOME EXERCISE  PROGRAM: Access Code: [email or web address] URL: [email or web address]    GOALS: Goals reviewed with patient? Yes   SHORT TERM GOALS: (STG required if POC>30 days) Target Date: 07/13/22  Pt will obtain protective, custom orthotic. Goal status: TBD/PRN for wrist pain/instability  2.  Pt will demo/state understanding of initial HEP to improve pain levels and prerequisite motion. Goal status: 07/03/22: Progressing    LONG TERM GOALS: Target Date: 08/10/22  Pt will improve functional ability by decreased impairment per PRWE functional assessment from 19.5/40 to 10/40 or better, for better quality of life. Goal status: INITIAL  2.  Pt will improve grip strength in Rt hand from TBDlbs to at least 40lbs for functional use at home and in IADLs. Goal status: INITIAL  3.  Pt will improve A/ROM in Rt wrist flex & ext from 37/28* respectively, to at least 40* both, to have functional motion for tasks like reach and grasp.  Goal status: INITIAL  4.  Pt will improve strength in Rt wrist flex, ext to at least 4+/5 MMT to have increased functional ability to carry out selfcare and higher-level homecare tasks with no difficulty. Goal status: INITIAL  5.  Pt will improve coordination skills in Rt hand, as seen by better score on 9HPT testing to at least 55sec to have increased functional ability to carry out fine motor tasks (fasteners, etc.) and more complex, coordinated IADLs (meal prep, sports, etc.).  Goal status: INITIAL    ASSESSMENT:  CLINICAL IMPRESSION: 07/03/22: POC is somewhat limited by concurrent shoulder pain. He also states some confusion/denial about stroke symptoms and need to follow-through with OT's specific HEP recommendations.     PLAN: OT FREQUENCY: 2x/week  OT DURATION: 6 weeks  PLANNED INTERVENTIONS: self care/ADL training, therapeutic exercise, therapeutic activity, neuromuscular re-education, manual therapy, passive range of motion, functional mobility  training, splinting, electrical stimulation, ultrasound, fluidotherapy, compression bandaging, moist heat, cryotherapy, contrast bath, patient/family education, and coping strategies training  RECOMMENDED OTHER SERVICES: is getting PT for shoulder tears right now. PT was recommended to assess and tx for LE weakness/stroke signs as well. He was also recommended to  see cardiologist/neurologist about possible stroke evolution.   CONSULTED AND AGREED WITH PLAN OF CARE: Patient and family member/caregiver  PLAN FOR NEXT SESSION:   Have him demo his HEP for knowledge check and to determine progress.  Upgrade to dynamic fnl activities and other neuro rehab techniques, keeping in mind shoulder pain and possible other wrist issues.   Benito Mccreedy, OTR/L, CHT 07/03/2022, 4:15 PM

## 2022-07-04 NOTE — Therapy (Signed)
OUTPATIENT OCCUPATIONAL THERAPY TREATMENT NOTE  Patient Name: Cedrie Skaggs MRN: [id number] DOB:1946-02-02, 76 y.o., male Today's Date: 07/05/2022  PCP: Arthur Holms, NP REFERRING PROVIDER: Leandrew Koyanagi, MD   OT End of Session - 07/05/22 1109     Visit Number 3    Number of Visits 12    Date for OT Re-Evaluation 08/10/22    Authorization Type [employer]    Progress Note Due on Visit 10    OT Start Time 1109    OT Stop Time 1153    OT Time Calculation (min) 44 min    Activity Tolerance Patient tolerated treatment well;Patient limited by fatigue;Patient limited by pain    Behavior During Therapy [facility] for tasks assessed/performed               Past Medical History:  Diagnosis Date   CVA (cerebral vascular accident) [facility])    april 2023   Diabetes mellitus without complication ([place])    Hypertension    History reviewed. No pertinent surgical history. 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 ([place]) 01/14/2022   HFrEF (heart failure with reduced ejection fraction) ([place])    Essential hypertension 01/07/2022   Hyperlipidemia 01/07/2022   Diabetes ([place]) 01/07/2022   Stage 3a chronic kidney disease (CKD) ([place]) 01/07/2022   Transient cerebral ischemia 01/06/2022    ONSET DATE:  July 16th, 2023 (about 12 weeks since injury)   REFERRING DIAG: M25.531 (ICD-10-CM) - Pain in right wrist  THERAPY DIAG:  Chronic right shoulder pain  Muscle weakness (generalized)  Stiffness of right wrist, not elsewhere classified  Other lack of coordination  Acute ischemic left MCA stroke (HCC)  Localized edema  Rationale for Evaluation and Treatment Rehabilitation  PERTINENT HISTORY: Per medical notes, he fell ~ 2 months ago, tearing (complete supraspinatus) Rt RTC, tore Rt TFCC as well as S-L ligament.  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 He states falling ~2 months ago and wrist bend into flexion and he tore ligaments in his wrist. He states his right hand is now swollen and stiff. He also states having a stroke this April (about 6 months ago) that affected Rt side, but he states rehabbing back to "normal" before fall. He states no significant pain at fall and none now- just swollen, tight, stiff and also poorer coordination now. He states not wearing any wrist brace after fall, tried a sling for his shoulder, which he felt "made him worse," so d/c'd it.   His niece is with him today to support him."  PRECAUTIONS: Fall and Other: Rt RTC tears   WEIGHT BEARING RESTRICTIONS Yes caution for Rt Shoulder and wrist   SUBJECTIVE:   SUBJECTIVE STATEMENT: He arrives with niece. States that he's continued to have limiting pain in distal lateral shoulder/triceps with PNF patters.  He's also a bit verbose and off topic, per usual- needs mild - moderate redirection today.    PAIN:  Are you having pain? 0  Rating: 0/10 at rest, up to 0/10 in past week at worst and states not actively avoiding use thereof  FALLS: Has patient fallen in last 6 months? Yes. Number of falls 1 - this injury  LIVING ENVIRONMENT: Lives with: Alone. He states driving since stroke. He uses a cane for stability.    OBJECTIVE: (All objective assessments  below are from initial evaluation on: 06/28/22 unless otherwise specified.)    HAND DOMINANCE: Right   ADLs: Overall ADLs: He states problems with dressing, bathing, turning door handles, and FMS buttoning with Rt hand now.   FUNCTIONAL OUTCOME MEASURES: Eval: Patient Rated Wrist Evaluation (PRWE) (modified to exclude work and recreational activities as he states not doing at baseline): Pain: 0/50; Function: 19.5/40; Total Score: 19.5/90 (Higher Score  =  More Pain and/or Debility)    UPPER EXTREMITY ROM     Shoulder to Wrist AROM Right eval Left eval   Forearm supination 63   Forearm pronation  80   Wrist flexion 37* (tender) (40* PROM)  50*  Wrist extension 28* (40* PROM)  68*  Wrist ulnar deviation 7*   Wrist radial deviation 5*   Functional dart thrower's motion (F-DTM) in ulnar flexion 25*   F-DTM in radial extension  27*   (Blank rows = not tested)   Hand AROM Right eval  Full Fist Ability (or Gap to Distal Palmar Crease) Loose fist (fingers barely touch palm  Thumb Opposition to Small Finger (or Gap) 1.5cm gap and slow motion  Thumb Opposition to Base of Small Finger (or Gap)  unable  (Blank rows = not tested)   UPPER EXTREMITY MMT:    Eval:  NT at eval due to concern for ligament tears, however he was stable and non-painful and this will be tested next session to help "tease out" CVA type symptoms as well. Definite weakness Rt vs Lt.   MMT Right 07/03/22  Elbow flexion 5/5 good tone, poor endurance  Elbow extension 4-/5 lower tone  Forearm supination 4+/5 good tone  Forearm pronation 4+/5 good tone  Wrist flexion 4+/5  Wrist/finger extension 3+/5  (Blank rows = not tested)  HAND FUNCTION: 07/03/22: Rt19# grip strength (no pain); Lt: 118#   Eval: Observed weakness in affected hand.   COORDINATION: 07/03/22: Box & Blocks Test Right 21 Blocks Today (46 is WFL)   Eval: He has observed ataxic motion with finger to nose test, observed slow and poor FMS in Rt hand compared to Lt.  9HPT: Rt 48min 46sec with several pegs dropped.   SENSATION: Eval:  Light touch intact today, he states equal b/l   EDEMA:   07/03/22: Rt hand figure of 8:   48.7cm today  (Lt hand is 48.8cm);  Rt IF base 7.5cm (Lt hand is 7.2cm)   Eval:  Mildly swollen in Rt hand and wrist today  COGNITION: Eval: Overall cognitive status: WFL for evaluation today, though states some memory issues  OBSERVATIONS:   Eval: no facial droop, but Rt sided ataxia present in UE and LE, poor tone and coordination noted as well.  Concern for exacerbation of  CVA as main issue vs ligament tears and orthopedic issues.  (Rt wrist seems stable, negative Watson's Test, no DRUJ instability, negative TFCC shear test)   TODAY'S TREATMENT:  07/05/22: He is a bit verbose and off task today, so OT needs to do some redirection. He also makes several statements that leads OT to believe he has at least mild memory deficits (states he had a "new" lateral shoulder pain, and that he is seeing some of the exercises for the first time, etc.) OT mainly reviews HEP with him today, as he is prone to do "his own things."  He needs mod-max cues for correctness. Due to lateral shoulder/tricep pain with active elbow extension, OT applies K-tape and he states much less pain with  support. Reviewed all exercises as below, niece present and helping cue as well.    Exercises - Seated Shoulder External Rotation PROM on Table  - 3-4 x daily - 3-5 reps - 15-20 sec hold - Sleeper Stretch  - 3-4 x daily - 5 reps - 15-20 sec hold - Seated Single Arm Shoulder PNF D1 Flexion  - 4-6 x daily - 10-15 reps - Wrist Flexion Stretch  - 4 x daily - 3-5 reps - 15 sec hold - Wrist Extension Stretch Pronated  - 4 x daily - 3-5 reps - 15 hold - Bend and Pull Back Wrist SLOWLY  - 4 x daily - 10-15 reps - Tendon Glides  - 4-6 x daily - 3-5 reps - 2-3 seconds hold - Thumb Opposition  - 4-6 x daily - 10 reps - Seated Single Finger Extension  - 4-6 x daily - 10-15 reps   PATIENT EDUCATION: Education details: See tx section above for details  Person educated: Patient Education method: Veterinary surgeon, Teach back, Handouts  Education comprehension: States and demonstrates understanding, Additional Education required    HOME EXERCISE PROGRAM: Access Code: [email or web address] URL: [email or web address]    GOALS: Goals reviewed with patient? Yes   SHORT TERM GOALS: (STG required if POC>30 days) Target Date: 07/13/22  Pt will obtain protective, custom orthotic. Goal status: TBD/PRN for  wrist pain/instability  2.  Pt will demo/state understanding of initial HEP to improve pain levels and prerequisite motion. Goal status: 07/03/22: Progressing    LONG TERM GOALS: Target Date: 08/10/22  Pt will improve functional ability by decreased impairment per PRWE functional assessment from 19.5/40 to 10/40 or better, for better quality of life. Goal status: INITIAL  2.  Pt will improve grip strength in Rt hand from TBDlbs to at least 40lbs for functional use at home and in IADLs. Goal status: INITIAL  3.  Pt will improve A/ROM in Rt wrist flex & ext from 37/28* respectively, to at least 40* both, to have functional motion for tasks like reach and grasp.  Goal status: INITIAL  4.  Pt will improve strength in Rt wrist flex, ext to at least 4+/5 MMT to have increased functional ability to carry out selfcare and higher-level homecare tasks with no difficulty. Goal status: INITIAL  5.  Pt will improve coordination skills in Rt hand, as seen by better score on 9HPT testing to at least 55sec to have increased functional ability to carry out fine motor tasks (fasteners, etc.) and more complex, coordinated IADLs (meal prep, sports, etc.).  Goal status: INITIAL    ASSESSMENT:  CLINICAL IMPRESSION: 07/05/22: It's taking time to teach HEP, as he is off task at times and seems to have at least mild memory issues.  OT will add more functional activities and repetitive use of Rt arm once he can show proficiency with basic HEP.   07/03/22: POC is somewhat limited by concurrent shoulder pain. He also states some confusion/denial about stroke symptoms and need to follow-through with OT's specific HEP recommendations.     PLAN: OT FREQUENCY: 2x/week  OT DURATION: 6 weeks  PLANNED INTERVENTIONS: self care/ADL training, therapeutic exercise, therapeutic activity, neuromuscular re-education, manual therapy, passive range of motion, functional mobility training, splinting, electrical  stimulation, ultrasound, fluidotherapy, compression bandaging, moist heat, cryotherapy, contrast bath, patient/family education, and coping strategies training  RECOMMENDED OTHER SERVICES: is getting PT for shoulder tears right now. PT was recommended to assess and tx for LE weakness/stroke signs as well. He was also recommended  to see cardiologist/neurologist about possible stroke evolution.   CONSULTED AND AGREED WITH PLAN OF CARE: Patient and family member/caregiver  PLAN FOR NEXT SESSION:   Check HEP, consider k-tape again, do  fnl activities, weight bearing as tolerated, developmental reflexive patters all as tolerated to get better muscle activation in lower arm    Have him demo his HEP for knowledge check and to determine progress.  Upgrade to dynamic fnl activities and other neuro rehab techniques, keeping in mind shoulder pain and possible other wrist issues.   Benito Mccreedy, OTR/L, CHT 07/05/2022, 12:52 PM

## 2022-07-04 NOTE — Therapy (Signed)
OUTPATIENT PHYSICAL THERAPY TREATMENT NOTE   Patient Name: Joseph Patrick MRN: [id number] DOB:12-Jul-1946, 76 y.o., male Today's Date: 07/05/2022  PCP: Arthur Holms, NP   REFERRING PROVIDER: Aundra Dubin, PA-C    END OF SESSION:   PT End of Session - 07/05/22 1051     Visit Number 6    Number of Visits 13    Date for PT Re-Evaluation 07/31/22    Authorization Type [employer] MCR    Authorization Time Period 06/19/22-07/20/22    Authorization - Visit Number 6    Authorization - Number of Visits 10    Progress Note Due on Visit 10    PT Start Time 1000    PT Stop Time 1040    PT Time Calculation (min) 40 min    Activity Tolerance Patient tolerated treatment well    Behavior During Therapy [facility] for tasks assessed/performed              Past Medical History:  Diagnosis Date   CVA (cerebral vascular accident) [facility])    april 2023   Diabetes mellitus without complication ([place])    Hypertension    History reviewed. No pertinent surgical history. 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 ([place]) 01/14/2022   HFrEF (heart failure with reduced ejection fraction) ([place])    Essential hypertension 01/07/2022   Hyperlipidemia 01/07/2022   Diabetes ([place]) 01/07/2022   Stage 3a chronic kidney disease (CKD) ([place]) 01/07/2022   Transient cerebral ischemia 01/06/2022    REFERRING DIAG: Chronic right shoulder pain   THERAPY DIAG:  Chronic right shoulder pain  Muscle weakness (generalized)  Rationale for Evaluation and Treatment Rehabilitation  PERTINENT HISTORY: CVA April 2023 resulting in right sided weakness   PRECAUTIONS: Fall   SUBJECTIVE: Patient reports his shoulder is a little achy this morning, he worked it out this morning and ran some hot water on it. States this is the first time he has felt this achiness.  PAIN:  Are you having  pain? Yes:  NPRS scale: 5/10 Pain location: Right shoulder, outer upper arm Pain description: Dull, achy Aggravating factors: Moving the shoulder, occasionally laying on right side Relieving factors: Rest   OBJECTIVE: (objective measures completed at initial evaluation unless otherwise dated) DIAGNOSTIC FINDINGS:  Lumbar MRI 05/25/2022 IMPRESSION: Complete supraspinatus and near complete subscapularis tendon tears with retraction to the glenohumeral joint. No fatty replacement is present but there is edema in the supraspinatus and infraspinatus muscle bellies compatible early atrophy. Severe tendinopathy of the intra-articular long head of biceps and infraspinatus. Bulky acromioclavicular osteoarthritis and subacromial spurring. Moderate glenohumeral osteoarthritis. Debris containing fluid in the subacromial/subdeltoid bursa.   POSTURE: Slight rounded shoulders   UPPER EXTREMITY ROM:    Active ROM Right eval Left eval Right  06/26/22 Right 07/03/22  Shoulder flexion 90 - 100 supine 95 AAROM with pulleys PROM 110 end range pain  Shoulder extension 30 -    Shoulder abduction 80 -    Shoulder internal rotation Unable to reach behind back -    Shoulder external rotation Unable to reach behind head -                 Patient also demonstrates right elbow extension limitation   UPPER EXTREMITY MMT: Strength not formally assessed for right shoulder, he exhibits significant strength deficit of the right shoulder with inability to hold against resistance with  shoulder external rotation or elevation due to weakness   JOINT MOBILITY TESTING:  Hypomoblity of the right GHJ   PALPATION:  Non tender to palpation right shoulder               TODAY'S TREATMENT:  [facility] Adult PT Treatment:                                                DATE: 07/05/22 Therapeutic Exercise: UBE L1 x 4 min (2 fwd/bwd) while taking subjective Supine dowel shoulder press x 10 Supine shoulder press with 1# x  5 Sidelying shoulder abduction with elbow bent 2 x 10 Sidelying ER 2 x 10 Seated overhead pulley x 2 min Seated single arm row with red 2 x 20 on right Manual Therapy: Right GHJ mobs  Right shoulder PROM   [facility] Adult PT Treatment:                                                DATE: 07/03/22 Therapeutic Exercise: UBE L1 forwad x 2 min, back x 2 min [place]- increased shoulder pain Supine on incline wedge- chest press x 15 with cane  [place] AAROM IR/ER with cane Supine in wedge AROM RT chest press x 5 fatigues, clasped hand assist x 15 Supine ER/IR rhythmic stabilization Supine on wedge Elbow flexion AROM - increased pain Supine isometric elbow extension  Supine shoulder ext isonemtric Manual Therapy: Passive right shoulder ROM flexion, abduction, IR, ER   [facility] Adult PT Treatment:                                                DATE: 06/28/22 Therapeutic Exercise: Supine assisted into shoulder flexion 90- hold then small circles unassisted x 60 sec  Supine AROM RT chest press 10 x 2  Supine neutral with elbow flexed- rhythmic stabilization and isometric ER and IR - PTA manual resist  Supine dowel shoulder flexion x 10, dowel pullovers  Clasped hand pullovers supine  10 x 2  Supine serratus punch x 10 - arm supported by PTA Isometric shoulder extension supine Supine bicep curls- manual resist  Supine tricep press- manual resist Supine Cane AAROM ER/IR Seated scap retract  Seated cane shoulder ER x 10 Seated shoulder flexion slides Seated shoulder ER/IR slides on table , circles Seated yellow band ER , IR to fatigue   PATIENT EDUCATION: Education details: HEP Person educated: Patient Education method: Consulting civil engineer, Media planner, Corporate treasurer cues, Verbal cues Education comprehension: verbalized understanding, returned demonstration, verbal cues required, tactile cues required, and needs further education   HOME EXERCISE PROGRAM: Access Code: [email or web address]      ASSESSMENT: CLINICAL IMPRESSION: Patient tolerated therapy well with no adverse effects. He did report arriving lateral upper arm discomfort that he developed this morning and occurred with all exercise this visit. He also noted more popping of the right shoulder that was audible with passive and active motion. He did report reduced pain with GHJ distraction. No changes made to HEP this visit. Patient would benefit from continued skilled PT to progress his mobility and strength in order  to reduce pain and maximize functional ability.      OBJECTIVE IMPAIRMENTS decreased activity tolerance, decreased ROM, decreased strength, postural dysfunction, and pain.    ACTIVITY LIMITATIONS carrying, lifting, dressing, reach over head, and hygiene/grooming   PARTICIPATION LIMITATIONS: meal prep, cleaning, shopping, community activity, and yard work   [place], Past/current experiences, Time since onset of injury/illness/exacerbation, and 3+ comorbidities: see above  are also affecting patient's functional outcome.      GOALS: Goals reviewed with patient? Yes   SHORT TERM GOALS: Target date: 07/10/2022    Patient will be I with initial HEP in order to progress with therapy. Baseline: HEP provided at eval Goal status: MET   2.  Patient will report 50% improvement in pain at night in order to improve sleeping ability Baseline: patient reports pain will wake him on a nightly basis Goal status: INITIAL   LONG TERM GOALS: Target date: 07/31/2022   Patient will be I with final HEP to maintain progress from PT. Baseline: HEP provided at eval Goal status: INITIAL   2.  Patient will demonstrate right shoulder active elevation >/= 120 deg in order to improve functional use of right arm with overhead tasks and self care  Baseline: 90 deg shoulder flexion Goal status: INITIAL   3.  Patient will report </= 1/10 pain level with all activities using the right arm to reduce functional  limitations Baseline: 2/10 pain Goal status: INITIAL   4.  Patient will report no more than minimal limitation with performing household activities, dressing, and all self care tasks in order to indicate improved functional ability Baseline: patient reports inability to perform some household and seld care tasks Goal status: INITIAL     PLAN: PT FREQUENCY: 1-2x/week   PT DURATION: 6 weeks   PLANNED INTERVENTIONS: Therapeutic exercises, Therapeutic activity, Neuromuscular re-education, Balance training, Gait training, Patient/Family education, Self Care, Joint mobilization, Joint manipulation, Aquatic Therapy, Dry Needling, Electrical stimulation, Cryotherapy, Moist heat, Taping, Manual therapy, and Re-evaluation   PLAN FOR NEXT SESSION: Review HEP and progress PRN, right shoulder mobs/PROM, progress AAROM to AROM as tolerated, progress shoulder and parascapular strengthening as tolerated       Hilda Blades, PT, DPT, LAT, ATC 07/05/22  10:52 AM Phone: [phone] Fax: [phone]

## 2022-07-05 ENCOUNTER — Other Ambulatory Visit: Payer: Self-pay

## 2022-07-05 ENCOUNTER — Ambulatory Visit: Payer: Medicare HMO | Admitting: Rehabilitative and Restorative Service Providers"

## 2022-07-05 ENCOUNTER — Encounter: Payer: Self-pay | Admitting: Physical Therapy

## 2022-07-05 ENCOUNTER — Encounter: Payer: Self-pay | Admitting: Rehabilitative and Restorative Service Providers"

## 2022-07-05 ENCOUNTER — Ambulatory Visit: Payer: Medicare HMO | Admitting: Physical Therapy

## 2022-07-05 DIAGNOSIS — G8929 Other chronic pain: Secondary | ICD-10-CM

## 2022-07-05 DIAGNOSIS — R278 Other lack of coordination: Secondary | ICD-10-CM | POA: Diagnosis not present

## 2022-07-05 DIAGNOSIS — I63512 Cerebral infarction due to unspecified occlusion or stenosis of left middle cerebral artery: Secondary | ICD-10-CM

## 2022-07-05 DIAGNOSIS — M25631 Stiffness of right wrist, not elsewhere classified: Secondary | ICD-10-CM

## 2022-07-05 DIAGNOSIS — M25511 Pain in right shoulder: Secondary | ICD-10-CM | POA: Diagnosis not present

## 2022-07-05 DIAGNOSIS — R600 Localized edema: Secondary | ICD-10-CM

## 2022-07-05 DIAGNOSIS — M6281 Muscle weakness (generalized): Secondary | ICD-10-CM

## 2022-07-09 NOTE — Progress Notes (Signed)
Cardiology Office Note:    Date:  07/12/2022   ID:  Joseph Patrick, DOB 1945/10/03, MRN [id number]  PCP:  Arthur Holms, NP  [place] Providers Cardiologist:  Jenkins Rouge, MD    Referring MD: Arthur Holms, NP   Chief Complaint:  No chief complaint on file.    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 male with the above problem list.  BP labile.  Renal arterial ultrasound no RAS Cardiac PET was ordered by Richardson Dopp but never done since June  He returns for follow-up.   He is here with his wife.  He continues to do well without chest pain, shortness of breath, syncope, orthopnea, leg edema.  He still has right-sided weakness.  This is improving but he is concerned about his hand weakness from stroke  Getting regular PT/OT still  Hoping to get enough strength and coordination back to golf Has a whole set Of [employer] he bought just before stroke  Discussed need to reschedule PET/CT to r/o CAD given low EF    Past Medical History:  Diagnosis Date   CVA (cerebral vascular accident) [facility])    april 2023   Diabetes mellitus without complication ([place])    Hypertension    Current  Medications: Current Meds  Medication Sig   aspirin EC 81 MG tablet Take 1 tablet (81 mg total) by mouth daily. Swallow whole.   atorvastatin (LIPITOR) 40 MG tablet Take 1 tablet (40 mg total) by mouth daily.   carvedilol (COREG) 25 MG tablet Take 1 tablet (25 mg total) by mouth 2 (two) times daily.   Cholecalciferol (VITAMIN D3 PO) Take 1 capsule by mouth daily.   Coenzyme Q10 (COQ10) 400 MG CAPS Take 1 capsule by mouth daily at 6 (six) AM.   hydrALAZINE (APRESOLINE) 100 MG tablet TAKE 1 TABLET BY MOUTH 3 TIMES DAILY.   isosorbide mononitrate (IMDUR) 120 MG 24 hr tablet Take 1 tablet (120 mg total) by mouth daily.   Misc Natural Products (PUMPKIN SEED OIL) CAPS Take 2 capsules by mouth 2 (two) times daily.   sacubitril-valsartan (ENTRESTO) 97-103 MG Take 1 tablet by mouth 2 (two) times daily.   spironolactone (ALDACTONE) 50 MG tablet Take 0.5 tablets (25 mg total) by mouth daily.   [DISCONTINUED] carvedilol (COREG) 12.5 MG tablet Take 12.5 mg by mouth daily.    Allergies:   Amlodipine   Social History   Tobacco Use   Smoking status: Never   Smokeless tobacco: Never  Vaping Use   Vaping Use: Never used  Substance Use Topics   Alcohol use: Never   Drug use: Never    Family Hx: The patient's family history includes Heart failure in his sister.  Review of Systems  Gastrointestinal:  Negative for  hematochezia.  Genitourinary:  Negative for hematuria.     EKGs/Labs/Other Test Reviewed:    EKG:  EKG is not ordered today.  The ekg ordered today demonstrates n/a  Recent Labs: 01/15/2022: TSH 0.854 02/01/2022: Magnesium 2.3 02/14/2022: ALT 38 02/15/2022: Hemoglobin 13.7; Platelets 189 06/07/2022: BUN 24; Creatinine, Ser 1.42; Potassium 4.2; Sodium 138   Recent Lipid Panel Recent Labs    01/07/22 0551  CHOL 186  TRIG 82  HDL 43  VLDL 16  LDLCALC 127*          Physical Exam:    VS:  BP (!) 144/70   Pulse 71   Ht 5\' 9"  (1.753 m)   Wt 174 lb 12.8 oz (79.3 kg)   SpO2 96%    BMI 25.81 kg/m     Wt Readings from Last 3 Encounters:  07/12/22 174 lb 12.8 oz (79.3 kg)  06/15/22 170 lb 12.8 oz (77.5 kg)  06/04/22 169 lb (76.7 kg)    Affect appropriate Chronically ill male  HEENT: normal Neck supple with no adenopathy JVP normal no bruits no thyromegaly Lungs clear with no wheezing and good diaphragmatic motion Heart:  S1/S2 no murmur, no rub, gallop or click PMI normal Abdomen: benighn, BS positve, no tenderness, no AAA no bruit.  No HSM or HJR Distal pulses intact with no bruits No edema Neuro right arm/hand weakness RLE weakness in brace  Skin warm and dry Right partial hemiparesis worse in hand Walks with cane        ASSESSMENT & PLAN:    Essential hypertension  Labile, ? Allergy to norvasc Continue Entresto, imdur, hydralazine and aldactone as well as coreg Renal duplex negative for RAS    HFrEF (heart failure with reduced ejection fraction) (HCC) NYHA I-II.  EF 25-30.  Volume status currently stable.  Adjust carvedilol to 18.7 mg twice daily as noted.  Continue Farxiga 10 mg daily, hydralazine 100 mg 3 times a day, isosorbide mononitrate 120 mg daily, Entresto 97/103 mg twice daily, spironolactone 25 mg daily. Latter dose decreased due to elevated K/Cr   Dilated cardiomyopathy ([place]) Cardiomyopathy likely related to uncontrolled hypertension.  At last visit, we discussed proceeding with stress testing.  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  He is still bothered by his hand weakness.  I have reminded him to continue his home exercises and discuss his weakness with neurology at his follow-up visit He thinks he had a 2 nd stroke but never got re-imaged Told him to discuss with Dr Leonie Man  Signed, Jenkins Rouge, MD  07/12/2022 4:03 PM    [address], [place], [place]  [address] Phone: [phone]; Fax: [phone]

## 2022-07-09 NOTE — Therapy (Signed)
OUTPATIENT OCCUPATIONAL THERAPY TREATMENT NOTE  Patient Name: Joseph Patrick MRN: [id number] DOB:Feb 23, 1946, 76 y.o., male Today's Date: 07/10/2022  PCP: Loura Back, NP REFERRING PROVIDER: Tarry Kos, MD   OT End of Session - 07/10/22 1019     Visit Number 4    Number of Visits 12    Date for OT Re-Evaluation 08/10/22    Authorization Type [employer]    Progress Note Due on Visit 10    OT Start Time 1019    OT Stop Time 1102    OT Time Calculation (min) 43 min    Activity Tolerance Patient tolerated treatment well;Patient limited by fatigue;Patient limited by pain    Behavior During Therapy [facility] for tasks assessed/performed              Past Medical History:  Diagnosis Date   CVA (cerebral vascular accident) [facility])    april 2023   Diabetes mellitus without complication (HCC)    Hypertension    History reviewed. No pertinent surgical history. 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) 01/14/2022   HFrEF (heart failure with reduced ejection fraction) (HCC)    Essential hypertension 01/07/2022   Hyperlipidemia 01/07/2022   Diabetes (HCC) 01/07/2022   Stage 3a chronic kidney disease (CKD) (HCC) 01/07/2022   Transient cerebral ischemia 01/06/2022    ONSET DATE:  July 16th, 2023 (about 12 weeks since injury)   REFERRING DIAG: M25.531 (ICD-10-CM) - Pain in right wrist  THERAPY DIAG:  Chronic right shoulder pain  Muscle weakness (generalized)  Stiffness of right wrist, not elsewhere classified  Other lack of coordination  Acute ischemic left MCA stroke (HCC)  Localized edema  Rationale for Evaluation and Treatment Rehabilitation  PERTINENT HISTORY: Per medical notes, he fell ~ 2 months ago, tearing (complete supraspinatus) Rt RTC, tore Rt TFCC as well as S-L ligament.  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 He states falling ~2 months ago and wrist bend into flexion and he tore ligaments in his wrist. He states his right hand is now swollen and stiff. He also states having a stroke this April (about 6 months ago) that affected Rt side, but he states rehabbing back to "normal" before fall. He states no significant pain at fall and none now- just swollen, tight, stiff and also poorer coordination now. He states not wearing any wrist brace after fall, tried a sling for his shoulder, which he felt "made him worse," so d/c'd it.   His niece is with him today to support him."  PRECAUTIONS: Fall and Other: Rt RTC tears   WEIGHT BEARING RESTRICTIONS Yes caution for Rt Shoulder and wrist   SUBJECTIVE:   SUBJECTIVE STATEMENT: He arrives with niece. States been feeling "down" recently low energy. He says, "I felt this way after my other stroke."  Has cardiology appt this Thurs. He is urged to get in with neurologist as well, due to likely recurrence of stroke.   PAIN:  Are you having pain? 0  Rating: 0/10 at rest, up to 0/10 in past week at worst and states not actively avoiding use thereof  FALLS: Has patient fallen in last 6 months? Yes. Number of falls 1 - this injury  LIVING ENVIRONMENT: Lives with: Alone. He states driving since stroke. He uses a cane for stability.  OBJECTIVE: (All objective assessments below are from initial evaluation on: 06/28/22 unless otherwise specified.)    HAND DOMINANCE: Right   ADLs: Overall ADLs: He states problems with dressing, bathing, turning door handles, and FMS buttoning with Rt hand now.   FUNCTIONAL OUTCOME MEASURES: Eval: Patient Rated Wrist Evaluation (PRWE) (modified to exclude work and recreational activities as he states not doing at baseline): Pain: 0/50; Function: 19.5/40; Total Score: 19.5/90 (Higher Score  =  More Pain and/or Debility)    UPPER EXTREMITY ROM     Shoulder to Wrist AROM  Right eval Left eval Rt 07/10/22  Forearm supination 63    Forearm pronation  80    Wrist flexion 37* (tender) (40* PROM)  50* 47  Wrist extension 28* (40* PROM)  68* 36  Wrist ulnar deviation 7*  19  Wrist radial deviation 5*  14  Functional dart thrower's motion (F-DTM) in ulnar flexion 25*    F-DTM in radial extension  27*    (Blank rows = not tested)   Hand AROM Right eval  Full Fist Ability (or Gap to Distal Palmar Crease) Loose fist (fingers barely touch palm  Thumb Opposition to Small Finger (or Gap) 1.5cm gap and slow motion  Thumb Opposition to Base of Small Finger (or Gap)  unable  (Blank rows = not tested)   UPPER EXTREMITY MMT:    Eval:  NT at eval due to concern for ligament tears, however he was stable and non-painful and this will be tested next session to help "tease out" CVA type symptoms as well. Definite weakness Rt vs Lt.   MMT Right 07/03/22  Elbow flexion 5/5 good tone, poor endurance  Elbow extension 4-/5 lower tone  Forearm supination 4+/5 good tone  Forearm pronation 4+/5 good tone  Wrist flexion 4+/5  Wrist/finger extension 3+/5  (Blank rows = not tested)  HAND FUNCTION: 07/03/22: Rt 19# grip strength (no pain); Lt: 118#   Eval: Observed weakness in affected hand.   COORDINATION: 07/03/22: Box & Blocks Test Right 21 Blocks Today (46 is WFL)   Eval: He has observed ataxic motion with finger to nose test, observed slow and poor FMS in Rt hand compared to Lt.  9HPT: Rt 64min 46sec with several pegs dropped.   SENSATION: Eval:  Light touch intact today, he states equal b/l   EDEMA:   07/03/22: Rt hand figure of 8:   48.7cm today  (Lt hand is 48.8cm);  Rt IF base 7.5cm (Lt hand is 7.2cm)   Eval:  Mildly swollen in Rt hand and wrist today  COGNITION: Eval: Overall cognitive status: WFL for evaluation today, though states some memory issues  OBSERVATIONS:   Eval: no facial droop, but Rt sided ataxia present in UE and LE, poor tone and  coordination noted as well.  Concern for exacerbation of CVA as main issue vs ligament tears and orthopedic issues.  (Rt wrist seems stable, negative Watson's Test, no DRUJ instability, negative TFCC shear test)   TODAY'S TREATMENT:  07/10/22: He reviews and performs stretches for lateral shoulder pain before reviewing PNF neuro pattern D1, AROM for elbow, wrist, hand. OT then supplies hand strengthening putty activities and red t-band for wrist flex, ext strengthening. He tolerates well, no pain in wrist. New HEP as below. 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  His niece is cuing him and trying to help. OT also applies k-tape for stability to elbow/lateral  shoulder as he states this helped him when last done.   Exercises - Full Fist  - 2-3 x daily - 5 reps - Finger Extension "Pizza!"   - 2-3 x daily - 5 reps - Finger Pinch and Pull with Putty  - 2-3 x daily - 5 reps - Wrist Extension with Resistance  - 2-4 x daily - 1-2 sets - 10-15 reps - Wrist Flexion with Resistance  - 2-4 x daily - 1-2 sets - 10-15 reps   PATIENT EDUCATION: Education details: See tx section above for details  Person educated: Patient Education method: Veterinary surgeon, Teach back, Handouts  Education comprehension: States and demonstrates understanding, Additional Education required    HOME EXERCISE PROGRAM: Access Code: [email or web address] URL: [email or web address]    GOALS: Goals reviewed with patient? Yes   SHORT TERM GOALS: (STG required if POC>30 days) Target Date: 07/13/22  Pt will obtain protective, custom orthotic. Goal status: TBD/PRN for wrist pain/instability  2.  Pt will demo/state understanding of initial HEP to improve pain levels and prerequisite motion. Goal status: 07/03/22: Progressing    LONG TERM GOALS: Target Date: 08/10/22  Pt will improve functional ability by decreased impairment per PRWE functional  assessment from 19.5/40 to 10/40 or better, for better quality of life. Goal status: INITIAL  2.  Pt will improve grip strength in Rt hand from TBDlbs to at least 40lbs for functional use at home and in IADLs. Goal status: INITIAL  3.  Pt will improve A/ROM in Rt wrist flex & ext from 37/28* respectively, to at least 40* both, to have functional motion for tasks like reach and grasp.  Goal status: INITIAL  4.  Pt will improve strength in Rt wrist flex, ext to at least 4+/5 MMT to have increased functional ability to carry out selfcare and higher-level homecare tasks with no difficulty. Goal status: INITIAL  5.  Pt will improve coordination skills in Rt hand, as seen by better score on 9HPT testing to at least 55sec to have increased functional ability to carry out fine motor tasks (fasteners, etc.) and more complex, coordinated IADLs (meal prep, sports, etc.).  Goal status: INITIAL    ASSESSMENT:  CLINICAL IMPRESSION: 07/10/22: He continues to present as mild to moderate R-sided hemiplegia post-stroke. His lethargy and lack of complete follow-through with HEP is detrimental to his recovery. He also hasn't been back to see neurologist yet. His lateral shoulder pain/tightness is a limiting factor as well.    PLAN: OT FREQUENCY: 2x/week  OT DURATION: 6 weeks  PLANNED INTERVENTIONS: self care/ADL training, therapeutic exercise, therapeutic activity, neuromuscular re-education, manual therapy, passive range of motion, functional mobility training, splinting, electrical stimulation, ultrasound, fluidotherapy, compression bandaging, moist heat, cryotherapy, contrast bath, patient/family education, and coping strategies training  RECOMMENDED OTHER SERVICES: is getting PT for shoulder tears right now. PT was recommended to assess and tx for LE weakness/stroke signs as well. He was also recommended to see cardiologist/neurologist about possible stroke evolution.   CONSULTED AND AGREED WITH PLAN  OF CARE: Patient and family member/caregiver  PLAN FOR NEXT SESSION:   Check HEP upgrades (hand, wrist strength), do fnl activities, weight bearing as tolerated, developmental reflexive patters all as tolerated to get better muscle activation in lower arm     Xzavior Reinig, OTR/L, CHT 07/10/2022, 1:26 PM

## 2022-07-10 ENCOUNTER — Ambulatory Visit: Payer: Medicare HMO | Admitting: Physical Therapy

## 2022-07-10 ENCOUNTER — Encounter: Payer: Self-pay | Admitting: Rehabilitative and Restorative Service Providers"

## 2022-07-10 ENCOUNTER — Other Ambulatory Visit: Payer: Self-pay

## 2022-07-10 ENCOUNTER — Ambulatory Visit: Payer: Medicare HMO | Admitting: Rehabilitative and Restorative Service Providers"

## 2022-07-10 ENCOUNTER — Encounter: Payer: Self-pay | Admitting: Physical Therapy

## 2022-07-10 ENCOUNTER — Telehealth: Payer: Self-pay

## 2022-07-10 DIAGNOSIS — M6281 Muscle weakness (generalized): Secondary | ICD-10-CM | POA: Diagnosis not present

## 2022-07-10 DIAGNOSIS — M25511 Pain in right shoulder: Secondary | ICD-10-CM

## 2022-07-10 DIAGNOSIS — R600 Localized edema: Secondary | ICD-10-CM

## 2022-07-10 DIAGNOSIS — R278 Other lack of coordination: Secondary | ICD-10-CM

## 2022-07-10 DIAGNOSIS — M25631 Stiffness of right wrist, not elsewhere classified: Secondary | ICD-10-CM | POA: Diagnosis not present

## 2022-07-10 DIAGNOSIS — I63512 Cerebral infarction due to unspecified occlusion or stenosis of left middle cerebral artery: Secondary | ICD-10-CM

## 2022-07-10 DIAGNOSIS — G8929 Other chronic pain: Secondary | ICD-10-CM

## 2022-07-10 NOTE — Patient Instructions (Signed)
Access Code: [email or web address] URL: [email or web address] Date: 07/10/2022 Prepared by: Hilda Blades  Exercises - Supine Shoulder Flexion Extension AAROM with Dowel  - 2 x daily - 2 sets - 10 reps - 5 seconds hold - Supine Shoulder Protraction with Dowel  - 2 x daily - 2 sets - 10 reps - Seated Shoulder External Rotation AAROM with Cane and Hand in Neutral  - 2 x daily - 2 sets - 10 reps - Seated Shoulder Flexion Towel Slide at Table Top  - 2 x daily - 2 sets - 10 reps - 5 seconds hold - Standing Row with Anchored Resistance  - 2 x daily - 2 sets - 20 reps - Scapular Retraction with Resistance Advanced  - 2 x daily - 2 sets - 20 reps

## 2022-07-10 NOTE — Therapy (Signed)
OUTPATIENT PHYSICAL THERAPY TREATMENT NOTE   Patient Name: Joseph Patrick MRN: [id number] DOB:1946-01-06, 76 y.o., male Today's Date: 07/10/2022  PCP: Arthur Holms, NP   REFERRING PROVIDER: Aundra Dubin, PA-C    END OF SESSION:   PT End of Session - 07/10/22 0941     Visit Number 7    Number of Visits 13    Date for PT Re-Evaluation 07/31/22    Authorization Type [employer] MCR    Authorization Time Period 06/19/22-07/20/22    Authorization - Visit Number 7    Authorization - Number of Visits 10    Progress Note Due on Visit 10    PT Start Time 0915    PT Stop Time 0955    PT Time Calculation (min) 40 min    Activity Tolerance Patient tolerated treatment well    Behavior During Therapy [facility] for tasks assessed/performed               Past Medical History:  Diagnosis Date   CVA (cerebral vascular accident) [facility])    april 2023   Diabetes mellitus without complication ([place])    Hypertension    History reviewed. No pertinent surgical history. 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 ([place]) 01/14/2022   HFrEF (heart failure with reduced ejection fraction) ([place])    Essential hypertension 01/07/2022   Hyperlipidemia 01/07/2022   Diabetes ([place]) 01/07/2022   Stage 3a chronic kidney disease (CKD) ([place]) 01/07/2022   Transient cerebral ischemia 01/06/2022    REFERRING DIAG: Chronic right shoulder pain   THERAPY DIAG:  Chronic right shoulder pain  Muscle weakness (generalized)  Rationale for Evaluation and Treatment Rehabilitation  PERTINENT HISTORY: CVA April 2023 resulting in right sided weakness   PRECAUTIONS: Fall    SUBJECTIVE: Patient reports he is still having the pain on the outside of the shoulder and down to the outer elbow. He didn't do many exercises over the weekend because he was sore.   PAIN:  Are you having pain?  Yes:  NPRS scale: 4/10 Pain location: Right shoulder, outer upper arm Pain description: Dull, achy Aggravating factors: Moving the shoulder, occasionally laying on right side Relieving factors: Rest   OBJECTIVE: (objective measures completed at initial evaluation unless otherwise dated) DIAGNOSTIC FINDINGS:  IMPRESSION: Complete supraspinatus and near complete subscapularis tendon tears with retraction to the glenohumeral joint. No fatty replacement is present but there is edema in the supraspinatus and infraspinatus muscle bellies compatible early atrophy. Severe tendinopathy of the intra-articular long head of biceps and infraspinatus. Bulky acromioclavicular osteoarthritis and subacromial spurring. Moderate glenohumeral osteoarthritis. Debris containing fluid in the subacromial/subdeltoid bursa.   POSTURE: Slight rounded shoulders   UPPER EXTREMITY ROM:    Active ROM Right eval Left eval Right  06/26/22 Right 07/03/22 Right 07/10/2022  Shoulder flexion 90 - 100 supine 95 AAROM with pulleys PROM 110 end range pain 90  Shoulder extension 30 -     Shoulder abduction 80 -     Shoulder internal rotation Unable to reach behind back -     Shoulder external rotation Unable to reach behind head -                  Patient also demonstrates right elbow extension limitation   UPPER EXTREMITY MMT: Strength not formally assessed for right shoulder, he exhibits significant strength deficit of the right shoulder with inability  to hold against resistance with shoulder external rotation or elevation due to weakness   JOINT MOBILITY TESTING:  Hypomoblity of the right GHJ   PALPATION:  Non tender to palpation right shoulder               TODAY'S TREATMENT:  [facility] Adult PT Treatment:                                                DATE: 07/10/22 Therapeutic Exercise: UBE L1 x 4 min (2 fwd/bwd) while taking subjective Seated overhead pulley x 2 min Sidelying shoulder abduction with elbow  bent 2 x 15 Sidelying ER 2 x 10 Supine dowel shoulder press x 15, with 1# x 10 Supine shoulder press with 1# and overhead reach 2 x 10 Standing row with green 2 x 20 Standing extension with red 2 x 20 Manual Therapy: Right GHJ and scapulothoracic mobs  Right shoulder PROM   [facility] Adult PT Treatment:                                                DATE: 07/05/22 Therapeutic Exercise: UBE L1 x 4 min (2 fwd/bwd) while taking subjective Supine dowel shoulder press x 10 Supine shoulder press with 1# x 5 Sidelying shoulder abduction with elbow bent 2 x 10 Sidelying ER 2 x 10 Seated overhead pulley x 2 min Seated single arm row with red 2 x 20 on right Manual Therapy: Right GHJ mobs  Right shoulder PROM  [facility] Adult PT Treatment:                                                DATE: 07/03/22 Therapeutic Exercise: UBE L1 forwad x 2 min, back x 2 min Pulleys OH- increased shoulder pain Supine on incline wedge- chest press x 15 with cane  [place]ou[place] AAROM IR/ER with cane Supine in wedge AROM RT chest press x 5 fatigues, clasped hand assist x 15 Supine ER/IR rhythmic stabilization Supine on wedge Elbow flexion AROM - increased pain Supine isometric elbow extension  Supine shoulder ext isonemtric Manual Therapy: Passive right shoulder ROM flexion, abduction, IR, ER    PATIENT EDUCATION: Education details: HEP UPDATE Person educated: Patient Education method: Explanation, Demonstration, Tactile cues, Verbal cues Education comprehension: verbalized understanding, returned demonstration, verbal cues required, tactile cues required, and needs further education   HOME EXERCISE PROGRAM: Access Code: [email or web address]     ASSESSMENT: CLINICAL IMPRESSION: Patient tolerated therapy well with no adverse effects. Therapy continues to focus on progress shoulder mobility and strength as tolerated. He exhibits increased crepitus of the right shoulder with motion this visit. He continues to demonstrate  significant limitations in his shoulder active motion and strength. He notes shoulder and lateral upper arm discomfort with active motion. No changes to HEP this visit. Patient would benefit from continued skilled PT to progress his mobility and strength in order to reduce pain and maximize functional ability.      OBJECTIVE IMPAIRMENTS decreased activity tolerance, decreased ROM, decreased strength, postural dysfunction, and pain.    ACTIVITY LIMITATIONS carrying, lifting, dressing, reach  over head, and hygiene/grooming   PARTICIPATION LIMITATIONS: meal prep, cleaning, shopping, community activity, and yard work   [place], Past/current experiences, Time since onset of injury/illness/exacerbation, and 3+ comorbidities: see above  are also affecting patient's functional outcome.      GOALS: Goals reviewed with patient? Yes   SHORT TERM GOALS: Target date: 07/10/2022    Patient will be I with initial HEP in order to progress with therapy. Baseline: HEP provided at eval Goal status: MET   2.  Patient will report 50% improvement in pain at night in order to improve sleeping ability Baseline: patient reports pain will wake him on a nightly basis 07/10/2022: patient continues to report nocturnal pain that disrupts sleep Goal status: ONGOING   LONG TERM GOALS: Target date: 07/31/2022   Patient will be I with final HEP to maintain progress from PT. Baseline: HEP provided at eval Goal status: INITIAL   2.  Patient will demonstrate right shoulder active elevation >/= 120 deg in order to improve functional use of right arm with overhead tasks and self care  Baseline: 90 deg shoulder flexion Goal status: INITIAL   3.  Patient will report </= 1/10 pain level with all activities using the right arm to reduce functional limitations Baseline: 2/10 pain Goal status: INITIAL   4.  Patient will report no more than minimal limitation with performing household activities,  dressing, and all self care tasks in order to indicate improved functional ability Baseline: patient reports inability to perform some household and seld care tasks Goal status: INITIAL     PLAN: PT FREQUENCY: 1-2x/week   PT DURATION: 6 weeks   PLANNED INTERVENTIONS: Therapeutic exercises, Therapeutic activity, Neuromuscular re-education, Balance training, Gait training, Patient/Family education, Self Care, Joint mobilization, Joint manipulation, Aquatic Therapy, Dry Needling, Electrical stimulation, Cryotherapy, Moist heat, Taping, Manual therapy, and Re-evaluation   PLAN FOR NEXT SESSION: Review HEP and progress PRN, right shoulder mobs/PROM, progress AAROM to AROM as tolerated, progress shoulder and parascapular strengthening as tolerated       Hilda Blades, PT, DPT, LAT, ATC 07/10/22  10:06 AM Phone: [phone] Fax: [phone]

## 2022-07-11 NOTE — Therapy (Signed)
OUTPATIENT OCCUPATIONAL THERAPY TREATMENT NOTE  Patient Name: Joseph Patrick MRN: [id number] DOB:04-Aug-1946, 76 y.o., male Today's Date: 07/12/2022  PCP: Arthur Holms, NP REFERRING PROVIDER: Leandrew Koyanagi, MD   OT End of Session - 07/12/22 1059     Visit Number 5    Number of Visits 12    Date for OT Re-Evaluation 08/10/22    Authorization Type [employer]    Progress Note Due on Visit 10    OT Start Time 1101    OT Stop Time 1147    OT Time Calculation (min) 46 min    Activity Tolerance Patient tolerated treatment well;Patient limited by fatigue;Patient limited by pain    Behavior During Therapy [facility] for tasks assessed/performed             Past Medical History:  Diagnosis Date   CVA (cerebral vascular accident) [facility])    april 2023   Diabetes mellitus without complication ([place])    Hypertension    History reviewed. No pertinent surgical history. 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 ([place]) 01/14/2022   HFrEF (heart failure with reduced ejection fraction) ([place])    Essential hypertension 01/07/2022   Hyperlipidemia 01/07/2022   Diabetes ([place]) 01/07/2022   Stage 3a chronic kidney disease (CKD) ([place]) 01/07/2022   Transient cerebral ischemia 01/06/2022    ONSET DATE:  July 16th, 2023 (about 12 weeks since injury)   REFERRING DIAG: M25.531 (ICD-10-CM) - Pain in right wrist  THERAPY DIAG:  Chronic right shoulder pain  Muscle weakness (generalized)  Stiffness of right wrist, not elsewhere classified  Other lack of coordination  Acute ischemic left MCA stroke (HCC)  Localized edema  Rationale for Evaluation and Treatment Rehabilitation  PERTINENT HISTORY: Per medical notes, he fell ~ 2 months ago, tearing (complete supraspinatus) Rt RTC, tore Rt TFCC as well as S-L ligament.  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 He states falling ~2 months ago and wrist bend into flexion and he tore ligaments in his wrist. He states his right hand is now swollen and stiff. He also states having a stroke this April (about 6 months ago) that affected Rt side, but he states rehabbing back to "normal" before fall. He states no significant pain at fall and none now- just swollen, tight, stiff and also poorer coordination now. He states not wearing any wrist brace after fall, tried a sling for his shoulder, which he felt "made him worse," so d/c'd it.   His niece is with him today to support him."  PRECAUTIONS: Fall and Other: Rt RTC tears   WEIGHT BEARING RESTRICTIONS Yes caution for Rt Shoulder and wrist   SUBJECTIVE:   SUBJECTIVE STATEMENT: He arrives with niece. States feeling more denseness of stroke symptoms now. He states doing HEP and other exercises "instinctively" now.    PAIN:  Are you having pain? 0 - shoulder a little sore after PT  Rating: 0/10 at rest, up to 0/10 in past week at worst and states not actively avoiding use thereof  FALLS: Has patient fallen in last 6 months? Yes. Number of falls 1 - this injury  LIVING ENVIRONMENT: Lives with: Alone. He states driving since stroke. He uses a cane for stability.    OBJECTIVE: (All objective assessments below are from initial evaluation on: 06/28/22 unless otherwise specified.)  HAND DOMINANCE: Right   ADLs: Overall ADLs: He states problems with dressing, bathing, turning door handles, and FMS buttoning with Rt hand now.   FUNCTIONAL OUTCOME MEASURES: Eval: Patient Rated Wrist Evaluation (PRWE) (modified to exclude work and recreational activities as he states not doing at baseline): Pain: 0/50; Function: 19.5/40; Total Score: 19.5/90 (Higher Score  =  More Pain and/or Debility)    UPPER EXTREMITY ROM     Shoulder to Wrist AROM Right eval Left eval Rt 07/10/22  Forearm supination 63    Forearm  pronation  80    Wrist flexion 37* (tender) (40* PROM)  50* 47  Wrist extension 28* (40* PROM)  68* 36  Wrist ulnar deviation 7*  19  Wrist radial deviation 5*  14  Functional dart thrower's motion (F-DTM) in ulnar flexion 25*    F-DTM in radial extension  27*    (Blank rows = not tested)   Hand AROM Right eval  Full Fist Ability (or Gap to Distal Palmar Crease) Loose fist (fingers barely touch palm  Thumb Opposition to Small Finger (or Gap) 1.5cm gap and slow motion  Thumb Opposition to Base of Small Finger (or Gap)  unable  (Blank rows = not tested)   UPPER EXTREMITY MMT:    Eval:  NT at eval due to concern for ligament tears, however he was stable and non-painful and this will be tested next session to help "tease out" CVA type symptoms as well. Definite weakness Rt vs Lt.   MMT Right 07/03/22  Elbow flexion 5/5 good tone, poor endurance  Elbow extension 4-/5 lower tone  Forearm supination 4+/5 good tone  Forearm pronation 4+/5 good tone  Wrist flexion 4+/5  Wrist/finger extension 3+/5  (Blank rows = not tested)  HAND FUNCTION: 07/03/22: Rt 19# grip strength (no pain); Lt: 118#   Eval: Observed weakness in affected hand.   COORDINATION: 07/03/22: Box & Blocks Test Right 21 Blocks Today (46 is WFL)   Eval: He has observed ataxic motion with finger to nose test, observed slow and poor FMS in Rt hand compared to Lt.  9HPT: Rt 11min 46sec with several pegs dropped.   SENSATION: Eval:  Light touch intact today, he states equal b/l   EDEMA:   07/03/22: Rt hand figure of 8:   48.7cm today  (Lt hand is 48.8cm);  Rt IF base 7.5cm (Lt hand is 7.2cm)   Eval:  Mildly swollen in Rt hand and wrist today  COGNITION: Eval: Overall cognitive status: WFL for evaluation today, though states some memory issues  OBSERVATIONS:   Eval: no facial droop, but Rt sided ataxia present in UE and LE, poor tone and coordination noted as well.  Concern for exacerbation of CVA as main issue vs  ligament tears and orthopedic issues.  (Rt wrist seems stable, negative Watson's Test, no DRUJ instability, negative TFCC shear test)   TODAY'S TREATMENT:  07/12/22: OT starts with review of HEP which he states understanding/doing. OT then directs him to perform supine developmental reflexive patterns, weight bearing neuro activities on plinth. He then does b/l FMS and fnl activities: lacing, small tool use, peg push/pull, and pro/sup flipping cards activity. He has a lot of difficulty with FA supination today.    07/10/22: He reviews and performs stretches for lateral shoulder pain before reviewing PNF neuro pattern D1, AROM for elbow, wrist, hand. OT then supplies hand strengthening putty activities and red t-band for wrist flex, ext strengthening. He tolerates well, no pain  in wrist. New HEP as below. 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  His niece is cuing him and trying to help. OT also applies k-tape for stability to elbow/lateral shoulder as he states this helped him when last done.   Exercises - Full Fist  - 2-3 x daily - 5 reps - Finger Extension "Pizza!"   - 2-3 x daily - 5 reps - Finger Pinch and Pull with Putty  - 2-3 x daily - 5 reps - Wrist Extension with Resistance  - 2-4 x daily - 1-2 sets - 10-15 reps - Wrist Flexion with Resistance  - 2-4 x daily - 1-2 sets - 10-15 reps   PATIENT EDUCATION: Education details: See tx section above for details  Person educated: Patient Education method: Engineer, structural, Teach back, Handouts  Education comprehension: States and demonstrates understanding, Additional Education required    HOME EXERCISE PROGRAM: Access Code: [email or web address] URL: [email or web address]    GOALS: Goals reviewed with patient? Yes   SHORT TERM GOALS: (STG required if POC>30 days) Target Date: 07/13/22  Pt will obtain protective, custom orthotic. Goal status: TBD/PRN for wrist  pain/instability  2.  Pt will demo/state understanding of initial HEP to improve pain levels and prerequisite motion. Goal status: 07/03/22: Progressing    LONG TERM GOALS: Target Date: 08/10/22  Pt will improve functional ability by decreased impairment per PRWE functional assessment from 19.5/40 to 10/40 or better, for better quality of life. Goal status: INITIAL  2.  Pt will improve grip strength in Rt hand from TBDlbs to at least 40lbs for functional use at home and in IADLs. Goal status: INITIAL  3.  Pt will improve A/ROM in Rt wrist flex & ext from 37/28* respectively, to at least 40* both, to have functional motion for tasks like reach and grasp.  Goal status: INITIAL  4.  Pt will improve strength in Rt wrist flex, ext to at least 4+/5 MMT to have increased functional ability to carry out selfcare and higher-level homecare tasks with no difficulty. Goal status: INITIAL  5.  Pt will improve coordination skills in Rt hand, as seen by better score on 9HPT testing to at least 55sec to have increased functional ability to carry out fine motor tasks (fasteners, etc.) and more complex, coordinated IADLs (meal prep, sports, etc.).  Goal status: INITIAL    ASSESSMENT:  CLINICAL IMPRESSION: 07/12/22: He is quite challenged by today's activities and he states it gives him ideas to practice at home. His stroke symptoms actually seem denser since start of care and again he is recommended to see neurologist. Niece states they are seeking "stroke rehab referral," and OT explains that I will d/c this POC so he can see both PT and OT at the stroke center, if he gets those referrals.   07/10/22: He continues to present as mild to moderate R-sided hemiplegia post-stroke. His lethargy and lack of complete follow-through with HEP is detrimental to his recovery. He also hasn't been back to see neurologist yet. His lateral shoulder pain/tightness is a limiting factor as well.    PLAN: OT  FREQUENCY: 2x/week  OT DURATION: 6 weeks  PLANNED INTERVENTIONS: self care/ADL training, therapeutic exercise, therapeutic activity, neuromuscular re-education, manual therapy, passive range of motion, functional mobility training, splinting, electrical stimulation, ultrasound, fluidotherapy, compression bandaging, moist heat, cryotherapy, contrast bath, patient/family education, and coping strategies training  RECOMMENDED OTHER SERVICES: is getting PT for shoulder tears right now.  PT was recommended to assess and tx for LE weakness/stroke signs as well. He was also recommended to see cardiologist/neurologist about possible stroke evolution.   CONSULTED AND AGREED WITH PLAN OF CARE: Patient and family member/caregiver  PLAN FOR NEXT SESSION:  Continue Fnl activities, work toward prone scap exercises. Consider D/C early if he get more appropriate referral to neuro rehab clinic for OT and PT there (this was recommended to them).    Benito Mccreedy, OTR/L, CHT 07/12/2022, 12:27 PM

## 2022-07-12 ENCOUNTER — Other Ambulatory Visit: Payer: Self-pay

## 2022-07-12 ENCOUNTER — Encounter: Payer: Self-pay | Admitting: Rehabilitative and Restorative Service Providers"

## 2022-07-12 ENCOUNTER — Ambulatory Visit: Payer: Medicare HMO | Attending: Cardiovascular Disease | Admitting: Cardiovascular Disease

## 2022-07-12 ENCOUNTER — Ambulatory Visit: Payer: Medicare HMO | Admitting: Physical Therapy

## 2022-07-12 ENCOUNTER — Encounter: Payer: Self-pay | Admitting: Cardiovascular Disease

## 2022-07-12 ENCOUNTER — Ambulatory Visit: Payer: Medicare HMO | Admitting: Rehabilitative and Restorative Service Providers"

## 2022-07-12 ENCOUNTER — Encounter: Payer: Self-pay | Admitting: Physical Therapy

## 2022-07-12 VITALS — BP 144/70 | HR 71 | Ht 69.0 in | Wt 174.8 lb

## 2022-07-12 DIAGNOSIS — M6281 Muscle weakness (generalized): Secondary | ICD-10-CM | POA: Diagnosis not present

## 2022-07-12 DIAGNOSIS — I10 Essential (primary) hypertension: Secondary | ICD-10-CM | POA: Diagnosis not present

## 2022-07-12 DIAGNOSIS — I63512 Cerebral infarction due to unspecified occlusion or stenosis of left middle cerebral artery: Secondary | ICD-10-CM

## 2022-07-12 DIAGNOSIS — M25631 Stiffness of right wrist, not elsewhere classified: Secondary | ICD-10-CM

## 2022-07-12 DIAGNOSIS — I5020 Unspecified systolic (congestive) heart failure: Secondary | ICD-10-CM

## 2022-07-12 DIAGNOSIS — G8929 Other chronic pain: Secondary | ICD-10-CM

## 2022-07-12 DIAGNOSIS — R072 Precordial pain: Secondary | ICD-10-CM

## 2022-07-12 DIAGNOSIS — M25511 Pain in right shoulder: Secondary | ICD-10-CM | POA: Diagnosis not present

## 2022-07-12 DIAGNOSIS — R278 Other lack of coordination: Secondary | ICD-10-CM

## 2022-07-12 DIAGNOSIS — R600 Localized edema: Secondary | ICD-10-CM

## 2022-07-12 DIAGNOSIS — I69359 Hemiplegia and hemiparesis following cerebral infarction affecting unspecified side: Secondary | ICD-10-CM | POA: Diagnosis not present

## 2022-07-12 NOTE — Addendum Note (Signed)
Addended by: Brandon Melnick on: 07/12/2022 02:16 PM   Modules accepted: Orders

## 2022-07-12 NOTE — Patient Instructions (Addendum)
Medication Instructions:  Your physician recommends that you continue on your current medications as directed. Please refer to the Current Medication list given to you today.  *If you need a refill on your cardiac medications before your next appointment, please call your pharmacy*  Lab Work: If you have labs (blood work) drawn today and your tests are completely normal, you will receive your results only by: [place] (if you have MyChart) OR A paper copy in the mail If you have any lab test that is abnormal or we need to change your treatment, we will call you to review the results.  Testing/Procedures: Your physician has requested that you have cardiac PET CT.   Follow-Up: At [facility], you and your health needs are our priority.  As part of our continuing mission to provide you with exceptional heart care, we have created designated Provider Care Teams.  These Care Teams include your primary Cardiologist (physician) and Advanced Practice Providers (APPs -  Physician Assistants and Nurse Practitioners) who all work together to provide you with the care you need, when you need it.  We recommend signing up for the patient portal called "MyChart".  Sign up information is provided on this After Visit Summary.  MyChart is used to connect with patients for Virtual Visits (Telemedicine).  Patients are able to view lab/test results, encounter notes, upcoming appointments, etc.  Non-urgent messages can be sent to your provider as well.   To learn more about what you can do with MyChart, go to [email or web address].    Your next appointment:   12 month(s)  The format for your next appointment:   In Person  Provider:   Jenkins Rouge, MD     How to Prepare for Your Cardiac PET/CT Stress Test:  1. Please do not take these medications before your test:   Carvedilol Medications that may interfere with the cardiac pharmacological stress agent (ex. nitrates - including erectile  dysfunction medications or beta-blockers) the day of the exam. (Erectile dysfunction medication should be held for at least 72 hrs prior to test) Theophylline containing medications for 12 hours. Dipyridamole 48 hours prior to the test. Your remaining medications may be taken with water.  2. Nothing to eat or drink, except water, 3 hours prior to arrival time.   NO caffeine/decaffeinated products, or chocolate 12 hours prior to arrival.  3. NO perfume, cologne or lotion  4. Total time is 1 to 2 hours; you may want to bring reading material for the waiting time.  5. Please report to Admitting at the [place] Entrance 60 minutes early for your test.  [place], [place] [address]  IF YOU THINK YOU MAY BE PREGNANT, OR ARE NURSING PLEASE INFORM THE TECHNOLOGIST.  In preparation for your appointment, medication and supplies will be purchased.  Appointment availability is limited, so if you need to cancel or reschedule, please call the Radiology Department at [phone]  24 hours in advance to avoid a cancellation fee of $100.00  What to Expect After you Arrive:  Once you arrive and check in for your appointment, you will be taken to a preparation room within the Radiology Department.  A technologist or Nurse will obtain your medical history, verify that you are correctly prepped for the exam, and explain the procedure.  Afterwards,  an IV will be started in your arm and electrodes will be placed on your skin for EKG monitoring during the stress portion of the exam.  Then you will be escorted to the PET/CT scanner.  There, staff will get you positioned on the scanner and obtain a blood pressure and EKG.  During the exam, you will continue to be connected to the EKG and blood pressure machines.  A small, safe amount of a radioactive tracer will be injected in your IV to obtain a series of pictures of your heart along with an injection of a stress agent.    After your  Exam:  It is recommended that you eat a meal and drink a caffeinated beverage to counter act any effects of the stress agent.  Drink plenty of fluids for the remainder of the day and urinate frequently for the first couple of hours after the exam.  Your doctor will inform you of your test results within 7-10 business days.  For questions about your test or how to prepare for your test, please call: Rockwell [place], Cardiac Imaging Nurse Navigator  Larey Brick, Cardiac Imaging Nurse Navigator Office: [phone]   Important Information About Sugar

## 2022-07-12 NOTE — Therapy (Signed)
OUTPATIENT PHYSICAL THERAPY TREATMENT NOTE   Patient Name: Joseph Patrick MRN: [id number] DOB:18-Mar-1946, 76 y.o., male Today's Date: 07/12/2022  PCP: Arthur Holms, NP   REFERRING PROVIDER: Aundra Dubin, PA-C    END OF SESSION:   PT End of Session - 07/12/22 0956     Visit Number 8    Number of Visits 13    Date for PT Re-Evaluation 07/31/22    Authorization Type [employer] MCR    Authorization Time Period 06/19/22-07/20/22    Authorization - Visit Number 8    Authorization - Number of Visits 10    Progress Note Due on Visit 10    PT Start Time 1000    PT Stop Time 1040    PT Time Calculation (min) 40 min    Activity Tolerance Patient tolerated treatment well    Behavior During Therapy [facility] for tasks assessed/performed                Past Medical History:  Diagnosis Date   CVA (cerebral vascular accident) [facility]    april 2023   Diabetes mellitus without complication ([place])    Hypertension    History reviewed. No pertinent surgical history. 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 ([place]) 01/14/2022   HFrEF (heart failure with reduced ejection fraction) ([place])    Essential hypertension 01/07/2022   Hyperlipidemia 01/07/2022   Diabetes ([place]) 01/07/2022   Stage 3a chronic kidney disease (CKD) ([place]) 01/07/2022   Transient cerebral ischemia 01/06/2022    REFERRING DIAG: Chronic right shoulder pain   THERAPY DIAG:  Chronic right shoulder pain  Muscle weakness (generalized)  Rationale for Evaluation and Treatment Rehabilitation  PERTINENT HISTORY: CVA April 2023 resulting in right sided weakness   PRECAUTIONS: Fall    SUBJECTIVE: Patient reports he is having a lot soreness in the mornings and currently feels tight.   PAIN:  Are you having pain? Yes:  NPRS scale: 4/10 Pain location: Right shoulder, outer upper arm Pain  description: Dull, achy Aggravating factors: Moving the shoulder, occasionally laying on right side Relieving factors: Rest   OBJECTIVE: (objective measures completed at initial evaluation unless otherwise dated) DIAGNOSTIC FINDINGS:  IMPRESSION: Complete supraspinatus and near complete subscapularis tendon tears with retraction to the glenohumeral joint. No fatty replacement is present but there is edema in the supraspinatus and infraspinatus muscle bellies compatible early atrophy. Severe tendinopathy of the intra-articular long head of biceps and infraspinatus. Bulky acromioclavicular osteoarthritis and subacromial spurring. Moderate glenohumeral osteoarthritis. Debris containing fluid in the subacromial/subdeltoid bursa.   POSTURE: Slight rounded shoulders   UPPER EXTREMITY ROM:    Active ROM Right eval Left eval Right  06/26/22 Right 07/03/22 Right 07/10/2022  Shoulder flexion 90 - 100 supine 95 AAROM with pulleys PROM 110 end range pain 90  Shoulder extension 30 -     Shoulder abduction 80 -     Shoulder internal rotation Unable to reach behind back -     Shoulder external rotation Unable to reach behind head -                  Patient also demonstrates right elbow extension limitation   UPPER EXTREMITY MMT: Strength not formally assessed for right shoulder, he exhibits significant strength deficit of the right shoulder with inability to hold against resistance with shoulder external rotation or elevation due to weakness   JOINT  MOBILITY TESTING:  Hypomoblity of the right GHJ   PALPATION:  Non tender to palpation right shoulder               TODAY'S TREATMENT:  [facility] Adult PT Treatment:                                                DATE: 07/12/22 Therapeutic Exercise: UBE L1 x 4 min (2 fwd/bwd) while taking subjective Sidelying shoulder abduction with elbow bent 2 x 15 Sidelying ER 2 x 10 Supine dowel shoulder press with 1# x 10, 2# x 10 Supine shoulder press and  overhead reach with 1# x 10, 2# x 10 Seated overhead pulley x 2 min Seated shoulder flexion physioball roll out 10 x 5 sec Standing row with green 2 x 20 Standing extension with red 2 x 20 Standing bicep curl with 2# in right 2 x 20 Standing tricep extension with red 2 x 20 Manual Therapy: Right GHJ and scapulothoracic mobs in all directions Right shoulder PROM in all directions   [facility] Adult PT Treatment:                                                DATE: 07/10/22 Therapeutic Exercise: UBE L1 x 4 min (2 fwd/bwd) while taking subjective Seated overhead pulley x 2 min Sidelying shoulder abduction with elbow bent 2 x 15 Sidelying ER 2 x 10 Supine dowel shoulder press x 15, with 1# x 10 Supine shoulder press with 1# and overhead reach 2 x 10 Standing row with green 2 x 20 Standing extension with red 2 x 20 Manual Therapy: Right GHJ and scapulothoracic mobs  Right shoulder PROM  [facility] Adult PT Treatment:                                                DATE: 07/05/22 Therapeutic Exercise: UBE L1 x 4 min (2 fwd/bwd) while taking subjective Supine dowel shoulder press x 10 Supine shoulder press with 1# x 5 Sidelying shoulder abduction with elbow bent 2 x 10 Sidelying ER 2 x 10 Seated overhead pulley x 2 min Seated single arm row with red 2 x 20 on right Manual Therapy: Right GHJ mobs  Right shoulder PROM   PATIENT EDUCATION: Education details: HEP Person educated: Patient Education method: Consulting civil engineer, Demonstration, Corporate treasurer cues, Verbal cues Education comprehension: verbalized understanding, returned demonstration, verbal cues required, tactile cues required, and needs further education   HOME EXERCISE PROGRAM: Access Code: [email or web address]     ASSESSMENT: CLINICAL IMPRESSION: Patient tolerated therapy well with no adverse effects. Patient seems to reporting increased soreness and tightness of the right shoulder and continues to have a significant amount of crepitus of the right  shoulder with movement. He does exhibit improved active elevation of the shoulder following therapy compared to pre therapy this visit, but overall continues to have significant strength deficits of the right shoulder and UE that seem to be a combination of rotator cuff injury and prior stroke. Therapy continues to focus on progressing shoulder mobility and strength, and patient does report pain with  shoulder elevation stretching. No changes to HEP this visit. Patient would benefit from continued skilled PT to progress his mobility and strength in order to reduce pain and maximize functional ability.      OBJECTIVE IMPAIRMENTS decreased activity tolerance, decreased ROM, decreased strength, postural dysfunction, and pain.    ACTIVITY LIMITATIONS carrying, lifting, dressing, reach over head, and hygiene/grooming   PARTICIPATION LIMITATIONS: meal prep, cleaning, shopping, community activity, and yard work   [place], Past/current experiences, Time since onset of injury/illness/exacerbation, and 3+ comorbidities: see above  are also affecting patient's functional outcome.      GOALS: Goals reviewed with patient? Yes   SHORT TERM GOALS: Target date: 07/10/2022    Patient will be I with initial HEP in order to progress with therapy. Baseline: HEP provided at eval Goal status: MET   2.  Patient will report 50% improvement in pain at night in order to improve sleeping ability Baseline: patient reports pain will wake him on a nightly basis 07/10/2022: patient continues to report nocturnal pain that disrupts sleep Goal status: ONGOING   LONG TERM GOALS: Target date: 07/31/2022   Patient will be I with final HEP to maintain progress from PT. Baseline: HEP provided at eval Goal status: INITIAL   2.  Patient will demonstrate right shoulder active elevation >/= 120 deg in order to improve functional use of right arm with overhead tasks and self care  Baseline: 90 deg shoulder  flexion Goal status: INITIAL   3.  Patient will report </= 1/10 pain level with all activities using the right arm to reduce functional limitations Baseline: 2/10 pain Goal status: INITIAL   4.  Patient will report no more than minimal limitation with performing household activities, dressing, and all self care tasks in order to indicate improved functional ability Baseline: patient reports inability to perform some household and seld care tasks Goal status: INITIAL     PLAN: PT FREQUENCY: 1-2x/week   PT DURATION: 6 weeks   PLANNED INTERVENTIONS: Therapeutic exercises, Therapeutic activity, Neuromuscular re-education, Balance training, Gait training, Patient/Family education, Self Care, Joint mobilization, Joint manipulation, Aquatic Therapy, Dry Needling, Electrical stimulation, Cryotherapy, Moist heat, Taping, Manual therapy, and Re-evaluation   PLAN FOR NEXT SESSION: Review HEP and progress PRN, right shoulder mobs/PROM, progress AAROM to AROM as tolerated, progress shoulder and parascapular strengthening as tolerated       Hilda Blades, PT, DPT, LAT, ATC 07/12/22  10:41 AM Phone: [phone] Fax: [phone]

## 2022-07-13 ENCOUNTER — Other Ambulatory Visit: Payer: Self-pay

## 2022-07-13 DIAGNOSIS — R079 Chest pain, unspecified: Secondary | ICD-10-CM

## 2022-07-17 ENCOUNTER — Encounter: Payer: Self-pay | Admitting: Physical Therapy

## 2022-07-17 ENCOUNTER — Ambulatory Visit: Payer: Medicare HMO | Admitting: Physical Therapy

## 2022-07-17 ENCOUNTER — Other Ambulatory Visit: Payer: Self-pay

## 2022-07-17 ENCOUNTER — Encounter: Payer: Self-pay | Admitting: Rehabilitative and Restorative Service Providers"

## 2022-07-17 ENCOUNTER — Ambulatory Visit: Payer: Medicare HMO | Admitting: Rehabilitative and Restorative Service Providers"

## 2022-07-17 DIAGNOSIS — M25631 Stiffness of right wrist, not elsewhere classified: Secondary | ICD-10-CM | POA: Diagnosis not present

## 2022-07-17 DIAGNOSIS — R600 Localized edema: Secondary | ICD-10-CM

## 2022-07-17 DIAGNOSIS — M6281 Muscle weakness (generalized): Secondary | ICD-10-CM

## 2022-07-17 DIAGNOSIS — M25511 Pain in right shoulder: Secondary | ICD-10-CM | POA: Diagnosis not present

## 2022-07-17 DIAGNOSIS — I63512 Cerebral infarction due to unspecified occlusion or stenosis of left middle cerebral artery: Secondary | ICD-10-CM

## 2022-07-17 DIAGNOSIS — R278 Other lack of coordination: Secondary | ICD-10-CM | POA: Diagnosis not present

## 2022-07-17 DIAGNOSIS — G8929 Other chronic pain: Secondary | ICD-10-CM

## 2022-07-17 NOTE — Therapy (Signed)
OUTPATIENT OCCUPATIONAL THERAPY TREATMENT NOTE  Patient Name: Joseph Patrick MRN: [id number] DOB:10-18-1945, 76 y.o., male Today's Date: 07/17/2022  PCP: Arthur Holms, NP REFERRING PROVIDER: Leandrew Koyanagi, MD   OT End of Session - 07/17/22 1103     Visit Number 6    Number of Visits 12    Date for OT Re-Evaluation 08/10/22    Authorization Type [employer]    Progress Note Due on Visit 10    OT Start Time 1104    OT Stop Time 1144    OT Time Calculation (min) 40 min    Activity Tolerance Patient tolerated treatment well;Patient limited by fatigue;Patient limited by pain    Behavior During Therapy [facility] for tasks assessed/performed             Past Medical History:  Diagnosis Date   CVA (cerebral vascular accident) [facility])    april 2023   Diabetes mellitus without complication ([place])    Hypertension    History reviewed. No pertinent surgical history. 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 ([place]) 01/14/2022   HFrEF (heart failure with reduced ejection fraction) ([place])    Essential hypertension 01/07/2022   Hyperlipidemia 01/07/2022   Diabetes ([place]) 01/07/2022   Stage 3a chronic kidney disease (CKD) ([place]) 01/07/2022   Transient cerebral ischemia 01/06/2022    ONSET DATE:  July 16th, 2023 (about 12 weeks since injury)   REFERRING DIAG: M25.531 (ICD-10-CM) - Pain in right wrist  THERAPY DIAG:  Muscle weakness (generalized)  Stiffness of right wrist, not elsewhere classified  Other lack of coordination  Acute ischemic left MCA stroke (HCC)  Localized edema  Rationale for Evaluation and Treatment Rehabilitation  PERTINENT HISTORY: Per medical notes, he fell ~ 2 months ago, tearing (complete supraspinatus) Rt RTC, tore Rt TFCC as well as S-L ligament.  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 He states falling ~2 months ago and wrist bend into flexion and he tore ligaments in his wrist. He states his right hand is now swollen and stiff. He also states having a stroke this April (about 6 months ago) that affected Rt side, but he states rehabbing back to "normal" before fall. He states no significant pain at fall and none now- just swollen, tight, stiff and also poorer coordination now. He states not wearing any wrist brace after fall, tried a sling for his shoulder, which he felt "made him worse," so d/c'd it.   His niece is with him today to support him."  PRECAUTIONS: Fall and Other: Rt RTC tears   WEIGHT BEARING RESTRICTIONS Yes caution for Rt Shoulder and wrist   SUBJECTIVE:   SUBJECTIVE STATEMENT: He arrives with niece. States no pain, has been practicing "rocking and rolling" at home. Has PT eval at neuro on Friday.    PAIN:  Are you having pain?  No Rating: 0/10 at rest, up to 0/10 in past week at worst and states not actively avoiding use thereof  FALLS: Has patient fallen in last 6 months? Yes. Number of falls 1 - this injury  LIVING ENVIRONMENT: Lives with: Alone. He states driving since stroke. He uses a cane for stability.    OBJECTIVE: (All objective assessments below are from initial evaluation on: 06/28/22 unless otherwise specified.)    HAND DOMINANCE: Right   ADLs: Overall ADLs:  He states problems with dressing, bathing, turning door handles, and FMS buttoning with Rt hand now.   FUNCTIONAL OUTCOME MEASURES: Eval: Patient Rated Wrist Evaluation (PRWE) (modified to exclude work and recreational activities as he states not doing at baseline): Pain: 0/50; Function: 19.5/40; Total Score: 19.5/90 (Higher Score  =  More Pain and/or Debility)    UPPER EXTREMITY ROM     Shoulder to Wrist AROM Right eval Left eval Rt 07/10/22  Forearm supination 63    Forearm pronation  80    Wrist flexion 37* (tender) (40* PROM)  50* 47  Wrist extension 28*  (40* PROM)  68* 36  Wrist ulnar deviation 7*  19  Wrist radial deviation 5*  14  Functional dart thrower's motion (F-DTM) in ulnar flexion 25*    F-DTM in radial extension  27*    (Blank rows = not tested)   Hand AROM Right eval  Full Fist Ability (or Gap to Distal Palmar Crease) Loose fist (fingers barely touch palm  Thumb Opposition to Small Finger (or Gap) 1.5cm gap and slow motion  Thumb Opposition to Base of Small Finger (or Gap)  unable  (Blank rows = not tested)   UPPER EXTREMITY MMT:    Eval:  NT at eval due to concern for ligament tears, however he was stable and non-painful and this will be tested next session to help "tease out" CVA type symptoms as well. Definite weakness Rt vs Lt.   MMT Right 07/03/22  Elbow flexion 5/5 good tone, poor endurance  Elbow extension 4-/5 lower tone  Forearm supination 4+/5 good tone  Forearm pronation 4+/5 good tone  Wrist flexion 4+/5  Wrist/finger extension 3+/5  (Blank rows = not tested)  HAND FUNCTION: 07/03/22: Rt 19# grip strength (no pain); Lt: 118#   Eval: Observed weakness in affected hand.   COORDINATION: 07/03/22: Box & Blocks Test Right 21 Blocks Today (46 is WFL)   Eval: He has observed ataxic motion with finger to nose test, observed slow and poor FMS in Rt hand compared to Lt.  9HPT: Rt 46sec with several pegs dropped.   SENSATION: Eval:  Light touch intact today, he states equal b/l   EDEMA:   07/03/22: Rt hand figure of 8:   48.7cm today  (Lt hand is 48.8cm);  Rt IF base 7.5cm (Lt hand is 7.2cm)   Eval:  Mildly swollen in Rt hand and wrist today  COGNITION: Eval: Overall cognitive status: WFL for evaluation today, though states some memory issues  OBSERVATIONS:   Eval: no facial droop, but Rt sided ataxia present in UE and LE, poor tone and coordination noted as well.  Concern for exacerbation of CVA as main issue vs ligament tears and orthopedic issues.  (Rt wrist seems stable, negative Watson's Test,  no DRUJ instability, negative TFCC shear test)   TODAY'S TREATMENT:  07/17/22: He reviews/performs developmental postures on plinth with ful body flexion, rolling supine to side lying and new to prone and doing weight bearing techniques prone on elbows with shoulder bade strengthening in protraction.  He then sits up does AAROM with cane at shoulder followed by 4# weighted b/l biceps and wrist flex/ext using his cane. Last he throws and catches a soft, large ball 20x with cues to fully extend Rt arm as able. He states mildly painful but tolerable.    07/12/22: OT starts with review of HEP which he states understanding/doing. OT then directs him to perform supine developmental reflexive patterns, weight bearing  neuro activities on plinth. He then does b/l FMS and fnl activities: lacing, small tool use, peg push/pull, and pro/sup flipping cards activity. He has a lot of difficulty with FA supination today.     PATIENT EDUCATION: Education details: See tx section above for details  Person educated: Patient Education method: Verbal Instruction, Teach back, Handouts  Education comprehension: States and demonstrates understanding, Additional Education required    HOME EXERCISE PROGRAM: Access Code: [email or web address] URL: [email or web address]    GOALS: Goals reviewed with patient? Yes   SHORT TERM GOALS: (STG required if POC>30 days) Target Date: 07/13/22  Pt will obtain protective, custom orthotic. Goal status: TBD/PRN for wrist pain/instability  2.  Pt will demo/state understanding of initial HEP to improve pain levels and prerequisite motion. Goal status: 07/03/22: Progressing    LONG TERM GOALS: Target Date: 08/10/22  Pt will improve functional ability by decreased impairment per PRWE functional assessment from 19.5/40 to 10/40 or better, for better quality of life. Goal status: INITIAL  2.  Pt will improve grip strength in Rt hand from TBDlbs to at least 40lbs for  functional use at home and in IADLs. Goal status: INITIAL  3.  Pt will improve A/ROM in Rt wrist flex & ext from 37/28* respectively, to at least 40* both, to have functional motion for tasks like reach and grasp.  Goal status: INITIAL  4.  Pt will improve strength in Rt wrist flex, ext to at least 4+/5 MMT to have increased functional ability to carry out selfcare and higher-level homecare tasks with no difficulty. Goal status: INITIAL  5.  Pt will improve coordination skills in Rt hand, as seen by better score on 9HPT testing to at least 55sec to have increased functional ability to carry out fine motor tasks (fasteners, etc.) and more complex, coordinated IADLs (meal prep, sports, etc.).  Goal status: INITIAL    ASSESSMENT:  CLINICAL IMPRESSION: 07/17/22: He has less pain, more appetite recently. He has PT CVA eval this Friday and OT is working to transition him to OT at neuro center as well, for his convenience   07/12/22: He is quite challenged by today's activities and he states it gives him ideas to practice at home. His stroke symptoms actually seem denser since start of care and again he is recommended to see neurologist. Niece states they are seeking "stroke rehab referral," and OT explains that I will d/c this POC so he can see both PT and OT at the stroke center, if he gets those referrals.   07/10/22: He continues to present as mild to moderate R-sided hemiplegia post-stroke. His lethargy and lack of complete follow-through with HEP is detrimental to his recovery. He also hasn't been back to see neurologist yet. His lateral shoulder pain/tightness is a limiting factor as well.    PLAN: OT FREQUENCY: 2x/week  OT DURATION: 6 weeks  PLANNED INTERVENTIONS: self care/ADL training, therapeutic exercise, therapeutic activity, neuromuscular re-education, manual therapy, passive range of motion, functional mobility training, splinting, electrical stimulation, ultrasound,  fluidotherapy, compression bandaging, moist heat, cryotherapy, contrast bath, patient/family education, and coping strategies training  RECOMMENDED OTHER SERVICES: is getting PT for shoulder tears right now. PT was recommended to assess and tx for LE weakness/stroke signs as well. He was also recommended to see cardiologist/neurologist about possible stroke evolution.   CONSULTED AND AGREED WITH PLAN OF CARE: Patient and family member/caregiver  PLAN FOR NEXT SESSION:  Continue with weight bearing, fnl and neuro re-ed techniques.  Consider D/C early if  he get more appropriate referral to neuro rehab clinic for OT and PT there (this was recommended to them).    Fannie Knee, OTR/L, CHT 07/17/2022, 1:08 PM

## 2022-07-17 NOTE — Therapy (Signed)
OUTPATIENT PHYSICAL THERAPY TREATMENT NOTE   Patient Name: Joseph Patrick MRN: [id number] DOB:03-23-1946, 76 y.o., male Today's Date: 07/17/2022  PCP: Arthur Holms, NP   REFERRING PROVIDER: Aundra Dubin, PA-C    END OF SESSION:   PT End of Session - 07/17/22 0958     Visit Number 9    Number of Visits 13    Date for PT Re-Evaluation 07/31/22    Authorization Type [employer] MCR    Authorization Time Period 06/19/22-07/20/22    Authorization - Visit Number 9    Authorization - Number of Visits 10    Progress Note Due on Visit 10    PT Start Time 1000    PT Stop Time 1040    PT Time Calculation (min) 40 min    Activity Tolerance Patient tolerated treatment well    Behavior During Therapy [facility] for tasks assessed/performed                 Past Medical History:  Diagnosis Date   CVA (cerebral vascular accident) [facility])    april 2023   Diabetes mellitus without complication ([place])    Hypertension    History reviewed. No pertinent surgical history. 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 ([place]) 01/14/2022   HFrEF (heart failure with reduced ejection fraction) ([place])    Essential hypertension 01/07/2022   Hyperlipidemia 01/07/2022   Diabetes ([place]) 01/07/2022   Stage 3a chronic kidney disease (CKD) ([place]) 01/07/2022   Transient cerebral ischemia 01/06/2022    REFERRING DIAG: Chronic right shoulder pain   THERAPY DIAG:  Chronic right shoulder pain  Muscle weakness (generalized)  Rationale for Evaluation and Treatment Rehabilitation  PERTINENT HISTORY: CVA April 2023 resulting in right sided weakness   PRECAUTIONS: Fall    SUBJECTIVE: Patient reports nothing has changed. He is still having pain on the outside of his upper arm, stating "the pain has settle right in the muscle."  PAIN:  Are you having pain? Yes:  NPRS scale:  4/10 Pain location: Right shoulder, outer upper arm Pain description: Dull, achy Aggravating factors: Moving the shoulder, occasionally laying on right side Relieving factors: Rest   OBJECTIVE: (objective measures completed at initial evaluation unless otherwise dated) DIAGNOSTIC FINDINGS:  IMPRESSION: Complete supraspinatus and near complete subscapularis tendon tears with retraction to the glenohumeral joint. No fatty replacement is present but there is edema in the supraspinatus and infraspinatus muscle bellies compatible early atrophy. Severe tendinopathy of the intra-articular long head of biceps and infraspinatus. Bulky acromioclavicular osteoarthritis and subacromial spurring. Moderate glenohumeral osteoarthritis. Debris containing fluid in the subacromial/subdeltoid bursa.   POSTURE: Slight rounded shoulders   UPPER EXTREMITY ROM:    Active ROM Right eval Right  06/26/22 Right 07/03/22 Right 07/10/2022 Right 07/17/2022  Shoulder flexion 90 100 supine 95 AAROM with pulleys PROM 110 end range pain 90 90 - compensated scaption with shrug  Shoulder extension 30      Shoulder abduction 80      Shoulder internal rotation Unable to reach behind back      Shoulder external rotation Unable to reach behind head    Unable to reach behind head               Patient also demonstrates right elbow extension limitation   UPPER EXTREMITY MMT: Strength not formally assessed for right shoulder, he exhibits significant strength deficit of the  right shoulder with inability to hold against resistance with shoulder external rotation or elevation due to weakness   JOINT MOBILITY TESTING:  Hypomoblity of the right GHJ   PALPATION:  Non tender to palpation right shoulder               TODAY'S TREATMENT:  [facility] Adult PT Treatment:                                                DATE: 07/17/22 Therapeutic Exercise: UBE L1 x 4 min (2 fwd/bwd) while taking subjective Sidelying shoulder  abduction with elbow bent 2 x 15 Sidelying ER 2 x 15 Supine dowel shoulder press with 2# 2 x 15 Seated overhead pulley for shoulder elevation x 2 min Standing shoulder isometrics flexion, abduction, extension 5 x 5 sec each Standing row with green 2 x 20 Standing extension with red 2 x 20 Standing bicep curl with 2# in right 2 x 20 Standing tricep extension with green 2 x 20 Manual Therapy: Right GHJ and scapulothoracic mobs in all directions Right shoulder PROM in all directions   [facility] Adult PT Treatment:                                                DATE: 07/12/22 Therapeutic Exercise: UBE L1 x 4 min (2 fwd/bwd) while taking subjective Sidelying shoulder abduction with elbow bent 2 x 15 Sidelying ER 2 x 10 Supine dowel shoulder press with 1# x 10, 2# x 10 Supine shoulder press and overhead reach with 1# x 10, 2# x 10 Seated overhead pulley x 2 min Seated shoulder flexion physioball roll out 10 x 5 sec Standing row with green 2 x 20 Standing extension with red 2 x 20 Standing bicep curl with 2# in right 2 x 20 Standing tricep extension with red 2 x 20 Manual Therapy: Right GHJ and scapulothoracic mobs in all directions Right shoulder PROM in all directions  [facility] Adult PT Treatment:                                                DATE: 07/10/22 Therapeutic Exercise: UBE L1 x 4 min (2 fwd/bwd) while taking subjective Seated overhead pulley x 2 min Sidelying shoulder abduction with elbow bent 2 x 15 Sidelying ER 2 x 10 Supine dowel shoulder press x 15, with 1# x 10 Supine shoulder press with 1# and overhead reach 2 x 10 Standing row with green 2 x 20 Standing extension with red 2 x 20 Manual Therapy: Right GHJ and scapulothoracic mobs  Right shoulder PROM   PATIENT EDUCATION: Education details: HEP, follow-up with referring provided due to lack of progress Person educated: Patient Education method: Explanation, Demonstration, Tactile cues, Verbal cues Education  comprehension: verbalized understanding, returned demonstration, verbal cues required, tactile cues required, and needs further education   HOME EXERCISE PROGRAM: Access Code: [email or web address]     ASSESSMENT: CLINICAL IMPRESSION: Patient tolerated therapy well with no adverse effects. Therapy continues to focus on progress right shoulder mobility and strength as tolerated. He continues to demonstrate significant limitations with  right shoulder active motion and strength, that has not improved since evaluation. He does report within session improvements in his pain and shoulder function, and improved ability to perform golf swing movement since the evaluation. Patient was encouraged to continue with his shoulder stretching and strengthening at home, and he will begin PT at the neuro clinic for LE strengthening secondary to previous stroke. He was instructed to follow-up with referring provider due to lack of progress since evaluation with his shoulder mobility and strength, and to return to PT for shoulder as needed. Patient would benefit from continued skilled PT to progress his mobility and strength in order to reduce pain and maximize functional ability.      OBJECTIVE IMPAIRMENTS decreased activity tolerance, decreased ROM, decreased strength, postural dysfunction, and pain.    ACTIVITY LIMITATIONS carrying, lifting, dressing, reach over head, and hygiene/grooming   PARTICIPATION LIMITATIONS: meal prep, cleaning, shopping, community activity, and yard work   [place], Past/current experiences, Time since onset of injury/illness/exacerbation, and 3+ comorbidities: see above  are also affecting patient's functional outcome.      GOALS: Goals reviewed with patient? Yes   SHORT TERM GOALS: Target date: 07/10/2022    Patient will be I with initial HEP in order to progress with therapy. Baseline: HEP provided at eval Goal status: MET   2.  Patient will report 50% improvement in pain  at night in order to improve sleeping ability Baseline: patient reports pain will wake him on a nightly basis 07/10/2022: patient continues to report nocturnal pain that disrupts sleep 07/17/2022: patient continues to report nocturnal pain that disrupts sleep Goal status: ONGOING   LONG TERM GOALS: Target date: 07/31/2022   Patient will be I with final HEP to maintain progress from PT. Baseline: HEP provided at eval 07/17/2022: progressing Goal status: ONGOING   2.  Patient will demonstrate right shoulder active elevation >/= 120 deg in order to improve functional use of right arm with overhead tasks and self care  Baseline: 90 deg shoulder flexion 07/17/2022: 90 deg Goal status: ONGOING   3.  Patient will report </= 1/10 pain level with all activities using the right arm to reduce functional limitations Baseline: 2/10 pain 07/17/2022: 4/10 Goal status: ONGOING   4.  Patient will report no more than minimal limitation with performing household activities, dressing, and all self care tasks in order to indicate improved functional ability Baseline: patient reports inability to perform some household and seld care tasks 07/17/2022: patient continues to report limitation performing household and self care tasks Goal status: ONGOING     PLAN: PT FREQUENCY: 1-2x/week   PT DURATION: 6 weeks   PLANNED INTERVENTIONS: Therapeutic exercises, Therapeutic activity, Neuromuscular re-education, Balance training, Gait training, Patient/Family education, Self Care, Joint mobilization, Joint manipulation, Aquatic Therapy, Dry Needling, Electrical stimulation, Cryotherapy, Moist heat, Taping, Manual therapy, and Re-evaluation   PLAN FOR NEXT SESSION: Review HEP and progress PRN, right shoulder mobs/PROM, progress AAROM to AROM as tolerated, progress shoulder and parascapular strengthening as tolerated       Hilda Blades, PT, DPT, LAT, ATC 07/17/22  12:39 PM Phone: [phone] Fax:  [phone]

## 2022-07-18 NOTE — Therapy (Signed)
OUTPATIENT OCCUPATIONAL THERAPY TREATMENT NOTE  Patient Name: Joseph Patrick MRN: [id number] DOB:1946/04/13, 76 y.o., male Today's Date: 07/19/2022  PCP: Arthur Holms, NP REFERRING PROVIDER: Leandrew Koyanagi, MD   OT End of Session - 07/19/22 [phone]     Visit Number 7    Number of Visits 12    Date for OT Re-Evaluation 08/10/22    Authorization Type [employer]    Progress Note Due on Visit 10    OT Start Time [phone]    OT Stop Time 1018    OT Time Calculation (min) 36 min    Activity Tolerance Patient tolerated treatment well;Patient limited by fatigue;Patient limited by pain    Behavior During Therapy [facility] for tasks assessed/performed              Past Medical History:  Diagnosis Date   CVA (cerebral vascular accident) [facility])    april 2023   Diabetes mellitus without complication ([place])    Hypertension    History reviewed. No pertinent surgical history. 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 ([place]) 01/14/2022   HFrEF (heart failure with reduced ejection fraction) ([place])    Essential hypertension 01/07/2022   Hyperlipidemia 01/07/2022   Diabetes ([place]) 01/07/2022   Stage 3a chronic kidney disease (CKD) ([place]) 01/07/2022   Transient cerebral ischemia 01/06/2022    ONSET DATE:  July 16th, 2023 (about 12 weeks since injury)   REFERRING DIAG: M25.531 (ICD-10-CM) - Pain in right wrist  THERAPY DIAG:  Muscle weakness (generalized)  Stiffness of right wrist, not elsewhere classified  Other lack of coordination  Localized edema  Acute ischemic left MCA stroke (HCC)  Chronic right shoulder pain  Rationale for Evaluation and Treatment Rehabilitation  PERTINENT HISTORY: Per medical notes, he fell ~ 2 months ago, tearing (complete supraspinatus) Rt RTC, tore Rt TFCC as well as S-L ligament.  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 He states falling ~2 months ago and wrist bend into flexion and he tore ligaments in his wrist. He states his right hand is now swollen and stiff. He also states having a stroke this April (about 6 months ago) that affected Rt side, but he states rehabbing back to "normal" before fall. He states no significant pain at fall and none now- just swollen, tight, stiff and also poorer coordination now. He states not wearing any wrist brace after fall, tried a sling for his shoulder, which he felt "made him worse," so d/c'd it.   His niece is with him today to support him."  PRECAUTIONS: Fall and Other: Rt RTC tears   WEIGHT BEARING RESTRICTIONS Yes caution for Rt Shoulder and wrist   SUBJECTIVE:   SUBJECTIVE STATEMENT: He states feeling "down" today, he didn't eat breakfast and his meds tend to make him nauseas.    PAIN:  Are you having pain?  No  Rating: 0/10 at rest, up to 0/10 in past week at worst and states not actively avoiding use thereof  FALLS: Has patient fallen in last 6 months? Yes. Number of falls 1 - this injury  LIVING ENVIRONMENT: Lives with: Alone. He states driving since stroke. He uses a cane for stability.    OBJECTIVE: (All objective assessments below are from initial evaluation on: 06/28/22 unless otherwise specified.)    HAND DOMINANCE: Right   ADLs:  Overall ADLs: He states problems with dressing, bathing, turning door handles, and FMS buttoning with Rt hand now.   FUNCTIONAL OUTCOME MEASURES: Eval: Patient Rated Wrist Evaluation (PRWE) (modified to exclude work and recreational activities as he states not doing at baseline): Pain: 0/50; Function: 19.5/40; Total Score: 19.5/90 (Higher Score  =  More Pain and/or Debility)    UPPER EXTREMITY ROM     Shoulder to Wrist AROM Right eval Left eval Rt 07/10/22  Forearm supination 63    Forearm pronation  80    Wrist flexion 37* (tender) (40* PROM)  50* 47  Wrist  extension 28* (40* PROM)  68* 36  Wrist ulnar deviation 7*  19  Wrist radial deviation 5*  14  Functional dart thrower's motion (F-DTM) in ulnar flexion 25*    F-DTM in radial extension  27*    (Blank rows = not tested)   Hand AROM Right eval  Full Fist Ability (or Gap to Distal Palmar Crease) Loose fist (fingers barely touch palm  Thumb Opposition to Small Finger (or Gap) 1.5cm gap and slow motion  Thumb Opposition to Base of Small Finger (or Gap)  unable  (Blank rows = not tested)   UPPER EXTREMITY MMT:    Eval:  NT at eval due to concern for ligament tears, however he was stable and non-painful and this will be tested next session to help "tease out" CVA type symptoms as well. Definite weakness Rt vs Lt.   MMT Right 07/03/22  Elbow flexion 5/5 good tone, poor endurance  Elbow extension 4-/5 lower tone  Forearm supination 4+/5 good tone  Forearm pronation 4+/5 good tone  Wrist flexion 4+/5  Wrist/finger extension 3+/5  (Blank rows = not tested)  HAND FUNCTION: 07/03/22: Rt 19# grip strength (no pain); Lt: 118#   Eval: Observed weakness in affected hand.   COORDINATION: 07/03/22: Box & Blocks Test Right 21 Blocks Today (46 is WFL)   Eval: He has observed ataxic motion with finger to nose test, observed slow and poor FMS in Rt hand compared to Lt.  9HPT: Rt 46sec with several pegs dropped.   SENSATION: Eval:  Light touch intact today, he states equal b/l   EDEMA:   07/03/22: Rt hand figure of 8:   48.7cm today  (Lt hand is 48.8cm);  Rt IF base 7.5cm (Lt hand is 7.2cm)   Eval:  Mildly swollen in Rt hand and wrist today  COGNITION: Eval: Overall cognitive status: WFL for evaluation today, though states some memory issues  OBSERVATIONS:   Eval: no facial droop, but Rt sided ataxia present in UE and LE, poor tone and coordination noted as well.  Concern for exacerbation of CVA as main issue vs ligament tears and orthopedic issues.  (Rt wrist seems stable, negative  Watson's Test, no DRUJ instability, negative TFCC shear test)   TODAY'S TREATMENT:  07/19/22: OT does K-taping to hand/FA to facilitate lessening edema. He uses shoulder sh pulley to get stretch through whole Rt arm while maintaining grip, then does UBE at 4.5 resistance for 5 mins for whole arm and grip training. Next he uses red flexbar in pro, sup, wrist flex, ext with concurrent grip training (x10 each). He also use medium sized pegs for fms coordination, push/pull with hand. He had some shoulder pain/tiredness at end and limited his next activity, also wrist radial deviation was poor, so he uses flexbar to do hammer AROM x15.  He also verbally reviews developmental postures on plinth and  weightbearing and other stroke techniques.   07/17/22: He reviews/performs developmental postures on plinth with ful body flexion, rolling supine to side lying and new to prone and doing weight bearing techniques prone on elbows with shoulder bade strengthening in protraction.  He then sits up does AAROM with cane at shoulder followed by 4# weighted b/l biceps and wrist flex/ext using his cane. Last he throws and catches a soft, large ball 20x with cues to fully extend Rt arm as able. He states mildly painful but tolerable.    PATIENT EDUCATION: Education details: See tx section above for details  Person educated: Patient Education method: Verbal Instruction, Teach back, Handouts  Education comprehension: States and demonstrates understanding, Additional Education required    HOME EXERCISE PROGRAM: Access Code: [email or web address] URL: [email or web address]    GOALS: Goals reviewed with patient? Yes   SHORT TERM GOALS: (STG required if POC>30 days) Target Date: 07/13/22  Pt will obtain protective, custom orthotic. Goal status: TBD/PRN for wrist pain/instability  2.  Pt will demo/state understanding of initial HEP to improve pain levels and prerequisite motion. Goal status: 07/03/22:  Progressing    LONG TERM GOALS: Target Date: 08/10/22  Pt will improve functional ability by decreased impairment per PRWE functional assessment from 19.5/40 to 10/40 or better, for better quality of life. Goal status: INITIAL  2.  Pt will improve grip strength in Rt hand from TBDlbs to at least 40lbs for functional use at home and in IADLs. Goal status: INITIAL  3.  Pt will improve A/ROM in Rt wrist flex & ext from 37/28* respectively, to at least 40* both, to have functional motion for tasks like reach and grasp.  Goal status: INITIAL  4.  Pt will improve strength in Rt wrist flex, ext to at least 4+/5 MMT to have increased functional ability to carry out selfcare and higher-level homecare tasks with no difficulty. Goal status: INITIAL  5.  Pt will improve coordination skills in Rt hand, as seen by better score on 9HPT testing to at least 55sec to have increased functional ability to carry out fine motor tasks (fasteners, etc.) and more complex, coordinated IADLs (meal prep, sports, etc.).  Goal status: INITIAL    ASSESSMENT:  CLINICAL IMPRESSION: 07/19/22: He is doing better with observed motion and ability now.   07/17/22: He has less pain, more appetite recently. He has PT CVA eval this Friday and OT is working to transition him to OT at neuro center as well, for his convenience   PLAN: OT FREQUENCY: 2x/week  OT DURATION: 6 weeks  PLANNED INTERVENTIONS: self care/ADL training, therapeutic exercise, therapeutic activity, neuromuscular re-education, manual therapy, passive range of motion, functional mobility training, splinting, electrical stimulation, ultrasound, fluidotherapy, compression bandaging, moist heat, cryotherapy, contrast bath, patient/family education, and coping strategies training  RECOMMENDED OTHER SERVICES: is getting PT for shoulder tears right now. PT was recommended to assess and tx for LE weakness/stroke signs as well. He was also recommended to see  cardiologist/neurologist about possible stroke evolution.   CONSULTED AND AGREED WITH PLAN OF CARE: Patient and family member/caregiver  PLAN FOR NEXT SESSION:  Check on referral to neuro OT, consider reassess and d/c when appropriate or he is ready to go to neuro center.    Fannie Knee, OTR/L, CHT 07/19/2022, 5:28 PM

## 2022-07-19 ENCOUNTER — Encounter: Payer: Self-pay | Admitting: Rehabilitative and Restorative Service Providers"

## 2022-07-19 ENCOUNTER — Ambulatory Visit: Payer: Medicare HMO | Admitting: Rehabilitative and Restorative Service Providers"

## 2022-07-19 DIAGNOSIS — R278 Other lack of coordination: Secondary | ICD-10-CM | POA: Diagnosis not present

## 2022-07-19 DIAGNOSIS — G8929 Other chronic pain: Secondary | ICD-10-CM

## 2022-07-19 DIAGNOSIS — I63512 Cerebral infarction due to unspecified occlusion or stenosis of left middle cerebral artery: Secondary | ICD-10-CM

## 2022-07-19 DIAGNOSIS — M25631 Stiffness of right wrist, not elsewhere classified: Secondary | ICD-10-CM | POA: Diagnosis not present

## 2022-07-19 DIAGNOSIS — M25511 Pain in right shoulder: Secondary | ICD-10-CM

## 2022-07-19 DIAGNOSIS — R600 Localized edema: Secondary | ICD-10-CM

## 2022-07-19 DIAGNOSIS — M6281 Muscle weakness (generalized): Secondary | ICD-10-CM | POA: Diagnosis not present

## 2022-07-20 ENCOUNTER — Ambulatory Visit: Payer: Medicare HMO | Attending: Physician Assistant | Admitting: Physical Therapy

## 2022-07-20 ENCOUNTER — Emergency Department (HOSPITAL_COMMUNITY)
Admission: EM | Admit: 2022-07-20 | Discharge: 2022-07-20 | Disposition: A | Discharge status: Home / Self Care | Payer: Medicare HMO | Attending: Emergency Medicine | Admitting: Emergency Medicine

## 2022-07-20 ENCOUNTER — Other Ambulatory Visit: Payer: Self-pay

## 2022-07-20 VITALS — BP 218/88 | HR 81

## 2022-07-20 DIAGNOSIS — R600 Localized edema: Secondary | ICD-10-CM | POA: Diagnosis present

## 2022-07-20 DIAGNOSIS — I6381 Other cerebral infarction due to occlusion or stenosis of small artery: Secondary | ICD-10-CM | POA: Diagnosis present

## 2022-07-20 DIAGNOSIS — M6281 Muscle weakness (generalized): Secondary | ICD-10-CM | POA: Diagnosis present

## 2022-07-20 DIAGNOSIS — D649 Anemia, unspecified: Secondary | ICD-10-CM

## 2022-07-20 DIAGNOSIS — M25511 Pain in right shoulder: Secondary | ICD-10-CM | POA: Diagnosis present

## 2022-07-20 DIAGNOSIS — I129 Hypertensive chronic kidney disease with stage 1 through stage 4 chronic kidney disease, or unspecified chronic kidney disease: Secondary | ICD-10-CM | POA: Diagnosis not present

## 2022-07-20 DIAGNOSIS — I69351 Hemiplegia and hemiparesis following cerebral infarction affecting right dominant side: Secondary | ICD-10-CM | POA: Diagnosis not present

## 2022-07-20 DIAGNOSIS — G8929 Other chronic pain: Secondary | ICD-10-CM | POA: Insufficient documentation

## 2022-07-20 DIAGNOSIS — I63512 Cerebral infarction due to unspecified occlusion or stenosis of left middle cerebral artery: Secondary | ICD-10-CM | POA: Diagnosis present

## 2022-07-20 DIAGNOSIS — Z7982 Long term (current) use of aspirin: Secondary | ICD-10-CM | POA: Diagnosis not present

## 2022-07-20 DIAGNOSIS — R2681 Unsteadiness on feet: Secondary | ICD-10-CM | POA: Diagnosis present

## 2022-07-20 DIAGNOSIS — M25631 Stiffness of right wrist, not elsewhere classified: Secondary | ICD-10-CM | POA: Diagnosis present

## 2022-07-20 DIAGNOSIS — N189 Chronic kidney disease, unspecified: Secondary | ICD-10-CM | POA: Insufficient documentation

## 2022-07-20 DIAGNOSIS — I1A0 Resistant hypertension: Secondary | ICD-10-CM

## 2022-07-20 DIAGNOSIS — R278 Other lack of coordination: Secondary | ICD-10-CM | POA: Insufficient documentation

## 2022-07-20 DIAGNOSIS — Z79899 Other long term (current) drug therapy: Secondary | ICD-10-CM | POA: Diagnosis not present

## 2022-07-20 DIAGNOSIS — R030 Elevated blood-pressure reading, without diagnosis of hypertension: Secondary | ICD-10-CM | POA: Diagnosis present

## 2022-07-20 LAB — BASIC METABOLIC PANEL
Anion gap: 6 (ref 5–15)
BUN: 30 mg/dL — ABNORMAL HIGH (ref 8–23)
CO2: 26 mmol/L (ref 22–32)
Calcium: 9.1 mg/dL (ref 8.9–10.3)
Chloride: 107 mmol/L (ref 98–111)
Creatinine, Ser: 1.32 mg/dL — ABNORMAL HIGH (ref 0.61–1.24)
GFR, Estimated: 56 mL/min — ABNORMAL LOW (ref >60)
Glucose, Bld: 177 mg/dL — ABNORMAL HIGH (ref 70–99)
Potassium: 4.1 mmol/L (ref 3.5–5.1)
Sodium: 139 mmol/L (ref 135–145)

## 2022-07-20 LAB — CBC
HCT: 34.4 % — ABNORMAL LOW (ref 39.0–52.0)
Hemoglobin: 11.3 g/dL — ABNORMAL LOW (ref 13.0–17.0)
MCH: 32 pg (ref 26.0–34.0)
MCHC: 32.8 g/dL (ref 30.0–36.0)
MCV: 97.5 fL (ref 80.0–100.0)
Platelets: 198 10*3/uL (ref 150–400)
RBC: 3.53 MIL/uL — ABNORMAL LOW (ref 4.22–5.81)
RDW: 11.7 % (ref 11.5–15.5)
WBC: 5.9 10*3/uL (ref 4.0–10.5)
nRBC: 0 % (ref 0.0–0.2)

## 2022-07-20 NOTE — Therapy (Signed)
OUTPATIENT PHYSICAL THERAPY NEURO EVALUATION   Patient Name: Jeshua Ransford MRN: [id number] DOB:August 16, 1946, 76 y.o., male Today's Date: 07/20/2022   PCP: Loura Back NP REFERRING PROVIDER: Micki Riley, MD   PT End of Session - 07/20/22 0932     Visit Number 1    Number of Visits 9   with eval   Date for PT Re-Evaluation 08/31/22   to allow for scheduling conflicts   Authorization Type [employer]    PT Start Time 0930    PT Stop Time 1010   sent to ED with hypertension   PT Time Calculation (min) 40 min    Activity Tolerance Other (comment)   sent to ED due to hypertension   Behavior During Therapy [facility] for tasks assessed/performed             Past Medical History:  Diagnosis Date   CVA (cerebral vascular accident) [facility])    april 2023   Diabetes mellitus without complication (HCC)    Hypertension    No past surgical history on file. 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 Pt believes he may have had another stroke, has not followed up with his PCP or neurologist. Pt was being seen most recently by PT for treatment of his R shoulder pain (from his fall in August), has completed those visits.  Pt accompanied by: family member (niece [name])  PERTINENT HISTORY: hypertension, hyperlipidemia, diabetes mellitus, CKD stage III, CHF with ejection fraction of 25 to 30%, medical noncompliance, recent CVA left hemispheric subcortical  PAIN:  Are you having pain? No  PRECAUTIONS: Fall  WEIGHT BEARING RESTRICTIONS: No  FALLS: Has patient fallen in last 6 months? Yes. Number of falls 1 in August that led to R shoulder injury (tear in rotator cuff muscles)  LIVING ENVIRONMENT: Lives with: lives alone Lives in: House/apartment Stairs: No (can use elevator) Has following equipment at home: Single point cane  PLOF: Independent with gait, Independent with transfers, and Requires assistive device for independence  PATIENT GOALS: "strengthen my right leg and my right arm" "I want to be able to swing a golf club with RUE"  OBJECTIVE:   DIAGNOSTIC FINDINGS: Brain MRI 01/14/22 IMPRESSION: Acute infarction involving the left corona radiata, caudate body, and posterior lentiform nucleus.  R shoulder MRI 05/25/22 IMPRESSION: Complete supraspinatus and near complete subscapularis tendon tears with retraction to the glenohumeral joint. No fatty replacement is present but there is edema in the supraspinatus and infraspinatus muscle bellies compatible early atrophy. Severe tendinopathy of the intra-articular long head of biceps and infraspinatus. Bulky acromioclavicular osteoarthritis and subacromial spurring. Moderate glenohumeral osteoarthritis. Debris containing fluid in the subacromial/subdeltoid bursa.  COGNITION: Overall cognitive status: Within functional limits for tasks  assessed   SENSATION: Impaired: numbness on medial portion of R foot; pt reports a feeling of "heaviness" in his R hand and R foot; proprioception intact per pt report  COORDINATION: Slightly decreased in RLE  EDEMA:  In R hand  POSTURE: slightly rounded shoulders   LOWER EXTREMITY MMT:    MMT Right Eval Left Eval  Hip flexion 4 5  Hip extension    Hip abduction    Hip adduction    Hip internal rotation    Hip external rotation    Knee flexion 4 5  Knee extension 4 5  Ankle dorsiflexion 2- 5  Ankle plantarflexion    Ankle inversion    Ankle eversion    (Blank rows = not tested)  BED MOBILITY:  Independent per pt report  TRANSFERS: Assistive device utilized: None  Sit to stand: Modified independence *increased time needed Stand to sit: Modified independence Chair to chair: Modified independence Floor:  not assessed at eval  When standing up pt puts on all weight on LLE, braces against chair with LLE, poor eccentric control when sitting  GAIT: Gait pattern: decreased step length- Left, decreased ankle dorsiflexion- Left, circumduction- Left, Left hip hike, and poor foot clearance- Left Distance walked: 35 ft Assistive device utilized: Single point cane Level of assistance: SBA Comments: assessed without use of R PLS AFO as pt does not wear it at home  Unable to assess gait with AFO as pt send to ED due to hypertension  Vitals:   07/20/22 1007 07/20/22 1008  BP: (!) 200/91 (!) 218/88  Pulse:  81   Pt educated on BP readings and importance of monitoring BP, instructed pt to go to ED due to elevated BP this AM.   FUNCTIONAL TESTS:   [facility] PT Assessment - 07/20/22 1314       Standardized Balance Assessment   Standardized Balance Assessment Five Times Sit to Stand    Five times sit to stand comments  12.12 sec   pushing up with LUE from arm of chair           TODAY'S TREATMENT:  PT evaluation    PATIENT EDUCATION: Education details: Eval findings, BP management and to go to ED due to elevated readings Person educated: Patient and niece [name] Education method: Medical illustrator Education comprehension: verbalized understanding  HOME EXERCISE PROGRAM: To be established in future sessions  GOALS: Goals reviewed with patient? Yes  SHORT TERM GOALS=LONG-TERM GOALS due to length of POC  LONG TERM GOALS: Target date: 08/17/2022  Pt will be independent with final HEP for improved strength, balance, transfers and gait. Baseline:  Goal status: INITIAL  2.  TUG to be assessed and LTG set Baseline:  Goal status: INITIAL  3.  FGA to be assessed and LTG set Baseline:  Goal status: INITIAL  4.  Gait speed to be assessed and LTG set Baseline:  Goal status: INITIAL   ASSESSMENT:  CLINICAL IMPRESSION: Patient is a 76 year old male referred to Neuro [facility] for R-side weakness following CVA.   Pt's PMH is significant for: hypertension, hyperlipidemia, diabetes mellitus, CKD stage III, CHF with ejection fraction of 25 to 30%, medical noncompliance, recent CVA left hemispheric subcortical region. The following deficits were present during the exam: decreased RLE strength and control during gait. Based on his RLE weakness and decreased control as well as history of falls, pt is an increased risk for falls. Evaluation limited by pt's hypertension this date and unable to fully assess objective measures, will complete assessment next visit. Pt would benefit from skilled PT to address these impairments and functional limitations to maximize functional mobility independence.    OBJECTIVE IMPAIRMENTS: Abnormal gait, decreased balance, decreased coordination, decreased endurance, decreased mobility, and decreased strength.   ACTIVITY LIMITATIONS: carrying, lifting, bending, squatting, stairs, and  transfers  PARTICIPATION LIMITATIONS: community activity  PERSONAL FACTORS: Age and 1-2 comorbidities:    hypertension, hyperlipidemia, diabetes mellitus, CKD stage III, CHF with ejection fraction of 25 to 30%, medical noncompliance, recent CVA left hemispheric subcortical region are also affecting patient's functional outcome.   REHAB POTENTIAL: Good  CLINICAL DECISION MAKING: Stable/uncomplicated  EVALUATION COMPLEXITY: Low  PLAN:  PT FREQUENCY: 2x/week  PT DURATION: 6 weeks (9 visits)  PLANNED INTERVENTIONS: Therapeutic exercises, Therapeutic activity, Neuromuscular re-education, Balance training, Gait training, Patient/Family education, Self Care, Joint mobilization, Stair training, Vestibular training, Canalith repositioning, Visual/preceptual remediation/compensation, Orthotic/Fit training, DME instructions, Aquatic Therapy, Dry Needling, Electrical stimulation, Cryotherapy, Moist heat, Taping, Manual therapy, and Re-evaluation  PLAN FOR NEXT SESSION: check BP!, assess gait speed, TUG, FGA and write LTG, assess gait with use of AFO   Peter Congo, PT, DPT, CSRS 07/20/2022, 1:16 PM

## 2022-07-20 NOTE — ED Notes (Signed)
MD in room

## 2022-07-20 NOTE — ED Triage Notes (Signed)
Pt reports being at therapy and bp was 218/81 and was sent to ED. Pt denies CP, dizziness, headache, shob Hx HTN with stroke April 2023. Denies Blood thinner usage.

## 2022-07-20 NOTE — Discharge Instructions (Addendum)
He is continue to monitor your blood pressure at home and create log Call Dr. Weber Cooks office or your primary care office, for further location adjustments Return if you are having worsening symptoms especially weakness or headache or chest pain at any time.

## 2022-07-20 NOTE — ED Provider Notes (Signed)
[facility]-EMERGENCY DEPT Provider Note   CSN: [id number] Arrival date & time: 07/20/22  1027     History  Chief Complaint  Patient presents with   Hypertension    Jeanpierre Thebeau is a 76 y.o. male.  HPI   76 year old male history of ischemic stroke, hypertension, on multiple home medications presents today from rehab with reports of high blood pressure.  Patient's home medications are Bigelow, hydralazine, isosorbide, Entresto, and spironolactone.  He takes his once a day medications in the morning.  He took all of his medications this morning.  He was returning to his rehab after recent fall.  He states his blood pressure was taken was elevated there after exerting himself requiring standing up and sitting down multiple times.  Did not have any other symptoms with the high blood pressure.  He denies headache, chest pain, dyspnea, new stroke symptoms.  He is followed by Dr. Admission with cardiology for his blood pressure  Home Medications Prior to Admission medications   Medication Sig Start Date End Date Taking? Authorizing Provider  aspirin EC 81 MG tablet Take 1 tablet (81 mg total) by mouth daily. Swallow whole. 02/20/22   Tereso Newcomer T, PA-C  atorvastatin (LIPITOR) 40 MG tablet Take 1 tablet (40 mg total) by mouth daily. 03/07/22   Tereso Newcomer T, PA-C  carvedilol (COREG) 25 MG tablet Take 1 tablet (25 mg total) by mouth 2 (two) times daily. 04/11/22   Tereso Newcomer T, PA-C  Cholecalciferol (VITAMIN D3 PO) Take 1 capsule by mouth daily.    [provider]  Coenzyme Q10 (COQ10) 400 MG CAPS Take 1 capsule by mouth daily at 6 (six) AM.    [provider]  hydrALAZINE (APRESOLINE) 100 MG tablet TAKE 1 TABLET BY MOUTH 3 TIMES DAILY. 04/27/22   Tereso Newcomer T, PA-C  isosorbide mononitrate (IMDUR) 120 MG 24 hr tablet Take 1 tablet (120 mg total) by mouth daily. 03/07/22   Tereso Newcomer T, PA-C  Misc Natural Products (PUMPKIN SEED OIL) CAPS Take 2  capsules by mouth 2 (two) times daily.    [provider]  sacubitril-valsartan (ENTRESTO) 97-103 MG Take 1 tablet by mouth 2 (two) times daily. 05/08/22   Wendall Stade, MD  spironolactone (ALDACTONE) 50 MG tablet Take 0.5 tablets (25 mg total) by mouth daily. 05/15/22   Wendall Stade, MD      Allergies    Amlodipine    Review of Systems   Review of Systems  Physical Exam Updated Vital Signs BP (!) 162/80   Pulse 73   Temp 98.6 F (37 C) (Oral)   Resp 18   SpO2 98%  Physical Exam Vitals and nursing note reviewed.  Constitutional:      Appearance: Normal appearance.  HENT:     Head: Normocephalic.     Right Ear: External ear normal.     Left Ear: External ear normal.     Nose: Nose normal.     Mouth/Throat:     Pharynx: Oropharynx is clear.  Eyes:     Pupils: Pupils are equal, round, and reactive to light.  Cardiovascular:     Rate and Rhythm: Normal rate and regular rhythm.  Pulmonary:     Effort: Pulmonary effort is normal.     Breath sounds: Normal breath sounds.  Abdominal:     General: Abdomen is flat.     Palpations: Abdomen is soft.  Musculoskeletal:        General: Normal  range of motion.     Cervical back: Normal range of motion.  Skin:    General: Skin is warm and dry.     Capillary Refill: Capillary refill takes less than 2 seconds.  Neurological:     Mental Status: He is alert.     ED Results / Procedures / Treatments   Labs (all labs ordered are listed, but only abnormal results are displayed) Labs Reviewed  CBC - Abnormal; Notable for the following components:      Result Value   RBC 3.53 (*)    Hemoglobin 11.3 (*)    HCT 34.4 (*)    All other components within normal limits  BASIC METABOLIC PANEL - Abnormal; Notable for the following components:   Glucose, Bld 177 (*)    BUN 30 (*)    Creatinine, Ser 1.32 (*)    GFR, Estimated 56 (*)    All other components within normal limits    EKG None  Radiology No results  found.  Procedures Procedures    Medications Ordered in ED Medications - No data to display  ED Course/ Medical Decision Making/ A&P Clinical Course as of 07/20/22 1206  Fri Jul 20, 2022  1937 Basic metabolic panel reviewed interpreted significant for elevated creatinine however stable from prior [DR]  1150 CBC is reviewed and interpreted significant for hemoglobin decreased from 13-11.  No obvious causes noted last CBC was 5 months ago patient is advised regarding need for follow-up [DR]    Clinical Course User Index [DR] Pattricia Boss, MD                           Medical Decision Making Patient presents today with high blood pressure at rehab.  This was with exertion. On his initial blood pressure here is 184/92. Will monitor and check EKG and labs Recheck blood pressure 162/80.  This is close to patient's baseline He has no evidence of new stroke here in the ED. Patient advised to follow-up with his doctor for rechecks of blood pressures and medication adjustments as needed  Amount and/or Complexity of Data Reviewed Labs: ordered. Decision-making details documented in ED Course.           Final Clinical Impression(s) / ED Diagnoses Final diagnoses:  Resistant hypertension  Anemia, unspecified type  Chronic kidney disease, unspecified CKD stage    Rx / DC Orders ED Discharge Orders     None         Pattricia Boss, MD 07/20/22 1207

## 2022-07-23 NOTE — Therapy (Signed)
OUTPATIENT OCCUPATIONAL THERAPY TREATMENT NOTE  Patient Name: Joseph Patrick MRN: [id number] DOB:1945/11/15, 76 y.o., male Today's Date: 07/23/2022  PCP: Loura Back, NP REFERRING PROVIDER: Tarry Kos, MD      Past Medical History:  Diagnosis Date   CVA (cerebral vascular accident) [facility])    april 2023   Diabetes mellitus without complication (HCC)    Hypertension    No past surgical history on file. 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  Rationale for Evaluation and Treatment Rehabilitation  PERTINENT HISTORY: Per medical notes, he fell ~ 2 months ago, tearing (complete supraspinatus) Rt RTC, tore Rt TFCC as well as S-L ligament.  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 He states falling ~2 months ago and wrist bend into flexion and he tore ligaments in his wrist. He states his right hand is now swollen and stiff. He also states having a stroke this April (about 6 months ago) that affected Rt side, but he states rehabbing back to "normal" before fall. He states no significant pain at fall and none now- just swollen, tight, stiff and also poorer coordination now. He states not wearing any wrist brace after fall, tried a sling for his shoulder, which he felt "made him worse," so d/c'd it.   His niece is with him today to support him."  PRECAUTIONS: Fall and Other: Rt RTC  tears   WEIGHT BEARING RESTRICTIONS Yes caution for Rt Shoulder and wrist   SUBJECTIVE:   SUBJECTIVE STATEMENT: He states ***  feeling "down" today, he didn't eat breakfast and his meds tend to make him nauseas.    PAIN:  Are you having pain?  No  Rating: 0/10 at rest, up to 0/10 in past week at worst and states not actively avoiding use thereof  FALLS: Has patient fallen in last 6 months? Yes. Number of falls 1 - this injury  LIVING ENVIRONMENT: Lives with: Alone. He states driving since stroke. He uses a cane for stability.    OBJECTIVE: (All objective assessments below are from initial evaluation on: 06/28/22 unless otherwise specified.)    HAND DOMINANCE: Right   ADLs: Overall ADLs: He states problems with dressing, bathing, turning door handles, and FMS buttoning with Rt hand now.   FUNCTIONAL OUTCOME MEASURES: Eval: Patient Rated Wrist Evaluation (PRWE) (modified to exclude work and recreational activities as he states not doing at baseline): Pain: 0/50; Function: 19.5/40; Total Score: 19.5/90 (Higher Score  =  More Pain and/or Debility)    UPPER EXTREMITY ROM     Shoulder to Wrist AROM Right eval Left eval Rt 07/10/22  Forearm supination 63    Forearm pronation  80    Wrist flexion 37* (tender) (40* PROM)  50* 47  Wrist extension 28* (40* PROM)  68* 36  Wrist ulnar deviation 7*  19  Wrist radial deviation 5*  14  Functional dart thrower's  motion (F-DTM) in ulnar flexion 25*    F-DTM in radial extension  27*    (Blank rows = not tested)   Hand AROM Right eval  Full Fist Ability (or Gap to Distal Palmar Crease) Loose fist (fingers barely touch palm  Thumb Opposition to Small Finger (or Gap) 1.5cm gap and slow motion  Thumb Opposition to Base of Small Finger (or Gap)  unable  (Blank rows = not tested)   UPPER EXTREMITY MMT:    Eval:  NT at eval due to concern for ligament tears, however he was stable and non-painful and this will be tested next  session to help "tease out" CVA type symptoms as well. Definite weakness Rt vs Lt.   MMT Right 07/03/22  Elbow flexion 5/5 good tone, poor endurance  Elbow extension 4-/5 lower tone  Forearm supination 4+/5 good tone  Forearm pronation 4+/5 good tone  Wrist flexion 4+/5  Wrist/finger extension 3+/5  (Blank rows = not tested)  HAND FUNCTION: 07/24/22: ***  07/03/22: Rt 19# grip strength (no pain); Lt: 118#   Eval: Observed weakness in affected hand.   COORDINATION: 07/03/22: Box & Blocks Test Right 21 Blocks Today (46 is WFL)   Eval: He has observed ataxic motion with finger to nose test, observed slow and poor FMS in Rt hand compared to Lt.  9HPT: Rt 5min 46sec with several pegs dropped.   SENSATION: Eval:  Light touch intact today, he states equal b/l   EDEMA:   07/03/22: Rt hand figure of 8:   48.7cm today  (Lt hand is 48.8cm);  Rt IF base 7.5cm (Lt hand is 7.2cm)   Eval:  Mildly swollen in Rt hand and wrist today  COGNITION: Eval: Overall cognitive status: WFL for evaluation today, though states some memory issues  OBSERVATIONS:   Eval: no facial droop, but Rt sided ataxia present in UE and LE, poor tone and coordination noted as well.  Concern for exacerbation of CVA as main issue vs ligament tears and orthopedic issues.  (Rt wrist seems stable, negative Watson's Test, no DRUJ instability, negative TFCC shear test)   TODAY'S TREATMENT:  07/24/22: ***  07/19/22: OT does K-taping to hand/FA to facilitate lessening edema. He uses shoulder sh pulley to get stretch through whole Rt arm while maintaining grip, then does UBE at 4.5 resistance for 5 mins for whole arm and grip training. Next he uses red flexbar in pro, sup, wrist flex, ext with concurrent grip training (x10 each). He also use medium sized pegs for fms coordination, push/pull with hand. He had some shoulder pain/tiredness at end and limited his next activity, also wrist radial deviation was poor, so he uses flexbar  to do hammer AROM x15.  He also verbally reviews developmental postures on plinth and weightbearing and other stroke techniques.   07/17/22: He reviews/performs developmental postures on plinth with ful body flexion, rolling supine to side lying and new to prone and doing weight bearing techniques prone on elbows with shoulder bade strengthening in protraction.  He then sits up does AAROM with cane at shoulder followed by 4# weighted b/l biceps and wrist flex/ext using his cane. Last he throws and catches a soft, large ball 20x with cues to fully extend Rt arm as able. He states mildly painful but tolerable.    PATIENT EDUCATION: Education details: See tx section above for details  Person educated: Patient Education method: Verbal Instruction, Teach back, Handouts  Education comprehension: States and demonstrates understanding, Additional Education required  HOME EXERCISE PROGRAM: Access Code: [email or web address] URL: [email or web address]    GOALS: Goals reviewed with patient? Yes   SHORT TERM GOALS: (STG required if POC>30 days) Target Date: 07/13/22  Pt will obtain protective, custom orthotic. Goal status: TBD/PRN for wrist pain/instability  2.  Pt will demo/state understanding of initial HEP to improve pain levels and prerequisite motion. Goal status: 07/03/22: Progressing    LONG TERM GOALS: Target Date: 08/10/22  Pt will improve functional ability by decreased impairment per PRWE functional assessment from 19.5/40 to 10/40 or better, for better quality of life. Goal status: INITIAL  2.  Pt will improve grip strength in Rt hand from TBDlbs to at least 40lbs for functional use at home and in IADLs. Goal status: INITIAL  3.  Pt will improve A/ROM in Rt wrist flex & ext from 37/28* respectively, to at least 40* both, to have functional motion for tasks like reach and grasp.  Goal status: INITIAL  4.  Pt will improve strength in Rt wrist flex, ext to at least 4+/5  MMT to have increased functional ability to carry out selfcare and higher-level homecare tasks with no difficulty. Goal status: INITIAL  5.  Pt will improve coordination skills in Rt hand, as seen by better score on 9HPT testing to at least 55sec to have increased functional ability to carry out fine motor tasks (fasteners, etc.) and more complex, coordinated IADLs (meal prep, sports, etc.).  Goal status: INITIAL    ASSESSMENT:  CLINICAL IMPRESSION: 07/24/22: ***  07/19/22: He is doing better with observed motion and ability now.   07/17/22: He has less pain, more appetite recently. He has PT CVA eval this Friday and OT is working to transition him to OT at neuro center as well, for his convenience   PLAN: OT FREQUENCY: 2x/week  OT DURATION: 6 weeks  PLANNED INTERVENTIONS: self care/ADL training, therapeutic exercise, therapeutic activity, neuromuscular re-education, manual therapy, passive range of motion, functional mobility training, splinting, electrical stimulation, ultrasound, fluidotherapy, compression bandaging, moist heat, cryotherapy, contrast bath, patient/family education, and coping strategies training  RECOMMENDED OTHER SERVICES: is getting PT for shoulder tears right now. PT was recommended to assess and tx for LE weakness/stroke signs as well. He was also recommended to see cardiologist/neurologist about possible stroke evolution.   CONSULTED AND AGREED WITH PLAN OF CARE: Patient and family member/caregiver  PLAN FOR NEXT SESSION:  ***  Check on referral to neuro OT, consider reassess and d/c when appropriate or he is ready to go to neuro center.    Fannie Knee, OTR/L, CHT 07/23/2022, 11:09 AM

## 2022-07-24 ENCOUNTER — Ambulatory Visit: Payer: Medicare HMO | Admitting: Rehabilitative and Restorative Service Providers"

## 2022-07-24 ENCOUNTER — Encounter: Payer: Self-pay | Admitting: Rehabilitative and Restorative Service Providers"

## 2022-07-24 DIAGNOSIS — M6281 Muscle weakness (generalized): Secondary | ICD-10-CM | POA: Diagnosis not present

## 2022-07-24 DIAGNOSIS — M25511 Pain in right shoulder: Secondary | ICD-10-CM

## 2022-07-24 DIAGNOSIS — I63512 Cerebral infarction due to unspecified occlusion or stenosis of left middle cerebral artery: Secondary | ICD-10-CM

## 2022-07-24 DIAGNOSIS — R600 Localized edema: Secondary | ICD-10-CM

## 2022-07-24 DIAGNOSIS — R278 Other lack of coordination: Secondary | ICD-10-CM

## 2022-07-24 DIAGNOSIS — G8929 Other chronic pain: Secondary | ICD-10-CM

## 2022-07-24 DIAGNOSIS — M25631 Stiffness of right wrist, not elsewhere classified: Secondary | ICD-10-CM

## 2022-07-25 ENCOUNTER — Encounter: Payer: Self-pay | Admitting: Physical Therapy

## 2022-07-25 ENCOUNTER — Ambulatory Visit: Payer: Medicare HMO | Admitting: Physical Therapy

## 2022-07-25 VITALS — BP 176/78 | HR 76

## 2022-07-25 DIAGNOSIS — R278 Other lack of coordination: Secondary | ICD-10-CM

## 2022-07-25 DIAGNOSIS — I69351 Hemiplegia and hemiparesis following cerebral infarction affecting right dominant side: Secondary | ICD-10-CM | POA: Diagnosis not present

## 2022-07-25 DIAGNOSIS — M6281 Muscle weakness (generalized): Secondary | ICD-10-CM

## 2022-07-25 NOTE — Patient Instructions (Signed)
Access Code: [email or web address] URL: [email or web address] Date: 07/25/2022 Prepared by: Camille Bal  Exercises - Staggered Sit-to-Stand  - 1 x daily - 4-5 x weekly - 2-3 sets - 10 reps - Standing Tandem Balance with Counter Support  - 1 x daily - 5 x weekly - 1 sets - 2-3 reps - 30-45 seconds hold - Tandem Walking with Counter Support  - 1 x daily - 5-6 x weekly - 3 sets - 10 reps

## 2022-07-25 NOTE — Therapy (Signed)
OUTPATIENT PHYSICAL THERAPY NEURO EVALUATION   Patient Name: Joseph Patrick MRN: [id number] DOB:1946/02/18, 76 y.o., male Today's Date: 07/25/2022   PCP: Loura Back NP REFERRING PROVIDER: Micki Riley, MD   PT End of Session - 07/25/22 1450     Visit Number 2    Number of Visits 9   with eval   Date for PT Re-Evaluation 08/31/22   to allow for scheduling conflicts   Authorization Type [employer]    PT Start Time 1445    PT Stop Time 1530    PT Time Calculation (min) 45 min    Activity Tolerance Patient tolerated treatment well    Behavior During Therapy [facility] for tasks assessed/performed             Past Medical History:  Diagnosis Date   CVA (cerebral vascular accident) [facility])    april 2023   Diabetes mellitus without complication (HCC)    Hypertension    History reviewed. No pertinent surgical history. 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) 01/14/2022   HFrEF (heart failure with reduced ejection fraction) (HCC)    Essential hypertension 01/07/2022   Hyperlipidemia 01/07/2022   Diabetes (HCC) 01/07/2022   Stage 3a chronic kidney disease (CKD) (HCC) 01/07/2022   Transient cerebral ischemia 01/06/2022    ONSET DATE: 07/12/2022  REFERRING DIAG: Q76.195 (ICD-10-CM) - Spastic hemiplegia of right dominant side as late effect of cerebral infarction (HCC) I63.81 (ICD-10-CM) - Lacunar stroke (HCC)  THERAPY DIAG:  Muscle weakness (generalized)  Other lack of coordination  Rationale for Evaluation and Treatment: Rehabilitation  SUBJECTIVE:                                                                                                                                                                                             SUBJECTIVE STATEMENT: Pt reports he sees OT again tomorrow.  He denies falls since last visit.  Pt  accompanied by: family member (niece [name])  PERTINENT HISTORY: hypertension, hyperlipidemia, diabetes mellitus, CKD stage III, CHF with ejection fraction of 25 to 30%, medical noncompliance, recent CVA left hemispheric subcortical  PAIN:  Are you having pain? No  PRECAUTIONS: Fall  WEIGHT BEARING RESTRICTIONS: No  FALLS: Has patient fallen in last 6 months? Yes. Number of falls 1 in August that led to R shoulder injury (tear in rotator cuff muscles)  LIVING ENVIRONMENT: Lives with: lives alone Lives in: House/apartment Stairs: No (can use elevator) Has following equipment at home: Single point cane  PLOF:  Independent with gait, Independent with transfers, and Requires assistive device for independence  PATIENT GOALS: "strengthen my right leg and my right arm" "I want to be able to swing a golf club with RUE"  OBJECTIVE:    Vitals:   07/25/22 1447  BP: (!) 176/78  Pulse: 76    FUNCTIONAL TESTS:   [facility] PT Assessment - 07/25/22 1501       Functional Gait  Assessment   Gait assessed  Yes    Gait Level Surface Walks 20 ft in less than 7 sec but greater than 5.5 sec, uses assistive device, slower speed, mild gait deviations, or deviates 6-10 in outside of the 12 in walkway width.    Change in Gait Speed Makes only minor adjustments to walking speed, or accomplishes a change in speed with significant gait deviations, deviates 10-15 in outside the 12 in walkway width, or changes speed but loses balance but is able to recover and continue walking.    Gait with Horizontal Head Turns Performs head turns smoothly with slight change in gait velocity (eg, minor disruption to smooth gait path), deviates 6-10 in outside 12 in walkway width, or uses an assistive device.    Gait with Vertical Head Turns Performs task with slight change in gait velocity (eg, minor disruption to smooth gait path), deviates 6 - 10 in outside 12 in walkway width or uses assistive device    Gait and Pivot Turn  Turns slowly, requires verbal cueing, or requires several small steps to catch balance following turn and stop    Step Over Obstacle Is able to step over one shoe box (4.5 in total height) but must slow down and adjust steps to clear box safely. May require verbal cueing.    Gait with Narrow Base of Support Ambulates less than 4 steps heel to toe or cannot perform without assistance.    Gait with Eyes Closed Walks 20 ft, uses assistive device, slower speed, mild gait deviations, deviates 6-10 in outside 12 in walkway width. Ambulates 20 ft in less than 9 sec but greater than 7 sec.    Ambulating Backwards Walks 20 ft, slow speed, abnormal gait pattern, evidence for imbalance, deviates 10-15 in outside 12 in walkway width.    Steps Alternating feet, must use rail.    Total Score 14    FGA comment: 14/30; performed w/o SPC             TODAY'S TREATMENT:                                                                                                                              - : 12.44 sec w/ SPC and R AFO = 2.65 ft/sec OR 0.80 m/sec -TUG w/ SPC and R AFO = 14.35 sec -FGA:  [facility] PT Assessment - 07/25/22 1501       Functional Gait  Assessment   Gait assessed  Yes    Gait Level Surface  Walks 20 ft in less than 7 sec but greater than 5.5 sec, uses assistive device, slower speed, mild gait deviations, or deviates 6-10 in outside of the 12 in walkway width.    Change in Gait Speed Makes only minor adjustments to walking speed, or accomplishes a change in speed with significant gait deviations, deviates 10-15 in outside the 12 in walkway width, or changes speed but loses balance but is able to recover and continue walking.    Gait with Horizontal Head Turns Performs head turns smoothly with slight change in gait velocity (eg, minor disruption to smooth gait path), deviates 6-10 in outside 12 in walkway width, or uses an assistive device.    Gait with Vertical Head Turns Performs task with  slight change in gait velocity (eg, minor disruption to smooth gait path), deviates 6 - 10 in outside 12 in walkway width or uses assistive device    Gait and Pivot Turn Turns slowly, requires verbal cueing, or requires several small steps to catch balance following turn and stop    Step Over Obstacle Is able to step over one shoe box (4.5 in total height) but must slow down and adjust steps to clear box safely. May require verbal cueing.    Gait with Narrow Base of Support Ambulates less than 4 steps heel to toe or cannot perform without assistance.    Gait with Eyes Closed Walks 20 ft, uses assistive device, slower speed, mild gait deviations, deviates 6-10 in outside 12 in walkway width. Ambulates 20 ft in less than 9 sec but greater than 7 sec.    Ambulating Backwards Walks 20 ft, slow speed, abnormal gait pattern, evidence for imbalance, deviates 10-15 in outside 12 in walkway width.    Steps Alternating feet, must use rail.    Total Score 14    FGA comment: 14/30; performed w/o SPC            GAIT: Gait pattern: step through pattern, decreased arm swing- Right, decreased stride length, decreased hip/knee flexion- Right, decreased ankle dorsiflexion- Right, circumduction- Right, genu recurvatum- Right, and wide BOS Distance walked: 300' Assistive device utilized: Single point cane Level of assistance: SBA Comments: Pt unable to relax high guard posture when cued.  Cues and demonstration provided for improved heel strike on RLE w/ pt only able to place foot flat with increased force and concentration.  Does well w/ turning left and right requiring no cues for safety.  GAIT: Gait pattern: step through pattern, decreased hip/knee flexion- Right, decreased ankle dorsiflexion- Right, genu recurvatum- Right, decreased trunk rotation, and wide BOS Distance walked: 300' Assistive device utilized: None Level of assistance: SBA Comments: Cued for relaxed arms and trunk rotation engagement as pt  has mildly flexed arms using swing to compensate and propel trunk forward.  Initiated HEP:  see below for details.  PATIENT EDUCATION: Education details: Initial HEP, safety if performing stairs using rails, and outcome interpretation. Person educated: Patient and niece [name] Education method: Medical illustrator Education comprehension: verbalized understanding  HOME EXERCISE PROGRAM: Access Code: [email or web address] URL: [email or web address] Date: 07/25/2022 Prepared by: Camille Bal  Exercises - Staggered Sit-to-Stand  - 1 x daily - 4-5 x weekly - 2-3 sets - 10 reps - Standing Tandem Balance with Counter Support  - 1 x daily - 5 x weekly - 1 sets - 2-3 reps - 30-45 seconds hold - Tandem Walking with Counter Support  - 1 x daily - 5-6 x weekly - 3 sets - 10  reps  GOALS: Goals reviewed with patient? Yes  SHORT TERM GOALS=LONG-TERM GOALS due to length of POC  LONG TERM GOALS: Target date: 08/17/2022  Pt will be independent with final HEP for improved strength, balance, transfers and gait. Baseline:  Goal status: INITIAL  2.  Pt will demonstrate TUG of </=13 seconds in order to decrease risk of falls and improve functional mobility using LRAD. Baseline: 14.35 sec w/ SPC and R AFO Goal status: INITIAL  3.  Pt will improve FGA score to >/=20/30 in order to demonstrate improved balance and decreased fall risk. Baseline: 14/30 no AD Goal status: INITIAL  4.  Pt will demonstrate a gait speed of >/=3.0 feet/sec using LRAD and AFO in order to decrease risk for falls. Baseline: 2.65 ft/sec w/ SPC and R AFO Goal status: INITIAL   ASSESSMENT:  CLINICAL IMPRESSION: Assessed TUG, FGA, and gait speed this session with goals set as appropriate.  Based on scores and times of all assessments pt is at an increased risk for falls.  He does well ambulating w/ his right posterior Thusane AFO this visit demonstrating good foot clearance, but remaining limited by knee  hyperextension.  HEP initiated for functional strength and balance.  He would do well to work on further strengthening and NMR of the RLE.   OBJECTIVE IMPAIRMENTS: Abnormal gait, decreased balance, decreased coordination, decreased endurance, decreased mobility, and decreased strength.   ACTIVITY LIMITATIONS: carrying, lifting, bending, squatting, stairs, and transfers  PARTICIPATION LIMITATIONS: community activity  PERSONAL FACTORS: Age and 1-2 comorbidities:    hypertension, hyperlipidemia, diabetes mellitus, CKD stage III, CHF with ejection fraction of 25 to 30%, medical noncompliance, recent CVA left hemispheric subcortical region are also affecting patient's functional outcome.   REHAB POTENTIAL: Good  CLINICAL DECISION MAKING: Stable/uncomplicated  EVALUATION COMPLEXITY: Low  PLAN:  PT FREQUENCY: 2x/week  PT DURATION: 6 weeks (9 visits)  PLANNED INTERVENTIONS: Therapeutic exercises, Therapeutic activity, Neuromuscular re-education, Balance training, Gait training, Patient/Family education, Self Care, Joint mobilization, Stair training, Vestibular training, Canalith repositioning, Visual/preceptual remediation/compensation, Orthotic/Fit training, DME instructions, Aquatic Therapy, Dry Needling, Electrical stimulation, Cryotherapy, Moist heat, Taping, Manual therapy, and Re-evaluation  PLAN FOR NEXT SESSION: check L BP!, modify HEP for strength/balance prn, continue gait with use of R Thusane? AFO, RLE NMR   Sadie Haber, PT, DPT 07/25/2022, 5:02 PM

## 2022-07-25 NOTE — Therapy (Signed)
OUTPATIENT OCCUPATIONAL THERAPY TREATMENT NOTE  Patient Name: Joseph Patrick MRN: [id number] DOB:Apr 01, 1946, 76 y.o., male Today's Date: 07/26/2022  PCP: Arthur Holms, NP REFERRING PROVIDER: Leandrew Koyanagi, MD     OT End of Session - 07/26/22 [phone]     Visit Number 9    Number of Visits 24    Date for OT Re-Evaluation 09/07/22    Authorization Type [employer]    Progress Note Due on Visit 18    OT Start Time [phone]    OT Stop Time 1012    OT Time Calculation (min) 44 min    Activity Tolerance Patient tolerated treatment well;Patient limited by fatigue;Patient limited by pain    Behavior During Therapy [facility] for tasks assessed/performed               Past Medical History:  Diagnosis Date   CVA (cerebral vascular accident) [facility])    april 2023   Diabetes mellitus without complication ([place])    Hypertension    History reviewed. No pertinent surgical history. 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 ([place]) 01/14/2022   HFrEF (heart failure with reduced ejection fraction) ([place])    Essential hypertension 01/07/2022   Hyperlipidemia 01/07/2022   Diabetes ([place]) 01/07/2022   Stage 3a chronic kidney disease (CKD) ([place]) 01/07/2022   Transient cerebral ischemia 01/06/2022    ONSET DATE:  July 16th, 2023 (about 12 weeks since injury)   REFERRING DIAG: M25.531 (ICD-10-CM) - Pain in right wrist  THERAPY DIAG:  Muscle weakness (generalized)  Other lack of coordination  Chronic right shoulder pain  Stiffness of right wrist, not elsewhere classified  Acute ischemic left MCA stroke (HCC)  Localized edema  Rationale for Evaluation and Treatment Rehabilitation  PERTINENT HISTORY: Per medical notes, he fell ~ 2 months ago, tearing (complete supraspinatus) Rt RTC, tore Rt TFCC as well as S-L ligament.  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 He states falling ~2 months ago and wrist bend into flexion and he tore ligaments in his wrist. He states his right hand is now swollen and stiff. He also states having a stroke this April (about 6 months ago) that affected Rt side, but he states rehabbing back to "normal" before fall. He states no significant pain at fall and none now- just swollen, tight, stiff and also poorer coordination now. He states not wearing any wrist brace after fall, tried a sling for his shoulder, which he felt "made him worse," so d/c'd it.   His niece is with him today to support him."  PRECAUTIONS: Fall and Other: Rt RTC tears   WEIGHT BEARING RESTRICTIONS Yes caution for Rt Shoulder and wrist   SUBJECTIVE:   SUBJECTIVE STATEMENT: He states doing well, but feeling tired lately, doesn't always feel like doing exercises. He does try to do supine AAROM/stretches first thing in bed in the morning.     PAIN:  Are you having pain?  No  Rating: 0/10 at rest, up to 0/10 in past week at worst and states not actively avoiding use thereof  FALLS: Has patient fallen in last 6 months? Yes. Number of falls 1 - this injury  LIVING ENVIRONMENT: Lives with: Alone. He states driving since stroke. He uses a cane for stability.    OBJECTIVE: (All objective assessments below are from initial  evaluation on: 06/28/22 unless otherwise specified.)    HAND DOMINANCE: Right   ADLs: Overall ADLs: He states problems with dressing, bathing, turning door handles, and FMS buttoning with Rt hand now.   FUNCTIONAL OUTCOME MEASURES: 07/24/22: PRWE: Pain: 3/50; Function: 18.5/40; Total Score: 21.5/90 (Higher Score  =  More Pain and/or Debility)   Eval: Patient Rated Wrist Evaluation (PRWE) (modified to exclude work and recreational activities as he states not doing at baseline): Pain: 0/50; Function: 19.5/40; Total Score: 19.5/90 (Higher Score  =  More Pain and/or Debility)    UPPER  EXTREMITY ROM     Shoulder to Wrist AROM Right eval Left eval Rt 07/10/22 Rt 07/24/22  Forearm supination 63   56  Forearm pronation  80   83  Wrist flexion 37* (tender) (40* PROM)  50* 47 35  Wrist extension 28* (40* PROM)  68* 36 42  Wrist ulnar deviation 7*  19 25  Wrist radial deviation 5*  14 15  Functional dart thrower's motion (F-DTM) in ulnar flexion 25*     F-DTM in radial extension  27*     (Blank rows = not tested)   Hand AROM Right eval Right 07/24/22  Full Fist Ability (or Gap to Distal Palmar Crease) Loose fist (fingers barely touch palm Still can touch palm with all fingers with effort  Thumb Opposition to Small Finger (or Gap) 1.5cm gap and slow motion ~1.5 - 2cm gap to oppose  Thumb Opposition to Base of Small Finger (or Gap)  unable unable  (Blank rows = not tested)   UPPER EXTREMITY MMT:    Eval:  NT at eval due to concern for ligament tears, however he was stable and non-painful and this will be tested next session to help "tease out" CVA type symptoms as well. Definite weakness Rt vs Lt.   MMT Right 07/03/22 Rt 07/24/22  Elbow flexion 5/5 good tone, poor endurance 4+/5 MMT  Elbow extension 4-/5 lower tone 4+/5 MMT  Forearm supination 4+/5 good tone 4+/5 MMT  Forearm pronation 4+/5 good tone 5/5 MMY  Wrist flexion 4+/5 4+/5 MMT  Wrist/finger extension 3+/5 4-/5 MMT  (Blank rows = not tested)  HAND FUNCTION: 07/24/22: Rt grip today: 25.7#  07/03/22: Rt 19# grip (no pain); Lt: 118#    COORDINATION: 07/24/22: Box & Blocks Test Right 33 Blocks Today (46 is WFL)  9HPT: 9HPT: Rt 10mn 7sec   07/03/22: Box & Blocks Test Right 21 Blocks Today (46 is WFL)   Eval: He has observed ataxic motion with finger to nose test, observed slow and poor FMS in Rt hand compared to Lt.  9HPT: Rt 155m 46sec with several pegs dropped.   SENSATION: Eval:  Light touch intact today, he states equal b/l   EDEMA:   07/03/22: Rt hand figure of 8:   48.7cm today  (Lt hand is  48.8cm);  Rt IF base 7.5cm (Lt hand is 7.2cm)   COGNITION: 07/24/22: more overt memory issues since start of care, he can't recall doing box and blocks before, he forgets mentioning lateral shoulder pain, seems to discuss the same things over during each session, etc.   Eval: [facility], though states some memory issues  OBSERVATIONS:   Eval: no facial droop, but Rt sided ataxia present in UE and LE, poor tone and coordination noted as well.  Concern for exacerbation of CVA as main issue vs ligament tears and orthopedic issues.  (Rt wrist seems stable, negative  Watson's Test, no DRUJ instability, negative TFCC shear test)   TODAY'S TREATMENT:  07/26/22: He uses UBE for 6 mins on 2.0 resistance for increase endurance, then sits side of plinth to do large amplitude repetitive reaching, cross body, over head, to sides, etc with mirror feedback. He also reviews wrist stretches, AAROM with wand in mirror for shoulders and biceps, and wrists. And he finishes session with FMS and writing activities. He has some mild discomfort in shoulder and rt wrist throughout which is typical for him, but no c/o at end.    07/24/22: He performs functional activities for fine motor skills gross motor coordination in standing and sitting today.  He also performs active range of motion gripping and other assessments for progress note today.  Strength is improved.  Thumb motion is tighter today but this could be due to swelling temporarily increased today.  He reviews all of his homework exercises as below and recommendations for management of shoulder pain wrist tightness hand coordination and stroke recovery. During standing fnl activity, he states getting symptoms of a "tension headache" and OT takes BP at 141/83 with 76 pulse rate.  The symptoms resolved.  He is told to continue to monitor them.  At the end he has no questions and neither does his niece who is also present as usual.   He states no questions or comments about his HEP but he does admit to not remembering them.  He has some overt memory problems.  Exercises - Seated Shoulder External Rotation PROM on Table  - 3-4 x daily - 3-5 reps - 15-20 sec hold - Sleeper Stretch  - 3-4 x daily - 5 reps - 15-20 sec hold - Seated Single Arm Shoulder PNF D1 Flexion  - 4-6 x daily - 10-15 reps - Wrist Flexion Stretch  - 4 x daily - 3-5 reps - 15 sec hold - Wrist Extension Stretch Pronated  - 4 x daily - 3-5 reps - 15 hold - Bend and Pull Back Wrist SLOWLY  - 4 x daily - 10-15 reps - Tendon Glides  - 4-6 x daily - 3-5 reps - 2-3 seconds hold - Thumb Opposition  - 4-6 x daily - 10 reps - Seated Single Finger Extension  - 4-6 x daily - 10-15 reps - Full Fist  - 2-3 x daily - 5 reps - Finger Extension "Pizza!"   - 2-3 x daily - 5 reps - Finger Pinch and Pull with Putty  - 2-3 x daily - 5 reps - Wrist Extension with Resistance  - 2-4 x daily - 1-2 sets - 10-15 reps - Wrist Flexion with Resistance  - 2-4 x daily - 1-2 sets - 10-15 reps   PATIENT EDUCATION: Education details: See tx section above for details  Person educated: Patient Education method: Veterinary surgeon, Teach back, Handouts  Education comprehension: States and demonstrates understanding, Additional Education required    HOME EXERCISE PROGRAM: Access Code: [email or web address] URL: [email or web address]    GOALS: Goals reviewed with patient? Yes   SHORT TERM GOALS: (STG required if POC>30 days) Target Date: 07/13/22  Pt will obtain protective, custom orthotic. Goal status: D/C he doesn't need at this point   2.  Pt will demo/state understanding of initial HEP to improve pain levels and prerequisite motion. Goal status: 07/24/22: MET but he cannot recall most today    LONG TERM GOALS: Target Date: 09/07/22  Pt will improve functional ability by decreased impairment per PRWE functional assessment from 19.5/40 to 10/40  or better, for better  quality of life. Goal status: 07/24/22: 18.5 today, slightly better, admits to favoring Lt hand and not using right hand at times.   2.  Pt will improve grip strength in Rt hand to at least 40lbs for functional use at home and in IADLs. Goal status:  07/24/22: 25# today  3.  Pt will improve A/ROM in Rt wrist flex & ext from 37/28* respectively, to at least 40* both, to have functional motion for tasks like reach and grasp.  Goal status:  07/24/22: now 35* / 42* respectively   4.  Pt will improve strength in Rt wrist flex, ext to at least 4+/5 MMT to have increased functional ability to carry out selfcare and higher-level homecare tasks with no difficulty. Goal status: 07/24/22: now 4+/5 flexion, but 4-/5 ext  5.  Pt will improve coordination skills in Rt hand, as seen by better score on 9HPT testing to at least 55sec to have increased functional ability to carry out fine motor tasks (fasteners, etc.) and more complex, coordinated IADLs (meal prep, sports, etc.).  Goal status: 07/24/22: today 67 sec     ASSESSMENT:  CLINICAL IMPRESSION: 07/26/22: He is doing well, but memory issues recalling exercises and he does tend to exercise "DIY" style at home vs follow printed therapy recommendations.  His niece remains very helpful    07/24/22: He was reassessed to determine status and needs.  He has been making steady progress towards all goals but has not completed them yet.  He is going to start getting PT and OT nurse treatments at the neurological center.  OT will send note to physician to extend certification today based on medical necessity for continued occupational therapy.    PLAN: OT FREQUENCY: 2x/week (12 more visits)   OT DURATION: 6 additional weeks (through 09/07/22 as needed)   PLANNED INTERVENTIONS: self care/ADL training, therapeutic exercise, therapeutic activity, neuromuscular re-education, manual therapy, passive range of motion, functional mobility training, splinting, electrical  stimulation, ultrasound, fluidotherapy, compression bandaging, moist heat, cryotherapy, contrast bath, patient/family education, and coping strategies training  RECOMMENDED OTHER SERVICES: is getting PT for shoulder tears right now. PT was recommended to assess and tx for LE weakness/stroke signs as well. He was also recommended to see cardiologist/neurologist about possible stroke evolution.   CONSULTED AND AGREED WITH PLAN OF CARE: Patient and family member/caregiver  PLAN FOR NEXT SESSION:  He will take 1-2 week break from OT until he can be seen at [facility] neuro clinic for both PT and OT. He just had progress visit 27/6/14, and is certified through 70/92/95, [place].   Benito Mccreedy, OTR/L, CHT 07/26/2022, 10:18 AM

## 2022-07-26 ENCOUNTER — Encounter: Payer: Self-pay | Admitting: Rehabilitative and Restorative Service Providers"

## 2022-07-26 ENCOUNTER — Ambulatory Visit: Payer: Medicare HMO | Admitting: Rehabilitative and Restorative Service Providers"

## 2022-07-26 DIAGNOSIS — M25511 Pain in right shoulder: Secondary | ICD-10-CM

## 2022-07-26 DIAGNOSIS — M25631 Stiffness of right wrist, not elsewhere classified: Secondary | ICD-10-CM | POA: Diagnosis not present

## 2022-07-26 DIAGNOSIS — G8929 Other chronic pain: Secondary | ICD-10-CM

## 2022-07-26 DIAGNOSIS — M6281 Muscle weakness (generalized): Secondary | ICD-10-CM | POA: Diagnosis not present

## 2022-07-26 DIAGNOSIS — I63512 Cerebral infarction due to unspecified occlusion or stenosis of left middle cerebral artery: Secondary | ICD-10-CM

## 2022-07-26 DIAGNOSIS — R600 Localized edema: Secondary | ICD-10-CM

## 2022-07-26 DIAGNOSIS — R278 Other lack of coordination: Secondary | ICD-10-CM

## 2022-07-27 ENCOUNTER — Ambulatory Visit: Payer: Medicare HMO | Admitting: Physical Therapy

## 2022-07-27 ENCOUNTER — Telehealth (HOSPITAL_COMMUNITY): Payer: Self-pay | Admitting: Emergency Medicine

## 2022-07-27 VITALS — BP 156/73

## 2022-07-27 DIAGNOSIS — I69351 Hemiplegia and hemiparesis following cerebral infarction affecting right dominant side: Secondary | ICD-10-CM | POA: Diagnosis not present

## 2022-07-27 DIAGNOSIS — M6281 Muscle weakness (generalized): Secondary | ICD-10-CM

## 2022-07-27 DIAGNOSIS — R2681 Unsteadiness on feet: Secondary | ICD-10-CM

## 2022-07-27 DIAGNOSIS — R278 Other lack of coordination: Secondary | ICD-10-CM

## 2022-07-27 NOTE — Telephone Encounter (Signed)
Reaching out to patient to offer assistance regarding upcoming cardiac imaging study; pt verbalizes understanding of appt date/time, parking situation and where to check in, pre-test NPO status and medications ordered, and verified current allergies; name and call back number provided for further questions should they arise Rockwell Alexandria RN Navigator Cardiac Imaging [facility] [phone] office [phone] cell  Arrival [address] Holding carvedilol, imdur NPO 3 hr prior No caffeine Denies iv issues Shoulder mobility issue

## 2022-07-27 NOTE — Therapy (Signed)
OUTPATIENT PHYSICAL THERAPY NEURO TREATMENT   Patient Name: Joseph Patrick MRN: [id number] DOB:[birth year], 76 y.o., male Today's Date: 07/27/2022   PCP: Loura Back NP REFERRING PROVIDER: Micki Riley, MD   PT End of Session - 07/27/22 0914     Visit Number 3    Number of Visits 9   with eval   Date for PT Re-Evaluation 08/31/22   to allow for scheduling conflicts   Authorization Type [employer]    PT Start Time 0913    PT Stop Time 1000    PT Time Calculation (min) 47 min    Equipment Utilized During Treatment Gait belt    Activity Tolerance Patient tolerated treatment well    Behavior During Therapy [facility] for tasks assessed/performed              Past Medical History:  Diagnosis Date   CVA (cerebral vascular accident) [facility])    april 2023   Diabetes mellitus without complication (HCC)    Hypertension    No past surgical history on file. 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) 01/14/2022   HFrEF (heart failure with reduced ejection fraction) (HCC)    Essential hypertension 01/07/2022   Hyperlipidemia 01/07/2022   Diabetes (HCC) 01/07/2022   Stage 3a chronic kidney disease (CKD) (HCC) 01/07/2022   Transient cerebral ischemia 01/06/2022    ONSET DATE: 07/12/2022  REFERRING DIAG: O27.035 (ICD-10-CM) - Spastic hemiplegia of right dominant side as late effect of cerebral infarction (HCC) I63.81 (ICD-10-CM) - Lacunar stroke (HCC)  THERAPY DIAG:  Muscle weakness (generalized)  Unsteadiness on feet  Other lack of coordination  Rationale for Evaluation and Treatment: Rehabilitation  SUBJECTIVE:                                                                                                                                                                                             SUBJECTIVE STATEMENT: Pt reports he felt a  little tired after last PT session. Pt reports that he hasn't been able to work on his HEP due to being busy, had OT yesterday. No pain and no falls. Pt reports his BP was 116/63 at home yesterday, unable to get a reading this morning.  Pt accompanied by: family member (niece Joseph Land)  PERTINENT HISTORY: hypertension, hyperlipidemia, diabetes mellitus, CKD stage III, CHF with ejection fraction of 25 to 30%, medical noncompliance, recent CVA left hemispheric subcortical  PAIN:  Are you having pain? No  PRECAUTIONS: Fall  WEIGHT BEARING RESTRICTIONS: No  FALLS: Has patient fallen in last 6 months? Yes. Number of falls 1 in August that led to R shoulder injury (tear in rotator cuff muscles)  LIVING ENVIRONMENT: Lives with: lives alone Lives in: House/apartment Stairs: No (can use elevator) Has following equipment at home: Single point cane  PLOF: Independent with gait, Independent with transfers, and Requires assistive device for independence  PATIENT GOALS: "strengthen my right leg and my right arm" "I want to be able to swing a golf club with RUE"  OBJECTIVE:    Vitals:   07/27/22 0917  BP: (!) 156/73     THER EX:  Standing mini-squats x 10 reps no UE support  Standing mini-squats on airex 2 x 10 reps no UE support  -focus on equal WB through BLE   In // bars with RTB:   -Hip flexion x 10 reps B   -Hip abd x 10 reps B -attempted hip ext but pt has difficulty performing exercise correctly   Supine bridges  -added in pillow between LE  -tried single leg bridge with LLE only   Added to HEP, see bolded below   NMR: At bottom of stairs with BUE support: 6" step-taps with RLE 2 x 10 reps 12" step-taps with RLE x 10 reps focus on decreased circumduction of R hip and decreased dragging of R foot off the step  PATIENT EDUCATION: Education details: added to HEP Person educated: Patient and niece Joseph Land Education method: Explanation, Demonstration, and Handouts Education  comprehension: verbalized understanding  HOME EXERCISE PROGRAM: Access Code: [email or web address] URL: [email or web address] Date: 07/25/2022 Prepared by: Camille Bal  Exercises - Staggered Sit-to-Stand  - 1 x daily - 4-5 x weekly - 2-3 sets - 10 reps - Standing Tandem Balance with Counter Support  - 1 x daily - 5 x weekly - 1 sets - 2-3 reps - 30-45 seconds hold - Tandem Walking with Counter Support  - 1 x daily - 5-6 x weekly - 3 sets - 10 reps - Mini Squat with Counter Support  - 1 x daily - 7 x weekly - 3 sets - 10 reps - Standing 3-Way Leg Reach with Resistance at Ankles and Counter Support  - 1 x daily - 7 x weekly - 3 sets - 10 reps - Supine Bridge with Mini Swiss Ball Between Knees  - 1 x daily - 7 x weekly - 3 sets - 10 reps   GOALS: Goals reviewed with patient? Yes  SHORT TERM GOALS=LONG-TERM GOALS due to length of POC  LONG TERM GOALS: Target date: 08/17/2022  Pt will be independent with final HEP for improved strength, balance, transfers and gait. Baseline:  Goal status: INITIAL  2.  Pt will demonstrate TUG of </=13 seconds in order to decrease risk of falls and improve functional mobility using LRAD. Baseline: 14.35 sec w/ SPC and R AFO Goal status: INITIAL  3.  Pt will improve FGA score to >/=20/30 in order to demonstrate improved balance and decreased fall risk. Baseline: 14/30 no AD Goal status: INITIAL  4.  Pt will demonstrate a gait speed of >/=3.0 feet/sec using LRAD and AFO in order to decrease risk for falls. Baseline: 2.65 ft/sec w/ SPC and R AFO Goal status: INITIAL   ASSESSMENT:  CLINICAL IMPRESSION: Emphasis of skilled PT session on working on RLE NMR and strengthening as well as adding to HEP. Pt continues to exhibit decreased RLE strength and control as well as gait impairments including circumduction of R hip and decreased R knee  flexion. Pt needs min cueing to perform exercises correctly and in a slow, controlled manner. Pt continues to  benefit from skilled therapy services to address deficits from his stroke. Continue POC.    OBJECTIVE IMPAIRMENTS: Abnormal gait, decreased balance, decreased coordination, decreased endurance, decreased mobility, and decreased strength.   ACTIVITY LIMITATIONS: carrying, lifting, bending, squatting, stairs, and transfers  PARTICIPATION LIMITATIONS: community activity  PERSONAL FACTORS: Age and 1-2 comorbidities:    hypertension, hyperlipidemia, diabetes mellitus, CKD stage III, CHF with ejection fraction of 25 to 30%, medical noncompliance, recent CVA left hemispheric subcortical region are also affecting patient's functional outcome.   REHAB POTENTIAL: Good  CLINICAL DECISION MAKING: Stable/uncomplicated  EVALUATION COMPLEXITY: Low  PLAN:  PT FREQUENCY: 2x/week  PT DURATION: 6 weeks (9 visits)  PLANNED INTERVENTIONS: Therapeutic exercises, Therapeutic activity, Neuromuscular re-education, Balance training, Gait training, Patient/Family education, Self Care, Joint mobilization, Stair training, Vestibular training, Canalith repositioning, Visual/preceptual remediation/compensation, Orthotic/Fit training, DME instructions, Aquatic Therapy, Dry Needling, Electrical stimulation, Cryotherapy, Moist heat, Taping, Manual therapy, and Re-evaluation  PLAN FOR NEXT SESSION: check L BP!, modify HEP for strength/balance prn, continue gait with use of R Thusane? AFO, RLE NMR   Peter Congo, PT, DPT, CSRS 07/27/2022, 10:06 AM

## 2022-07-31 ENCOUNTER — Encounter (HOSPITAL_COMMUNITY)
Admission: RE | Admit: 2022-07-31 | Discharge: 2022-07-31 | Disposition: A | Discharge status: Home / Self Care | Payer: Medicare HMO | Source: Ambulatory Visit | Attending: Cardiovascular Disease | Admitting: Cardiovascular Disease

## 2022-07-31 ENCOUNTER — Encounter: Payer: Self-pay | Admitting: Physical Therapy

## 2022-07-31 ENCOUNTER — Encounter: Payer: Self-pay | Admitting: Orthopaedic Surgery

## 2022-07-31 ENCOUNTER — Ambulatory Visit: Payer: Medicare HMO | Admitting: Physical Therapy

## 2022-07-31 ENCOUNTER — Encounter (HOSPITAL_COMMUNITY): Payer: Self-pay

## 2022-07-31 VITALS — BP 167/71 | HR 70

## 2022-07-31 DIAGNOSIS — R278 Other lack of coordination: Secondary | ICD-10-CM

## 2022-07-31 DIAGNOSIS — R2681 Unsteadiness on feet: Secondary | ICD-10-CM

## 2022-07-31 DIAGNOSIS — I69351 Hemiplegia and hemiparesis following cerebral infarction affecting right dominant side: Secondary | ICD-10-CM

## 2022-07-31 DIAGNOSIS — R072 Precordial pain: Secondary | ICD-10-CM | POA: Diagnosis present

## 2022-07-31 DIAGNOSIS — M6281 Muscle weakness (generalized): Secondary | ICD-10-CM

## 2022-07-31 LAB — NM PET CT CARDIAC PERFUSION MULTI W/ABSOLUTE BLOODFLOW
MBFR: 1.58
Nuc Rest EF: 39 %
Nuc Stress EF: 56 %
Rest MBF: 1.03 ml/g/min
ST Depression (mm): 0 mm
Stress MBF: 1.63 ml/g/min
TID: 0.96

## 2022-07-31 MED ORDER — RUBIDIUM RB82 GENERATOR (RUBYFILL)
20.4000 | PACK | Freq: Once | INTRAVENOUS | Status: AC
  Administered 2022-07-31: 20.4 via INTRAVENOUS

## 2022-07-31 MED ORDER — REGADENOSON 0.4 MG/5ML IV SOLN: 0.4000 mg | Freq: Once | INTRAVENOUS | Status: AC

## 2022-07-31 MED ORDER — REGADENOSON 0.4 MG/5ML IV SOLN
INTRAVENOUS | Status: AC
  Administered 2022-07-31: 0.4 mg via INTRAVENOUS
  Filled 2022-07-31: qty 5

## 2022-07-31 NOTE — Therapy (Signed)
OUTPATIENT PHYSICAL THERAPY NEURO TREATMENT   Patient Name: Joseph Patrick MRN: [id number] DOB:Sep 02, 1946, 76 y.o., male Today's Date: 07/31/2022   PCP: Loura Back NP REFERRING PROVIDER: Micki Riley, MD   PT End of Session - 07/31/22 1624     Visit Number 4    Number of Visits 9   with eval   Date for PT Re-Evaluation 08/31/22   to allow for scheduling conflicts   Authorization Type [employer]    PT Start Time 1618    PT Stop Time 1700    PT Time Calculation (min) 42 min    Equipment Utilized During Treatment Gait belt    Activity Tolerance Patient tolerated treatment well    Behavior During Therapy [facility] for tasks assessed/performed              Past Medical History:  Diagnosis Date   CVA (cerebral vascular accident) [facility])    april 2023   Diabetes mellitus without complication (HCC)    Hypertension    History reviewed. No pertinent surgical history. 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) 01/14/2022   HFrEF (heart failure with reduced ejection fraction) (HCC)    Essential hypertension 01/07/2022   Hyperlipidemia 01/07/2022   Diabetes (HCC) 01/07/2022   Stage 3a chronic kidney disease (CKD) (HCC) 01/07/2022   Transient cerebral ischemia 01/06/2022    ONSET DATE: 07/12/2022  REFERRING DIAG: H82.993 (ICD-10-CM) - Spastic hemiplegia of right dominant side as late effect of cerebral infarction (HCC) I63.81 (ICD-10-CM) - Lacunar stroke (HCC)  THERAPY DIAG:  Muscle weakness (generalized)  Unsteadiness on feet  Other lack of coordination  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  He went to the grocery store and walked around.  He feels a little tired this afternoon.  He denies falls or acute changes.  Pt accompanied by: family member (niece [name])  PERTINENT HISTORY: hypertension, hyperlipidemia, diabetes mellitus, CKD stage III, CHF with ejection fraction of 25 to 30%, medical noncompliance, recent CVA left hemispheric subcortical  PAIN:  Are you having pain? No  PRECAUTIONS: Fall  WEIGHT BEARING RESTRICTIONS: No  FALLS: Has patient  fallen in last 6 months? Yes. Number of falls 1 in August that led to R shoulder injury (tear in rotator cuff muscles)  LIVING ENVIRONMENT: Lives with: lives alone Lives in: House/apartment Stairs: No (can use elevator) Has following equipment at home: Single point cane  PLOF: Independent with gait, Independent with transfers, and Requires assistive device for independence  PATIENT GOALS: "strengthen my right leg and my right arm" "I want to be able to swing a golf club with RUE"  OBJECTIVE:    Vitals:   07/31/22 1621  BP: (!) 167/71  Pulse: 70   -Pt ambulates x100' to warmup prior to 150' w/ left and right head turns in increasing ROM w/o LOB -Dual task w/ pt tracking vertical ball toss during 250' ambulation, w/ fatigue pt begins to ER foot and intermittently catch foot on ground -Extensive discussion of right shoulder injury and remaining and increasing crepitus, pt and niece concerns and following-up with orthopedic MD as needed. -side stepping w/ ball toss to wall 4x20' high and low for dual task challenge, pt has several LOB using good stepping strategy to recover w/ CGA -4" hurdles focused on progressing to no UE support w/o circumduction pattern 4x8' in // bars -Backwards walking 4x25' cued for glut and hamstring engagement to prevent trunk momentum compensation over RLE -Discussed ER posture of  foot as it relates to hip, shoes, and AFO positioning.  PATIENT EDUCATION: Education details: Continue HEP. Person educated: Patient and niece [name] Education method: Explanation, Demonstration, and Handouts Education comprehension: verbalized understanding  HOME EXERCISE PROGRAM: Access Code: [email or web address] URL: [email or web address] Date: 07/25/2022 Prepared by: Camille Bal  Exercises - Staggered Sit-to-Stand  - 1 x daily - 4-5 x weekly - 2-3 sets - 10 reps - Standing Tandem Balance with Counter Support  - 1 x daily - 5 x weekly - 1 sets - 2-3 reps - 30-45 seconds hold - Tandem Walking with Counter Support  - 1 x daily - 5-6 x weekly - 3 sets - 10 reps - Mini Squat with Counter Support  - 1 x daily - 7 x weekly - 3 sets - 10 reps - Standing 3-Way Leg Reach with Resistance at Ankles and Counter Support  - 1 x daily - 7 x weekly - 3 sets - 10 reps - Supine Bridge with Mini Swiss Ball Between Knees  - 1 x daily - 7 x weekly - 3 sets - 10 reps   GOALS: Goals reviewed with patient? Yes  SHORT TERM GOALS=LONG-TERM GOALS due to length of POC  LONG TERM GOALS: Target date: 08/17/2022  Pt will be independent with final HEP for improved strength, balance, transfers and gait. Baseline:  Goal status: INITIAL  2.  Pt will demonstrate TUG of </=13 seconds in order to decrease risk of falls and improve functional mobility using LRAD. Baseline: 14.35 sec w/ SPC and R AFO Goal status: INITIAL  3.  Pt will improve FGA score to >/=20/30 in order to demonstrate improved balance and decreased fall risk. Baseline: 14/30 no AD Goal status: INITIAL  4.  Pt will demonstrate a gait speed of >/=3.0 feet/sec using LRAD and AFO in order to decrease risk for falls. Baseline: 2.65 ft/sec w/ SPC and R AFO Goal status: INITIAL   ASSESSMENT:  CLINICAL IMPRESSION: Emphasis of skilled PT session on introduction of dual task activity to challenge dynamic balance and gait mechanics.  Pt  continues to compensate with circumduction pattern when approaching obstacles indicating ongoing hip weakness.  Will  continue to address deficits as outlined in POC.     OBJECTIVE IMPAIRMENTS: Abnormal gait, decreased balance, decreased coordination, decreased endurance, decreased mobility, and decreased strength.   ACTIVITY LIMITATIONS: carrying, lifting, bending, squatting, stairs, and transfers  PARTICIPATION LIMITATIONS: community activity  PERSONAL FACTORS: Age and 1-2 comorbidities:    hypertension, hyperlipidemia, diabetes mellitus, CKD stage III, CHF with ejection fraction of 25 to 30%, medical noncompliance, recent CVA left hemispheric subcortical region are also affecting patient's functional outcome.   REHAB POTENTIAL: Good  CLINICAL DECISION MAKING: Stable/uncomplicated  EVALUATION COMPLEXITY: Low  PLAN:  PT FREQUENCY: 2x/week  PT DURATION: 6 weeks (9 visits)  PLANNED INTERVENTIONS: Therapeutic exercises, Therapeutic activity, Neuromuscular re-education, Balance training, Gait training, Patient/Family education, Self Care, Joint mobilization, Stair training, Vestibular training, Canalith repositioning, Visual/preceptual remediation/compensation, Orthotic/Fit training, DME instructions, Aquatic Therapy, Dry Needling, Electrical stimulation, Cryotherapy, Moist heat, Taping, Manual therapy, and Re-evaluation  PLAN FOR NEXT SESSION: check L BP!, modify HEP for strength/balance prn, continue gait with use of R Thusane? AFO, RLE NMR, Hip IR/abd/extension strength   Sadie Haber, PT, DPT 07/31/2022, 5:17 PM

## 2022-08-02 ENCOUNTER — Ambulatory Visit: Payer: Medicare HMO | Admitting: Physical Therapy

## 2022-08-02 VITALS — BP 160/69 | HR 71

## 2022-08-02 DIAGNOSIS — R278 Other lack of coordination: Secondary | ICD-10-CM

## 2022-08-02 DIAGNOSIS — R2681 Unsteadiness on feet: Secondary | ICD-10-CM

## 2022-08-02 DIAGNOSIS — I69351 Hemiplegia and hemiparesis following cerebral infarction affecting right dominant side: Secondary | ICD-10-CM | POA: Diagnosis not present

## 2022-08-02 DIAGNOSIS — M6281 Muscle weakness (generalized): Secondary | ICD-10-CM

## 2022-08-02 NOTE — Therapy (Signed)
OUTPATIENT PHYSICAL THERAPY NEURO TREATMENT   Patient Name: Joseph Patrick MRN: [id number] DOB:12-16-1945, 76 y.o., male Today's Date: 08/02/2022   PCP: Loura Back NP REFERRING PROVIDER: Micki Riley, MD   PT End of Session - 08/02/22 [id number]     Visit Number 5    Number of Visits 9   with eval   Date for PT Re-Evaluation 08/31/22   to allow for scheduling conflicts   Authorization Type [employer]    PT Start Time 0848    PT Stop Time 0937    PT Time Calculation (min) 49 min    Equipment Utilized During Treatment Gait belt    Activity Tolerance Patient tolerated treatment well    Behavior During Therapy [facility] for tasks assessed/performed               Past Medical History:  Diagnosis Date   CVA (cerebral vascular accident) [facility])    april 2023   Diabetes mellitus without complication (HCC)    Hypertension    No past surgical history on file. 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) 01/14/2022   HFrEF (heart failure with reduced ejection fraction) (HCC)    Essential hypertension 01/07/2022   Hyperlipidemia 01/07/2022   Diabetes (HCC) 01/07/2022   Stage 3a chronic kidney disease (CKD) (HCC) 01/07/2022   Transient cerebral ischemia 01/06/2022    ONSET DATE: 07/12/2022  REFERRING DIAG: S97.026 (ICD-10-CM) - Spastic hemiplegia of right dominant side as late effect of cerebral infarction (HCC) I63.81 (ICD-10-CM) - Lacunar stroke (HCC)  THERAPY DIAG:  Muscle weakness (generalized)  Unsteadiness on feet  Other lack of coordination  Rationale for Evaluation and Treatment: Rehabilitation  SUBJECTIVE:                                                                                                                                                                                             SUBJECTIVE STATEMENT: Pt reports his  shoulder is feeling better, did not do many exercises yesterday because he was "exhausted". States his medication contributes to his fatigue. No falls.   Pt accompanied by: family member (niece Joseph Patrick)  PERTINENT HISTORY: hypertension, hyperlipidemia, diabetes mellitus, CKD stage III, CHF with ejection fraction of 25 to 30%, medical noncompliance, recent CVA left hemispheric subcortical  PAIN:  Are you having pain? No  VITALS Vitals:   08/02/22 0855  BP: (!) 160/69  Pulse: 71     PRECAUTIONS: Fall  WEIGHT BEARING RESTRICTIONS: No  FALLS: Has patient fallen in last  6 months? Yes. Number of falls 1 in August that led to R shoulder injury (tear in rotator cuff muscles)  LIVING ENVIRONMENT: Lives with: lives alone Lives in: House/apartment Stairs: No (can use elevator) Has following equipment at home: Single point cane  PLOF: Independent with gait, Independent with transfers, and Requires assistive device for independence  PATIENT GOALS: "strengthen my right leg and my right arm" "I want to be able to swing a golf club with RUE"  OBJECTIVE:   Ther Act Assessed pt's BP prior to session (see above) and systolic BP elevated but within limits to participate in therapy. Continued to encouraged pt to check BP at home.   Ther Ex  SciFit multi-peaks level 5 for 8 minutes using BUE/BLEs for neural priming for reciprocal movement, dynamic cardiovascular warmup and global strengthening. RPE of 2/10 following activity.   NMR  In // bars for improved R hip strengthening, lateral weight shifting and proper gait kinematics: Monster walks using green theraband tied around distal quads, down and back x6 w/intermittent UE support. Min verbal cues to maintain abduction of feet throughout and focus on slowing down to emphasize equal weightbearing. Pt much more challenged when walking to R side compared to L and compensated with very short step length and truncal lean.  Alt toe taps to 4" step  using mirror for visual biofeedback on body position, as pt demonstrates significant lean to L side when standing on RLE. Min tactile cues provided to pelvis to facilitate shift to RLE when in stance phase. Mod verbal cues to reduce circumduction compensation when tapping w/RLE, which pt able to correct 40% of time.  Practiced fwd gait in // bars without UE support w/emphasis of increased step length but slowed cadence to facilitate proper equal weightshift and assist w/swing phase of RLE to promote knee flexion w/TO. Encouraged pt to practice at home in long hallway to work on RLE strengthening and proper gait kinematics. Pt and niece verbalized understanding.    PATIENT EDUCATION: Education details: Continue HEP, practicing proper gait kinematics at home. Person educated: Patient and niece Joseph Patrick Education method: Explanation, Demonstration, and Handouts Education comprehension: verbalized understanding  HOME EXERCISE PROGRAM: Access Code: [email or web address] URL: [email or web address] Date: 07/25/2022 Prepared by: Camille Bal  Exercises - Staggered Sit-to-Stand  - 1 x daily - 4-5 x weekly - 2-3 sets - 10 reps - Standing Tandem Balance with Counter Support  - 1 x daily - 5 x weekly - 1 sets - 2-3 reps - 30-45 seconds hold - Tandem Walking with Counter Support  - 1 x daily - 5-6 x weekly - 3 sets - 10 reps - Mini Squat with Counter Support  - 1 x daily - 7 x weekly - 3 sets - 10 reps - Standing 3-Way Leg Reach with Resistance at Ankles and Counter Support  - 1 x daily - 7 x weekly - 3 sets - 10 reps - Supine Bridge with Mini Swiss Ball Between Knees  - 1 x daily - 7 x weekly - 3 sets - 10 reps   GOALS: Goals reviewed with patient? Yes  SHORT TERM GOALS=LONG-TERM GOALS due to length of POC  LONG TERM GOALS: Target date: 08/17/2022  Pt will be independent with final HEP for improved strength, balance, transfers and gait. Baseline:  Goal status: INITIAL  2.  Pt will  demonstrate TUG of </=13 seconds in order to decrease risk of falls and improve functional mobility using LRAD. Baseline: 14.35 sec w/ SPC and R AFO  Goal status: INITIAL  3.  Pt will improve FGA score to >/=20/30 in order to demonstrate improved balance and decreased fall risk. Baseline: 14/30 no AD Goal status: INITIAL  4.  Pt will demonstrate a gait speed of >/=3.0 feet/sec using LRAD and AFO in order to decrease risk for falls. Baseline: 2.65 ft/sec w/ SPC and R AFO Goal status: INITIAL   ASSESSMENT:  CLINICAL IMPRESSION: Emphasis of skilled PT session on RLE strength, lateral weight shifting and proper gait kinematics. Pt continues to demonstrate significant L truncal lean to compensate for RLE weakness in stance phase and circumduction for swing phase. Encouraged pt to slow down and focus on increasing step length to facilitate natural lateral weight shift, as pt currently demonstrates very short step length, making it difficult to clear RLE from ground without compensations. Continue POC.     OBJECTIVE IMPAIRMENTS: Abnormal gait, decreased balance, decreased coordination, decreased endurance, decreased mobility, and decreased strength.   ACTIVITY LIMITATIONS: carrying, lifting, bending, squatting, stairs, and transfers  PARTICIPATION LIMITATIONS: community activity  PERSONAL FACTORS: Age and 1-2 comorbidities:    hypertension, hyperlipidemia, diabetes mellitus, CKD stage III, CHF with ejection fraction of 25 to 30%, medical noncompliance, recent CVA left hemispheric subcortical region are also affecting patient's functional outcome.   REHAB POTENTIAL: Good  CLINICAL DECISION MAKING: Stable/uncomplicated  EVALUATION COMPLEXITY: Low  PLAN:  PT FREQUENCY: 2x/week  PT DURATION: 6 weeks (9 visits)  PLANNED INTERVENTIONS: Therapeutic exercises, Therapeutic activity, Neuromuscular re-education, Balance training, Gait training, Patient/Family education, Self Care, Joint  mobilization, Stair training, Vestibular training, Canalith repositioning, Visual/preceptual remediation/compensation, Orthotic/Fit training, DME instructions, Aquatic Therapy, Dry Needling, Electrical stimulation, Cryotherapy, Moist heat, Taping, Manual therapy, and Re-evaluation  PLAN FOR NEXT SESSION: check L BP!, modify HEP for strength/balance prn, continue gait with use of R Thusane? AFO, RLE NMR, Hip IR/abd/extension strength, lateral weight shifting, increased step length w/gait   Jill Alexanders Yamir Carignan, PT, DPT 08/02/2022, 9:38 AM

## 2022-08-07 NOTE — Therapy (Unsigned)
OUTPATIENT OCCUPATIONAL THERAPY TREATMENT NOTE  Patient Name: Joseph Patrick MRN: [id number] DOB:1945/12/31, 76 y.o., male Today's Date: 08/08/2022  PCP: Arthur Holms, NP REFERRING PROVIDER: Leandrew Koyanagi, MD     OT End of Session - 08/08/22 1535     Visit Number 10    Number of Visits 24    Date for OT Re-Evaluation 09/07/22    Authorization Type [employer]    Progress Note Due on Visit 8    OT Start Time 1147    OT Stop Time 1230    OT Time Calculation (min) 43 min    Activity Tolerance Patient tolerated treatment well;Patient limited by fatigue;Patient limited by pain    Behavior During Therapy [facility] for tasks assessed/performed              Past Medical History:  Diagnosis Date   CVA (cerebral vascular accident) [facility])    april 2023   Diabetes mellitus without complication ([place])    Hypertension    History reviewed. No pertinent surgical history. 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 ([place]) 01/14/2022   HFrEF (heart failure with reduced ejection fraction) ([place])    Essential hypertension 01/07/2022   Hyperlipidemia 01/07/2022   Diabetes ([place]) 01/07/2022   Stage 3a chronic kidney disease (CKD) ([place]) 01/07/2022   Transient cerebral ischemia 01/06/2022    ONSET DATE:  July 16th, 2023 (about 12 weeks since injury)   REFERRING DIAG: M25.531 (ICD-10-CM) - Pain in right wrist  THERAPY DIAG:  Muscle weakness (generalized)  Unsteadiness on feet  Other lack of coordination  Stiffness of right wrist, not elsewhere classified  Chronic right shoulder pain  Spastic hemiplegia of right dominant side as late effect of cerebral infarction (HCC)  Localized edema  Acute ischemic left MCA stroke ([place])  Lacunar stroke ([place])  Rationale for Evaluation and Treatment Rehabilitation  PERTINENT HISTORY: Per medical notes, he  fell ~ 2 months ago, tearing (complete supraspinatus) Rt RTC, tore Rt TFCC as well as S-L ligament.  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 He states falling ~2 months ago and wrist bend into flexion and he tore ligaments in his wrist. He states his right hand is now swollen and stiff. He also states having a stroke this April (about 6 months ago) that affected Rt side, but he states rehabbing back to "normal" before fall. He states no significant pain at fall and none now- just swollen, tight, stiff and also poorer coordination now. He states not wearing any wrist brace after fall, tried a sling for his shoulder, which he felt "made him worse," so d/c'd it.   His niece is with him today to support him."  PRECAUTIONS: Fall and Other: Rt RTC tears   WEIGHT BEARING RESTRICTIONS Yes caution for Rt Shoulder and wrist   SUBJECTIVE:   SUBJECTIVE STATEMENT: He states his hand has been swollen and stiff lately.    PAIN:  Are you having pain?  2/10 Rating: 0/10 at rest, up to 2/10 with activity in forearm  FALLS: Has patient fallen in last 6 months? Yes. Number of falls 1 - this injury  LIVING ENVIRONMENT: Lives with: Alone. He states driving since stroke. He uses a cane for stability.    OBJECTIVE: (All objective assessments below are from initial evaluation on: 06/28/22 unless otherwise specified.)  HAND DOMINANCE: Right   ADLs: Overall ADLs: He states problems with dressing, bathing, turning door handles, and FMS buttoning with Rt hand now.   FUNCTIONAL OUTCOME MEASURES: 07/24/22: PRWE: Pain: 3/50; Function: 18.5/40; Total Score: 21.5/90 (Higher Score  =  More Pain and/or Debility)   Eval: Patient Rated Wrist Evaluation (PRWE) (modified to exclude work and recreational activities as he states not doing at baseline): Pain: 0/50; Function: 19.5/40; Total Score: 19.5/90 (Higher Score  =  More Pain and/or Debility)    UPPER EXTREMITY ROM      Shoulder to Wrist AROM Right eval Left eval Rt 07/10/22 Rt 07/24/22  Forearm supination 63   56  Forearm pronation  80   83  Wrist flexion 37* (tender) (40* PROM)  50* 47 35  Wrist extension 28* (40* PROM)  68* 36 42  Wrist ulnar deviation 7*  19 25  Wrist radial deviation 5*  14 15  Functional dart thrower's motion (F-DTM) in ulnar flexion 25*     F-DTM in radial extension  27*     (Blank rows = not tested)   Hand AROM Right eval Right 07/24/22  Full Fist Ability (or Gap to Distal Palmar Crease) Loose fist (fingers barely touch palm Still can touch palm with all fingers with effort  Thumb Opposition to Small Finger (or Gap) 1.5cm gap and slow motion ~1.5 - 2cm gap to oppose  Thumb Opposition to Base of Small Finger (or Gap)  unable unable  (Blank rows = not tested)   UPPER EXTREMITY MMT:    Eval:  NT at eval due to concern for ligament tears, however he was stable and non-painful and this will be tested next session to help "tease out" CVA type symptoms as well. Definite weakness Rt vs Lt.   MMT Right 07/03/22 Rt 07/24/22  Elbow flexion 5/5 good tone, poor endurance 4+/5 MMT  Elbow extension 4-/5 lower tone 4+/5 MMT  Forearm supination 4+/5 good tone 4+/5 MMT  Forearm pronation 4+/5 good tone 5/5 MMY  Wrist flexion 4+/5 4+/5 MMT  Wrist/finger extension 3+/5 4-/5 MMT  (Blank rows = not tested)  HAND FUNCTION: 07/24/22: Rt grip today: 25.7#  07/03/22: Rt 19# grip (no pain); Lt: 118#    COORDINATION: 07/24/22: Box & Blocks Test Right 33 Blocks Today (46 is WFL)  9HPT: 9HPT: Rt 52mn 7sec   07/03/22: Box & Blocks Test Right 21 Blocks Today (46 is WFL)   Eval: He has observed ataxic motion with finger to nose test, observed slow and poor FMS in Rt hand compared to Lt.  9HPT: Rt 135m 46sec with several pegs dropped.   SENSATION: Eval:  Light touch intact today, he states equal b/l   EDEMA:   07/03/22: Rt hand figure of 8:   48.7cm today  (Lt hand is 48.8cm);  Rt IF  base 7.5cm (Lt hand is 7.2cm)   COGNITION: 07/24/22: more overt memory issues since start of care, he can't recall doing box and blocks before, he forgets mentioning lateral shoulder pain, seems to discuss the same things over during each session, etc.   Eval: [facility], though states some memory issues  OBSERVATIONS:   Eval: no facial droop, but Rt sided ataxia present in UE and LE, poor tone and coordination noted as well.  Concern for exacerbation of CVA as main issue vs ligament tears and orthopedic issues.  (Rt wrist seems stable, negative Watson's Test, no DRUJ instability, negative TFCC shear test)  TODAY'S TREATMENT:   - Therapeutic exercises completed for duration as noted below including:  OT reviewed RUE HEP as noted in pt instructions. He was instructed to complete these in fluidotherapy machine. Handouts provided for reference.   Pt placed BUE in Fluidotherapy machine with supervised ROM x 10 min. Pt was educated to complete R PROM during modality time to improve ROM and decrease pain/stiffness of affected extremity by use of the machine's massaging action and thermal properties. Noted improvement to edema following.   Distraction of R wrist and digits all joints. Distraction to each joint provided x10 to promote movement and pain reduction of affected extremity.   PNF facilitation provided to R wrist to facilitate flexion, extension, radial deviation, and ulnar deviation of affected extremity.  Facilitation provided x10 each.   PNF facilitation provided to R digits all joints to facilitate flexion on affected extremity. Facilitation provided x10 each. Improved AROM noted with completion.   - Ultrasound completed for duration as noted below including:  Ultrasound applied to dorsum of R hand for 8 minutes, frequency of 3 MHz, 20% duty cycle, and 1.0 W/cm with pt's arm placed on soft towel for promotion of ROM, edema reduction, and pain  reduction in affected extremity.  Exercises: - Seated Shoulder External Rotation PROM on Table  - 3-4 x daily - 3-5 reps - 15-20 sec hold - Sleeper Stretch  - 3-4 x daily - 5 reps - 15-20 sec hold - Seated Single Arm Shoulder PNF D1 Flexion  - 4-6 x daily - 10-15 reps - Wrist Flexion Stretch  - 4 x daily - 3-5 reps - 15 sec hold - Wrist Extension Stretch Pronated  - 4 x daily - 3-5 reps - 15 hold - Bend and Pull Back Wrist SLOWLY  - 4 x daily - 10-15 reps - Tendon Glides  - 4-6 x daily - 3-5 reps - 2-3 seconds hold - Thumb Opposition  - 4-6 x daily - 10 reps - Seated Single Finger Extension  - 4-6 x daily - 10-15 reps - Full Fist  - 2-3 x daily - 5 reps - Finger Extension "Pizza!"   - 2-3 x daily - 5 reps - Finger Pinch and Pull with Putty  - 2-3 x daily - 5 reps - Wrist Extension with Resistance  - 2-4 x daily - 1-2 sets - 10-15 reps - Wrist Flexion with Resistance  - 2-4 x daily - 1-2 sets - 10-15 reps   PATIENT EDUCATION: Education details: See tx section above for details  Person educated: Patient Education method: Veterinary surgeon, Teach back, Handouts  Education comprehension: States and demonstrates understanding, Additional Education required    HOME EXERCISE PROGRAM: Access Code: [email or web address] URL: [email or web address]    GOALS: Goals reviewed with patient? Yes   SHORT TERM GOALS: (STG required if POC>30 days) Target Date: 07/13/22  Pt will obtain protective, custom orthotic. Goal status: D/C he doesn't need at this point   2.  Pt will demo/state understanding of initial HEP to improve pain levels and prerequisite motion. Goal status: 07/24/22: MET but he cannot recall most today    LONG TERM GOALS: Target Date: 09/07/22  Pt will improve functional ability by decreased impairment per PRWE functional assessment from 19.5/40 to 10/40 or better, for better quality of life. Goal status: 07/24/22: 18.5 today, slightly better, admits to favoring Lt hand  and not using right hand at times.   2.  Pt will improve grip strength in Rt hand to at least  40lbs for functional use at home and in IADLs. Goal status:  07/24/22: 25# today  3.  Pt will improve A/ROM in Rt wrist flex & ext from 37/28* respectively, to at least 40* both, to have functional motion for tasks like reach and grasp.  Goal status:  07/24/22: now 35* / 42* respectively   4.  Pt will improve strength in Rt wrist flex, ext to at least 4+/5 MMT to have increased functional ability to carry out selfcare and higher-level homecare tasks with no difficulty. Goal status: 07/24/22: now 4+/5 flexion, but 4-/5 ext  5.  Pt will improve coordination skills in Rt hand, as seen by better score on 9HPT testing to at least 55sec to have increased functional ability to carry out fine motor tasks (fasteners, etc.) and more complex, coordinated IADLs (meal prep, sports, etc.).  Goal status: 07/24/22: today 67 sec     ASSESSMENT:  CLINICAL IMPRESSION: Pt continues to benefit from skilled OT services to improve edema, ROM, strength, and coordination in RUE as needed to complete ADLs and IADLs more independently and safely.     PLAN: OT FREQUENCY: 2x/week (12 more visits)   OT DURATION: 6 additional weeks (through 09/07/22 as needed)   PLANNED INTERVENTIONS: self care/ADL training, therapeutic exercise, therapeutic activity, neuromuscular re-education, manual therapy, passive range of motion, functional mobility training, splinting, electrical stimulation, ultrasound, fluidotherapy, compression bandaging, moist heat, cryotherapy, contrast bath, patient/family education, and coping strategies training  RECOMMENDED OTHER SERVICES: is getting PT for shoulder tears right now. PT was recommended to assess and tx for LE weakness/stroke signs as well. He was also recommended to see cardiologist/neurologist about possible stroke evolution.   CONSULTED AND AGREED WITH PLAN OF CARE: Patient and family  member/caregiver  PLAN FOR NEXT SESSION:  Work toward LTG  Murlean Caller, OTR/L 08/08/2022 [place] Phone: [phone] Fax: [phone]

## 2022-08-08 ENCOUNTER — Ambulatory Visit: Payer: Medicare HMO | Admitting: Occupational Therapy

## 2022-08-08 ENCOUNTER — Ambulatory Visit: Payer: Medicare HMO | Admitting: Physical Therapy

## 2022-08-08 ENCOUNTER — Encounter: Payer: Self-pay | Admitting: Occupational Therapy

## 2022-08-08 VITALS — BP 155/77

## 2022-08-08 DIAGNOSIS — R278 Other lack of coordination: Secondary | ICD-10-CM

## 2022-08-08 DIAGNOSIS — M25631 Stiffness of right wrist, not elsewhere classified: Secondary | ICD-10-CM

## 2022-08-08 DIAGNOSIS — I69351 Hemiplegia and hemiparesis following cerebral infarction affecting right dominant side: Secondary | ICD-10-CM | POA: Diagnosis not present

## 2022-08-08 DIAGNOSIS — R600 Localized edema: Secondary | ICD-10-CM

## 2022-08-08 DIAGNOSIS — M6281 Muscle weakness (generalized): Secondary | ICD-10-CM

## 2022-08-08 DIAGNOSIS — G8929 Other chronic pain: Secondary | ICD-10-CM

## 2022-08-08 DIAGNOSIS — I63512 Cerebral infarction due to unspecified occlusion or stenosis of left middle cerebral artery: Secondary | ICD-10-CM

## 2022-08-08 DIAGNOSIS — I6381 Other cerebral infarction due to occlusion or stenosis of small artery: Secondary | ICD-10-CM

## 2022-08-08 DIAGNOSIS — R2681 Unsteadiness on feet: Secondary | ICD-10-CM

## 2022-08-08 NOTE — Therapy (Signed)
OUTPATIENT PHYSICAL THERAPY NEURO TREATMENT   Patient Name: Kimothy Leonhart MRN: [id number] DOB:08/19/1946, 76 y.o., male Today's Date: 08/08/2022   PCP: Loura Back NP REFERRING PROVIDER: Micki Riley, MD   PT End of Session - 08/08/22 1107     Visit Number 6    Number of Visits 9   with eval   Date for PT Re-Evaluation 08/31/22   to allow for scheduling conflicts   Authorization Type [employer]    PT Start Time 1105   pt late   PT Stop Time 1145    PT Time Calculation (min) 40 min    Equipment Utilized During Treatment Gait belt    Activity Tolerance Patient tolerated treatment well    Behavior During Therapy [facility] for tasks assessed/performed                Past Medical History:  Diagnosis Date   CVA (cerebral vascular accident) [facility])    april 2023   Diabetes mellitus without complication (HCC)    Hypertension    No past surgical history on file. 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) 01/14/2022   HFrEF (heart failure with reduced ejection fraction) (HCC)    Essential hypertension 01/07/2022   Hyperlipidemia 01/07/2022   Diabetes (HCC) 01/07/2022   Stage 3a chronic kidney disease (CKD) (HCC) 01/07/2022   Transient cerebral ischemia 01/06/2022    ONSET DATE: 07/12/2022  REFERRING DIAG: Q75.916 (ICD-10-CM) - Spastic hemiplegia of right dominant side as late effect of cerebral infarction (HCC) I63.81 (ICD-10-CM) - Lacunar stroke (HCC)  THERAPY DIAG:  Muscle weakness (generalized)  Unsteadiness on feet  Other lack of coordination  Rationale for Evaluation and Treatment: Rehabilitation  SUBJECTIVE:                                                                                                                                                                                             SUBJECTIVE STATEMENT: Pt  reports he has been doing some of his HEP but some days he wakes up and doesn't feel like doing anything. No pain today and no falls since last visit. Pt reports ongoing soreness in his R shoulder, waiting to hear back from ortho doc about an appointment. Pt reports that his HEP remains challenging, especially tandem stance.  Pt accompanied by: family member (niece [name])  PERTINENT HISTORY: hypertension, hyperlipidemia, diabetes mellitus, CKD stage III, CHF with ejection fraction of 25 to 30%, medical noncompliance, recent CVA left hemispheric subcortical  PAIN:  Are  you having pain? No  VITALS Vitals:   08/08/22 1110  BP: (!) 155/77    PRECAUTIONS: Fall  WEIGHT BEARING RESTRICTIONS: No  FALLS: Has patient fallen in last 6 months? Yes. Number of falls 1 in August that led to R shoulder injury (tear in rotator cuff muscles)  LIVING ENVIRONMENT: Lives with: lives alone Lives in: House/apartment Stairs: No (can use elevator) Has following equipment at home: Single point cane  PLOF: Independent with gait, Independent with transfers, and Requires assistive device for independence  PATIENT GOALS: "strengthen my right leg and my right arm" "I want to be able to swing a golf club with RUE"  OBJECTIVE:   NMR  Standing RLE 4" beam step overs with focus on decreased R circumduction CGA for balance Increased difficulty stepping backwards over beam with R LE  Standing RLE yard stick step overs with CGA for balance  Standing yard stick RLE step overs to 2 targets Standing yard stick LLE step overs to 2 targets (increased difficulty due to RLE weakness)  Standing alt L/R target taps with RTB around ankles  Standing alt L/R gumdrop taps with RTB around ankles; min A for balance increased difficulty with control of LLE due to stance on RLE   GAIT: Gait pattern: decreased stance time- Left, decreased hip/knee flexion- Right, circumduction- Right, and lateral lean- Left Distance  walked: 200 ft Assistive device utilized: None Level of assistance: CGA Comments: trial gait with no AD; occasional catching of RLE on the floor   PATIENT EDUCATION: Education details: Continue HEP, practicing proper gait kinematics at home Person educated: Patient and niece [name] Education method: Customer service manager Education comprehension: verbalized understanding  HOME EXERCISE PROGRAM: Access Code: [email or web address] URL: [email or web address] Date: 07/25/2022 Prepared by: Elease Etienne  Exercises - Staggered Sit-to-Stand  - 1 x daily - 4-5 x weekly - 2-3 sets - 10 reps - Standing Tandem Balance with Counter Support  - 1 x daily - 5 x weekly - 1 sets - 2-3 reps - 30-45 seconds hold - Tandem Walking with Counter Support  - 1 x daily - 5-6 x weekly - 3 sets - 10 reps - Mini Squat with Counter Support  - 1 x daily - 7 x weekly - 3 sets - 10 reps - Standing 3-Way Leg Reach with Resistance at Ankles and Counter Support  - 1 x daily - 7 x weekly - 3 sets - 10 reps - Supine Bridge with Mini Swiss Ball Between Knees  - 1 x daily - 7 x weekly - 3 sets - 10 reps   GOALS: Goals reviewed with patient? Yes  SHORT TERM GOALS=LONG-TERM GOALS due to length of POC  LONG TERM GOALS: Target date: 08/17/2022  Pt will be independent with final HEP for improved strength, balance, transfers and gait. Baseline:  Goal status: INITIAL  2.  Pt will demonstrate TUG of </=13 seconds in order to decrease risk of falls and improve functional mobility using LRAD. Baseline: 14.35 sec w/ SPC and R AFO Goal status: INITIAL  3.  Pt will improve FGA score to >/=20/30 in order to demonstrate improved balance and decreased fall risk. Baseline: 14/30 no AD Goal status: INITIAL  4.  Pt will demonstrate a gait speed of >/=3.0 feet/sec using LRAD and AFO in order to decrease risk for falls. Baseline: 2.65 ft/sec w/ SPC and R AFO Goal status: INITIAL   ASSESSMENT:  CLINICAL  IMPRESSION: Emphasis of skilled PT session on continuing to work on RLE [employer]  in order to improve limb control and gait mechanics during functional mobility. Pt continues to exhibit compensations during gait including R hip circumduction and L lateral lean. Pt does exhibit increased awareness of deficits and actively works towards correcting them during gait. Pt continues to benefit from skilled therapy services to address ongoing R hemibody weakness and decreased balance and safety with functional mobility. Continue POC.    OBJECTIVE IMPAIRMENTS: Abnormal gait, decreased balance, decreased coordination, decreased endurance, decreased mobility, and decreased strength.   ACTIVITY LIMITATIONS: carrying, lifting, bending, squatting, stairs, and transfers  PARTICIPATION LIMITATIONS: community activity  PERSONAL FACTORS: Age and 1-2 comorbidities:    hypertension, hyperlipidemia, diabetes mellitus, CKD stage III, CHF with ejection fraction of 25 to 30%, medical noncompliance, recent CVA left hemispheric subcortical region are also affecting patient's functional outcome.   REHAB POTENTIAL: Good  CLINICAL DECISION MAKING: Stable/uncomplicated  EVALUATION COMPLEXITY: Low  PLAN:  PT FREQUENCY: 2x/week  PT DURATION: 6 weeks (9 visits)  PLANNED INTERVENTIONS: Therapeutic exercises, Therapeutic activity, Neuromuscular re-education, Balance training, Gait training, Patient/Family education, Self Care, Joint mobilization, Stair training, Vestibular training, Canalith repositioning, Visual/preceptual remediation/compensation, Orthotic/Fit training, DME instructions, Aquatic Therapy, Dry Needling, Electrical stimulation, Cryotherapy, Moist heat, Taping, Manual therapy, and Re-evaluation  PLAN FOR NEXT SESSION: check L BP!, modify HEP for strength/balance prn, RLE NMR, Hip IR/abd/extension strength, lateral weight shifting, increased step length w/gait, work on stairs with one HR working towards alternating  gait pattern   Peter Congo, PT, DPT, CSRS 08/08/2022, 11:48 AM

## 2022-08-13 ENCOUNTER — Ambulatory Visit: Payer: Medicare HMO | Admitting: Physical Therapy

## 2022-08-13 VITALS — BP 171/83 | HR 81

## 2022-08-13 DIAGNOSIS — R2681 Unsteadiness on feet: Secondary | ICD-10-CM

## 2022-08-13 DIAGNOSIS — I69351 Hemiplegia and hemiparesis following cerebral infarction affecting right dominant side: Secondary | ICD-10-CM | POA: Diagnosis not present

## 2022-08-13 DIAGNOSIS — R278 Other lack of coordination: Secondary | ICD-10-CM

## 2022-08-13 DIAGNOSIS — M6281 Muscle weakness (generalized): Secondary | ICD-10-CM

## 2022-08-13 NOTE — Therapy (Signed)
OUTPATIENT PHYSICAL THERAPY NEURO TREATMENT   Patient Name: Joseph Patrick MRN: [id number] DOB:10-29-1945, 76 y.o., male Today's Date: 08/13/2022   PCP: Loura Back NP REFERRING PROVIDER: Micki Riley, MD   PT End of Session - 08/13/22 1446     Visit Number 7    Number of Visits 9   with eval   Date for PT Re-Evaluation 08/31/22   to allow for scheduling conflicts   Authorization Type [employer]    PT Start Time 1445    PT Stop Time 1530    PT Time Calculation (min) 45 min    Equipment Utilized During Treatment --    Activity Tolerance Patient tolerated treatment well    Behavior During Therapy [facility] for tasks assessed/performed                 Past Medical History:  Diagnosis Date   CVA (cerebral vascular accident) [facility])    april 2023   Diabetes mellitus without complication (HCC)    Hypertension    No past surgical history on file. 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) 01/14/2022   HFrEF (heart failure with reduced ejection fraction) (HCC)    Essential hypertension 01/07/2022   Hyperlipidemia 01/07/2022   Diabetes (HCC) 01/07/2022   Stage 3a chronic kidney disease (CKD) (HCC) 01/07/2022   Transient cerebral ischemia 01/06/2022    ONSET DATE: 07/12/2022  REFERRING DIAG: W88.891 (ICD-10-CM) - Spastic hemiplegia of right dominant side as late effect of cerebral infarction (HCC) I63.81 (ICD-10-CM) - Lacunar stroke (HCC)  THERAPY DIAG:  Muscle weakness (generalized)  Unsteadiness on feet  Other lack of coordination  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 Pt reports he still feels weak on his R side, did his exercises on Saturday. Pt reports he had a big meal at [facility] and a nap just before this session.  Pt accompanied by: family member (niece [name])  PERTINENT HISTORY: hypertension, hyperlipidemia, diabetes mellitus, CKD stage III, CHF with ejection fraction of 25 to 30%, medical noncompliance, recent CVA left hemispheric subcortical  PAIN:  Are you having pain? No  VITALS Vitals:   08/13/22 1450  BP: (!) 171/83  Pulse: 81     PRECAUTIONS: Fall  WEIGHT BEARING RESTRICTIONS: No  FALLS: Has patient fallen in last 6 months? Yes. Number of falls 1 in August that led to R shoulder injury (tear in rotator cuff muscles)  LIVING ENVIRONMENT: Lives with: lives alone Lives in: House/apartment Stairs: No (can use elevator) Has following equipment at home: Single point cane  PLOF: Independent with gait, Independent with transfers, and Requires assistive device for independence  PATIENT GOALS: "strengthen my right leg and my right arm" "I want to be able to swing a golf club with RUE"  OBJECTIVE:   THER ACT:  Assessed BP as noted above. Pt educated on importance of watching sodium in his diet especially when eating out and how this can lead to elevated BP. Pt educated that his BP is elevated this date though still within safe range to participate in therapy.  GAIT:  STAIRS:  Level of Assistance: Modified independence  Stair Negotiation Technique: Alternating Pattern  with Single Rail on Right  Number of Stairs: 12   Height of Stairs: 6"  Comments: circumduction of RLE ascending, decreased R knee control descending  THER EX: Reviewed HEP to assess difficulty level for patient. Pt remains challenged by his current HEP so no modifications made to current HEP other than to add exercises as noted  below.  Added 6" step-ups and heel slides to HEP due to ongoing gait deviations with stair navigation and ongoing HS weakness leading to decreased knee flexion during gait.   PATIENT EDUCATION: Education details: Continue and added to HEP, practicing proper gait kinematics at home Person educated: Patient and niece [name] Education method: Explanation, Demonstration, and Handouts Education comprehension: verbalized understanding  HOME EXERCISE PROGRAM: Access Code: [email or web address] URL: [email or web address] Date: 07/25/2022 Prepared by: Camille Bal  Exercises - Staggered Sit-to-Stand  - 1 x daily - 4-5 x weekly - 2-3 sets - 10 reps - Standing Tandem Balance with Counter Support  - 1 x daily - 5 x weekly - 1 sets - 2-3 reps - 30-45 seconds hold - Tandem Walking with Counter Support  - 1 x daily - 5-6 x weekly - 3 sets - 10 reps - Mini Squat with Counter Support  - 1 x daily - 7 x weekly - 3 sets - 10 reps - Standing 3-Way Leg Reach with Resistance at Ankles and Counter Support  - 1 x daily - 7 x weekly - 3 sets - 10 reps - Supine Bridge with Mini Swiss Ball Between Knees  - 1 x daily - 7 x weekly - 3 sets - 10 reps - Forward Step Up with Counter Support  - 1 x daily - 7 x weekly - 3 sets - 10 reps - Supine Heel Slide  - 1 x daily - 7 x weekly - 3 sets - 10 reps   GOALS: Goals reviewed with patient? Yes  SHORT TERM GOALS=LONG-TERM GOALS due to length of POC  LONG TERM GOALS: Target date: 08/17/2022  Pt will be independent with final HEP for improved strength, balance, transfers and gait. Baseline:  Goal status: INITIAL  2.  Pt will demonstrate TUG of </=13 seconds in order to decrease risk of falls and improve functional mobility using LRAD. Baseline: 14.35 sec w/ SPC and R AFO Goal status: INITIAL  3.  Pt will improve FGA score to >/=20/30 in order to demonstrate improved balance and decreased fall risk. Baseline: 14/30 no AD  Goal status: INITIAL  4.  Pt will  demonstrate a gait speed of >/=3.0 feet/sec using LRAD and AFO in order to decrease risk for falls. Baseline: 2.65 ft/sec w/ SPC and R AFO Goal status: INITIAL   ASSESSMENT:  CLINICAL IMPRESSION: Emphasis of skilled PT session on reviewing pt's current HEP to assess challenge level and add to HEP for continued strengthening of RLE in order to improve gait mechanics. Pt continues to exhibit circumduction of R hip with gait and stair navigation as well as decreased R knee flexion during gait. Pt's current HEP remains a good challenge level for him, added to HEP this session. Pt continues to benefit from skilled therapy services due ongoing R hemibody weakness leading to decreased balance and gait deviations. Pt also continues to exhibit some decreased safety awareness and insight into his deficits and functional implications. Continue POC.    OBJECTIVE IMPAIRMENTS: Abnormal gait, decreased balance, decreased coordination, decreased endurance, decreased mobility, and decreased strength.   ACTIVITY LIMITATIONS: carrying, lifting, bending, squatting, stairs, and transfers  PARTICIPATION LIMITATIONS: community activity  PERSONAL FACTORS: Age and 1-2 comorbidities:    hypertension, hyperlipidemia, diabetes mellitus, CKD stage III, CHF with ejection fraction of 25 to 30%, medical noncompliance, recent CVA left hemispheric subcortical region are also affecting patient's functional outcome.   REHAB POTENTIAL: Good  CLINICAL DECISION MAKING: Stable/uncomplicated  EVALUATION COMPLEXITY: Low  PLAN:  PT FREQUENCY: 2x/week  PT DURATION: 6 weeks (9 visits)  PLANNED INTERVENTIONS: Therapeutic exercises, Therapeutic activity, Neuromuscular re-education, Balance training, Gait training, Patient/Family education, Self Care, Joint mobilization, Stair training, Vestibular training, Canalith repositioning, Visual/preceptual remediation/compensation, Orthotic/Fit training, DME instructions, Aquatic Therapy,  Dry Needling, Electrical stimulation, Cryotherapy, Moist heat, Taping, Manual therapy, and Re-evaluation  PLAN FOR NEXT SESSION: check L BP!, RLE NMR, Hip IR/abd/extension and HS strength, lateral weight shifting, increased step length w/gait, try elliptical (pt using this at home), assess LTG and add new LTG   Peter Congo, PT, DPT, CSRS 08/13/2022, 3:36 PM

## 2022-08-15 ENCOUNTER — Ambulatory Visit: Payer: Medicare HMO | Admitting: Physical Therapy

## 2022-08-16 ENCOUNTER — Ambulatory Visit (INDEPENDENT_AMBULATORY_CARE_PROVIDER_SITE_OTHER): Payer: Medicare HMO | Admitting: Sports Medicine

## 2022-08-16 ENCOUNTER — Ambulatory Visit: Payer: Self-pay

## 2022-08-16 ENCOUNTER — Ambulatory Visit: Payer: Medicare HMO | Admitting: Orthopaedic Surgery

## 2022-08-16 ENCOUNTER — Ambulatory Visit: Payer: Medicare HMO | Admitting: Physical Therapy

## 2022-08-16 VITALS — BP 150/67 | HR 75

## 2022-08-16 DIAGNOSIS — G8929 Other chronic pain: Secondary | ICD-10-CM | POA: Diagnosis not present

## 2022-08-16 DIAGNOSIS — R278 Other lack of coordination: Secondary | ICD-10-CM

## 2022-08-16 DIAGNOSIS — R2681 Unsteadiness on feet: Secondary | ICD-10-CM

## 2022-08-16 DIAGNOSIS — I69351 Hemiplegia and hemiparesis following cerebral infarction affecting right dominant side: Secondary | ICD-10-CM | POA: Diagnosis not present

## 2022-08-16 DIAGNOSIS — I63512 Cerebral infarction due to unspecified occlusion or stenosis of left middle cerebral artery: Secondary | ICD-10-CM

## 2022-08-16 DIAGNOSIS — M25511 Pain in right shoulder: Secondary | ICD-10-CM

## 2022-08-16 DIAGNOSIS — M6281 Muscle weakness (generalized): Secondary | ICD-10-CM

## 2022-08-16 MED ORDER — METHYLPREDNISOLONE ACETATE 40 MG/ML IJ SUSP
40.0000 mg | INTRAMUSCULAR | Status: AC | PRN
  Administered 2022-08-16: 40 mg via INTRA_ARTICULAR

## 2022-08-16 MED ORDER — LIDOCAINE HCL 1 % IJ SOLN
2.0000 mL | INTRAMUSCULAR | Status: AC | PRN
  Administered 2022-08-16: 2 mL

## 2022-08-16 MED ORDER — BUPIVACAINE HCL 0.25 % IJ SOLN
2.0000 mL | INTRAMUSCULAR | Status: AC | PRN
  Administered 2022-08-16: 2 mL via INTRA_ARTICULAR

## 2022-08-16 NOTE — Progress Notes (Signed)
   Office Visit Note   Patient: Joseph Patrick           Date of Birth: [birth year]           MRN: [id number] Visit Date: 08/16/2022              Requested by: Loura Back, NP [address] [place],  [place] [address] PCP: Loura Back, NP   Assessment & Plan: Visit Diagnoses:  1. Chronic right shoulder pain     Plan: Impression is chronic rotator cuff arthropathy.  At this time he is happy with how his shoulder is doing.  He would like to try cortisone injections to see if this will help improve range of motion or function.  Fortunately he does not have any significant pain.  We will see him back as needed.  Follow-Up Instructions: No follow-ups on file.   Orders:  No orders of the defined types were placed in this encounter.  No orders of the defined types were placed in this encounter.     Procedures: No procedures performed   Clinical Data: No additional findings.   Subjective: No chief complaint on file.   HPI [name] returns today for follow-up of right shoulder.  He has noticed some popping since he started physical therapy.  The pain is minimal.  Review of Systems   Objective: Vital Signs: There were no vitals taken for this visit.  Physical Exam  Ortho Exam Examination of right shoulder shows crepitus with glenohumeral range of motion.  Exam is consistent with chronic deficiency of the supraspinatus and subscapularis. Specialty Comments:  No specialty comments available.  Imaging: No results found.   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 Social History   Occupational History   Not on file  Tobacco Use   Smoking status: Never   Smokeless tobacco: Never  Vaping Use   Vaping Use: Never used  Substance and Sexual Activity   Alcohol use: Never   Drug use: Never   Sexual activity: Not on file

## 2022-08-16 NOTE — Progress Notes (Signed)
   Procedure Note  Patient: Joseph Patrick             Date of Birth: 1945-10-29           MRN: [id number]             Visit Date: 08/16/2022  Procedures: Visit Diagnoses:  1. Chronic right shoulder pain    Large Joint Inj: R glenohumeral on 08/16/2022 11:33 AM Indications: pain Details: 22 G 3.5 in needle, ultrasound-guided posterior approach Medications: 2 mL lidocaine 1 %; 2 mL bupivacaine 0.25 %; 40 mg methylPREDNISolone acetate 40 MG/ML Outcome: tolerated well, no immediate complications  US-guided glenohumeral joint injection, right shoulder After discussion on risks/benefits/indications, informed verbal consent was obtained. A timeout was then performed. The patient was positioned lying lateral recumbent on examination table. The patient's shoulder was prepped with betadine and multiple alcohol swabs and utilizing ultrasound guidance, the patient's glenohumeral joint was identified on ultrasound. Using ultrasound guidance a 22-gauge, 3.5 inch needle with a mixture of 2:2:1 cc's lidocaine:bupivicaine:depomedrol was directed from a lateral to medial direction via in-plane technique into the glenohumeral joint with visualization of appropriate spread of injectate into the joint. Patient tolerated the procedure well without immediate complications.      Procedure, treatment alternatives, risks and benefits explained, specific risks discussed. Consent was given by the patient. Immediately prior to procedure a time out was called to verify the correct patient, procedure, equipment, support staff and site/side marked as required. Patient was prepped and draped in the usual sterile fashion.     - I evaluated the patient about 10 minutes post-injection and he had improvement in pain and range of motion - follow-up with Dr. Roda Shutters as indicated; I am happy to see them as needed  Madelyn Brunner, DO Primary Care Sports Medicine Physician  [facility]  This note was dictated  using Dragon naturally speaking software and may contain errors in syntax, spelling, or content which have not been identified prior to signing this note.

## 2022-08-16 NOTE — Therapy (Signed)
OUTPATIENT PHYSICAL THERAPY NEURO TREATMENT-RECERT   Patient Name: Joseph Patrick MRN: [id number] DOB:1945/09/20, 76 y.o., male Today's Date: 08/16/2022   PCP: Arthur Holms NP REFERRING PROVIDER: Garvin Fila, MD   PT End of Session - 08/16/22 1404     Visit Number 8    Number of Visits 21   recert   Date for PT Re-Evaluation 09/27/22   to allow for scheduling conflicts   Authorization Type [employer]    Authorization Time Period 08/16/22-09/27/22    Progress Note Due on Visit 10    PT Start Time 1402    PT Stop Time 1449    PT Time Calculation (min) 47 min    Equipment Utilized During Treatment Gait belt    Activity Tolerance Patient tolerated treatment well    Behavior During Therapy [facility] for tasks assessed/performed                  Past Medical History:  Diagnosis Date   CVA (cerebral vascular accident) [facility])    april 2023   Diabetes mellitus without complication ([place])    Hypertension    No past surgical history on file. 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 Pt reports he has been working on the stairs at home as well as his HEP.  Pt accompanied by: family member (niece [name])  PERTINENT HISTORY: hypertension, hyperlipidemia, diabetes mellitus, CKD stage III, CHF with ejection fraction of 25 to 30%, medical noncompliance, recent CVA left hemispheric subcortical  PAIN:  Are you having pain? No  VITALS Vitals:   08/16/22 1409  BP: (!) 150/67  Pulse: 75     PRECAUTIONS: Fall  WEIGHT BEARING RESTRICTIONS: No  FALLS: Has patient fallen in last 6 months? Yes. Number of falls 1 in August that led to R shoulder injury (tear in rotator cuff muscles)  LIVING ENVIRONMENT: Lives with: lives alone Lives in: House/apartment Stairs: No (can use elevator) Has following equipment at home: Single point cane  PLOF: Independent with gait, Independent with transfers, and Requires assistive device for independence  PATIENT GOALS: "strengthen my right leg and my right arm" "I want to be able to swing a golf club with RUE"  OBJECTIVE:   THER ACT:   [facility] PT Assessment - 08/16/22 1416       Ambulation/Gait   Gait velocity 32.8 ft over 17.19 sec = 1.91 ft/sec      Standardized Balance Assessment   Standardized Balance Assessment Timed Up and Go Test      Timed Up and Go Test   TUG Normal TUG    Normal TUG (seconds) 14.18   with SPC     Functional Gait  Assessment   Gait assessed  Yes    Gait Level Surface Walks 20 ft in less than 7 sec but greater than 5.5 sec, uses assistive device, slower speed, mild gait deviations, or deviates 6-10 in outside of the 12 in walkway width.    Change in Gait Speed Makes only minor adjustments to walking speed, or accomplishes a change in speed with significant gait deviations, deviates  10-15 in outside the 12 in walkway width, or changes speed but loses balance but is able to recover and continue walking.    Gait with Horizontal Head Turns Performs head turns smoothly with slight change in gait velocity (eg, minor disruption to smooth gait path), deviates 6-10 in outside 12 in walkway width, or uses an assistive device.    Gait with Vertical Head Turns Performs head turns with no change in gait. Deviates no more than 6 in outside 12 in walkway width.    Gait and Pivot Turn Turns slowly, requires verbal cueing, or requires several small steps to catch balance following turn and stop    Step Over Obstacle Is able to step over one shoe box (4.5 in total height) but must slow down and adjust steps to clear box safely. May require verbal cueing.    Gait with Narrow Base of Support Ambulates 4-7 steps.    Gait with Eyes Closed Walks 20 ft, uses assistive device, slower speed, mild gait deviations, deviates 6-10 in outside 12 in walkway width. Ambulates 20 ft in less than 9 sec but greater than 7 sec.    Ambulating Backwards Walks 20 ft, uses assistive device, slower speed, mild gait deviations, deviates 6-10 in outside 12 in walkway width.    Steps Alternating feet, must use rail.    Total Score 17    FGA comment: 17/30; with SPC             GAIT: Gait pattern: decreased hip/knee flexion- Right and genu recurvatum- Right Distance walked: various clinic distances Assistive device utilized: Single point cane Level of assistance: Modified independence Comments: pt exhibits decreased L lateral lean this date and decreased R hip circumduction, ongoing deficits as noted above   THER EX: SciFit multi-peaks level 4 for 8 minutes using BUE/BLEs for neural priming for reciprocal movement, dynamic cardiovascular warmup and increased amplitude of stepping. RPE  of "a lot"/10 following activity.   Attempted to use elliptical as pt reports he has one at home. Pt needs min A in order to  safely mount and dismount the elliptical. Also while exercising on elliptical pt holds RLE in knee flexed position for stability, does not work on flexing/extending knee while on machine. Educated pt that he is not safe to use the elliptical for exercise at this time. Pt to take picture of exercise equipment that he has at home to provide to therapist next session so best option for use at home can be determined.   PATIENT EDUCATION: Education details: Continue HEP, practicing proper gait kinematics at home, PT POC, results of OM and functional implications Person educated: Patient and niece [name] Education method: Customer service manager Education comprehension: verbalized understanding  HOME EXERCISE PROGRAM: Access Code: [email or web address] URL: [email or web address] Date: 07/25/2022 Prepared by: Elease Etienne  Exercises - Staggered Sit-to-Stand  - 1 x daily - 4-5 x weekly - 2-3 sets - 10 reps - Standing Tandem Balance with Counter Support  - 1 x daily - 5 x weekly - 1 sets - 2-3 reps - 30-45 seconds hold - Tandem Walking with Counter Support  - 1 x daily - 5-6 x weekly - 3 sets - 10 reps - Mini Squat with Counter Support  - 1 x daily - 7 x weekly - 3 sets - 10 reps - Standing 3-Way Leg Reach with Resistance at Ankles and Counter Support  - 1 x daily - 7 x weekly - 3 sets - 10 reps - Supine Bridge with Mini Swiss Ball Between Knees  - 1 x daily - 7 x weekly - 3 sets - 10 reps - Forward Step Up with Counter Support  - 1 x daily - 7 x weekly - 3 sets - 10 reps - Supine Heel Slide  - 1 x daily - 7 x weekly - 3 sets - 10 reps   GOALS: Goals reviewed with patient? Yes  SHORT TERM GOALS=LONG-TERM GOALS due to length of POC  LONG TERM GOALS: Target date: 08/17/2022  Pt will be independent with final HEP for improved strength, balance, transfers and gait. Baseline:  Goal status: IN PROGRESS  2.  Pt will demonstrate TUG of </=13 seconds in order to decrease risk of falls  and improve functional mobility using LRAD. Baseline: 14.35 sec w/ SPC and R AFO, 14.18 sec with SPC and R AFO (11/30) Goal status: IN PROGRESS  3.  Pt will improve FGA score to >/=20/30 in order to demonstrate improved balance and decreased fall risk. Baseline: 14/30 no AD, 17/30 with SPC (11/30) Goal status: IN PROGRESS  4.  Pt will demonstrate a gait speed of >/=3.0 feet/sec using LRAD and AFO in order to decrease risk for falls. Baseline: 2.65 ft/sec w/ SPC and R AFO, 1.91 ft/sec with SPC and AFO (11/30) Goal status: NOT MET   NEW LONG TERM GOALS: Target date: 09/20/2022  Pt will be independent with final HEP for improved strength, balance, transfers and gait. Baseline:  Goal status: IN PROGRESS  2.  Pt will demonstrate TUG of </=13 seconds in order to decrease risk of falls and improve functional mobility using LRAD. Baseline: 14.35 sec w/ SPC and R AFO, 14.18 sec with SPC and R AFO (11/30) Goal status: IN PROGRESS  3.  Pt will improve FGA score to >/=20/30 in order to demonstrate improved balance and decreased fall risk. Baseline: 14/30 no AD, 17/30 with SPC (11/30) Goal status: IN  PROGRESS  4.  Pt will demonstrate a gait speed of >/=2.75 feet/sec using LRAD and AFO in order to decrease risk for falls. Baseline: 2.65 ft/sec w/ SPC and R AFO, 1.91 ft/sec with SPC and AFO (11/30) Goal status: REVISED   ASSESSMENT:  CLINICAL IMPRESSION: Emphasis of skilled PT session on reassessing LTG for recertification of PT services. Pt has met 0/4 LTG but is making progress towards 3/4 LTG. Pt is independent with his initial HEP but as he is going to continue PT services has not yet received his final HEP. Pt has improved his TUG score from 14.35 sec to 14.18 sec and has improved his FGA score from 14/30 to 17/30, demonstrating improved balance and decreased fall risk. However, pt's gait speed decreased from 2.65 ft/sec initially to 1.91 ft/sec this date. Pt continues to benefit from skilled  therapy services due to decreased balance and decreased RLE strength and coordination. Continue POC.    OBJECTIVE IMPAIRMENTS: Abnormal gait, decreased balance, decreased coordination, decreased endurance, decreased mobility, and decreased strength.   ACTIVITY LIMITATIONS: carrying, lifting, bending, squatting, stairs, and transfers  PARTICIPATION LIMITATIONS: community activity  PERSONAL FACTORS: Age and 1-2 comorbidities:    hypertension, hyperlipidemia, diabetes mellitus, CKD stage III, CHF with ejection fraction of 25 to 30%, medical noncompliance, recent CVA left hemispheric subcortical region are also affecting patient's functional outcome.   REHAB POTENTIAL: Good  CLINICAL DECISION MAKING: Stable/uncomplicated  EVALUATION COMPLEXITY: Low  PLAN:  PT FREQUENCY: 2x/week  PT DURATION: 6 weeks + 12 visits (recert)  PLANNED INTERVENTIONS: Therapeutic exercises, Therapeutic activity, Neuromuscular re-education, Balance training, Gait training, Patient/Family education, Self Care, Joint mobilization, Stair training, Vestibular training, Canalith repositioning, Visual/preceptual remediation/compensation, Orthotic/Fit training, DME instructions, Aquatic Therapy, Dry Needling, Electrical stimulation, Cryotherapy, Moist heat, Taping, Manual therapy, and Re-evaluation  PLAN FOR NEXT SESSION: check L BP!, RLE NMR, Hip flexor and HS strengthening, lateral weight shifting, increased step length w/gait, did pt bring pictures of his exercise equipment at home?   Excell Seltzer, PT, DPT, CSRS 08/16/2022, 4:00 PM

## 2022-08-20 ENCOUNTER — Ambulatory Visit: Payer: Medicare HMO | Admitting: Physical Therapy

## 2022-08-20 ENCOUNTER — Ambulatory Visit: Payer: Medicare HMO | Admitting: Occupational Therapy

## 2022-08-22 ENCOUNTER — Ambulatory Visit: Payer: Medicare HMO | Admitting: Physical Therapy

## 2022-08-22 ENCOUNTER — Ambulatory Visit: Payer: Medicare HMO | Attending: Physician Assistant | Admitting: Occupational Therapy

## 2022-08-22 ENCOUNTER — Encounter: Payer: Self-pay | Admitting: Occupational Therapy

## 2022-08-22 DIAGNOSIS — I6381 Other cerebral infarction due to occlusion or stenosis of small artery: Secondary | ICD-10-CM | POA: Diagnosis present

## 2022-08-22 DIAGNOSIS — M25631 Stiffness of right wrist, not elsewhere classified: Secondary | ICD-10-CM | POA: Diagnosis present

## 2022-08-22 DIAGNOSIS — R278 Other lack of coordination: Secondary | ICD-10-CM | POA: Diagnosis present

## 2022-08-22 DIAGNOSIS — M25511 Pain in right shoulder: Secondary | ICD-10-CM | POA: Insufficient documentation

## 2022-08-22 DIAGNOSIS — I63512 Cerebral infarction due to unspecified occlusion or stenosis of left middle cerebral artery: Secondary | ICD-10-CM | POA: Diagnosis present

## 2022-08-22 DIAGNOSIS — R600 Localized edema: Secondary | ICD-10-CM | POA: Insufficient documentation

## 2022-08-22 DIAGNOSIS — M6281 Muscle weakness (generalized): Secondary | ICD-10-CM | POA: Diagnosis present

## 2022-08-22 DIAGNOSIS — G8929 Other chronic pain: Secondary | ICD-10-CM | POA: Insufficient documentation

## 2022-08-22 DIAGNOSIS — I69351 Hemiplegia and hemiparesis following cerebral infarction affecting right dominant side: Secondary | ICD-10-CM | POA: Insufficient documentation

## 2022-08-22 DIAGNOSIS — R2681 Unsteadiness on feet: Secondary | ICD-10-CM | POA: Insufficient documentation

## 2022-08-22 NOTE — Therapy (Signed)
OUTPATIENT OCCUPATIONAL THERAPY TREATMENT NOTE  Patient Name: Joseph Patrick MRN: [id number] DOB:[birth year], 76 y.o., male Today's Date: 08/22/2022  PCP: Arthur Holms, NP REFERRING PROVIDER: Leandrew Koyanagi, MD     OT End of Session - 08/22/22 1102     Visit Number 11    Number of Visits 24    Date for OT Re-Evaluation 10/12/21    Authorization Type [employer]    Progress Note Due on Visit 18    OT Start Time 1104    OT Stop Time 1147    OT Time Calculation (min) 43 min    Activity Tolerance Patient tolerated treatment well;Patient limited by fatigue;Patient limited by pain    Behavior During Therapy [facility] for tasks assessed/performed               Past Medical History:  Diagnosis Date   CVA (cerebral vascular accident) [facility])    april 2023   Diabetes mellitus without complication ([place])    Hypertension    History reviewed. No pertinent surgical history. 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 ([place]) 01/14/2022   HFrEF (heart failure with reduced ejection fraction) ([place])    Essential hypertension 01/07/2022   Hyperlipidemia 01/07/2022   Diabetes ([place]) 01/07/2022   Stage 3a chronic kidney disease (CKD) ([place]) 01/07/2022   Transient cerebral ischemia 01/06/2022    ONSET DATE:  July 16th, 2023 (about 12 weeks since injury)   REFERRING DIAG: M25.531 (ICD-10-CM) - Pain in right wrist  THERAPY DIAG:  Muscle weakness (generalized)  Unsteadiness on feet  Other lack of coordination  Stiffness of right wrist, not elsewhere classified  Spastic hemiplegia of right dominant side as late effect of cerebral infarction (HCC)  Acute ischemic left MCA stroke (HCC)  Chronic right shoulder pain  Localized edema  Lacunar stroke ([place])  Rationale for Evaluation and Treatment Rehabilitation  PERTINENT HISTORY: Per medical notes, he  fell ~ 2 months ago, tearing (complete supraspinatus) Rt RTC, tore Rt TFCC as well as S-L ligament.  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 He states falling ~2 months ago and wrist bend into flexion and he tore ligaments in his wrist. He states his right hand is now swollen and stiff. He also states having a stroke this April (about 6 months ago) that affected Rt side, but he states rehabbing back to "normal" before fall. He states no significant pain at fall and none now- just swollen, tight, stiff and also poorer coordination now. He states not wearing any wrist brace after fall, tried a sling for his shoulder, which he felt "made him worse," so d/c'd it.   His niece is with him today to support him."  PRECAUTIONS: Fall and Other: Rt RTC tears   WEIGHT BEARING RESTRICTIONS Yes caution for Rt Shoulder and wrist   SUBJECTIVE:   SUBJECTIVE STATEMENT: He states he has been fatigued this morning. Numbness in fingers only with stiffness in proximal phalanges. He is able to lift RUE to cut lights off. He got a crtizone injection in his R shoulder a few days ago. He was told not to use the arm for 2-4 days after. He has not had a lot of swelling in his hand lately.   PAIN:  Are you having pain?  no  FALLS: Has patient fallen in last 6 months?  Yes. Number of falls 1 - this injury  LIVING ENVIRONMENT: Lives with: Alone. He states driving since stroke. He uses a cane for stability.    OBJECTIVE: (All objective assessments below are from initial evaluation on: 06/28/22 unless otherwise specified.)    HAND DOMINANCE: Right   ADLs: Overall ADLs: He states problems with dressing, bathing, turning door handles, and FMS buttoning with Rt hand now.   FUNCTIONAL OUTCOME MEASURES: 07/24/22: PRWE: Pain: 3/50; Function: 18.5/40; Total Score: 21.5/90 (Higher Score  =  More Pain and/or Debility)   Eval: Patient Rated Wrist Evaluation (PRWE) (modified to  exclude work and recreational activities as he states not doing at baseline): Pain: 0/50; Function: 19.5/40; Total Score: 19.5/90 (Higher Score  =  More Pain and/or Debility)    UPPER EXTREMITY ROM     Shoulder to Wrist AROM Right eval Left eval Rt 07/10/22 Rt 07/24/22  Forearm supination 63   56  Forearm pronation  80   83  Wrist flexion 37* (tender) (40* PROM)  50* 47 35  Wrist extension 28* (40* PROM)  68* 36 42  Wrist ulnar deviation 7*  19 25  Wrist radial deviation 5*  14 15  Functional dart thrower's motion (F-DTM) in ulnar flexion 25*     F-DTM in radial extension  27*     (Blank rows = not tested)   Hand AROM Right eval Right 07/24/22  Full Fist Ability (or Gap to Distal Palmar Crease) Loose fist (fingers barely touch palm Still can touch palm with all fingers with effort  Thumb Opposition to Small Finger (or Gap) 1.5cm gap and slow motion ~1.5 - 2cm gap to oppose  Thumb Opposition to Base of Small Finger (or Gap)  unable unable  (Blank rows = not tested)   UPPER EXTREMITY MMT:    Eval:  NT at eval due to concern for ligament tears, however he was stable and non-painful and this will be tested next session to help "tease out" CVA type symptoms as well. Definite weakness Rt vs Lt.   MMT Right 07/03/22 Rt 07/24/22  Elbow flexion 5/5 good tone, poor endurance 4+/5 MMT  Elbow extension 4-/5 lower tone 4+/5 MMT  Forearm supination 4+/5 good tone 4+/5 MMT  Forearm pronation 4+/5 good tone 5/5 MMY  Wrist flexion 4+/5 4+/5 MMT  Wrist/finger extension 3+/5 4-/5 MMT  (Blank rows = not tested)  HAND FUNCTION: 07/24/22: Rt grip today: 25.7#  07/03/22: Rt 19# grip (no pain); Lt: 118#    COORDINATION: 07/24/22: Box & Blocks Test Right 33 Blocks Today (46 is WFL)  9HPT: 9HPT: Rt 52mn 7sec   07/03/22: Box & Blocks Test Right 21 Blocks Today (46 is WFL)   Eval: He has observed ataxic motion with finger to nose test, observed slow and poor FMS in Rt hand compared to Lt.   9HPT: Rt 117m 46sec with several pegs dropped.   SENSATION: Eval:  Light touch intact today, he states equal b/l   EDEMA:   07/03/22: Rt hand figure of 8:   48.7cm today  (Lt hand is 48.8cm);  Rt IF base 7.5cm (Lt hand is 7.2cm)   COGNITION: 07/24/22: more overt memory issues since start of care, he can't recall doing box and blocks before, he forgets mentioning lateral shoulder pain, seems to discuss the same things over during each session, etc.   Eval: Overall cognitive status: [facility] evaluation today, though states some memory issues  OBSERVATIONS:   Eval: no facial droop, but  Rt sided ataxia present in UE and LE, poor tone and coordination noted as well.  Concern for exacerbation of CVA as main issue vs ligament tears and orthopedic issues.  (Rt wrist seems stable, negative Watson's Test, no DRUJ instability, negative TFCC shear test)   TODAY'S TREATMENT:   - Therapeutic exercises completed for duration as noted below including:  Wrist jux-a-cisor with use of R with cues to keep full grasp on base at all times x5 for improved wrist ROM and grip strength of affected extremity  Wrist flex and ext with yellow flex bar x 2 min for strength and endurance of affected extremity  Supination with yellow flex bar x 2 min for strength and endurance of affected extremity  Pronation with yellow flex bar x 2 min for strength and endurance of affected extremity  Tennis ball rotation on plate for R wrist ROM and proprioception; modifications required  With use of wheel, patient completed pronation and supination roll of R for to promote improved ROM of affected extremity.  - Ultrasound completed for duration as noted below including:  Ultrasound applied to dorsum of R hand for 8 minutes, frequency of 3 MHz, 20% duty cycle, and 1.0 W/cm with pt's arm placed on soft towel for promotion of ROM, edema reduction, and pain reduction in affected extremity.  Exercises: - Seated Shoulder External  Rotation PROM on Table  - 3-4 x daily - 3-5 reps - 15-20 sec hold - Sleeper Stretch  - 3-4 x daily - 5 reps - 15-20 sec hold - Seated Single Arm Shoulder PNF D1 Flexion  - 4-6 x daily - 10-15 reps - Wrist Flexion Stretch  - 4 x daily - 3-5 reps - 15 sec hold - Wrist Extension Stretch Pronated  - 4 x daily - 3-5 reps - 15 hold - Bend and Pull Back Wrist SLOWLY  - 4 x daily - 10-15 reps - Tendon Glides  - 4-6 x daily - 3-5 reps - 2-3 seconds hold - Thumb Opposition  - 4-6 x daily - 10 reps - Seated Single Finger Extension  - 4-6 x daily - 10-15 reps - Full Fist  - 2-3 x daily - 5 reps - Finger Extension "Pizza!"   - 2-3 x daily - 5 reps - Finger Pinch and Pull with Putty  - 2-3 x daily - 5 reps - Wrist Extension with Resistance  - 2-4 x daily - 1-2 sets - 10-15 reps - Wrist Flexion with Resistance  - 2-4 x daily - 1-2 sets - 10-15 reps   PATIENT EDUCATION: Education details: See tx section above for details  Person educated: Patient; niece Education method: Verbal Instruction, Teach back, Handouts  Education comprehension: States and demonstrates understanding, Additional Education required    HOME EXERCISE PROGRAM: Access Code: [email or web address] URL: [email or web address]    GOALS: Goals reviewed with patient? Yes   SHORT TERM GOALS: (STG required if POC>30 days) Target Date: 07/13/22  Pt will obtain protective, custom orthotic. Goal status: D/C he doesn't need at this point   2.  Pt will demo/state understanding of initial HEP to improve pain levels and prerequisite motion. Goal status: 07/24/22: MET but he cannot recall most today    LONG TERM GOALS: Target Date: 09/07/22  Pt will improve functional ability by decreased impairment per PRWE functional assessment from 19.5/40 to 10/40 or better, for better quality of life. Goal status: 07/24/22: 18.5 today, slightly better, admits to favoring Lt hand and not using right hand at times.  2.  Pt will improve grip  strength in Rt hand to at least 40lbs for functional use at home and in IADLs. Goal status:  07/24/22: 25# today  3.  Pt will improve A/ROM in Rt wrist flex & ext from 37/28* respectively, to at least 40* both, to have functional motion for tasks like reach and grasp.  Goal status:  07/24/22: now 35* / 42* respectively   4.  Pt will improve strength in Rt wrist flex, ext to at least 4+/5 MMT to have increased functional ability to carry out selfcare and higher-level homecare tasks with no difficulty. Goal status: 07/24/22: now 4+/5 flexion, but 4-/5 ext  5.  Pt will improve coordination skills in Rt hand, as seen by better score on 9HPT testing to at least 55sec to have increased functional ability to carry out fine motor tasks (fasteners, etc.) and more complex, coordinated IADLs (meal prep, sports, etc.).  Goal status: 07/24/22: today 67 sec     ASSESSMENT:  CLINICAL IMPRESSION: Pt continues to benefit from skilled OT services to improve edema, ROM, strength, and coordination in RUE as needed to complete ADLs and IADLs more independently and safely.    PLAN: OT FREQUENCY: 2x/week (12 more visits)   OT DURATION: 6 additional weeks (through 09/07/22 as needed)   PLANNED INTERVENTIONS: self care/ADL training, therapeutic exercise, therapeutic activity, neuromuscular re-education, manual therapy, passive range of motion, functional mobility training, splinting, electrical stimulation, ultrasound, fluidotherapy, compression bandaging, moist heat, cryotherapy, contrast bath, patient/family education, and coping strategies training  RECOMMENDED OTHER SERVICES: is getting PT for shoulder tears right now. PT was recommended to assess and tx for LE weakness/stroke signs as well. He was also recommended to see cardiologist/neurologist about possible stroke evolution.   CONSULTED AND AGREED WITH PLAN OF CARE: Patient and family member/caregiver  PLAN FOR NEXT SESSION:  Work toward LTG  Murlean Caller, OTR/L 08/22/2022 Occupational [place] Phone: [phone] Fax: [phone]

## 2022-08-24 ENCOUNTER — Ambulatory Visit: Payer: Medicare HMO | Admitting: Occupational Therapy

## 2022-08-24 ENCOUNTER — Ambulatory Visit: Payer: Medicare HMO | Admitting: Physical Therapy

## 2022-08-24 ENCOUNTER — Encounter: Payer: Self-pay | Admitting: Occupational Therapy

## 2022-08-24 VITALS — BP 121/66 | HR 70

## 2022-08-24 DIAGNOSIS — M6281 Muscle weakness (generalized): Secondary | ICD-10-CM

## 2022-08-24 DIAGNOSIS — G8929 Other chronic pain: Secondary | ICD-10-CM

## 2022-08-24 DIAGNOSIS — R278 Other lack of coordination: Secondary | ICD-10-CM

## 2022-08-24 DIAGNOSIS — R2681 Unsteadiness on feet: Secondary | ICD-10-CM

## 2022-08-24 DIAGNOSIS — I69351 Hemiplegia and hemiparesis following cerebral infarction affecting right dominant side: Secondary | ICD-10-CM

## 2022-08-24 DIAGNOSIS — I6381 Other cerebral infarction due to occlusion or stenosis of small artery: Secondary | ICD-10-CM

## 2022-08-24 DIAGNOSIS — I63512 Cerebral infarction due to unspecified occlusion or stenosis of left middle cerebral artery: Secondary | ICD-10-CM

## 2022-08-24 DIAGNOSIS — M25631 Stiffness of right wrist, not elsewhere classified: Secondary | ICD-10-CM

## 2022-08-24 NOTE — Therapy (Signed)
OUTPATIENT PHYSICAL THERAPY NEURO TREATMENT   Patient Name: Joseph Patrick MRN: [id number] DOB:[birth year], 76 y.o., male Today's Date: 08/24/2022   PCP: Arthur Holms NP REFERRING PROVIDER: Garvin Fila, MD   PT End of Session - 08/24/22 1016     Visit Number 9    Number of Visits 21   recert   Date for PT Re-Evaluation 09/27/22   to allow for scheduling conflicts   Authorization Type [employer]    Authorization Time Period 08/16/22-09/27/22    Progress Note Due on Visit 10    PT Start Time 1015    PT Stop Time 1059    PT Time Calculation (min) 44 min    Equipment Utilized During Treatment Gait belt    Activity Tolerance Patient tolerated treatment well    Behavior During Therapy [facility] for tasks assessed/performed                   Past Medical History:  Diagnosis Date   CVA (cerebral vascular accident) [facility])    april 2023   Diabetes mellitus without complication ([place])    Hypertension    No past surgical history on file. 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 ([place]) 01/14/2022   HFrEF (heart failure with reduced ejection fraction) ([place])    Essential hypertension 01/07/2022   Hyperlipidemia 01/07/2022   Diabetes ([place]) 01/07/2022   Stage 3a chronic kidney disease (CKD) ([place]) 01/07/2022   Transient cerebral ischemia 01/06/2022    ONSET DATE: 07/12/2022  REFERRING DIAG: F81.017 (ICD-10-CM) - Spastic hemiplegia of right dominant side as late effect of cerebral infarction (HCC) I63.81 (ICD-10-CM) - Lacunar stroke (HCC)  THERAPY DIAG:  Muscle weakness (generalized)  Unsteadiness on feet  Other lack of coordination  Rationale for Evaluation and Treatment: Rehabilitation  SUBJECTIVE:                                                                                                                                                                                              SUBJECTIVE STATEMENT: Pt reports he is tired today, "I am running off about 4.5 hours of sleep". States he has not been doing his HEP much, "once I sit down I do not feel like doing anything". "I do not care about my shoulder or my arm, I just want to be able to walk and balance myself".   Pt accompanied by: family member (niece [name])  PERTINENT HISTORY: hypertension, hyperlipidemia, diabetes mellitus, CKD stage III, CHF with ejection fraction of 25  to 30%, medical noncompliance, recent CVA left hemispheric subcortical  PAIN:  Are you having pain? No  VITALS Vitals:   08/24/22 1019  BP: 121/66  Pulse: 70      PRECAUTIONS: Fall  WEIGHT BEARING RESTRICTIONS: No  FALLS: Has patient fallen in last 6 months? Yes. Number of falls 1 in August that led to R shoulder injury (tear in rotator cuff muscles)  LIVING ENVIRONMENT: Lives with: lives alone Lives in: House/apartment Stairs: No (can use elevator) Has following equipment at home: Single point cane  PLOF: Independent with gait, Independent with transfers, and Requires assistive device for independence  PATIENT GOALS: "strengthen my right leg and my right arm" "I want to be able to swing a golf club with RUE"  OBJECTIVE:  Ther Act  Pt brought in photo of workout equipment at home. Pt has standing stepper machine and seated pedal bike. At this time, therapist recommending pt only use seated pedal bike due to safety concerns with standing without assistance. Pt perseverated on being able to do stepper despite telling therapist earlier that he has not been on stepper machine in months. Continued to encourage use of seated bike only, niece verbalized understanding but pt did not.   Ther Ex SciFit multi-peaks level 3 for 8 minutes using BLEs only for dynamic cardiovascular conditioning, BLE strength and increased amplitude of stepping. RPE of 5/10 following activity.   NMR  On rockerboard  in L/R direction, standing without UE support x2 minutes for midline orientation, equal lateral weightshifting and facilitation of ankle strategy. Pt able to maintain balance until distracted (could not talk and keep balance)  Progressed to mini squats on board, 2x10 reps without UE support, for equal weightshifting and BLE strength. Pt required min cues for upright posture and correct form during task. Noted pt bracing knees against each other to stabilize.  Standing on board w/horizontal head turns, x10 per side. Noted pt would shift weight to same side as head turn, frequently relying on UE support to stabilize throughout.  Alt toe taps to soccer ball w/BUE support, x20 taps. Min cues to reduce circumduction of RLE to tap ball. Pt reported significant fatigue following activity and requested seated rest break.  Fwd and retro dribbling in // bars, x2 reps each direction w/BUE support. Pt performed retro dribbling well but had significant difficulty in fwd direction due to dragging ball back to him when attempting to lift RLE off ball due to hip flexor weakness.    GAIT: Gait pattern: decreased hip/knee flexion- Right and genu recurvatum- Right Distance walked: various clinic distances Assistive device utilized: Single point cane Level of assistance: Modified independence Comments: pt exhibits increased R hip circumduction, ongoing deficits as noted above  PATIENT EDUCATION: Education details: Continue HEP, only using seated bike at home Person educated: Patient and niece [name] Education method: Customer service manager Education comprehension: verbalized understanding  HOME EXERCISE PROGRAM: Access Code: [email or web address] URL: [email or web address] Date: 07/25/2022 Prepared by: Elease Etienne  Exercises - Staggered Sit-to-Stand  - 1 x daily - 4-5 x weekly - 2-3 sets - 10 reps - Standing Tandem Balance with Counter Support  - 1 x daily - 5 x weekly - 1 sets - 2-3 reps -  30-45 seconds hold - Tandem Walking with Counter Support  - 1 x daily - 5-6 x weekly - 3 sets - 10 reps - Mini Squat with Counter Support  - 1 x daily - 7 x weekly - 3 sets - 10 reps - Standing  3-Way Leg Reach with Resistance at Ankles and Counter Support  - 1 x daily - 7 x weekly - 3 sets - 10 reps - Supine Bridge with Mini Swiss Ball Between Knees  - 1 x daily - 7 x weekly - 3 sets - 10 reps - Forward Step Up with Counter Support  - 1 x daily - 7 x weekly - 3 sets - 10 reps - Supine Heel Slide  - 1 x daily - 7 x weekly - 3 sets - 10 reps   GOALS: Goals reviewed with patient? Yes  SHORT TERM GOALS=LONG-TERM GOALS due to length of POC  LONG TERM GOALS: Target date: 08/17/2022  Pt will be independent with final HEP for improved strength, balance, transfers and gait. Baseline:  Goal status: IN PROGRESS  2.  Pt will demonstrate TUG of </=13 seconds in order to decrease risk of falls and improve functional mobility using LRAD. Baseline: 14.35 sec w/ SPC and R AFO, 14.18 sec with SPC and R AFO (11/30) Goal status: IN PROGRESS  3.  Pt will improve FGA score to >/=20/30 in order to demonstrate improved balance and decreased fall risk. Baseline: 14/30 no AD, 17/30 with SPC (11/30) Goal status: IN PROGRESS  4.  Pt will demonstrate a gait speed of >/=3.0 feet/sec using LRAD and AFO in order to decrease risk for falls. Baseline: 2.65 ft/sec w/ SPC and R AFO, 1.91 ft/sec with SPC and AFO (11/30) Goal status: NOT MET   NEW LONG TERM GOALS: Target date: 09/20/2022  Pt will be independent with final HEP for improved strength, balance, transfers and gait. Baseline:  Goal status: IN PROGRESS  2.  Pt will demonstrate TUG of </=13 seconds in order to decrease risk of falls and improve functional mobility using LRAD. Baseline: 14.35 sec w/ SPC and R AFO, 14.18 sec with SPC and R AFO (11/30) Goal status: IN PROGRESS  3.  Pt will improve FGA score to >/=20/30 in order to demonstrate improved  balance and decreased fall risk. Baseline: 14/30 no AD, 17/30 with SPC (11/30) Goal status: IN PROGRESS  4.  Pt will demonstrate a gait speed of >/=2.75 feet/sec using LRAD and AFO in order to decrease risk for falls. Baseline: 2.65 ft/sec w/ SPC and R AFO, 1.91 ft/sec with SPC and AFO (11/30) Goal status: REVISED   ASSESSMENT:  CLINICAL IMPRESSION: Emphasis of skilled PT session on BLE strength, LE coordination and facilitation of ankle strategy for balance recovery. Pt required mod encouragement to perform today as he stated he was fatigued following OT. Pt continues to demonstrate circumduction of RLE during gait and LE coordination tasks despite cues for increased hip flexor/quad activation. Pt has significnat difficulty w/dual-tasks, frequently losing his balance when speaking or looking around gym. Continue POC.     OBJECTIVE IMPAIRMENTS: Abnormal gait, decreased balance, decreased coordination, decreased endurance, decreased mobility, and decreased strength.   ACTIVITY LIMITATIONS: carrying, lifting, bending, squatting, stairs, and transfers  PARTICIPATION LIMITATIONS: community activity  PERSONAL FACTORS: Age and 1-2 comorbidities:    hypertension, hyperlipidemia, diabetes mellitus, CKD stage III, CHF with ejection fraction of 25 to 30%, medical noncompliance, recent CVA left hemispheric subcortical region are also affecting patient's functional outcome.   REHAB POTENTIAL: Good  CLINICAL DECISION MAKING: Stable/uncomplicated  EVALUATION COMPLEXITY: Low  PLAN:  PT FREQUENCY: 2x/week  PT DURATION: 6 weeks + 12 visits (recert)  PLANNED INTERVENTIONS: Therapeutic exercises, Therapeutic activity, Neuromuscular re-education, Balance training, Gait training, Patient/Family education, Self Care, Joint mobilization, Stair training,  Vestibular training, Canalith repositioning, Visual/preceptual remediation/compensation, Orthotic/Fit training, DME instructions, Aquatic Therapy, Dry  Needling, Electrical stimulation, Cryotherapy, Moist heat, Taping, Manual therapy, and Re-evaluation  PLAN FOR NEXT SESSION: 10th visit PN, check L BP!, RLE NMR, Hip flexor and HS strengthening, lateral weight shifting, increased step length w/gait, intro basic dual-tasks    Manessa Buley E Kamila Broda, PT, DPT 08/24/2022, 11:00 AM

## 2022-08-24 NOTE — Therapy (Signed)
OUTPATIENT OCCUPATIONAL THERAPY TREATMENT NOTE  Patient Name: Joseph Patrick MRN: [id number] DOB:1946-03-14, 76 y.o., male Today's Date: 08/24/2022  PCP: Arthur Holms, NP REFERRING PROVIDER: Leandrew Koyanagi, MD     OT End of Session - 08/24/22 0920     Visit Number 12    Number of Visits 24    Date for OT Re-Evaluation 10/12/21    Authorization Type [employer]    Progress Note Due on Visit 18    OT Start Time 0930    OT Stop Time 1014    OT Time Calculation (min) 44 min    Activity Tolerance Patient tolerated treatment well;Patient limited by fatigue;Patient limited by pain    Behavior During Therapy [facility] for tasks assessed/performed               Past Medical History:  Diagnosis Date   CVA (cerebral vascular accident) [facility])    april 2023   Diabetes mellitus without complication ([place])    Hypertension    History reviewed. No pertinent surgical history. 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 ([place]) 01/14/2022   HFrEF (heart failure with reduced ejection fraction) ([place])    Essential hypertension 01/07/2022   Hyperlipidemia 01/07/2022   Diabetes ([place]) 01/07/2022   Stage 3a chronic kidney disease (CKD) ([place]) 01/07/2022   Transient cerebral ischemia 01/06/2022    ONSET DATE:  July 16th, 2023 (about 12 weeks since injury)   REFERRING DIAG: M25.531 (ICD-10-CM) - Pain in right wrist  THERAPY DIAG:  Muscle weakness (generalized)  Other lack of coordination  Acute ischemic left MCA stroke (HCC)  Spastic hemiplegia of right dominant side as late effect of cerebral infarction (HCC)  Stiffness of right wrist, not elsewhere classified  Chronic right shoulder pain  Lacunar stroke ([place])  Rationale for Evaluation and Treatment Rehabilitation  PERTINENT HISTORY: Per medical notes, he fell ~ 2 months ago, tearing (complete  supraspinatus) Rt RTC, tore Rt TFCC as well as S-L ligament.  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 He states falling ~2 months ago and wrist bend into flexion and he tore ligaments in his wrist. He states his right hand is now swollen and stiff. He also states having a stroke this April (about 6 months ago) that affected Rt side, but he states rehabbing back to "normal" before fall. He states no significant pain at fall and none now- just swollen, tight, stiff and also poorer coordination now. He states not wearing any wrist brace after fall, tried a sling for his shoulder, which he felt "made him worse," so d/c'd it.   His niece is with him today to support him."  PRECAUTIONS: Fall and Other: Rt RTC tears   WEIGHT BEARING RESTRICTIONS Yes caution for Rt Shoulder and wrist   SUBJECTIVE:   SUBJECTIVE STATEMENT: He states he has been fatigued again this morning. He only got 5 hours of sleep. He feels fatigued even if he sleeps too long.   PAIN:  Are you having pain?  no  FALLS: Has patient fallen in last 6 months? Yes. Number of falls 1 - this injury  LIVING ENVIRONMENT: Lives with: Alone. He states driving since stroke. He uses a cane for stability.    OBJECTIVE: (All objective assessments below are from initial evaluation on: 06/28/22 unless otherwise specified.)    HAND  DOMINANCE: Right   ADLs: Overall ADLs: He states problems with dressing, bathing, turning door handles, and FMS buttoning with Rt hand now.   FUNCTIONAL OUTCOME MEASURES: 07/24/22: PRWE: Pain: 3/50; Function: 18.5/40; Total Score: 21.5/90 (Higher Score  =  More Pain and/or Debility)   Eval: Patient Rated Wrist Evaluation (PRWE) (modified to exclude work and recreational activities as he states not doing at baseline): Pain: 0/50; Function: 19.5/40; Total Score: 19.5/90 (Higher Score  =  More Pain and/or Debility)    UPPER EXTREMITY ROM     Shoulder to Wrist AROM  Right eval Left eval Rt 07/10/22 Rt 07/24/22  Forearm supination 63   56  Forearm pronation  80   83  Wrist flexion 37* (tender) (40* PROM)  50* 47 35  Wrist extension 28* (40* PROM)  68* 36 42  Wrist ulnar deviation 7*  19 25  Wrist radial deviation 5*  14 15  Functional dart thrower's motion (F-DTM) in ulnar flexion 25*     F-DTM in radial extension  27*     (Blank rows = not tested)   Hand AROM Right eval Right 07/24/22  Full Fist Ability (or Gap to Distal Palmar Crease) Loose fist (fingers barely touch palm Still can touch palm with all fingers with effort  Thumb Opposition to Small Finger (or Gap) 1.5cm gap and slow motion ~1.5 - 2cm gap to oppose  Thumb Opposition to Base of Small Finger (or Gap)  unable unable  (Blank rows = not tested)   UPPER EXTREMITY MMT:    Eval:  NT at eval due to concern for ligament tears, however he was stable and non-painful and this will be tested next session to help "tease out" CVA type symptoms as well. Definite weakness Rt vs Lt.   MMT Right 07/03/22 Rt 07/24/22  Elbow flexion 5/5 good tone, poor endurance 4+/5 MMT  Elbow extension 4-/5 lower tone 4+/5 MMT  Forearm supination 4+/5 good tone 4+/5 MMT  Forearm pronation 4+/5 good tone 5/5 MMY  Wrist flexion 4+/5 4+/5 MMT  Wrist/finger extension 3+/5 4-/5 MMT  (Blank rows = not tested)  HAND FUNCTION: 07/24/22: Rt grip today: 25.7#  07/03/22: Rt 19# grip (no pain); Lt: 118#    COORDINATION: 07/24/22: Box & Blocks Test Right 33 Blocks Today (46 is WFL)  9HPT: 9HPT: Rt 13mn 7sec   07/03/22: Box & Blocks Test Right 21 Blocks Today (46 is WFL)   Eval: He has observed ataxic motion with finger to nose test, observed slow and poor FMS in Rt hand compared to Lt.  9HPT: Rt 179m 46sec with several pegs dropped.   SENSATION: Eval:  Light touch intact today, he states equal b/l   EDEMA:   07/03/22: Rt hand figure of 8:   48.7cm today  (Lt hand is 48.8cm);  Rt IF base 7.5cm (Lt hand is  7.2cm)   COGNITION: 07/24/22: more overt memory issues since start of care, he can't recall doing box and blocks before, he forgets mentioning lateral shoulder pain, seems to discuss the same things over during each session, etc.   Eval: [facility], though states some memory issues  OBSERVATIONS:   Eval: no facial droop, but Rt sided ataxia present in UE and LE, poor tone and coordination noted as well.  Concern for exacerbation of CVA as main issue vs ligament tears and orthopedic issues.  (Rt wrist seems stable, negative Watson's Test, no DRUJ instability, negative TFCC shear test)  TODAY'S TREATMENT:   - Therapeutic exercises completed for duration as noted below including:  Wrist jux-a-cisor with use of R with cues to keep full grasp on base at all times x5 for improved wrist ROM and grip strength of affected extremity  Wrist flex and ext with yellow flex bar x 2 min for strength and endurance of affected extremity  Supination with yellow flex bar x 2 min for strength and endurance of affected extremity  Pronation with yellow flex bar x 2 min for strength and endurance of affected extremity  Using red Power Web, patient stretched wrist in both flexion and extension for 1 minute each to help with ROM and strength using R hand.   With use of red Power Web, patient completed R composite flexion for ROM and strengthening x 10 with 3 sec hold.    With use of supination/pronation wheel, pt completed R wrist extension and flexion stretch x 1 min each to promote improved ROM through affected extremity.  R supination with weighted rod (2 weights) x 1 min with 3 second hold to promote AROM and strengthen in supination and pronation. Tennis ball rotation in bowl for R wrist ROM and proprioception x 2 min  With use of wheel, patient completed pronation and supination roll of R for to promote improved ROM of affected extremity.  - Ultrasound completed  for duration as noted below including:  Ultrasound applied to dorsum of R hand for 8 minutes, frequency of 3 MHz, 20% duty cycle, and 1.1 W/cm with pt's arm placed on soft towel for promotion of ROM, edema reduction, and pain reduction in affected extremity.  Exercises: - Seated Shoulder External Rotation PROM on Table  - 3-4 x daily - 3-5 reps - 15-20 sec hold - Sleeper Stretch  - 3-4 x daily - 5 reps - 15-20 sec hold - Seated Single Arm Shoulder PNF D1 Flexion  - 4-6 x daily - 10-15 reps - Wrist Flexion Stretch  - 4 x daily - 3-5 reps - 15 sec hold - Wrist Extension Stretch Pronated  - 4 x daily - 3-5 reps - 15 hold - Bend and Pull Back Wrist SLOWLY  - 4 x daily - 10-15 reps - Tendon Glides  - 4-6 x daily - 3-5 reps - 2-3 seconds hold - Thumb Opposition  - 4-6 x daily - 10 reps - Seated Single Finger Extension  - 4-6 x daily - 10-15 reps - Full Fist  - 2-3 x daily - 5 reps - Finger Extension "Pizza!"   - 2-3 x daily - 5 reps - Finger Pinch and Pull with Putty  - 2-3 x daily - 5 reps - Wrist Extension with Resistance  - 2-4 x daily - 1-2 sets - 10-15 reps - Wrist Flexion with Resistance  - 2-4 x daily - 1-2 sets - 10-15 reps   PATIENT EDUCATION: Education details: See tx section above for details  Person educated: Patient; niece Education method: Verbal Instruction, Teach back, Handouts  Education comprehension: States and demonstrates understanding, Additional Education required    HOME EXERCISE PROGRAM: Access Code: [email or web address] URL: [email or web address]    GOALS: Goals reviewed with patient? Yes   SHORT TERM GOALS: (STG required if POC>30 days) Target Date: 07/13/22  Pt will obtain protective, custom orthotic. Goal status: D/C he doesn't need at this point   2.  Pt will demo/state understanding of initial HEP to improve pain levels and prerequisite motion. Goal status: 07/24/22: MET but he cannot recall most  today    LONG TERM GOALS: Target Date:  09/07/22  Pt will improve functional ability by decreased impairment per PRWE functional assessment from 19.5/40 to 10/40 or better, for better quality of life. Goal status: 07/24/22: 18.5 today, slightly better, admits to favoring Lt hand and not using right hand at times.   2.  Pt will improve grip strength in Rt hand to at least 40lbs for functional use at home and in IADLs. Goal status:  07/24/22: 25# today  3.  Pt will improve A/ROM in Rt wrist flex & ext from 37/28* respectively, to at least 40* both, to have functional motion for tasks like reach and grasp.  Goal status:  07/24/22: now 35* / 42* respectively   4.  Pt will improve strength in Rt wrist flex, ext to at least 4+/5 MMT to have increased functional ability to carry out selfcare and higher-level homecare tasks with no difficulty. Goal status: 07/24/22: now 4+/5 flexion, but 4-/5 ext  5.  Pt will improve coordination skills in Rt hand, as seen by better score on 9HPT testing to at least 55sec to have increased functional ability to carry out fine motor tasks (fasteners, etc.) and more complex, coordinated IADLs (meal prep, sports, etc.).  Goal status: 07/24/22: today 67 sec     ASSESSMENT:  CLINICAL IMPRESSION: Pt continues to benefit from skilled OT services to improve edema, ROM, strength, and coordination in RUE as needed to complete ADLs and IADLs more independently and safely. Continue to notice improvement to RUE swelling.    PLAN: OT FREQUENCY: 2x/week (12 more visits)   OT DURATION: 6 additional weeks (through 09/07/22 as needed)   PLANNED INTERVENTIONS: self care/ADL training, therapeutic exercise, therapeutic activity, neuromuscular re-education, manual therapy, passive range of motion, functional mobility training, splinting, electrical stimulation, ultrasound, fluidotherapy, compression bandaging, moist heat, cryotherapy, contrast bath, patient/family education, and coping strategies training  RECOMMENDED OTHER  SERVICES: is getting PT for shoulder tears right now. PT was recommended to assess and tx for LE weakness/stroke signs as well. He was also recommended to see cardiologist/neurologist about possible stroke evolution.   CONSULTED AND AGREED WITH PLAN OF CARE: Patient and family member/caregiver  PLAN FOR NEXT SESSION:  Work toward LTG  Murlean Caller, OTR/L 08/24/2022 Occupational [place] Phone: [phone] Fax: [phone]

## 2022-08-28 ENCOUNTER — Ambulatory Visit: Payer: Medicare HMO | Admitting: Occupational Therapy

## 2022-08-28 ENCOUNTER — Ambulatory Visit: Payer: Medicare HMO | Admitting: Physical Therapy

## 2022-08-28 ENCOUNTER — Encounter: Payer: Self-pay | Admitting: Occupational Therapy

## 2022-08-28 VITALS — BP 131/69 | HR 63

## 2022-08-28 DIAGNOSIS — M6281 Muscle weakness (generalized): Secondary | ICD-10-CM

## 2022-08-28 DIAGNOSIS — I69351 Hemiplegia and hemiparesis following cerebral infarction affecting right dominant side: Secondary | ICD-10-CM

## 2022-08-28 DIAGNOSIS — R278 Other lack of coordination: Secondary | ICD-10-CM

## 2022-08-28 DIAGNOSIS — I63512 Cerebral infarction due to unspecified occlusion or stenosis of left middle cerebral artery: Secondary | ICD-10-CM

## 2022-08-28 DIAGNOSIS — M25631 Stiffness of right wrist, not elsewhere classified: Secondary | ICD-10-CM

## 2022-08-28 DIAGNOSIS — G8929 Other chronic pain: Secondary | ICD-10-CM

## 2022-08-28 DIAGNOSIS — I6381 Other cerebral infarction due to occlusion or stenosis of small artery: Secondary | ICD-10-CM

## 2022-08-28 DIAGNOSIS — R2681 Unsteadiness on feet: Secondary | ICD-10-CM

## 2022-08-28 DIAGNOSIS — R600 Localized edema: Secondary | ICD-10-CM

## 2022-08-28 NOTE — Therapy (Signed)
OUTPATIENT PHYSICAL THERAPY NEURO TREATMENT-10TH VISIT PROGRESS NOTE   Patient Name: Joseph Patrick MRN: [id number] DOB:[birth year], 76 y.o., male Today's Date: 08/28/2022   PCP: Arthur Holms NP REFERRING PROVIDER: Garvin Fila, MD   PT End of Session - 08/28/22 1104     Visit Number 10    Number of Visits 21   recert   Date for PT Re-Evaluation 09/27/22   to allow for scheduling conflicts   Authorization Type [employer]    Authorization Time Period 08/16/22-09/27/22    Progress Note Due on Visit 10    PT Start Time 1103   from OT session   PT Stop Time 1145    PT Time Calculation (min) 42 min    Equipment Utilized During Treatment Gait belt    Activity Tolerance Patient tolerated treatment well    Behavior During Therapy [facility] for tasks assessed/performed                 Physical Therapy Progress Note   Dates of Reporting Period:07/20/22 - 08/28/22  See Note below for Objective Data and Assessment of Progress/Goals.  Thank you for the referral of this patient. Excell Seltzer, PT, DPT, CSRS    Past Medical History:  Diagnosis Date   CVA (cerebral vascular accident) [facility])    april 2023   Diabetes mellitus without complication ([place])    Hypertension    No past surgical history on file. 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 ([place]) 01/14/2022   HFrEF (heart failure with reduced ejection fraction) ([place])    Essential hypertension 01/07/2022   Hyperlipidemia 01/07/2022   Diabetes ([place]) 01/07/2022   Stage 3a chronic kidney disease (CKD) ([place]) 01/07/2022   Transient cerebral ischemia 01/06/2022    ONSET DATE: 07/12/2022  REFERRING DIAG: Q00.867 (ICD-10-CM) - Spastic hemiplegia of right dominant side as late effect of cerebral infarction (HCC) I63.81 (ICD-10-CM) - Lacunar stroke (HCC)  THERAPY DIAG:  Muscle weakness  (generalized)  Other lack of coordination  Unsteadiness on feet  Rationale for Evaluation and Treatment: Rehabilitation  SUBJECTIVE:                                                                                                                                                                                             SUBJECTIVE STATEMENT: Pt reports he has been walking at home and working on the stairs. Pt reports no falls, no pain today. Pt reports he has been working on his HEP and it remains challenging.  Pt accompanied  by: family member (niece [name])  PERTINENT HISTORY: hypertension, hyperlipidemia, diabetes mellitus, CKD stage III, CHF with ejection fraction of 25 to 30%, medical noncompliance, recent CVA left hemispheric subcortical  PAIN:  Are you having pain? No  VITALS Vitals:   08/28/22 1106  BP: 131/69  Pulse: 63     PRECAUTIONS: Fall  WEIGHT BEARING RESTRICTIONS: No  FALLS: Has patient fallen in last 6 months? Yes. Number of falls 1 in August that led to R shoulder injury (tear in rotator cuff muscles)  LIVING ENVIRONMENT: Lives with: lives alone Lives in: House/apartment Stairs: No (can use elevator) Has following equipment at home: Single point cane  PLOF: Independent with gait, Independent with transfers, and Requires assistive device for independence  PATIENT GOALS: "strengthen my right leg and my right arm" "I want to be able to swing a golf club with RUE"  OBJECTIVE:  Ther Act  Standing alt L/R blaze pod taps Random 5 Dot Taps Pt scores 10 reps, 22 reps, 26 reps CGA and use of SPC for balance Pt exhibits increase in speed and increase in # of reps as task progresses Mat to floor transfer with CGA Floor to mat transfer with mod A with cues for limb positioning to best assist with transfer Tall-kneeling on mat on floor Ball toss x 30 reps to fatigue with min A for balance Pt unable to maintain half-kneel on RLE due to limb weakness  Ther  Ex Supine SKFO with RTB 3 x 10 reps B Added to HEP, see bolded below   PATIENT EDUCATION: Education details: Continue HEP and added to HEP, only using seated bike at home Person educated: Patient and niece [name] Education method: Customer service manager Education comprehension: verbalized understanding  HOME EXERCISE PROGRAM: Access Code: [email or web address] URL: [email or web address] Date: 07/25/2022 Prepared by: Elease Etienne  Exercises - Staggered Sit-to-Stand  - 1 x daily - 4-5 x weekly - 2-3 sets - 10 reps - Standing Tandem Balance with Counter Support  - 1 x daily - 5 x weekly - 1 sets - 2-3 reps - 30-45 seconds hold - Tandem Walking with Counter Support  - 1 x daily - 5-6 x weekly - 3 sets - 10 reps - Mini Squat with Counter Support  - 1 x daily - 7 x weekly - 3 sets - 10 reps - Standing 3-Way Leg Reach with Resistance at Ankles and Counter Support  - 1 x daily - 7 x weekly - 3 sets - 10 reps - Supine Bridge with Mini Swiss Ball Between Knees  - 1 x daily - 7 x weekly - 3 sets - 10 reps - Forward Step Up with Counter Support  - 1 x daily - 7 x weekly - 3 sets - 10 reps - Supine Heel Slide  - 1 x daily - 7 x weekly - 3 sets - 10 reps - Hooklying Single Leg Bent Knee Fallouts with Resistance  - 1 x daily - 7 x weekly - 3 sets - 10 reps   GOALS: Goals reviewed with patient? Yes  SHORT TERM GOALS=LONG-TERM GOALS due to length of POC  LONG TERM GOALS: Target date: 08/17/2022  Pt will be independent with final HEP for improved strength, balance, transfers and gait. Baseline:  Goal status: IN PROGRESS  2.  Pt will demonstrate TUG of </=13 seconds in order to decrease risk of falls and improve functional mobility using LRAD. Baseline: 14.35 sec w/ SPC and R AFO, 14.18 sec with SPC and R AFO (  11/30) Goal status: IN PROGRESS  3.  Pt will improve FGA score to >/=20/30 in order to demonstrate improved balance and decreased fall risk. Baseline: 14/30 no AD, 17/30  with SPC (11/30) Goal status: IN PROGRESS  4.  Pt will demonstrate a gait speed of >/=3.0 feet/sec using LRAD and AFO in order to decrease risk for falls. Baseline: 2.65 ft/sec w/ SPC and R AFO, 1.91 ft/sec with SPC and AFO (11/30) Goal status: NOT MET   NEW LONG TERM GOALS: Target date: 09/20/2022  Pt will be independent with final HEP for improved strength, balance, transfers and gait. Baseline:  Goal status: IN PROGRESS  2.  Pt will demonstrate TUG of </=13 seconds in order to decrease risk of falls and improve functional mobility using LRAD. Baseline: 14.35 sec w/ SPC and R AFO, 14.18 sec with SPC and R AFO (11/30) Goal status: IN PROGRESS  3.  Pt will improve FGA score to >/=20/30 in order to demonstrate improved balance and decreased fall risk. Baseline: 14/30 no AD, 17/30 with SPC (11/30) Goal status: IN PROGRESS  4.  Pt will demonstrate a gait speed of >/=2.75 feet/sec using LRAD and AFO in order to decrease risk for falls. Baseline: 2.65 ft/sec w/ SPC and R AFO, 1.91 ft/sec with SPC and AFO (11/30) Goal status: REVISED   ASSESSMENT:  CLINICAL IMPRESSION: Emphasis of skilled PT session on working on RLE strengthening and NMR and balance in SLS. Pt continues to exhibit R hemibody weakness leading to decreased safety and independence with functional mobility. Pt continues to benefit from skilled therapy services to address this and in order to decrease his fall risk. Continue POC.     OBJECTIVE IMPAIRMENTS: Abnormal gait, decreased balance, decreased coordination, decreased endurance, decreased mobility, and decreased strength.   ACTIVITY LIMITATIONS: carrying, lifting, bending, squatting, stairs, and transfers  PARTICIPATION LIMITATIONS: community activity  PERSONAL FACTORS: Age and 1-2 comorbidities:    hypertension, hyperlipidemia, diabetes mellitus, CKD stage III, CHF with ejection fraction of 25 to 30%, medical noncompliance, recent CVA left hemispheric subcortical  region are also affecting patient's functional outcome.   REHAB POTENTIAL: Good  CLINICAL DECISION MAKING: Stable/uncomplicated  EVALUATION COMPLEXITY: Low  PLAN:  PT FREQUENCY: 2x/week  PT DURATION: 6 weeks + 12 visits (recert)  PLANNED INTERVENTIONS: Therapeutic exercises, Therapeutic activity, Neuromuscular re-education, Balance training, Gait training, Patient/Family education, Self Care, Joint mobilization, Stair training, Vestibular training, Canalith repositioning, Visual/preceptual remediation/compensation, Orthotic/Fit training, DME instructions, Aquatic Therapy, Dry Needling, Electrical stimulation, Cryotherapy, Moist heat, Taping, Manual therapy, and Re-evaluation  PLAN FOR NEXT SESSION: check L BP!, RLE NMR, Hip flexor and HS strengthening, lateral weight shifting, increased step length w/gait, intro basic dual-tasks    Excell Seltzer, PT, DPT, CSRS 08/28/2022, 11:46 AM

## 2022-08-28 NOTE — Therapy (Signed)
OUTPATIENT OCCUPATIONAL THERAPY TREATMENT NOTE  Patient Name: Joseph Patrick MRN: [id number] DOB:April 09, 1946, 76 y.o., male Today's Date: 08/28/2022  PCP: Arthur Holms, NP REFERRING PROVIDER: Leandrew Koyanagi, MD     OT End of Session - 08/28/22 1021     Visit Number 13    Number of Visits 24    Date for OT Re-Evaluation 10/12/21    Authorization Type [employer]    Progress Note Due on Visit 18    OT Start Time 1019    OT Stop Time 1059    OT Time Calculation (min) 40 min    Activity Tolerance Patient tolerated treatment well;Patient limited by fatigue;Patient limited by pain    Behavior During Therapy [facility] for tasks assessed/performed                Past Medical History:  Diagnosis Date   CVA (cerebral vascular accident) [facility])    april 2023   Diabetes mellitus without complication ([place])    Hypertension    History reviewed. No pertinent surgical history. 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 ([place]) 01/14/2022   HFrEF (heart failure with reduced ejection fraction) ([place])    Essential hypertension 01/07/2022   Hyperlipidemia 01/07/2022   Diabetes ([place]) 01/07/2022   Stage 3a chronic kidney disease (CKD) ([place]) 01/07/2022   Transient cerebral ischemia 01/06/2022    ONSET DATE:  July 16th, 2023 (about 12 weeks since injury)   REFERRING DIAG: M25.531 (ICD-10-CM) - Pain in right wrist  THERAPY DIAG:  Muscle weakness (generalized)  Other lack of coordination  Acute ischemic left MCA stroke (HCC)  Spastic hemiplegia of right dominant side as late effect of cerebral infarction (HCC)  Stiffness of right wrist, not elsewhere classified  Chronic right shoulder pain  Lacunar stroke ([place])  Localized edema  Rationale for Evaluation and Treatment Rehabilitation  PERTINENT HISTORY: Per medical notes, he fell ~ 2 months ago,  tearing (complete supraspinatus) Rt RTC, tore Rt TFCC as well as S-L ligament.  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 He states falling ~2 months ago and wrist bend into flexion and he tore ligaments in his wrist. He states his right hand is now swollen and stiff. He also states having a stroke this April (about 6 months ago) that affected Rt side, but he states rehabbing back to "normal" before fall. He states no significant pain at fall and none now- just swollen, tight, stiff and also poorer coordination now. He states not wearing any wrist brace after fall, tried a sling for his shoulder, which he felt "made him worse," so d/c'd it.   His niece is with him today to support him."  PRECAUTIONS: Fall and Other: Rt RTC tears   WEIGHT BEARING RESTRICTIONS Yes caution for Rt Shoulder and wrist   SUBJECTIVE:   SUBJECTIVE STATEMENT: Pt states he know his hand and wrist movements have improved but feels limited during OT session due to R shoulder pain.   PAIN:  Are you having pain?  No (as reported initially); Pt reports R shoulder pain with treatment today consistent with 4/10 using FACES scale with noted improvement using modifications.   FALLS: Has patient fallen in last 6 months? Yes. Number of falls 1 - this injury  LIVING ENVIRONMENT: Lives with: Alone. He states driving since stroke. He uses a  cane for stability.    OBJECTIVE: (All objective assessments below are from initial evaluation on: 06/28/22 unless otherwise specified.)    HAND DOMINANCE: Right   ADLs: Overall ADLs: He states problems with dressing, bathing, turning door handles, and FMS buttoning with Rt hand now.   FUNCTIONAL OUTCOME MEASURES: 07/24/22: PRWE: Pain: 3/50; Function: 18.5/40; Total Score: 21.5/90 (Higher Score  =  More Pain and/or Debility)   Eval: Patient Rated Wrist Evaluation (PRWE) (modified to exclude work and recreational activities as he states not  doing at baseline): Pain: 0/50; Function: 19.5/40; Total Score: 19.5/90 (Higher Score  =  More Pain and/or Debility)    UPPER EXTREMITY ROM     Shoulder to Wrist AROM Right eval Left eval Rt 07/10/22 Rt 07/24/22 Rt  12/12  Forearm supination 63   56 84  Forearm pronation  80   83 84  Wrist flexion 37* (tender) (40* PROM)  50* 47 35 54  Wrist extension 28* (40* PROM)  68* 36 42 44  Wrist ulnar deviation 7*  19 25   Wrist radial deviation 5*  14 15   Functional dart thrower's motion (F-DTM) in ulnar flexion 25*      F-DTM in radial extension  27*      (Blank rows = not tested)   Hand AROM Right eval Right 07/24/22  Full Fist Ability (or Gap to Distal Palmar Crease) Loose fist (fingers barely touch palm Still can touch palm with all fingers with effort  Thumb Opposition to Small Finger (or Gap) 1.5cm gap and slow motion ~1.5 - 2cm gap to oppose  Thumb Opposition to Base of Small Finger (or Gap)  unable unable  (Blank rows = not tested)   UPPER EXTREMITY MMT:    Eval:  NT at eval due to concern for ligament tears, however he was stable and non-painful and this will be tested next session to help "tease out" CVA type symptoms as well. Definite weakness Rt vs Lt.   MMT Right 07/03/22 Rt 07/24/22  Elbow flexion 5/5 good tone, poor endurance 4+/5 MMT  Elbow extension 4-/5 lower tone 4+/5 MMT  Forearm supination 4+/5 good tone 4+/5 MMT  Forearm pronation 4+/5 good tone 5/5 MMY  Wrist flexion 4+/5 4+/5 MMT  Wrist/finger extension 3+/5 4-/5 MMT  (Blank rows = not tested)  HAND FUNCTION: 07/24/22: Rt grip today: 25.7#  07/03/22: Rt 19# grip (no pain); Lt: 118#    COORDINATION: 07/24/22: Box & Blocks Test Right 33 Blocks Today (46 is WFL)  9HPT: 9HPT: Rt 29mn 7sec   07/03/22: Box & Blocks Test Right 21 Blocks Today (46 is WFL)   Eval: He has observed ataxic motion with finger to nose test, observed slow and poor FMS in Rt hand compared to Lt.  9HPT: Rt 149m 46sec with several  pegs dropped.   SENSATION: Eval:  Light touch intact today, he states equal b/l   EDEMA:   07/03/22: Rt hand figure of 8:   48.7cm today  (Lt hand is 48.8cm);  Rt IF base 7.5cm (Lt hand is 7.2cm)   COGNITION: 07/24/22: more overt memory issues since start of care, he can't recall doing box and blocks before, he forgets mentioning lateral shoulder pain, seems to discuss the same things over during each session, etc.   Eval: Overall cognitive status: [facility], though states some memory issues  OBSERVATIONS:   Eval: no facial droop, but Rt sided ataxia present in UE and LE, poor tone and  coordination noted as well.  Concern for exacerbation of CVA as main issue vs ligament tears and orthopedic issues.  (Rt wrist seems stable, negative Watson's Test, no DRUJ instability, negative TFCC shear test)   TODAY'S TREATMENT:   - Therapeutic exercises completed for duration as noted below including:  Pt placed BUE in Fluidotherapy machine with supervised ROM x 10 min. Pt was educated to complete R PROM during modality time to improve ROM and decrease pain/stiffness of affected extremity by use of the machine's massaging action and thermal properties.     With use of supination/pronation wheel, pt completed R wrist extension and flexion stretch x 2 min each to promote improved ROM through affected extremity.  With use of wheel, patient completed pronation and supination roll of R for to promote improved ROM of affected extremity.  - Therapeutic activities completed for duration as noted below including: Pt completed picking up and storing of pennies in R hand and then translating stored items to tips of thumb and index finger before placing them individually onto table. Pt required increased time, positioning modifications with R shoulder pain, and encouragement to complete for improved fine motor coordination.   Exercises: - Seated Shoulder External Rotation PROM on Table  - 3-4 x  daily - 3-5 reps - 15-20 sec hold - Sleeper Stretch  - 3-4 x daily - 5 reps - 15-20 sec hold - Seated Single Arm Shoulder PNF D1 Flexion  - 4-6 x daily - 10-15 reps - Wrist Flexion Stretch  - 4 x daily - 3-5 reps - 15 sec hold - Wrist Extension Stretch Pronated  - 4 x daily - 3-5 reps - 15 hold - Bend and Pull Back Wrist SLOWLY  - 4 x daily - 10-15 reps - Tendon Glides  - 4-6 x daily - 3-5 reps - 2-3 seconds hold - Thumb Opposition  - 4-6 x daily - 10 reps - Seated Single Finger Extension  - 4-6 x daily - 10-15 reps - Full Fist  - 2-3 x daily - 5 reps - Finger Extension "Pizza!"   - 2-3 x daily - 5 reps - Finger Pinch and Pull with Putty  - 2-3 x daily - 5 reps - Wrist Extension with Resistance  - 2-4 x daily - 1-2 sets - 10-15 reps - Wrist Flexion with Resistance  - 2-4 x daily - 1-2 sets - 10-15 reps   PATIENT EDUCATION: Education details: See tx section above for details  Person educated: Patient; niece Education method: Verbal Instruction, Teach back, Handouts  Education comprehension: States and demonstrates understanding, Additional Education required    HOME EXERCISE PROGRAM: Access Code: [email or web address] URL: [email or web address]    GOALS: Goals reviewed with patient? Yes   SHORT TERM GOALS: (STG required if POC>30 days) Target Date: 07/13/22  Pt will obtain protective, custom orthotic. Goal status: D/C he doesn't need at this point   2.  Pt will demo/state understanding of initial HEP to improve pain levels and prerequisite motion. Goal status: 07/24/22: MET but he cannot recall most today    LONG TERM GOALS: Target Date: 09/07/22  Pt will improve functional ability by decreased impairment per PRWE functional assessment from 19.5/40 to 10/40 or better, for better quality of life. Goal status: 07/24/22: 18.5 today, slightly better, admits to favoring Lt hand and not using right hand at times.   2.  Pt will improve grip strength in Rt hand to at least  40lbs for functional use at home and in IADLs.  Goal status:  07/24/22: 25# today  3.  Pt will improve A/ROM in Rt wrist flex & ext from 37/28* respectively, to at least 40* both, to have functional motion for tasks like reach and grasp.  Goal status:  MET 07/24/22: now 35* / 42* respectively  12/12 - 54/44*  4.  Pt will improve strength in Rt wrist flex, ext to at least 4+/5 MMT to have increased functional ability to carry out selfcare and higher-level homecare tasks with no difficulty. Goal status: 07/24/22: now 4+/5 flexion, but 4-/5 ext  5.  Pt will improve coordination skills in Rt hand, as seen by better score on 9HPT testing to at least 55sec to have increased functional ability to carry out fine motor tasks (fasteners, etc.) and more complex, coordinated IADLs (meal prep, sports, etc.).  Goal status: 07/24/22: today 67 sec     ASSESSMENT:  CLINICAL IMPRESSION: Pt continues to benefit from skilled OT services to improve edema, ROM, strength, and coordination in RUE as needed to complete ADLs and IADLs more independently and safely. He will require extension of services past original POC due to limitations with cognition and carryover.   PLAN: OT FREQUENCY: 2x/week   OT DURATION: 24 visits through 10/12/2022   PLANNED INTERVENTIONS: self care/ADL training, therapeutic exercise, therapeutic activity, neuromuscular re-education, manual therapy, passive range of motion, functional mobility training, splinting, electrical stimulation, ultrasound, fluidotherapy, compression bandaging, moist heat, cryotherapy, contrast bath, patient/family education, and coping strategies training  RECOMMENDED OTHER SERVICES: is getting PT for shoulder tears right now. PT was recommended to assess and tx for LE weakness/stroke signs as well. He was also recommended to see cardiologist/neurologist about possible stroke evolution.   CONSULTED AND AGREED WITH PLAN OF CARE: Patient and family  member/caregiver  PLAN FOR NEXT SESSION:  Work toward LTG  Murlean Caller, OTR/L 08/28/2022 [place] Phone: [phone] Fax: [phone]

## 2022-08-29 ENCOUNTER — Other Ambulatory Visit: Payer: Self-pay | Admitting: Physician Assistant

## 2022-08-30 NOTE — Therapy (Signed)
OUTPATIENT OCCUPATIONAL THERAPY TREATMENT NOTE  Patient Name: Joseph Patrick MRN: [id number] DOB:1946/06/07, 76 y.o., male Today's Date: 08/31/2022  PCP: Arthur Holms, NP REFERRING PROVIDER: Leandrew Koyanagi, MD     OT End of Session - 08/31/22 1027     Visit Number 14    Number of Visits 24    Date for OT Re-Evaluation 10/12/21    Authorization Type [employer]    Progress Note Due on Visit 18    OT Start Time 1020    OT Stop Time 1059    OT Time Calculation (min) 39 min    Activity Tolerance Patient tolerated treatment well;Patient limited by fatigue;Patient limited by pain    Behavior During Therapy [facility] for tasks assessed/performed              Past Medical History:  Diagnosis Date   CVA (cerebral vascular accident) [facility])    april 2023   Diabetes mellitus without complication ([place])    Hypertension    History reviewed. No pertinent surgical history. 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 ([place]) 01/14/2022   HFrEF (heart failure with reduced ejection fraction) ([place])    Essential hypertension 01/07/2022   Hyperlipidemia 01/07/2022   Diabetes ([place]) 01/07/2022   Stage 3a chronic kidney disease (CKD) ([place]) 01/07/2022   Transient cerebral ischemia 01/06/2022    ONSET DATE:  July 16th, 2023 (about 12 weeks since injury)   REFERRING DIAG: M25.531 (ICD-10-CM) - Pain in right wrist  THERAPY DIAG:  Muscle weakness (generalized)  Other lack of coordination  Unsteadiness on feet  Acute ischemic left MCA stroke (HCC)  Spastic hemiplegia of right dominant side as late effect of cerebral infarction (HCC)  Stiffness of right wrist, not elsewhere classified  Localized edema  Lacunar stroke (HCC)  Chronic right shoulder pain  Rationale for Evaluation and Treatment Rehabilitation  PERTINENT HISTORY: Per medical notes, he  fell ~ 2 months ago, tearing (complete supraspinatus) Rt RTC, tore Rt TFCC as well as S-L ligament.  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 He states falling ~2 months ago and wrist bend into flexion and he tore ligaments in his wrist. He states his right hand is now swollen and stiff. He also states having a stroke this April (about 6 months ago) that affected Rt side, but he states rehabbing back to "normal" before fall. He states no significant pain at fall and none now- just swollen, tight, stiff and also poorer coordination now. He states not wearing any wrist brace after fall, tried a sling for his shoulder, which he felt "made him worse," so d/c'd it.   His niece is with him today to support him."  PRECAUTIONS: Fall and Other: Rt RTC tears   WEIGHT BEARING RESTRICTIONS Yes caution for Rt Shoulder and wrist   SUBJECTIVE:   SUBJECTIVE STATEMENT: Pt states he just wants to work on his legs and the left side of his body. He thinks his arm just is how it will be.  PAIN:  Are you having pain?  1/10 R shoulder pain    FALLS: Has patient fallen in last 6 months? Yes. Number of falls 1 - this injury  LIVING ENVIRONMENT: Lives with: Alone. He states driving since stroke. He uses a cane for stability.    OBJECTIVE: (All objective assessments below are  from initial evaluation on: 06/28/22 unless otherwise specified.)    HAND DOMINANCE: Right   ADLs: Overall ADLs: He states problems with dressing, bathing, turning door handles, and FMS buttoning with Rt hand now.   FUNCTIONAL OUTCOME MEASURES: 07/24/22: PRWE: Pain: 3/50; Function: 18.5/40; Total Score: 21.5/90 (Higher Score  =  More Pain and/or Debility)   Eval: Patient Rated Wrist Evaluation (PRWE) (modified to exclude work and recreational activities as he states not doing at baseline): Pain: 0/50; Function: 19.5/40; Total Score: 19.5/90 (Higher Score  =  More Pain and/or Debility)     UPPER EXTREMITY ROM     Shoulder to Wrist AROM Right eval Left eval Rt 07/10/22 Rt 07/24/22 Rt  12/12  Forearm supination 63   56 84  Forearm pronation  80   83 84  Wrist flexion 37* (tender) (40* PROM)  50* 47 35 54  Wrist extension 28* (40* PROM)  68* 36 42 44  Wrist ulnar deviation 7*  19 25   Wrist radial deviation 5*  14 15   Functional dart thrower's motion (F-DTM) in ulnar flexion 25*      F-DTM in radial extension  27*      (Blank rows = not tested)   Hand AROM Right eval Right 07/24/22  Full Fist Ability (or Gap to Distal Palmar Crease) Loose fist (fingers barely touch palm Still can touch palm with all fingers with effort  Thumb Opposition to Small Finger (or Gap) 1.5cm gap and slow motion ~1.5 - 2cm gap to oppose  Thumb Opposition to Base of Small Finger (or Gap)  unable unable  (Blank rows = not tested)   UPPER EXTREMITY MMT:    Eval:  NT at eval due to concern for ligament tears, however he was stable and non-painful and this will be tested next session to help "tease out" CVA type symptoms as well. Definite weakness Rt vs Lt.   MMT Right 07/03/22 Rt 07/24/22  Elbow flexion 5/5 good tone, poor endurance 4+/5 MMT  Elbow extension 4-/5 lower tone 4+/5 MMT  Forearm supination 4+/5 good tone 4+/5 MMT  Forearm pronation 4+/5 good tone 5/5 MMY  Wrist flexion 4+/5 4+/5 MMT  Wrist/finger extension 3+/5 4-/5 MMT  (Blank rows = not tested)  HAND FUNCTION: 07/24/22: Rt grip today: 25.7#  07/03/22: Rt 19# grip (no pain); Lt: 118#    COORDINATION: 07/24/22: Box & Blocks Test Right 33 Blocks Today (46 is WFL)  9HPT: 9HPT: Rt 57mn 7sec   07/03/22: Box & Blocks Test Right 21 Blocks Today (46 is WFL)   Eval: He has observed ataxic motion with finger to nose test, observed slow and poor FMS in Rt hand compared to Lt.  9HPT: Rt 150m 46sec with several pegs dropped.   SENSATION: Eval:  Light touch intact today, he states equal b/l   EDEMA:   07/03/22: Rt hand  figure of 8:   48.7cm today  (Lt hand is 48.8cm);  Rt IF base 7.5cm (Lt hand is 7.2cm)   COGNITION: 07/24/22: more overt memory issues since start of care, he can't recall doing box and blocks before, he forgets mentioning lateral shoulder pain, seems to discuss the same things over during each session, etc.   Eval: Overall [facility], though states some memory issues  OBSERVATIONS:   Eval: no facial droop, but Rt sided ataxia present in UE and LE, poor tone and coordination noted as well.  Concern for exacerbation of CVA as main  issue vs ligament tears and orthopedic issues.  (Rt wrist seems stable, negative Watson's Test, no DRUJ instability, negative TFCC shear test)   TODAY'S TREATMENT:   - Therapeutic exercises completed for duration as noted below including:  Pt placed BUE in Fluidotherapy machine with supervised ROM x 10 min. Pt was educated to complete R PROM during modality time to improve ROM and decrease pain/stiffness of affected extremity by use of the machine's massaging action and thermal properties.     OT initiated red theraputty exercises (search, grip, pinch) as noted in patient instructions for coordination and strength  Exercises: - Seated Shoulder External Rotation PROM on Table  - 3-4 x daily - 3-5 reps - 15-20 sec hold - Sleeper Stretch  - 3-4 x daily - 5 reps - 15-20 sec hold - Seated Single Arm Shoulder PNF D1 Flexion  - 4-6 x daily - 10-15 reps - Wrist Flexion Stretch  - 4 x daily - 3-5 reps - 15 sec hold - Wrist Extension Stretch Pronated  - 4 x daily - 3-5 reps - 15 hold - Bend and Pull Back Wrist SLOWLY  - 4 x daily - 10-15 reps - Tendon Glides  - 4-6 x daily - 3-5 reps - 2-3 seconds hold - Thumb Opposition  - 4-6 x daily - 10 reps - Seated Single Finger Extension  - 4-6 x daily - 10-15 reps - Full Fist  - 2-3 x daily - 5 reps - Finger Extension "Pizza!"   - 2-3 x daily - 5 reps - Finger Pinch and Pull with Putty  - 2-3 x  daily - 5 reps - Wrist Extension with Resistance  - 2-4 x daily - 1-2 sets - 10-15 reps - Wrist Flexion with Resistance  - 2-4 x daily - 1-2 sets - 10-15 reps   PATIENT EDUCATION: Education details: See tx section above for details  Person educated: Patient; niece Education method: Verbal Instruction, Teach back, Handouts  Education comprehension: States and demonstrates understanding, Additional Education required    HOME EXERCISE PROGRAM: Access Code: [email or web address] URL: [email or web address]    GOALS: Goals reviewed with patient? Yes   SHORT TERM GOALS: (STG required if POC>30 days) Target Date: 07/13/22  Pt will obtain protective, custom orthotic. Goal status: D/C he doesn't need at this point   2.  Pt will demo/state understanding of initial HEP to improve pain levels and prerequisite motion. Goal status: 07/24/22: MET but he cannot recall most today    LONG TERM GOALS: Target Date: 09/07/22  Pt will improve functional ability by decreased impairment per PRWE functional assessment from 19.5/40 to 10/40 or better, for better quality of life. Goal status: 07/24/22: 18.5 today, slightly better, admits to favoring Lt hand and not using right hand at times.   2.  Pt will improve grip strength in Rt hand to at least 40lbs for functional use at home and in IADLs. Goal status:  07/24/22: 25# today 12/15 - 25.5 today  3.  Pt will improve A/ROM in Rt wrist flex & ext from 37/28* respectively, to at least 40* both, to have functional motion for tasks like reach and grasp.  Goal status:  MET 07/24/22: now 35* / 42* respectively  12/12 - 54/44*  4.  Pt will improve strength in Rt wrist flex, ext to at least 4+/5 MMT to have increased functional ability to carry out selfcare and higher-level homecare tasks with no difficulty. Goal status: 07/24/22: now 4+/5 flexion, but 4-/5 ext  5.  Pt will improve coordination skills in Rt hand, as seen by better score on 9HPT testing to  at least 55sec to have increased functional ability to carry out fine motor tasks (fasteners, etc.) and more complex, coordinated IADLs (meal prep, sports, etc.).  Goal status: 07/24/22: today 67 sec     ASSESSMENT:  CLINICAL IMPRESSION: Pt continues to benefit from skilled OT services to improve edema, ROM, strength, and coordination in RUE as needed to complete ADLs and IADLs more independently and safely. He will require extension of services past original POC due to limitations with cognition and carryover.   PLAN: OT FREQUENCY: 2x/week   OT DURATION: 24 visits through 10/12/2022   PLANNED INTERVENTIONS: self care/ADL training, therapeutic exercise, therapeutic activity, neuromuscular re-education, manual therapy, passive range of motion, functional mobility training, splinting, electrical stimulation, ultrasound, fluidotherapy, compression bandaging, moist heat, cryotherapy, contrast bath, patient/family education, and coping strategies training  RECOMMENDED OTHER SERVICES: is getting PT for shoulder tears right now. PT was recommended to assess and tx for LE weakness/stroke signs as well. He was also recommended to see cardiologist/neurologist about possible stroke evolution.   CONSULTED AND AGREED WITH PLAN OF CARE: Patient and family member/caregiver  PLAN FOR NEXT SESSION:  Work toward LTG - review red putty HEP; red theraband elbow ext, wrist flex and extension  Murlean Caller, OTR/L 08/31/2022 [place] Phone: [phone] Fax: [phone]

## 2022-08-31 ENCOUNTER — Encounter: Payer: Self-pay | Admitting: Occupational Therapy

## 2022-08-31 ENCOUNTER — Ambulatory Visit: Payer: Medicare HMO | Admitting: Physical Therapy

## 2022-08-31 ENCOUNTER — Encounter: Payer: Self-pay | Admitting: Physical Therapy

## 2022-08-31 ENCOUNTER — Ambulatory Visit: Payer: Medicare HMO | Admitting: Occupational Therapy

## 2022-08-31 VITALS — BP 139/62 | HR 64

## 2022-08-31 DIAGNOSIS — G8929 Other chronic pain: Secondary | ICD-10-CM

## 2022-08-31 DIAGNOSIS — R2681 Unsteadiness on feet: Secondary | ICD-10-CM

## 2022-08-31 DIAGNOSIS — M25631 Stiffness of right wrist, not elsewhere classified: Secondary | ICD-10-CM

## 2022-08-31 DIAGNOSIS — I6381 Other cerebral infarction due to occlusion or stenosis of small artery: Secondary | ICD-10-CM

## 2022-08-31 DIAGNOSIS — R278 Other lack of coordination: Secondary | ICD-10-CM

## 2022-08-31 DIAGNOSIS — I63512 Cerebral infarction due to unspecified occlusion or stenosis of left middle cerebral artery: Secondary | ICD-10-CM

## 2022-08-31 DIAGNOSIS — I69351 Hemiplegia and hemiparesis following cerebral infarction affecting right dominant side: Secondary | ICD-10-CM

## 2022-08-31 DIAGNOSIS — M6281 Muscle weakness (generalized): Secondary | ICD-10-CM | POA: Diagnosis not present

## 2022-08-31 DIAGNOSIS — R600 Localized edema: Secondary | ICD-10-CM

## 2022-08-31 NOTE — Therapy (Signed)
OUTPATIENT PHYSICAL THERAPY NEURO TREATMENT   Patient Name: Joseph Patrick MRN: [id number] DOB:March 02, 1946, 76 y.o., male Today's Date: 08/31/2022   PCP: Arthur Holms NP REFERRING PROVIDER: Garvin Fila, MD   PT End of Session - 08/31/22 1104     Visit Number 11    Number of Visits 21   recert   Date for PT Re-Evaluation 09/27/22   to allow for scheduling conflicts   Authorization Type [employer]    Authorization Time Period 08/16/22-09/27/22    Progress Note Due on Visit 10    PT Start Time 1104    PT Stop Time 1145    PT Time Calculation (min) 41 min    Equipment Utilized During Treatment Gait belt    Activity Tolerance Patient tolerated treatment well;Patient limited by fatigue;Patient limited by pain    Behavior During Therapy [facility] for tasks assessed/performed                 Physical Therapy Progress Note   Dates of Reporting Period:07/20/22 - 08/28/22  See Note below for Objective Data and Assessment of Progress/Goals.  Thank you for the referral of this patient. Excell Seltzer, PT, DPT, CSRS    Past Medical History:  Diagnosis Date   CVA (cerebral vascular accident) [facility])    april 2023   Diabetes mellitus without complication ([place])    Hypertension    History reviewed. No pertinent surgical history. 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 ([place]) 01/14/2022   HFrEF (heart failure with reduced ejection fraction) ([place])    Essential hypertension 01/07/2022   Hyperlipidemia 01/07/2022   Diabetes ([place]) 01/07/2022   Stage 3a chronic kidney disease (CKD) ([place]) 01/07/2022   Transient cerebral ischemia 01/06/2022    ONSET DATE: 07/12/2022  REFERRING DIAG: E17.408 (ICD-10-CM) - Spastic hemiplegia of right dominant side as late effect of cerebral infarction (HCC) I63.81 (ICD-10-CM) - Lacunar stroke (HCC)  THERAPY DIAG:   Muscle weakness (generalized)  Other lack of coordination  Unsteadiness on feet  Spastic hemiplegia of right dominant side as late effect of cerebral infarction [facility])  Rationale for Evaluation and Treatment: Rehabilitation  SUBJECTIVE:                                                                                                                                                                                             SUBJECTIVE STATEMENT: Pt states right hemibody cramping is still intermittent but getting better.  He walked up and down 40 stairs yesterday and 20 today  without issue.  He feels his right thigh muscle is getting stronger because he is doing SLRs and notices less shaking at end of ROM.  Pt accompanied by: family member (niece [name])  PERTINENT HISTORY: hypertension, hyperlipidemia, diabetes mellitus, CKD stage III, CHF with ejection fraction of 25 to 30%, medical noncompliance, recent CVA left hemispheric subcortical  PAIN:  Are you having pain? No  VITALS (LUE in Sitting): Vitals:   08/31/22 1107  BP: 139/62  Pulse: 64      PRECAUTIONS: Fall  WEIGHT BEARING RESTRICTIONS: No  FALLS: Has patient fallen in last 6 months? Yes. Number of falls 1 in August that led to R shoulder injury (tear in rotator cuff muscles)  LIVING ENVIRONMENT: Lives with: lives alone Lives in: House/apartment Stairs: No (can use elevator) Has following equipment at home: Single point cane  PLOF: Independent with gait, Independent with transfers, and Requires assistive device for independence  PATIENT GOALS: "strengthen my right leg and my right arm" "I want to be able to swing a golf club with RUE"  OBJECTIVE:  Standing wide stance on airex w/ right to left trunk rotation using RUE to retrieve bean bag from below waist height and LUE to throw underhand and overhand, pt attempts several throws w/ RUE causing some shoulder tightness, PT encourages use of LUE for throwing task  only STS in stride x8, edu to try this at home and prevent right knee valgus and left weight shift on intiation Standing narrowed BOS on airex (feet not together) w/ red star ball taps to 8" cones in 3 directions Y balance in shallow ROM w/ BUE support in // bars, cues for corrected form and engagement to task NuStep L5 using only LUE x81mns for cardiovascular endurance, pt verbally combative with PT regarding use of RUE expressing desire to use arm, but wincing w/ attempts w/ PT providing extensive edu on not using arm during this task to prevent any further issues.  PATIENT EDUCATION: Education details: Continue walking regularly and  Person educated: Patient and niece ALevada DyEducation method: ECustomer service managerEducation comprehension: verbalized understanding  HOME EXERCISE PROGRAM: Access Code: [email or web address]: [email or web address] Date: 07/25/2022 Prepared by: MElease Etienne Exercises - Staggered Sit-to-Stand  - 1 x daily - 4-5 x weekly - 2-3 sets - 10 reps - Standing Tandem Balance with Counter Support  - 1 x daily - 5 x weekly - 1 sets - 2-3 reps - 30-45 seconds hold - Tandem Walking with Counter Support  - 1 x daily - 5-6 x weekly - 3 sets - 10 reps - Mini Squat with Counter Support  - 1 x daily - 7 x weekly - 3 sets - 10 reps - Standing 3-Way Leg Reach with Resistance at Ankles and Counter Support  - 1 x daily - 7 x weekly - 3 sets - 10 reps - Supine Bridge with Mini Swiss Ball Between Knees  - 1 x daily - 7 x weekly - 3 sets - 10 reps - Forward Step Up with Counter Support  - 1 x daily - 7 x weekly - 3 sets - 10 reps - Supine Heel Slide  - 1 x daily - 7 x weekly - 3 sets - 10 reps - Hooklying Single Leg Bent Knee Fallouts with Resistance  - 1 x daily - 7 x weekly - 3 sets - 10 reps   GOALS: Goals reviewed with patient? Yes  SHORT TERM GOALS=LONG-TERM GOALS due to length of POC  LONG TERM  GOALS: Target date: 08/17/2022  Pt will be independent  with final HEP for improved strength, balance, transfers and gait. Baseline:  Goal status: IN PROGRESS  2.  Pt will demonstrate TUG of </=13 seconds in order to decrease risk of falls and improve functional mobility using LRAD. Baseline: 14.35 sec w/ SPC and R AFO, 14.18 sec with SPC and R AFO (11/30) Goal status: IN PROGRESS  3.  Pt will improve FGA score to >/=20/30 in order to demonstrate improved balance and decreased fall risk. Baseline: 14/30 no AD, 17/30 with SPC (11/30) Goal status: IN PROGRESS  4.  Pt will demonstrate a gait speed of >/=3.0 feet/sec using LRAD and AFO in order to decrease risk for falls. Baseline: 2.65 ft/sec w/ SPC and R AFO, 1.91 ft/sec with SPC and AFO (11/30) Goal status: NOT MET   NEW LONG TERM GOALS: Target date: 09/20/2022  Pt will be independent with final HEP for improved strength, balance, transfers and gait. Baseline:  Goal status: IN PROGRESS  2.  Pt will demonstrate TUG of </=13 seconds in order to decrease risk of falls and improve functional mobility using LRAD. Baseline: 14.35 sec w/ SPC and R AFO, 14.18 sec with SPC and R AFO (11/30) Goal status: IN PROGRESS  3.  Pt will improve FGA score to >/=20/30 in order to demonstrate improved balance and decreased fall risk. Baseline: 14/30 no AD, 17/30 with SPC (11/30) Goal status: IN PROGRESS  4.  Pt will demonstrate a gait speed of >/=2.75 feet/sec using LRAD and AFO in order to decrease risk for falls. Baseline: 2.65 ft/sec w/ SPC and R AFO, 1.91 ft/sec with SPC and AFO (11/30) Goal status: REVISED   ASSESSMENT:  CLINICAL IMPRESSION: Pt having an off day this visit also endorsed by niece.  He verbalized and demonstrated fatigue throughout session which somewhat limited progressions this visit.  Focused on continuing with static balance tasks and functional strength.  Will continue POC.    OBJECTIVE IMPAIRMENTS: Abnormal gait, decreased balance, decreased coordination, decreased endurance,  decreased mobility, and decreased strength.   ACTIVITY LIMITATIONS: carrying, lifting, bending, squatting, stairs, and transfers  PARTICIPATION LIMITATIONS: community activity  PERSONAL FACTORS: Age and 1-2 comorbidities:    hypertension, hyperlipidemia, diabetes mellitus, CKD stage III, CHF with ejection fraction of 25 to 30%, medical noncompliance, recent CVA left hemispheric subcortical region are also affecting patient's functional outcome.   REHAB POTENTIAL: Good  CLINICAL DECISION MAKING: Stable/uncomplicated  EVALUATION COMPLEXITY: Low  PLAN:  PT FREQUENCY: 2x/week  PT DURATION: 6 weeks + 12 visits (recert)  PLANNED INTERVENTIONS: Therapeutic exercises, Therapeutic activity, Neuromuscular re-education, Balance training, Gait training, Patient/Family education, Self Care, Joint mobilization, Stair training, Vestibular training, Canalith repositioning, Visual/preceptual remediation/compensation, Orthotic/Fit training, DME instructions, Aquatic Therapy, Dry Needling, Electrical stimulation, Cryotherapy, Moist heat, Taping, Manual therapy, and Re-evaluation  PLAN FOR NEXT SESSION: check L BP!, RLE NMR, Hip flexor and HS strengthening, lateral weight shifting, increased step length w/gait, intro basic dual-tasks    Bary Richard, PT, DPT 08/31/2022, 12:24 PM

## 2022-09-04 ENCOUNTER — Ambulatory Visit: Payer: Medicare HMO | Admitting: Occupational Therapy

## 2022-09-04 ENCOUNTER — Ambulatory Visit: Payer: Medicare HMO | Admitting: Physical Therapy

## 2022-09-04 ENCOUNTER — Encounter: Payer: Self-pay | Admitting: Physical Therapy

## 2022-09-04 VITALS — BP 145/67 | HR 75

## 2022-09-04 DIAGNOSIS — G8929 Other chronic pain: Secondary | ICD-10-CM

## 2022-09-04 DIAGNOSIS — M6281 Muscle weakness (generalized): Secondary | ICD-10-CM

## 2022-09-04 DIAGNOSIS — R2681 Unsteadiness on feet: Secondary | ICD-10-CM

## 2022-09-04 DIAGNOSIS — I69351 Hemiplegia and hemiparesis following cerebral infarction affecting right dominant side: Secondary | ICD-10-CM

## 2022-09-04 DIAGNOSIS — R278 Other lack of coordination: Secondary | ICD-10-CM

## 2022-09-04 DIAGNOSIS — M25631 Stiffness of right wrist, not elsewhere classified: Secondary | ICD-10-CM

## 2022-09-04 NOTE — Therapy (Signed)
OUTPATIENT PHYSICAL THERAPY NEURO TREATMENT   Patient Name: Joseph Patrick MRN: [id number] DOB:November 30, 1945, 76 y.o., male Today's Date: 09/04/2022   PCP: Arthur Holms NP REFERRING PROVIDER: Garvin Fila, MD   PT End of Session - 09/04/22 1403     Visit Number 12    Number of Visits 21   recert   Date for PT Re-Evaluation 09/27/22   to allow for scheduling conflicts   Authorization Type [employer]    Authorization Time Period 08/16/22-09/27/22    Progress Note Due on Visit 10    PT Start Time 1406   received from OT   PT Stop Time 1447    PT Time Calculation (min) 41 min    Equipment Utilized During Treatment Gait belt    Activity Tolerance Patient tolerated treatment well    Behavior During Therapy [facility] for tasks assessed/performed                 Physical Therapy Progress Note   Dates of Reporting Period:07/20/22 - 08/28/22  See Note below for Objective Data and Assessment of Progress/Goals.  Thank you for the referral of this patient. Excell Seltzer, PT, DPT, CSRS    Past Medical History:  Diagnosis Date   CVA (cerebral vascular accident) [facility])    april 2023   Diabetes mellitus without complication ([place])    Hypertension    History reviewed. No pertinent surgical history. 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 ([place]) 01/14/2022   HFrEF (heart failure with reduced ejection fraction) ([place])    Essential hypertension 01/07/2022   Hyperlipidemia 01/07/2022   Diabetes ([place]) 01/07/2022   Stage 3a chronic kidney disease (CKD) ([place]) 01/07/2022   Transient cerebral ischemia 01/06/2022    ONSET DATE: 07/12/2022  REFERRING DIAG: T59.741 (ICD-10-CM) - Spastic hemiplegia of right dominant side as late effect of cerebral infarction (HCC) I63.81 (ICD-10-CM) - Lacunar stroke (HCC)  THERAPY DIAG:  Muscle weakness  (generalized)  Other lack of coordination  Unsteadiness on feet  Spastic hemiplegia of right dominant side as late effect of cerebral infarction (HCC)  Chronic right shoulder pain  Rationale for Evaluation and Treatment: Rehabilitation  SUBJECTIVE:                                                                                                                                                                                             SUBJECTIVE STATEMENT: Pt denies falls since last visit.  He did not eat lunch with his most recent medicine dose and feels  a bit run down.  Pt accompanied by: family member (niece [name])  PERTINENT HISTORY: hypertension, hyperlipidemia, diabetes mellitus, CKD stage III, CHF with ejection fraction of 25 to 30%, medical noncompliance, recent CVA left hemispheric subcortical  PAIN:  Are you having pain? No  VITALS (LUE in Sitting): Vitals:   09/04/22 1410  BP: (!) 145/67  Pulse: 75       PRECAUTIONS: Fall  WEIGHT BEARING RESTRICTIONS: No  FALLS: Has patient fallen in last 6 months? Yes. Number of falls 1 in August that led to R shoulder injury (tear in rotator cuff muscles)  LIVING ENVIRONMENT: Lives with: lives alone Lives in: House/apartment Stairs: No (can use elevator) Has following equipment at home: Single point cane  PLOF: Independent with gait, Independent with transfers, and Requires assistive device for independence  PATIENT GOALS: "strengthen my right leg and my right arm" "I want to be able to swing a golf club with RUE"  OBJECTIVE:  Blaze pods in circle formation for motor planning and reaction time, using RLE to tap lights, 11 > 17 > 19 hits, pt requires prolonged rest due to RLE fatigue between sets Forwards/backwards/lateral stepping direction changing w/ called direction by therapist ambulating around clinic LLE step up onto airex maintaining stride w/ LUE bean bag toss to midline floor target for stride balance and SLS  on RLE Walking forward eyes closed at counter Fwd/backwards tandem walking at counter, pt fatigues w/ backwards portion reporting it is more difficult and he feels this in his hips more  PATIENT EDUCATION: Education details: Sequencing for safety w/ stairs vs strengthening using UE support as pt continues to walk several flights of stairs per day.  Edu on maintaining nutrition, medical regimen, and taking rest when needed to prevent fatigue and promote healing.  Continue HEP for balance. Person educated: Patient and niece [name] Education method: Customer service manager Education comprehension: verbalized understanding  HOME EXERCISE PROGRAM: Access Code: [email or web address] URL: [email or web address] Date: 07/25/2022 Prepared by: Elease Etienne  Exercises - Staggered Sit-to-Stand  - 1 x daily - 4-5 x weekly - 2-3 sets - 10 reps - Standing Tandem Balance with Counter Support  - 1 x daily - 5 x weekly - 1 sets - 2-3 reps - 30-45 seconds hold - Tandem Walking with Counter Support  - 1 x daily - 5-6 x weekly - 3 sets - 10 reps - Mini Squat with Counter Support  - 1 x daily - 7 x weekly - 3 sets - 10 reps - Standing 3-Way Leg Reach with Resistance at Ankles and Counter Support  - 1 x daily - 7 x weekly - 3 sets - 10 reps - Supine Bridge with Mini Swiss Ball Between Knees  - 1 x daily - 7 x weekly - 3 sets - 10 reps - Forward Step Up with Counter Support  - 1 x daily - 7 x weekly - 3 sets - 10 reps - Supine Heel Slide  - 1 x daily - 7 x weekly - 3 sets - 10 reps - Hooklying Single Leg Bent Knee Fallouts with Resistance  - 1 x daily - 7 x weekly - 3 sets - 10 reps   GOALS: Goals reviewed with patient? Yes  SHORT TERM GOALS=LONG-TERM GOALS due to length of POC  LONG TERM GOALS: Target date: 08/17/2022  Pt will be independent with final HEP for improved strength, balance, transfers and gait. Baseline:  Goal status: IN PROGRESS  2.  Pt will demonstrate TUG of </=13  seconds in  order to decrease risk of falls and improve functional mobility using LRAD. Baseline: 14.35 sec w/ SPC and R AFO, 14.18 sec with SPC and R AFO (11/30) Goal status: IN PROGRESS  3.  Pt will improve FGA score to >/=20/30 in order to demonstrate improved balance and decreased fall risk. Baseline: 14/30 no AD, 17/30 with SPC (11/30) Goal status: IN PROGRESS  4.  Pt will demonstrate a gait speed of >/=3.0 feet/sec using LRAD and AFO in order to decrease risk for falls. Baseline: 2.65 ft/sec w/ SPC and R AFO, 1.91 ft/sec with SPC and AFO (11/30) Goal status: NOT MET   NEW LONG TERM GOALS: Target date: 09/20/2022  Pt will be independent with final HEP for improved strength, balance, transfers and gait. Baseline:  Goal status: IN PROGRESS  2.  Pt will demonstrate TUG of </=13 seconds in order to decrease risk of falls and improve functional mobility using LRAD. Baseline: 14.35 sec w/ SPC and R AFO, 14.18 sec with SPC and R AFO (11/30) Goal status: IN PROGRESS  3.  Pt will improve FGA score to >/=20/30 in order to demonstrate improved balance and decreased fall risk. Baseline: 14/30 no AD, 17/30 with SPC (11/30) Goal status: IN PROGRESS  4.  Pt will demonstrate a gait speed of >/=2.75 feet/sec using LRAD and AFO in order to decrease risk for falls. Baseline: 2.65 ft/sec w/ SPC and R AFO, 1.91 ft/sec with SPC and AFO (11/30) Goal status: REVISED   ASSESSMENT:  CLINICAL IMPRESSION: Pt appears to be in better spirits this visit and less fatigued than prior.  Continued work on direction changing and increasing reaction time both with and without use of SPC.  Further addressed SLS on RLE w/ dynamic balance task in and out of stride stance.  He continues to require further work on right quad control preventing excessive extension and promoting tibial translation to improve quality of stance and ambulation.   OBJECTIVE IMPAIRMENTS: Abnormal gait, decreased balance, decreased coordination,  decreased endurance, decreased mobility, and decreased strength.   ACTIVITY LIMITATIONS: carrying, lifting, bending, squatting, stairs, and transfers  PARTICIPATION LIMITATIONS: community activity  PERSONAL FACTORS: Age and 1-2 comorbidities:    hypertension, hyperlipidemia, diabetes mellitus, CKD stage III, CHF with ejection fraction of 25 to 30%, medical noncompliance, recent CVA left hemispheric subcortical region are also affecting patient's functional outcome.   REHAB POTENTIAL: Good  CLINICAL DECISION MAKING: Stable/uncomplicated  EVALUATION COMPLEXITY: Low  PLAN:  PT FREQUENCY: 2x/week  PT DURATION: 6 weeks + 12 visits (recert)  PLANNED INTERVENTIONS: Therapeutic exercises, Therapeutic activity, Neuromuscular re-education, Balance training, Gait training, Patient/Family education, Self Care, Joint mobilization, Stair training, Vestibular training, Canalith repositioning, Visual/preceptual remediation/compensation, Orthotic/Fit training, DME instructions, Aquatic Therapy, Dry Needling, Electrical stimulation, Cryotherapy, Moist heat, Taping, Manual therapy, and Re-evaluation  PLAN FOR NEXT SESSION: check L BP!, RLE NMR, Hip flexor and HS strengthening, lateral weight shifting, increased step length w/gait, intro basic dual-tasks, reaction time, SLS on RLE   Bary Richard, PT, DPT 09/04/2022, 4:25 PM

## 2022-09-04 NOTE — Therapy (Signed)
OUTPATIENT OCCUPATIONAL THERAPY TREATMENT NOTE  Patient Name: Joseph Patrick MRN: [id number] DOB:1946/08/23, 76 y.o., male Today's Date: 09/04/2022  PCP: Arthur Holms, NP REFERRING PROVIDER: Leandrew Koyanagi, MD     OT End of Session - 09/04/22 1321     Visit Number 15    Number of Visits 24    Date for OT Re-Evaluation 10/12/21    Authorization Type [employer]    Progress Note Due on Visit 18    OT Start Time 1317    OT Stop Time 1400    OT Time Calculation (min) 43 min    Activity Tolerance Patient tolerated treatment well;Patient limited by fatigue;Patient limited by pain    Behavior During Therapy [facility] for tasks assessed/performed              Past Medical History:  Diagnosis Date   CVA (cerebral vascular accident) [facility])    april 2023   Diabetes mellitus without complication ([place])    Hypertension    No past surgical history on file. 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 ([place]) 01/14/2022   HFrEF (heart failure with reduced ejection fraction) ([place])    Essential hypertension 01/07/2022   Hyperlipidemia 01/07/2022   Diabetes ([place]) 01/07/2022   Stage 3a chronic kidney disease (CKD) ([place]) 01/07/2022   Transient cerebral ischemia 01/06/2022    ONSET DATE:  July 16th, 2023 (about 12 weeks since injury)   REFERRING DIAG: M25.531 (ICD-10-CM) - Pain in right wrist  THERAPY DIAG:  Muscle weakness (generalized)  Other lack of coordination  Spastic hemiplegia of right dominant side as late effect of cerebral infarction (HCC)  Stiffness of right wrist, not elsewhere classified  Rationale for Evaluation and Treatment Rehabilitation  PERTINENT HISTORY: Per medical notes, he fell ~ 2 months ago, tearing (complete supraspinatus) Rt RTC, tore Rt TFCC as well as S-L ligament.  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 He states falling ~2 months ago and wrist bend into flexion and he tore ligaments in his wrist. He states his right hand is now swollen and stiff. He also states having a stroke this April (about 6 months ago) that affected Rt side, but he states rehabbing back to "normal" before fall. He states no significant pain at fall and none now- just swollen, tight, stiff and also poorer coordination now. He states not wearing any wrist brace after fall, tried a sling for his shoulder, which he felt "made him worse," so d/c'd it.   His niece is with him today to support him."  PRECAUTIONS: Fall and Other: Rt RTC tears   WEIGHT BEARING RESTRICTIONS Yes caution for Rt Shoulder and wrist   SUBJECTIVE:   SUBJECTIVE STATEMENT: Pt states he feels the foot is getting better. I don't really care about the arm. I just want to be able to walk better  PAIN:  Are you having pain?  No  FALLS: Has patient fallen in last 6 months? Yes. Number of falls 1 - this injury  LIVING ENVIRONMENT: Lives with: Alone. He states driving since stroke. He uses a cane for stability.    OBJECTIVE: (All objective assessments below are from initial evaluation on: 06/28/22 unless otherwise specified.)    HAND DOMINANCE: Right   ADLs: Overall ADLs: He states problems with dressing, bathing, turning door handles, and FMS  buttoning with Rt hand now.   FUNCTIONAL OUTCOME MEASURES: 07/24/22: PRWE: Pain: 3/50; Function: 18.5/40; Total Score: 21.5/90 (Higher Score  =  More Pain and/or Debility)   Eval: Patient Rated Wrist Evaluation (PRWE) (modified to exclude work and recreational activities as he states not doing at baseline): Pain: 0/50; Function: 19.5/40; Total Score: 19.5/90 (Higher Score  =  More Pain and/or Debility)    UPPER EXTREMITY ROM     Shoulder to Wrist AROM Right eval Left eval Rt 07/10/22 Rt 07/24/22 Rt  12/12  Forearm supination 63   56 84  Forearm pronation  80   83 84   Wrist flexion 37* (tender) (40* PROM)  50* 47 35 54  Wrist extension 28* (40* PROM)  68* 36 42 44  Wrist ulnar deviation 7*  19 25   Wrist radial deviation 5*  14 15   Functional dart thrower's motion (F-DTM) in ulnar flexion 25*      F-DTM in radial extension  27*      (Blank rows = not tested)   Hand AROM Right eval Right 07/24/22  Full Fist Ability (or Gap to Distal Palmar Crease) Loose fist (fingers barely touch palm Still can touch palm with all fingers with effort  Thumb Opposition to Small Finger (or Gap) 1.5cm gap and slow motion ~1.5 - 2cm gap to oppose  Thumb Opposition to Base of Small Finger (or Gap)  unable unable  (Blank rows = not tested)   UPPER EXTREMITY MMT:    Eval:  NT at eval due to concern for ligament tears, however he was stable and non-painful and this will be tested next session to help "tease out" CVA type symptoms as well. Definite weakness Rt vs Lt.   MMT Right 07/03/22 Rt 07/24/22  Elbow flexion 5/5 good tone, poor endurance 4+/5 MMT  Elbow extension 4-/5 lower tone 4+/5 MMT  Forearm supination 4+/5 good tone 4+/5 MMT  Forearm pronation 4+/5 good tone 5/5 MMY  Wrist flexion 4+/5 4+/5 MMT  Wrist/finger extension 3+/5 4-/5 MMT  (Blank rows = not tested)  HAND FUNCTION: 07/24/22: Rt grip today: 25.7#  07/03/22: Rt 19# grip (no pain); Lt: 118#    COORDINATION: 07/24/22: Box & Blocks Test Right 33 Blocks Today (46 is WFL)  9HPT: 9HPT: Rt 79mn 7sec   07/03/22: Box & Blocks Test Right 21 Blocks Today (46 is WFL)   Eval: He has observed ataxic motion with finger to nose test, observed slow and poor FMS in Rt hand compared to Lt.  9HPT: Rt 159m 46sec with several pegs dropped.   SENSATION: Eval:  Light touch intact today, he states equal b/l   EDEMA:   07/03/22: Rt hand figure of 8:   48.7cm today  (Lt hand is 48.8cm);  Rt IF base 7.5cm (Lt hand is 7.2cm)   COGNITION: 07/24/22: more overt memory issues since start of care, he can't recall doing  box and blocks before, he forgets mentioning lateral shoulder pain, seems to discuss the same things over during each session, etc.   Eval: Overall [facility] status[facility], though states some memory issues  OBSERVATIONS:   Eval: no facial droop, but Rt sided ataxia present in UE and LE, poor tone and coordination noted as well.  Concern for exacerbation of CVA as main issue vs ligament tears and orthopedic issues.  (Rt wrist seems stable, negative Watson's Test, no DRUJ instability, negative TFCC shear test)   TODAY'S TREATMENT:   (Niece present  for all below education to help with carryover at home) Focus for today's session was on positioning, correct way of performing ex's, ex's to avoid and ex's to encourage:  Reviewed proper technique for working low range sh extension and elbow extension w/ resistive band. Pt instructed to d/c bicep curls w/ band and sh abduction.  Instructed in low to mid level reaching w/ focus on preventing sh abduction and IR,  and encouraging elbow extension. Pt also encouraged to continue to perform higher level BUE ROM when supine to prevent frozen shoulder. Pt encouraged to perform ER w/o band d/t weakness and compensating into abduction. Reviewed wrist extension stretch as well  Pt encouraged to try and eat all finger foods and 50% using utensil with Rt dominant UE. Pt also encouraged to bathe majority of body with Rt hand if possible (using loofah for easier grasp).   Practiced writing name in print with built up pen w/ forearm and elbow supported on table for greater positioning, comfort, and preventing sh hiking.  Practiced picking up pennies and placing in coin bank, then stacking pennies (3 stacks of 5). Flipping cards over for finger extension and FA supination    Exercises: - Seated Shoulder External Rotation PROM on Table  - 3-4 x daily - 3-5 reps - 15-20 sec hold - Sleeper Stretch  - 3-4 x daily - 5 reps - 15-20 sec hold - Seated  Single Arm Shoulder PNF D1 Flexion  - 4-6 x daily - 10-15 reps - Wrist Flexion Stretch  - 4 x daily - 3-5 reps - 15 sec hold - Wrist Extension Stretch Pronated  - 4 x daily - 3-5 reps - 15 hold - Bend and Pull Back Wrist SLOWLY  - 4 x daily - 10-15 reps - Tendon Glides  - 4-6 x daily - 3-5 reps - 2-3 seconds hold - Thumb Opposition  - 4-6 x daily - 10 reps - Seated Single Finger Extension  - 4-6 x daily - 10-15 reps - Full Fist  - 2-3 x daily - 5 reps - Finger Extension "Pizza!"   - 2-3 x daily - 5 reps - Finger Pinch and Pull with Putty  - 2-3 x daily - 5 reps - Wrist Extension with Resistance  - 2-4 x daily - 1-2 sets - 10-15 reps - Wrist Flexion with Resistance  - 2-4 x daily - 1-2 sets - 10-15 reps   PATIENT EDUCATION: Education details: See tx section above for details  Person educated: Patient; niece Education method: Verbal Instruction, Teach back, Handouts  Education comprehension: States and demonstrates understanding, Additional Education required    HOME EXERCISE PROGRAM: Access Code: [email or web address] URL: [email or web address]    GOALS: Goals reviewed with patient? Yes   SHORT TERM GOALS: (STG required if POC>30 days) Target Date: 07/13/22  Pt will obtain protective, custom orthotic. Goal status: D/C he doesn't need at this point   2.  Pt will demo/state understanding of initial HEP to improve pain levels and prerequisite motion. Goal status: 07/24/22: MET but he cannot recall most today    LONG TERM GOALS: Target Date: 09/07/22  Pt will improve functional ability by decreased impairment per PRWE functional assessment from 19.5/40 to 10/40 or better, for better quality of life. Goal status: 07/24/22: 18.5 today, slightly better, admits to favoring Lt hand and not using right hand at times.   2.  Pt will improve grip strength in Rt hand to at least 40lbs for functional use at home and in  IADLs. Goal status:  07/24/22: 25# today 12/15 - 25.5 today  3.   Pt will improve A/ROM in Rt wrist flex & ext from 37/28* respectively, to at least 40* both, to have functional motion for tasks like reach and grasp.  Goal status:  MET 07/24/22: now 35* / 42* respectively  12/12 - 54/44*  4.  Pt will improve strength in Rt wrist flex, ext to at least 4+/5 MMT to have increased functional ability to carry out selfcare and higher-level homecare tasks with no difficulty. Goal status: 07/24/22: now 4+/5 flexion, but 4-/5 ext  5.  Pt will improve coordination skills in Rt hand, as seen by better score on 9HPT testing to at least 55sec to have increased functional ability to carry out fine motor tasks (fasteners, etc.) and more complex, coordinated IADLs (meal prep, sports, etc.).  Goal status: 07/24/22: today 67 sec     ASSESSMENT:  CLINICAL IMPRESSION: Pt continues to benefit from skilled OT services to improve edema, ROM, strength, and coordination in RUE as needed to complete ADLs and IADLs more independently and safely. He will require extension of services past original POC due to limitations with cognition and carryover.   PLAN: OT FREQUENCY: 2x/week   OT DURATION: 24 visits through 10/12/2022   PLANNED INTERVENTIONS: self care/ADL training, therapeutic exercise, therapeutic activity, neuromuscular re-education, manual therapy, passive range of motion, functional mobility training, splinting, electrical stimulation, ultrasound, fluidotherapy, compression bandaging, moist heat, cryotherapy, contrast bath, patient/family education, and coping strategies training  RECOMMENDED OTHER SERVICES: is getting PT for shoulder tears right now. PT was recommended to assess and tx for LE weakness/stroke signs as well. He was also recommended to see cardiologist/neurologist about possible stroke evolution.   CONSULTED AND AGREED WITH PLAN OF CARE: Patient and family member/caregiver  PLAN FOR NEXT SESSION:  Work toward LTG - review red putty HEP; red theraband elbow  ext, Continue to reinforce proper positioning with exercises, encourage forced use of RUE for BADLS (when safe to do so)  Redmond Baseman, OTR/L 09/04/22 1:24 PM Phone [phone] FAX [phone]

## 2022-09-06 ENCOUNTER — Ambulatory Visit: Payer: Medicare HMO | Admitting: Physical Therapy

## 2022-09-06 ENCOUNTER — Ambulatory Visit: Payer: Medicare HMO | Admitting: Occupational Therapy

## 2022-09-06 ENCOUNTER — Encounter: Payer: Self-pay | Admitting: Occupational Therapy

## 2022-09-06 VITALS — BP 157/71 | HR 71

## 2022-09-06 DIAGNOSIS — R278 Other lack of coordination: Secondary | ICD-10-CM

## 2022-09-06 DIAGNOSIS — M6281 Muscle weakness (generalized): Secondary | ICD-10-CM

## 2022-09-06 DIAGNOSIS — I6381 Other cerebral infarction due to occlusion or stenosis of small artery: Secondary | ICD-10-CM

## 2022-09-06 DIAGNOSIS — I69351 Hemiplegia and hemiparesis following cerebral infarction affecting right dominant side: Secondary | ICD-10-CM

## 2022-09-06 DIAGNOSIS — G8929 Other chronic pain: Secondary | ICD-10-CM

## 2022-09-06 DIAGNOSIS — I63512 Cerebral infarction due to unspecified occlusion or stenosis of left middle cerebral artery: Secondary | ICD-10-CM

## 2022-09-06 DIAGNOSIS — M25631 Stiffness of right wrist, not elsewhere classified: Secondary | ICD-10-CM

## 2022-09-06 DIAGNOSIS — R600 Localized edema: Secondary | ICD-10-CM

## 2022-09-06 DIAGNOSIS — R2681 Unsteadiness on feet: Secondary | ICD-10-CM

## 2022-09-06 NOTE — Therapy (Signed)
OUTPATIENT OCCUPATIONAL THERAPY TREATMENT NOTE  Patient Name: Joseph Patrick MRN: [id number] DOB:12/01/1945, 76 y.o., male Today's Date: 09/06/2022  PCP: Arthur Holms, NP REFERRING PROVIDER: Leandrew Koyanagi, MD     OT End of Session - 09/06/22 1019     Visit Number 16    Number of Visits 24    Date for OT Re-Evaluation 10/12/21    Authorization Type [employer]    Progress Note Due on Visit 18    OT Start Time 1020    OT Stop Time 1100    OT Time Calculation (min) 40 min    Activity Tolerance Patient tolerated treatment well;Patient limited by fatigue;Patient limited by pain    Behavior During Therapy [facility] for tasks assessed/performed              Past Medical History:  Diagnosis Date   CVA (cerebral vascular accident) [facility])    april 2023   Diabetes mellitus without complication ([place])    Hypertension    History reviewed. No pertinent surgical history. 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 ([place]) 01/14/2022   HFrEF (heart failure with reduced ejection fraction) ([place])    Essential hypertension 01/07/2022   Hyperlipidemia 01/07/2022   Diabetes ([place]) 01/07/2022   Stage 3a chronic kidney disease (CKD) ([place]) 01/07/2022   Transient cerebral ischemia 01/06/2022    ONSET DATE:  July 16th, 2023 (about 12 weeks since injury)   REFERRING DIAG: M25.531 (ICD-10-CM) - Pain in right wrist  THERAPY DIAG:  Muscle weakness (generalized)  Other lack of coordination  Spastic hemiplegia of right dominant side as late effect of cerebral infarction (HCC)  Stiffness of right wrist, not elsewhere classified  Chronic right shoulder pain  Acute ischemic left MCA stroke (HCC)  Localized edema  Lacunar stroke ([place])  Rationale for Evaluation and Treatment Rehabilitation  PERTINENT HISTORY: Per medical notes, he fell ~ 2 months ago,  tearing (complete supraspinatus) Rt RTC, tore Rt TFCC as well as S-L ligament.  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 He states falling ~2 months ago and wrist bend into flexion and he tore ligaments in his wrist. He states his right hand is now swollen and stiff. He also states having a stroke this April (about 6 months ago) that affected Rt side, but he states rehabbing back to "normal" before fall. He states no significant pain at fall and none now- just swollen, tight, stiff and also poorer coordination now. He states not wearing any wrist brace after fall, tried a sling for his shoulder, which he felt "made him worse," so d/c'd it.   His niece is with him today to support him."  PRECAUTIONS: Fall and Other: Rt RTC tears   WEIGHT BEARING RESTRICTIONS Yes caution for Rt Shoulder and wrist   SUBJECTIVE:   SUBJECTIVE STATEMENT: Pt states his fingers feel really weak after completing multiple fine motor coordination tasks.  Pt accompanied by: niece  PAIN:  Are you having pain?  No  FALLS: Has patient fallen in last 6 months? Yes. Number of falls 1 - this injury  LIVING ENVIRONMENT: Lives with: Alone. He states driving since stroke. He uses a cane for stability.    OBJECTIVE: (All objective assessments below are from initial evaluation on: 06/28/22 unless otherwise specified.)    HAND DOMINANCE: Right   ADLs:  Overall ADLs: He states problems with dressing, bathing, turning door handles, and FMS buttoning with Rt hand now.   FUNCTIONAL OUTCOME MEASURES: 07/24/22: PRWE: Pain: 3/50; Function: 18.5/40; Total Score: 21.5/90 (Higher Score  =  More Pain and/or Debility)   Eval: Patient Rated Wrist Evaluation (PRWE) (modified to exclude work and recreational activities as he states not doing at baseline): Pain: 0/50; Function: 19.5/40; Total Score: 19.5/90 (Higher Score  =  More Pain and/or Debility)    UPPER EXTREMITY ROM     Shoulder  to Wrist AROM Right eval Left eval Rt 07/10/22 Rt 07/24/22 Rt  12/12  Forearm supination 63   56 84  Forearm pronation  80   83 84  Wrist flexion 37* (tender) (40* PROM)  50* 47 35 54  Wrist extension 28* (40* PROM)  68* 36 42 44  Wrist ulnar deviation 7*  19 25   Wrist radial deviation 5*  14 15   Functional dart thrower's motion (F-DTM) in ulnar flexion 25*      F-DTM in radial extension  27*      (Blank rows = not tested)   Hand AROM Right eval Right 07/24/22  Full Fist Ability (or Gap to Distal Palmar Crease) Loose fist (fingers barely touch palm Still can touch palm with all fingers with effort  Thumb Opposition to Small Finger (or Gap) 1.5cm gap and slow motion ~1.5 - 2cm gap to oppose  Thumb Opposition to Base of Small Finger (or Gap)  unable unable  (Blank rows = not tested)   UPPER EXTREMITY MMT:    Eval:  NT at eval due to concern for ligament tears, however he was stable and non-painful and this will be tested next session to help "tease out" CVA type symptoms as well. Definite weakness Rt vs Lt.   MMT Right 07/03/22 Rt 07/24/22  Elbow flexion 5/5 good tone, poor endurance 4+/5 MMT  Elbow extension 4-/5 lower tone 4+/5 MMT  Forearm supination 4+/5 good tone 4+/5 MMT  Forearm pronation 4+/5 good tone 5/5 MMY  Wrist flexion 4+/5 4+/5 MMT  Wrist/finger extension 3+/5 4-/5 MMT  (Blank rows = not tested)  HAND FUNCTION: 07/24/22: Rt grip today: 25.7#  07/03/22: Rt 19# grip (no pain); Lt: 118#    COORDINATION: 07/24/22: Box & Blocks Test Right 33 Blocks Today (46 is WFL)  9HPT: 9HPT: Rt 60mn 7sec   07/03/22: Box & Blocks Test Right 21 Blocks Today (46 is WFL)   Eval: He has observed ataxic motion with finger to nose test, observed slow and poor FMS in Rt hand compared to Lt.  9HPT: Rt 138m 46sec with several pegs dropped.   SENSATION: Eval:  Light touch intact today, he states equal b/l   EDEMA:   07/03/22: Rt hand figure of 8:   48.7cm today  (Lt hand is  48.8cm);  Rt IF base 7.5cm (Lt hand is 7.2cm)   COGNITION: 07/24/22: more overt memory issues since start of care, he can't recall doing box and blocks before, he forgets mentioning lateral shoulder pain, seems to discuss the same things over during each session, etc.   Eval: [facility], though states some memory issues  OBSERVATIONS:   Eval: no facial droop, but Rt sided ataxia present in UE and LE, poor tone and coordination noted as well.  Concern for exacerbation of CVA as main issue vs ligament tears and orthopedic issues.  (Rt wrist seems stable, negative Watson's Test, no DRUJ  instability, negative TFCC shear test)   TODAY'S TREATMENT:   - Therapeutic activities completed for duration as noted below including: Practiced writing name in print with built up pen w/ forearm and elbow supported on table for greater positioning, comfort, and preventing sh hiking. Pt also utilizing Pen Again for adaptive option and to reduce stability of R hand.   Medium pegs using R to complete puzzle at table level with placement of rolled up towel between trunk and R elbow to reduce compensatory movements for improved fine motor coordination. Pt requiring cues for correct peg placement.   Exercises: - Seated Shoulder External Rotation PROM on Table  - 3-4 x daily - 3-5 reps - 15-20 sec hold - Sleeper Stretch  - 3-4 x daily - 5 reps - 15-20 sec hold - Seated Single Arm Shoulder PNF D1 Flexion  - 4-6 x daily - 10-15 reps - Wrist Flexion Stretch  - 4 x daily - 3-5 reps - 15 sec hold - Wrist Extension Stretch Pronated  - 4 x daily - 3-5 reps - 15 hold - Bend and Pull Back Wrist SLOWLY  - 4 x daily - 10-15 reps - Tendon Glides  - 4-6 x daily - 3-5 reps - 2-3 seconds hold - Thumb Opposition  - 4-6 x daily - 10 reps - Seated Single Finger Extension  - 4-6 x daily - 10-15 reps - Full Fist  - 2-3 x daily - 5 reps - Finger Extension "Pizza!"   - 2-3 x daily - 5 reps -  Finger Pinch and Pull with Putty  - 2-3 x daily - 5 reps - Wrist Extension with Resistance  - 2-4 x daily - 1-2 sets - 10-15 reps - Wrist Flexion with Resistance  - 2-4 x daily - 1-2 sets - 10-15 reps   PATIENT EDUCATION: Education details: See tx section above for details  Person educated: Patient; niece Education method: Verbal Instruction, Teach back, Handouts  Education comprehension: States and demonstrates understanding, Additional Education required   HOME EXERCISE PROGRAM: Access Code: [email or web address] URL: [email or web address]   GOALS: Goals reviewed with patient? Yes   SHORT TERM GOALS: (STG required if POC>30 days) Target Date: 07/13/22  Pt will obtain protective, custom orthotic. Goal status: D/C he doesn't need at this point   2.  Pt will demo/state understanding of initial HEP to improve pain levels and prerequisite motion. Goal status: 07/24/22: MET but he cannot recall most today    LONG TERM GOALS: Target Date: 09/07/22  Pt will improve functional ability by decreased impairment per PRWE functional assessment from 19.5/40 to 10/40 or better, for better quality of life. Goal status: 07/24/22: 18.5 today, slightly better, admits to favoring Lt hand and not using right hand at times.   2.  Pt will improve grip strength in Rt hand to at least 40lbs for functional use at home and in IADLs. Goal status:  07/24/22: 25# today 12/15 - 25.5 today  3.  Pt will improve A/ROM in Rt wrist flex & ext from 37/28* respectively, to at least 40* both, to have functional motion for tasks like reach and grasp.  Goal status:  MET 07/24/22: now 35* / 42* respectively  12/12 - 54/44*  4.  Pt will improve strength in Rt wrist flex, ext to at least 4+/5 MMT to have increased functional ability to carry out selfcare and higher-level homecare tasks with no difficulty. Goal status: 07/24/22: now 4+/5 flexion, but 4-/5 ext  5.  Pt will improve  coordination skills in Rt hand, as  seen by better score on 9HPT testing to at least 55sec to have increased functional ability to carry out fine motor tasks (fasteners, etc.) and more complex, coordinated IADLs (meal prep, sports, etc.).  Goal status: 07/24/22: today 67 sec     ASSESSMENT:  CLINICAL IMPRESSION: Pt continues to benefit from skilled OT services to improve edema, ROM, strength, and coordination in RUE as needed to complete ADLs and IADLs more independently and safely.   PLAN: OT FREQUENCY: 2x/week   OT DURATION: 24 visits through 10/12/2022   PLANNED INTERVENTIONS: self care/ADL training, therapeutic exercise, therapeutic activity, neuromuscular re-education, manual therapy, passive range of motion, functional mobility training, splinting, electrical stimulation, ultrasound, fluidotherapy, compression bandaging, moist heat, cryotherapy, contrast bath, patient/family education, and coping strategies training  RECOMMENDED OTHER SERVICES: is getting PT for shoulder tears right now. PT was recommended to assess and tx for LE weakness/stroke signs as well. He was also recommended to see cardiologist/neurologist about possible stroke evolution.   CONSULTED AND AGREED WITH PLAN OF CARE: Patient and family member/caregiver  PLAN FOR NEXT SESSION:  Work toward LTG - review red putty HEP; red theraband elbow ext, Continue to reinforce proper positioning with exercises, encourage forced use of RUE for BADLS (when safe to do so)   Dennis Bast, OT 09/06/2022, 10:52 AM

## 2022-09-06 NOTE — Therapy (Signed)
OUTPATIENT PHYSICAL THERAPY NEURO TREATMENT   Patient Name: Joseph Patrick MRN: [id number] DOB:1945/10/12, 76 y.o., male Today's Date: 09/06/2022   PCP: Arthur Holms NP REFERRING PROVIDER: Garvin Fila, MD   PT End of Session - 09/06/22 0932     Visit Number 13    Number of Visits 21   recert   Date for PT Re-Evaluation 09/27/22   to allow for scheduling conflicts   Authorization Type [employer]    Authorization Time Period 08/16/22-09/27/22    Progress Note Due on Visit 10    PT Start Time 0930    PT Stop Time 1015    PT Time Calculation (min) 45 min    Equipment Utilized During Treatment Gait belt    Activity Tolerance Patient tolerated treatment well    Behavior During Therapy [facility] for tasks assessed/performed                      Past Medical History:  Diagnosis Date   CVA (cerebral vascular accident) [facility])    april 2023   Diabetes mellitus without complication ([place])    Hypertension    No past surgical history on file. 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 ([place]) 01/14/2022   HFrEF (heart failure with reduced ejection fraction) ([place])    Essential hypertension 01/07/2022   Hyperlipidemia 01/07/2022   Diabetes ([place]) 01/07/2022   Stage 3a chronic kidney disease (CKD) ([place]) 01/07/2022   Transient cerebral ischemia 01/06/2022    ONSET DATE: 07/12/2022  REFERRING DIAG: V78.469 (ICD-10-CM) - Spastic hemiplegia of right dominant side as late effect of cerebral infarction (HCC) I63.81 (ICD-10-CM) - Lacunar stroke (HCC)  THERAPY DIAG:  Muscle weakness (generalized)  Other lack of coordination  Unsteadiness on feet  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 No other acute changes since last session, no pain today. Pt reports he has been doing up to 40 stairs per day to work on his LE strength, otherwise just doing his OT HEP but not his PT HEP.  Pt accompanied by: family member (niece [name])  PERTINENT HISTORY: hypertension, hyperlipidemia, diabetes mellitus, CKD stage  III, CHF with ejection fraction of 25 to 30%, medical noncompliance, recent CVA left hemispheric subcortical  PAIN:  Are you having pain? No  VITALS (LUE in Sitting): Vitals:   09/06/22 0935  BP: (!) 157/71  Pulse: 71     PRECAUTIONS: Fall  WEIGHT BEARING RESTRICTIONS: No  FALLS: Has patient fallen in last 6 months? Yes. Number of falls 1 in August that led to R shoulder injury (tear in rotator cuff muscles)  LIVING ENVIRONMENT: Lives with: lives alone Lives in: House/apartment Stairs: No (can use elevator) Has following equipment at home: Single point cane  PLOF: Independent with gait, Independent with transfers, and Requires assistive device for independence  PATIENT GOALS: "strengthen my right leg and my right arm" "I want to be able to swing a golf club with RUE"  OBJECTIVE:  THER EX: When pt ambulates into clinic this morning his RLE appears more "stiff" with decreased hip and knee flexion noted and some circumduction of limb. Initiated session with SciFit this AM to loosen joints. SciFit multi-peaks level 4 for 8 minutes using BUE/BLEs for neural priming for reciprocal movement, dynamic cardiovascular warmup and increased amplitude of stepping. RPE of 10/10 following activity.   Seated resisted marches with RTB 3 x 10 reps Seated resisted R hip add with RTB 3 x 10 reps  Added to HEP, see bolded below   THER ACT: Ambulation over 4" hurdles with  SPC and CGA for balance 8 x 10 ft with focus on R hip flexion and clearing hurdle  GAIT: Gait pattern: decreased hip/knee flexion- Right Distance walked: 230 ft Assistive device utilized: Single point cane Level of assistance: Modified independence Comments: decreased R hip and knee flexion but improved from start of session   STAIRS:  Level of Assistance: Modified independence  Stair Negotiation Technique: Alternating Pattern  with Single Rail on Right  Number of Stairs: 12   Height of Stairs: 6  Comments: focus on technique to to work on RLE strengthening   PATIENT EDUCATION: Education details: continue HEP, added to [employer] Person educated: Patient and niece [name] Education method: Explanation, Demonstration, and Handouts Education comprehension: verbalized understanding  HOME EXERCISE PROGRAM: Access Code: [email or web address] URL: [email or web address] Date: 07/25/2022 Prepared by: Elease Etienne  Exercises - Staggered Sit-to-Stand  - 1 x daily - 4-5 x weekly - 2-3 sets - 10 reps - Standing Tandem Balance with Counter Support  - 1 x daily - 5 x weekly - 1 sets - 2-3 reps - 30-45 seconds hold - Tandem Walking with Counter Support  - 1 x daily - 5-6 x weekly - 3 sets - 10 reps - Mini Squat with Counter Support  - 1 x daily - 7 x weekly - 3 sets - 10 reps - Standing 3-Way Leg Reach with Resistance at Ankles and Counter Support  - 1 x daily - 7 x weekly - 3 sets - 10 reps - Supine Bridge with Mini Swiss Ball Between Knees  - 1 x daily - 7 x weekly - 3 sets - 10 reps - Forward Step Up with Counter Support  - 1 x daily - 7 x weekly - 3 sets - 10 reps - Supine Heel Slide  - 1 x daily - 7 x weekly - 3 sets - 10 reps - Hooklying Single Leg Bent Knee Fallouts with Resistance  - 1 x daily - 7 x weekly - 3 sets - 10 reps - Seated March with Resistance  - 1 x daily - 7 x  weekly - 3 sets - 10 reps - Seated Resisted Hip Adduction - 1 x daily - 7 x weekly - 3 sets - 10  reps   GOALS: Goals reviewed with patient? Yes  SHORT TERM GOALS=LONG-TERM GOALS due to length of POC  LONG TERM GOALS: Target date: 08/17/2022  Pt will be independent with final HEP for improved strength, balance, transfers and gait. Baseline:  Goal status: IN PROGRESS  2.  Pt will demonstrate TUG of </=13 seconds in order to decrease risk of falls and improve functional mobility using LRAD. Baseline: 14.35 sec w/ SPC and R AFO, 14.18 sec with SPC and R AFO (11/30) Goal status: IN PROGRESS  3.  Pt will improve FGA score to >/=20/30 in order to demonstrate improved balance and decreased fall risk. Baseline: 14/30 no AD, 17/30 with SPC (11/30) Goal status: IN PROGRESS  4.  Pt will demonstrate a gait speed of >/=3.0 feet/sec using LRAD and AFO in order to decrease risk for falls. Baseline: 2.65 ft/sec w/ SPC and R AFO, 1.91 ft/sec with SPC and AFO (11/30) Goal status: NOT MET   NEW LONG TERM GOALS: Target date: 09/20/2022  Pt will be independent with final HEP for improved strength, balance, transfers and gait. Baseline:  Goal status: IN PROGRESS  2.  Pt will demonstrate TUG of </=13 seconds in order to decrease risk of falls and improve functional mobility using LRAD. Baseline: 14.35 sec w/ SPC and R AFO, 14.18 sec with SPC and R AFO (11/30) Goal status: IN PROGRESS  3.  Pt will improve FGA score to >/=20/30 in order to demonstrate improved balance and decreased fall risk. Baseline: 14/30 no AD, 17/30 with SPC (11/30) Goal status: IN PROGRESS  4.  Pt will demonstrate a gait speed of >/=2.75 feet/sec using LRAD and AFO in order to decrease risk for falls. Baseline: 2.65 ft/sec w/ SPC and R AFO, 1.91 ft/sec with SPC and AFO (11/30) Goal status: REVISED   ASSESSMENT:  CLINICAL IMPRESSION: Emphasis of skilled PT session on continuing to work on RLE strengthening with focus on hip flexors and hip add this session. Pt continues to exhibit decreased R hip and knee flexion during  gait as well as some ER of hip. Pt exhibits some compensatory mechanics due to R hip flexor weakness including circumduction. Pt continues to benefit from skilled therapy services to address ongoing RLE weakness leading to decreased safety and increased fall risk. Continue POC.   OBJECTIVE IMPAIRMENTS: Abnormal gait, decreased balance, decreased coordination, decreased endurance, decreased mobility, and decreased strength.   ACTIVITY LIMITATIONS: carrying, lifting, bending, squatting, stairs, and transfers  PARTICIPATION LIMITATIONS: community activity  PERSONAL FACTORS: Age and 1-2 comorbidities:    hypertension, hyperlipidemia, diabetes mellitus, CKD stage III, CHF with ejection fraction of 25 to 30%, medical noncompliance, recent CVA left hemispheric subcortical region are also affecting patient's functional outcome.   REHAB POTENTIAL: Good  CLINICAL DECISION MAKING: Stable/uncomplicated  EVALUATION COMPLEXITY: Low  PLAN:  PT FREQUENCY: 2x/week  PT DURATION: 6 weeks + 12 visits (recert)  PLANNED INTERVENTIONS: Therapeutic exercises, Therapeutic activity, Neuromuscular re-education, Balance training, Gait training, Patient/Family education, Self Care, Joint mobilization, Stair training, Vestibular training, Canalith repositioning, Visual/preceptual remediation/compensation, Orthotic/Fit training, DME instructions, Aquatic Therapy, Dry Needling, Electrical stimulation, Cryotherapy, Moist heat, Taping, Manual therapy, and Re-evaluation  PLAN FOR NEXT SESSION: check L BP!, RLE NMR, Hip flexor and HS strengthening, lateral weight shifting, increased step length w/gait, intro basic dual-tasks, reaction time, SLS on RLE, SL bridge, cone taps  from rocker board   Excell Seltzer, PT, DPT, CSRS 09/06/2022, 10:17 AM

## 2022-09-11 ENCOUNTER — Ambulatory Visit: Payer: Medicare HMO | Admitting: Physical Therapy

## 2022-09-11 ENCOUNTER — Encounter: Payer: Self-pay | Admitting: Physical Therapy

## 2022-09-11 VITALS — BP 132/59 | HR 78

## 2022-09-11 DIAGNOSIS — I69351 Hemiplegia and hemiparesis following cerebral infarction affecting right dominant side: Secondary | ICD-10-CM

## 2022-09-11 DIAGNOSIS — M6281 Muscle weakness (generalized): Secondary | ICD-10-CM | POA: Diagnosis not present

## 2022-09-11 DIAGNOSIS — R2681 Unsteadiness on feet: Secondary | ICD-10-CM

## 2022-09-11 DIAGNOSIS — R600 Localized edema: Secondary | ICD-10-CM

## 2022-09-11 DIAGNOSIS — R278 Other lack of coordination: Secondary | ICD-10-CM

## 2022-09-11 NOTE — Therapy (Signed)
OUTPATIENT PHYSICAL THERAPY NEURO TREATMENT   Patient Name: Joseph Patrick MRN: [id number] DOB:[birth year], 76 y.o., male Today's Date: 09/11/2022   PCP: Joseph Back NP REFERRING PROVIDER: Micki Riley, MD   PT End of Session - 09/11/22 1018     Visit Number 14    Number of Visits 21   recert   Date for PT Re-Evaluation 09/27/22   to allow for scheduling conflicts   Authorization Type [employer]    Authorization Time Period 08/16/22-09/27/22    Progress Note Due on Visit 10    PT Start Time 1015    PT Stop Time 1058    PT Time Calculation (min) 43 min    Equipment Utilized During Treatment Gait belt    Activity Tolerance Patient tolerated treatment well    Behavior During Therapy [facility] for tasks assessed/performed                      Past Medical History:  Diagnosis Date   CVA (cerebral vascular accident) [facility])    april 2023   Diabetes mellitus without complication (HCC)    Hypertension    History reviewed. No pertinent surgical history. 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) 01/14/2022   HFrEF (heart failure with reduced ejection fraction) (HCC)    Essential hypertension 01/07/2022   Hyperlipidemia 01/07/2022   Diabetes (HCC) 01/07/2022   Stage 3a chronic kidney disease (CKD) (HCC) 01/07/2022   Transient cerebral ischemia 01/06/2022    ONSET DATE: 07/12/2022  REFERRING DIAG: N82.956 (ICD-10-CM) - Spastic hemiplegia of right dominant side as late effect of cerebral infarction (HCC) I63.81 (ICD-10-CM) - Lacunar stroke (HCC)  THERAPY DIAG:  Muscle weakness (generalized)  Other lack of coordination  Spastic hemiplegia of right dominant side as late effect of cerebral infarction (HCC)  Unsteadiness on feet  Localized edema  Rationale for Evaluation and Treatment: Rehabilitation  SUBJECTIVE:                                                                                                                                                                                              SUBJECTIVE STATEMENT: Pt reports he feels like he moves slowly in the morning.  He states he has been doing the exercises added to HEP last session and working the arm some.  He continues to do steps 2x a day up to 40 steps at a time prior to taking a short walk around the building.  He denies falls or other  acute changes.  Pt accompanied by: family member (niece Joseph Patrick)  PERTINENT HISTORY: hypertension, hyperlipidemia, diabetes mellitus, CKD stage III, CHF with ejection fraction of 25 to 30%, medical noncompliance, recent CVA left hemispheric subcortical  PAIN:  Are you having pain? No  VITALS (LUE in Sitting): Vitals:   09/11/22 1024  BP: (!) 132/59  Pulse: 78   PRECAUTIONS: Fall  WEIGHT BEARING RESTRICTIONS: No  FALLS: Has patient fallen in last 6 months? Yes. Number of falls 1 in August that led to R shoulder injury (tear in rotator cuff muscles)  LIVING ENVIRONMENT: Lives with: lives alone Lives in: House/apartment Stairs: No (can use elevator) Has following equipment at home: Single point cane  PLOF: Independent with gait, Independent with transfers, and Requires assistive device for independence  PATIENT GOALS: "strengthen my right leg and my right arm" "I want to be able to swing a golf club with RUE"  OBJECTIVE:  THER EX: -SciFit level 3 for 8 minutes using BLEs only for neural priming for reciprocal movement due to pt having ambulatory stiffness and reporting slowness of movement in morning as well as dynamic cardiovascular warmup and increased amplitude of stepping. Pt cued to pace himself using step/min monitor in line with RPE of 5-6/10 vs 10/10 last session.  Pt continues to push himself to fatigue.  Edu on pacing activity to ensure safety and energy reserved for necessary ADLs. -Supine hamstring  curls on peanut ball w/ therapist facilitating medial hamstring engagement by holding foot in neutral 2x15 -Supine bridges x10 > LLE single leg bridges x10 > RLE single leg bridges w/ LLE elevated on red theraball 2x10, cued for right hip engagement keeping knee in neutral position  NMR: -Standing w/ LLE elevated on soccer ball using SPC for light assistance > gentle rolls w/ stopping ball w/ LLE > stop and return w/ LLE  PATIENT EDUCATION: Education details: Continue HEP, discussed energy conservation when needed, walking building and maintaining good mechanics, continuing to address stair descent w/ RLE in rear vs reciprocal. Person educated: Patient and niece Joseph Patrick Education method: Explanation, Demonstration, and Handouts Education comprehension: verbalized understanding  HOME EXERCISE PROGRAM: Access Code: [email or web address] URL: [email or web address] Date: 07/25/2022 Prepared by: Joseph Patrick  Exercises - Staggered Sit-to-Stand  - 1 x daily - 4-5 x weekly - 2-3 sets - 10 reps - Standing Tandem Balance with Counter Support  - 1 x daily - 5 x weekly - 1 sets - 2-3 reps - 30-45 seconds hold - Tandem Walking with Counter Support  - 1 x daily - 5-6 x weekly - 3 sets - 10 reps - Mini Squat with Counter Support  - 1 x daily - 7 x weekly - 3 sets - 10 reps - Standing 3-Way Leg Reach with Resistance at Ankles and Counter Support  - 1 x daily - 7 x weekly - 3 sets - 10 reps - Supine Bridge with Mini Swiss Ball Between Knees  - 1 x daily - 7 x weekly - 3 sets - 10 reps - Forward Step Up with Counter Support  - 1 x daily - 7 x weekly - 3 sets - 10 reps - Supine Heel Slide  - 1 x daily - 7 x weekly - 3 sets - 10 reps - Hooklying Single Leg Bent Knee Fallouts with Resistance  - 1 x daily - 7 x weekly - 3 sets - 10 reps - Seated March with Resistance  - 1 x daily - 7 x weekly - 3 sets -  10 reps - Seated Resisted Hip Adduction - 1 x daily - 7 x weekly - 3 sets - 10  reps   GOALS: Goals reviewed with patient? Yes  SHORT TERM GOALS=LONG-TERM GOALS due to length of POC  NEW LONG TERM GOALS: Target date: 09/20/2022  Pt will be independent with final HEP for improved strength, balance, transfers and gait. Baseline:  Goal status: IN PROGRESS  2.  Pt will demonstrate TUG of </=13 seconds in order to decrease risk of falls and improve functional mobility using LRAD. Baseline: 14.35 sec w/ SPC and R AFO, 14.18 sec with SPC and R AFO (11/30) Goal status: IN PROGRESS  3.  Pt will improve FGA score to >/=20/30 in order to demonstrate improved balance and decreased fall risk. Baseline: 14/30 no AD, 17/30 with SPC (11/30) Goal status: IN PROGRESS  4.  Pt will demonstrate a gait speed of >/=2.75 feet/sec using LRAD and AFO in order to decrease risk for falls. Baseline: 2.65 ft/sec w/ SPC and R AFO, 1.91 ft/sec with SPC and AFO (11/30) Goal status: REVISED   ASSESSMENT:  CLINICAL IMPRESSION: Continued to address RLE strength and stability during SLS using static progressed to dynamic task with soccer ball.  Further addressed hamstring and glut strength in supine position w/ pt demonstrating ongoing medial hamstring and glut weakness RLE worse than LLE.  He would benefit from further PT to address the deficits noted in this session and prior that impact his safe independence.   OBJECTIVE IMPAIRMENTS: Abnormal gait, decreased balance, decreased coordination, decreased endurance, decreased mobility, and decreased strength.   ACTIVITY LIMITATIONS: carrying, lifting, bending, squatting, stairs, and transfers  PARTICIPATION LIMITATIONS: community activity  PERSONAL FACTORS: Age and 1-2 comorbidities:    hypertension, hyperlipidemia, diabetes mellitus, CKD stage III, CHF with ejection fraction of 25 to 30%, medical noncompliance, recent CVA left hemispheric subcortical region are also affecting patient's functional outcome.   REHAB POTENTIAL: Good  CLINICAL  DECISION MAKING: Stable/uncomplicated  EVALUATION COMPLEXITY: Low  PLAN:  PT FREQUENCY: 2x/week  PT DURATION: 6 weeks + 12 visits (recert)  PLANNED INTERVENTIONS: Therapeutic exercises, Therapeutic activity, Neuromuscular re-education, Balance training, Gait training, Patient/Family education, Self Care, Joint mobilization, Stair training, Vestibular training, Canalith repositioning, Visual/preceptual remediation/compensation, Orthotic/Fit training, DME instructions, Aquatic Therapy, Dry Needling, Electrical stimulation, Cryotherapy, Moist heat, Taping, Manual therapy, and Re-evaluation  PLAN FOR NEXT SESSION: check L BP!, RLE NMR, Hip flexor and HS strengthening, increased step length w/gait, intro basic dual-tasks, reaction time, SLS on RLE, SL bridge on compliant surface vs having LLE elevated, cone taps from rocker board, continue working on stair descent w/ RLE in rear, could try dual task blaze pods on mirror w/ step and reach > compliant surface   Sadie Haber, PT, DPT 09/11/2022, 1:51 PM

## 2022-09-13 ENCOUNTER — Ambulatory Visit: Payer: Medicare HMO | Admitting: Physical Therapy

## 2022-09-13 VITALS — BP 153/71 | HR 76

## 2022-09-13 DIAGNOSIS — R2681 Unsteadiness on feet: Secondary | ICD-10-CM

## 2022-09-13 DIAGNOSIS — M6281 Muscle weakness (generalized): Secondary | ICD-10-CM

## 2022-09-13 DIAGNOSIS — I63512 Cerebral infarction due to unspecified occlusion or stenosis of left middle cerebral artery: Secondary | ICD-10-CM

## 2022-09-13 DIAGNOSIS — R278 Other lack of coordination: Secondary | ICD-10-CM

## 2022-09-13 NOTE — Therapy (Signed)
OUTPATIENT PHYSICAL THERAPY NEURO TREATMENT   Patient Name: Joseph Patrick MRN: [id number] DOB:[birth year], 76 y.o., male Today's Date: 09/13/2022   PCP: Loura Back NP REFERRING PROVIDER: Micki Riley, MD   PT End of Session - 09/13/22 1012     Visit Number 15    Number of Visits 21   recert   Date for PT Re-Evaluation 09/27/22   to allow for scheduling conflicts   Authorization Type [employer]    Authorization Time Period 08/16/22-09/27/22    Progress Note Due on Visit 10    PT Start Time 1011    PT Stop Time 1104    PT Time Calculation (min) 53 min    Equipment Utilized During Treatment Gait belt    Activity Tolerance Patient tolerated treatment well    Behavior During Therapy [facility] for tasks assessed/performed                       Past Medical History:  Diagnosis Date   CVA (cerebral vascular accident) [facility])    april 2023   Diabetes mellitus without complication (HCC)    Hypertension    No past surgical history on file. 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) 01/14/2022   HFrEF (heart failure with reduced ejection fraction) (HCC)    Essential hypertension 01/07/2022   Hyperlipidemia 01/07/2022   Diabetes (HCC) 01/07/2022   Stage 3a chronic kidney disease (CKD) (HCC) 01/07/2022   Transient cerebral ischemia 01/06/2022    ONSET DATE: 07/12/2022  REFERRING DIAG: M42.683 (ICD-10-CM) - Spastic hemiplegia of right dominant side as late effect of cerebral infarction (HCC) I63.81 (ICD-10-CM) - Lacunar stroke (HCC)  THERAPY DIAG:  Muscle weakness (generalized)  Other lack of coordination  Unsteadiness on feet  Acute ischemic left MCA stroke (HCC)  Rationale for Evaluation and Treatment: Rehabilitation  SUBJECTIVE:                                                                                                                                                                                              SUBJECTIVE STATEMENT: Pt reports he has been walking the steps a lot, has not done a lot of his HEP as he doesn't always have energy for it. Pt reports he feels like his appetite has decreased since the stroke and he feels like he doesn't have energy in the morning but it does get better throughout the day.  Pt accompanied by: family member (niece [name])  PERTINENT HISTORY: hypertension, hyperlipidemia,  diabetes mellitus, CKD stage III, CHF with ejection fraction of 25 to 30%, medical noncompliance, recent CVA left hemispheric subcortical  PAIN:  Are you having pain? No  VITALS (LUE in Sitting): Vitals:   09/13/22 1015  BP: (!) 153/71  Pulse: 76    PRECAUTIONS: Fall  WEIGHT BEARING RESTRICTIONS: No  FALLS: Has patient fallen in last 6 months? Yes. Number of falls 1 in August that led to R shoulder injury (tear in rotator cuff muscles)  LIVING ENVIRONMENT: Lives with: lives alone Lives in: House/apartment Stairs: No (can use elevator) Has following equipment at home: Single point cane  PLOF: Independent with gait, Independent with transfers, and Requires assistive device for independence  PATIENT GOALS: "strengthen my right leg and my right arm" "I want to be able to swing a golf club with RUE"  OBJECTIVE:  THER EX: SciFit multi-peaks level 3.5 for 8 minutes using BUE/BLEs for neural priming for reciprocal movement, dynamic cardiovascular warmup and increased amplitude of stepping. RPE of 7/10 following activity.   Reviewed HEP due to pt reports of decreased compliance. Pt requires min cueing for slow, controlled movements during exercise performance as well as correct body mechanics to prevent compensations.  GAIT: Gait pattern: decreased hip/knee flexion- Right, decreased ankle dorsiflexion- Right, circumduction- Right, and lateral lean- Left Distance walked: various clinic  distances Assistive device utilized: Single point cane Level of assistance: Modified independence Comments: ongoing RLE "stiffness" and decreased hip/knee flexion during gait   PATIENT EDUCATION: Education details: continue HEP Person educated: Patient and niece [name] Education method: Explanation, Demonstration, and Handouts Education comprehension: verbalized understanding  HOME EXERCISE PROGRAM: Access Code: [email or web address] URL: [email or web address] Date: 07/25/2022 Prepared by: Camille Bal  Exercises - Staggered Sit-to-Stand  - 1 x daily - 4-5 x weekly - 2-3 sets - 10 reps - Standing Tandem Balance with Counter Support  - 1 x daily - 5 x weekly - 1 sets - 2-3 reps - 30-45 seconds hold - Tandem Walking with Counter Support  - 1 x daily - 5-6 x weekly - 3 sets - 10 reps - Mini Squat with Counter Support  - 1 x daily - 7 x weekly - 3 sets - 10 reps - Standing 3-Way Leg Reach with Resistance at Ankles and Counter Support  - 1 x daily - 7 x weekly - 3 sets - 10 reps - Supine Bridge with Mini Swiss Ball Between Knees  - 1 x daily - 7 x weekly - 3 sets - 10 reps - Forward Step Up with Counter Support  - 1 x daily - 7 x weekly - 3 sets - 10 reps - Supine Heel Slide  - 1 x daily - 7 x weekly - 3 sets - 10 reps - Hooklying Single Leg Bent Knee Fallouts with Resistance  - 1 x daily - 7 x weekly - 3 sets - 10 reps - Seated March with Resistance  - 1 x daily - 7 x weekly - 3 sets - 10 reps - Seated Resisted Hip Adduction - 1 x daily - 7 x weekly - 3 sets - 10 reps   GOALS: Goals reviewed with patient? Yes  SHORT TERM GOALS=LONG-TERM GOALS due to length of POC  NEW LONG TERM GOALS: Target date: 09/20/2022  Pt will be independent with final HEP for improved strength, balance, transfers and gait. Baseline:  Goal status: IN PROGRESS  2.  Pt will demonstrate TUG of </=13 seconds in order to decrease risk of falls and improve functional  mobility using LRAD. Baseline: 14.35  sec w/ SPC and R AFO, 14.18 sec with SPC and R AFO (11/30) Goal status: IN PROGRESS  3.  Pt will improve FGA score to >/=20/30 in order to demonstrate improved balance and decreased fall risk. Baseline: 14/30 no AD, 17/30 with SPC (11/30) Goal status: IN PROGRESS  4.  Pt will demonstrate a gait speed of >/=2.75 feet/sec using LRAD and AFO in order to decrease risk for falls. Baseline: 2.65 ft/sec w/ SPC and R AFO, 1.91 ft/sec with SPC and AFO (11/30) Goal status: REVISED   ASSESSMENT:  CLINICAL IMPRESSION: Emphasis of skilled PT session reviewing pt's HEP due to decreased compliance with performance per pt report as pt tends to focus on walking up/down the stairs daily. Encouraged pt to work on his HEP as exercises are chosen to target ongoing weakness in his RLE to improve his overall balance, strength, and decrease his gait deviations. Will add further exercises to HEP next session to continue to target weak muscles.  Continue POC.    OBJECTIVE IMPAIRMENTS: Abnormal gait, decreased balance, decreased coordination, decreased endurance, decreased mobility, and decreased strength.   ACTIVITY LIMITATIONS: carrying, lifting, bending, squatting, stairs, and transfers  PARTICIPATION LIMITATIONS: community activity  PERSONAL FACTORS: Age and 1-2 comorbidities:    hypertension, hyperlipidemia, diabetes mellitus, CKD stage III, CHF with ejection fraction of 25 to 30%, medical noncompliance, recent CVA left hemispheric subcortical region are also affecting patient's functional outcome.   REHAB POTENTIAL: Good  CLINICAL DECISION MAKING: Stable/uncomplicated  EVALUATION COMPLEXITY: Low  PLAN:  PT FREQUENCY: 2x/week  PT DURATION: 6 weeks + 12 visits (recert)  PLANNED INTERVENTIONS: Therapeutic exercises, Therapeutic activity, Neuromuscular re-education, Balance training, Gait training, Patient/Family education, Self Care, Joint mobilization, Stair training, Vestibular training, Canalith  repositioning, Visual/preceptual remediation/compensation, Orthotic/Fit training, DME instructions, Aquatic Therapy, Dry Needling, Electrical stimulation, Cryotherapy, Moist heat, Taping, Manual therapy, and Re-evaluation  PLAN FOR NEXT SESSION: check L BP!, RLE NMR, Hip flexor and HS strengthening, increased step length w/gait, intro basic dual-tasks, reaction time, SLS on RLE, SL bridge on compliant surface vs having LLE elevated, cone taps from rocker board, continue working on stair descent w/ RLE in rear, could try dual task blaze pods on mirror w/ step and reach > compliant surface, add to HEP: gastroc stretch, hamstring strengthening, look at R ankle strength without AFO   Peter Congo, PT, DPT, CSRS 09/13/2022, 11:06 AM

## 2022-09-18 ENCOUNTER — Encounter: Payer: Medicare HMO | Admitting: Occupational Therapy

## 2022-09-18 ENCOUNTER — Ambulatory Visit: Payer: Medicare HMO | Attending: Physician Assistant | Admitting: Physical Therapy

## 2022-09-18 ENCOUNTER — Encounter: Payer: Self-pay | Admitting: Physical Therapy

## 2022-09-18 VITALS — BP 125/77 | HR 78

## 2022-09-18 DIAGNOSIS — I69351 Hemiplegia and hemiparesis following cerebral infarction affecting right dominant side: Secondary | ICD-10-CM | POA: Diagnosis present

## 2022-09-18 DIAGNOSIS — R600 Localized edema: Secondary | ICD-10-CM | POA: Diagnosis present

## 2022-09-18 DIAGNOSIS — R278 Other lack of coordination: Secondary | ICD-10-CM | POA: Diagnosis present

## 2022-09-18 DIAGNOSIS — R2689 Other abnormalities of gait and mobility: Secondary | ICD-10-CM | POA: Insufficient documentation

## 2022-09-18 DIAGNOSIS — G8929 Other chronic pain: Secondary | ICD-10-CM | POA: Insufficient documentation

## 2022-09-18 DIAGNOSIS — M25631 Stiffness of right wrist, not elsewhere classified: Secondary | ICD-10-CM | POA: Insufficient documentation

## 2022-09-18 DIAGNOSIS — I63512 Cerebral infarction due to unspecified occlusion or stenosis of left middle cerebral artery: Secondary | ICD-10-CM | POA: Diagnosis present

## 2022-09-18 DIAGNOSIS — I6381 Other cerebral infarction due to occlusion or stenosis of small artery: Secondary | ICD-10-CM | POA: Diagnosis present

## 2022-09-18 DIAGNOSIS — M25511 Pain in right shoulder: Secondary | ICD-10-CM | POA: Insufficient documentation

## 2022-09-18 DIAGNOSIS — M6281 Muscle weakness (generalized): Secondary | ICD-10-CM

## 2022-09-18 DIAGNOSIS — R2681 Unsteadiness on feet: Secondary | ICD-10-CM | POA: Diagnosis present

## 2022-09-18 NOTE — Therapy (Signed)
OUTPATIENT PHYSICAL THERAPY NEURO TREATMENT   Patient Name: Joseph Patrick MRN: [id number] DOB:1945/12/20, 77 y.o., male Today's Date: 09/19/2022   PCP: Arthur Holms NP REFERRING PROVIDER: Garvin Fila, MD   PT End of Session - 09/18/22 1026     Visit Number 16    Number of Visits 21   recert   Date for PT Re-Evaluation 09/27/22   to allow for scheduling conflicts   Authorization Type [employer]    Authorization Time Period 08/16/22-09/27/22    Progress Note Due on Visit 20    PT Start Time 1020    PT Stop Time 1102    PT Time Calculation (min) 42 min    Equipment Utilized During Treatment Gait belt    Activity Tolerance Patient tolerated treatment well    Behavior During Therapy WFL for tasks assessed/performed                       Past Medical History:  Diagnosis Date   CVA (cerebral vascular accident) [facility])    april 2023   Diabetes mellitus without complication ([place])    Hypertension    History reviewed. No pertinent surgical history. 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 ([place]) 01/14/2022   HFrEF (heart failure with reduced ejection fraction) ([place])    Essential hypertension 01/07/2022   Hyperlipidemia 01/07/2022   Diabetes ([place]) 01/07/2022   Stage 3a chronic kidney disease (CKD) ([place]) 01/07/2022   Transient cerebral ischemia 01/06/2022    ONSET DATE: 07/12/2022  REFERRING DIAG: S17.793 (ICD-10-CM) - Spastic hemiplegia of right dominant side as late effect of cerebral infarction (HCC) I63.81 (ICD-10-CM) - Lacunar stroke (HCC)  THERAPY DIAG:  Muscle weakness (generalized)  Other lack of coordination  Unsteadiness on feet  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  He has not been exercising his arm much over the holiday.  Pt states he feels that his stroke side is not changing much over the last month and feels this is going to stay this way.  He is agreeable to re-certing for 1x/wk for 4 wks next session.  Pt accompanied by:  family member (niece [name])  PERTINENT HISTORY: hypertension, hyperlipidemia, diabetes mellitus, CKD stage III, CHF with ejection fraction of 25 to 30%, medical noncompliance, recent CVA left hemispheric subcortical  PAIN:  Are you having pain? No  VITALS (LUE in Sitting): Vitals:   09/18/22 1024  BP: 125/77  Pulse: 78    PRECAUTIONS: Fall  WEIGHT BEARING RESTRICTIONS: No  FALLS: Has patient fallen in last 6 months? Yes. Number of falls 1 in August that led to R shoulder injury (tear in rotator cuff muscles)  LIVING ENVIRONMENT: Lives with: lives alone Lives in: House/apartment Stairs: No (can use elevator) Has following equipment at home: Single point cane  PLOF: Independent with gait, Independent with transfers, and Requires assistive device for independence  PATIENT GOALS: "strengthen my right leg and my right arm" "I want to be able to swing a golf club with RUE"  OBJECTIVE:  THER EX: -SciFit in hill mode on level 6.0 for 8 minutes using BUE/BLEs for neural priming for reciprocal movement, dynamic cardiovascular warmup and increased amplitude of stepping. RPE of 5/10 at onset of task and 6/10 following activity.  Pt denies pain in RUE, monitored without change throughout, mild crepitus noted at onset of task.  -Golf swing (using 7 iron) w/o target standing wide stance on firm surface w/ pt practicing arc of available motion w/ PT assessing positioning of R shoulder, no evidence of overt anterior  bulge indicating shoulder dislocation risk w/ draw back and pt able to push through to end of swing, on repeated trials PT and pt changed trunk rotation and stance to improve form w/o stressing RUE.  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  Progressed to swing w/ large unweighted ball target > regressed to swinging against grip pad to practice proximity to the ground and club rotation control;  pt reports improvement from prior attempts, instructed in rotation and weight shift to improve compensation in leu of proper shoulder elevation > progressed trials to pt standing on blue mat then [employer] static balance challenge  PATIENT EDUCATION: Education details: Continue HEP.  Safety w/ return to gentle putting over time (PT discussed ongoing mechanics corrections in OT/PT session w/ primary OT as well).  Discussion of mechanics and goals w/ return to golfing (see above). Person educated: Patient and niece [name] Education method: Explanation, Demonstration, and Handouts Education comprehension: verbalized understanding  HOME EXERCISE PROGRAM: Access Code: [email or web address] URL: [email or web address] Date: 07/25/2022 Prepared by: Camille Bal  Exercises - Staggered Sit-to-Stand  - 1 x daily - 4-5 x weekly - 2-3 sets - 10 reps - Standing Tandem Balance with Counter Support  - 1 x daily - 5 x weekly - 1 sets - 2-3 reps - 30-45 seconds hold - Tandem Walking with Counter Support  - 1 x daily - 5-6 x weekly - 3 sets - 10 reps - Mini Squat with Counter Support  - 1 x daily - 7 x weekly - 3 sets - 10 reps - Standing 3-Way Leg Reach with Resistance at Ankles and Counter Support  - 1 x daily - 7 x weekly - 3 sets - 10 reps - Supine Bridge with Mini Swiss Ball Between Knees  - 1 x daily - 7 x weekly - 3 sets - 10 reps - Forward Step Up with Counter  Support  - 1 x daily -  7 x weekly - 3 sets - 10 reps - Supine Heel Slide  - 1 x daily - 7 x weekly - 3 sets - 10 reps - Hooklying Single Leg Bent Knee Fallouts with Resistance  - 1 x daily - 7 x weekly - 3 sets - 10 reps - Seated March with Resistance  - 1 x daily - 7 x weekly - 3 sets - 10 reps - Seated Resisted Hip Adduction - 1 x daily - 7 x weekly - 3 sets - 10 reps   GOALS: Goals reviewed with patient? Yes  SHORT TERM GOALS=LONG-TERM GOALS due to length of POC  NEW LONG TERM GOALS: Target date: 09/20/2022  Pt will be independent with final HEP for improved strength, balance, transfers and gait. Baseline:  Goal status: IN PROGRESS  2.  Pt will demonstrate TUG of </=13 seconds in order to decrease risk of falls and improve functional mobility using LRAD. Baseline: 14.35 sec w/ SPC and R AFO, 14.18 sec with SPC and R AFO (11/30) Goal status: IN PROGRESS  3.  Pt will improve FGA score to >/=20/30 in order to demonstrate improved balance and decreased fall risk. Baseline: 14/30 no AD, 17/30 with SPC (11/30) Goal status: IN PROGRESS  4.  Pt will demonstrate a gait speed of >/=2.75 feet/sec using LRAD and AFO in order to decrease risk for falls. Baseline: 2.65 ft/sec w/ SPC and R AFO, 1.91 ft/sec with SPC and AFO (11/30) Goal status: REVISED   ASSESSMENT:  CLINICAL IMPRESSION: Emphasis of skilled PT session on initiating return of pt to preferred hobby.  Extensive practice for mechanics of swing as pt has permanent right shoulder deficit reducing shoulder elevation and arc of motion of swing.  Pt presents with realistic goals for gradual return to non-competitive capacity of sport.  Will continue to integrate task into further PT sessions for both salience and safe independence w/ task.    OBJECTIVE IMPAIRMENTS: Abnormal gait, decreased balance, decreased coordination, decreased endurance, decreased mobility, and decreased strength.   ACTIVITY LIMITATIONS: carrying, lifting,  bending, squatting, stairs, and transfers  PARTICIPATION LIMITATIONS: community activity  PERSONAL FACTORS: Age and 1-2 comorbidities:    hypertension, hyperlipidemia, diabetes mellitus, CKD stage III, CHF with ejection fraction of 25 to 30%, medical noncompliance, recent CVA left hemispheric subcortical region are also affecting patient's functional outcome.   REHAB POTENTIAL: Good  CLINICAL DECISION MAKING: Stable/uncomplicated  EVALUATION COMPLEXITY: Low  PLAN:  PT FREQUENCY: 2x/week  PT DURATION: 6 weeks + 12 visits (recert)  PLANNED INTERVENTIONS: Therapeutic exercises, Therapeutic activity, Neuromuscular re-education, Balance training, Gait training, Patient/Family education, Self Care, Joint mobilization, Stair training, Vestibular training, Canalith repositioning, Visual/preceptual remediation/compensation, Orthotic/Fit training, DME instructions, Aquatic Therapy, Dry Needling, Electrical stimulation, Cryotherapy, Moist heat, Taping, Manual therapy, and Re-evaluation  PLAN FOR NEXT SESSION: check L BP!, RLE NMR, Hip flexor and HS strengthening, increased step length w/gait, intro basic dual-tasks, reaction time, SLS on RLE, SL bridge on compliant surface vs having LLE elevated, cone taps from rocker board, continue working on stair descent w/ RLE in rear, could try dual task blaze pods on mirror w/ step and reach > compliant surface, add to HEP: gastroc stretch, hamstring strengthening, look at R ankle strength without AFO, golf swing on grass/incline, ASSESS LTGs -re-cert for 6/0V for 4 wks, exercises like trunk rotation and arc of motion of golf swing   Bary Richard, PT, DPT 09/19/2022, 5:21 PM

## 2022-09-20 ENCOUNTER — Ambulatory Visit: Payer: Medicare HMO | Admitting: Physical Therapy

## 2022-09-20 ENCOUNTER — Ambulatory Visit: Payer: Medicare HMO | Admitting: Occupational Therapy

## 2022-09-20 ENCOUNTER — Encounter: Payer: Self-pay | Admitting: Physical Therapy

## 2022-09-20 VITALS — BP 154/66 | HR 73

## 2022-09-20 DIAGNOSIS — R2681 Unsteadiness on feet: Secondary | ICD-10-CM

## 2022-09-20 DIAGNOSIS — I69351 Hemiplegia and hemiparesis following cerebral infarction affecting right dominant side: Secondary | ICD-10-CM

## 2022-09-20 DIAGNOSIS — M6281 Muscle weakness (generalized): Secondary | ICD-10-CM

## 2022-09-20 DIAGNOSIS — R278 Other lack of coordination: Secondary | ICD-10-CM

## 2022-09-20 DIAGNOSIS — M25631 Stiffness of right wrist, not elsewhere classified: Secondary | ICD-10-CM

## 2022-09-20 DIAGNOSIS — R600 Localized edema: Secondary | ICD-10-CM

## 2022-09-20 DIAGNOSIS — G8929 Other chronic pain: Secondary | ICD-10-CM

## 2022-09-20 NOTE — Therapy (Signed)
OUTPATIENT PHYSICAL THERAPY NEURO TREATMENT   Patient Name: Joseph Patrick MRN: [id number] DOB:1946-03-25, 77 y.o., male Today's Date: 09/21/2022   PCP: Arthur Holms NP REFERRING PROVIDER: Garvin Fila, MD   PT End of Session - 09/20/22 1030     Visit Number 17    Number of Visits 25   21+4   Date for PT Re-Evaluation 40/98/11   re-cert completed on 05/18/4781   Authorization Type [employer]    Authorization Time Period 08/16/22-09/27/22    Progress Note Due on Visit 20    PT Start Time 1018    PT Stop Time 1100    PT Time Calculation (min) 42 min    Equipment Utilized During Treatment Gait belt    Activity Tolerance Patient tolerated treatment well    Behavior During Therapy WFL for tasks assessed/performed                        Past Medical History:  Diagnosis Date   CVA (cerebral vascular accident) ([place])    april 2023   Diabetes mellitus without complication ([place])    Hypertension    History reviewed. No pertinent surgical history. 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 ([place]) 01/14/2022   HFrEF (heart failure with reduced ejection fraction) ([place])    Essential hypertension 01/07/2022   Hyperlipidemia 01/07/2022   Diabetes ([place]) 01/07/2022   Stage 3a chronic kidney disease (CKD) ([place]) 01/07/2022   Transient cerebral ischemia 01/06/2022    ONSET DATE: 07/12/2022  REFERRING DIAG: N56.213 (ICD-10-CM) - Spastic hemiplegia of right dominant side as late effect of cerebral infarction (HCC) I63.81 (ICD-10-CM) - Lacunar stroke (HCC)  THERAPY DIAG:  Muscle weakness (generalized) - Plan: PT plan of care cert/re-cert  Other lack of coordination - Plan: PT plan of care cert/re-cert  Unsteadiness on feet - Plan: PT plan of care cert/re-cert  Spastic hemiplegia of right dominant side as late effect of cerebral infarction ([place])  - Plan: PT plan of care cert/re-cert  Rationale for Evaluation and Treatment: Rehabilitation  SUBJECTIVE:                                                                                                                                                                                             SUBJECTIVE STATEMENT: Pt states he did 40 stairs this morning.    He is agreeable to re-certing for 1x/wk for 4 wks next session.  He denies pain or falls.  Pt accompanied by: family member (  niece [name])  PERTINENT HISTORY: hypertension, hyperlipidemia, diabetes mellitus, CKD stage III, CHF with ejection fraction of 25 to 30%, medical noncompliance, recent CVA left hemispheric subcortical  PAIN:  Are you having pain? No  VITALS (LUE in Sitting): Vitals:   09/20/22 1025  BP: (!) 154/66  Pulse: 73    PRECAUTIONS: Fall  WEIGHT BEARING RESTRICTIONS: No  FALLS: Has patient fallen in last 6 months? Yes. Number of falls 1 in August that led to R shoulder injury (tear in rotator cuff muscles)  LIVING ENVIRONMENT: Lives with: lives alone Lives in: House/apartment Stairs: No (can use elevator) Has following equipment at home: Single point cane  PLOF: Independent with gait, Independent with transfers, and Requires assistive device for independence  PATIENT GOALS: "strengthen my right leg and my right arm" "I want to be able to swing a golf club with RUE"  OBJECTIVE:  THER ACT: -Discussed re-cert process and scheduling going forward. LTG assessment: -Verbally reviewed HEP, pt states the tandem stance is the most challenging, but the rest are becoming easier. -TUG w/ SPC:  13.00 sec -FGA:  [facility] PT Assessment - 09/20/22 1045       Functional Gait  Assessment   Gait assessed  Yes    Gait Level Surface Walks 20 ft in less than 7 sec but greater than 5.5 sec, uses assistive device, slower speed, mild gait deviations, or deviates 6-10 in outside of the 12 in walkway width.    Change in Gait  Speed Able to change speed, demonstrates mild gait deviations, deviates 6-10 in outside of the 12 in walkway width, or no gait deviations, unable to achieve a major change in velocity, or uses a change in velocity, or uses an assistive device.    Gait with Horizontal Head Turns Performs head turns smoothly with slight change in gait velocity (eg, minor disruption to smooth gait path), deviates 6-10 in outside 12 in walkway width, or uses an assistive device.    Gait with Vertical Head Turns Performs task with slight change in gait velocity (eg, minor disruption to smooth gait path), deviates 6 - 10 in outside 12 in walkway width or uses assistive device    Gait and Pivot Turn Turns slowly, requires verbal cueing, or requires several small steps to catch balance following turn and stop    Step Over Obstacle Is able to step over one shoe box (4.5 in total height) without changing gait speed. No evidence of imbalance.    Gait with Narrow Base of Support Ambulates less than 4 steps heel to toe or cannot perform without assistance.    Gait with Eyes Closed Walks 20 ft, uses assistive device, slower speed, mild gait deviations, deviates 6-10 in outside 12 in walkway width. Ambulates 20 ft in less than 9 sec but greater than 7 sec.    Ambulating Backwards Walks 20 ft, uses assistive device, slower speed, mild gait deviations, deviates 6-10 in outside 12 in walkway width.    Steps Alternating feet, must use rail.    Total Score 17    FGA comment: 17/30 = high fall risk; w/ SPC            -10MWT w/ SPC:  11.63 sec = 0.86 m/sec OR 2.84 ft/sec  PATIENT EDUCATION: Education details: Continue HEP.  Process for re-cert and progress towards goals. Person educated: Patient and niece [name] Education method: Explanation, Demonstration, and Handouts Education comprehension: verbalized understanding  HOME EXERCISE PROGRAM: Access Code: [email or web address] URL: [email or web address] Date:  07/25/2022 Prepared by: Elease Etienne  Exercises - Staggered Sit-to-Stand  - 1 x daily - 4-5 x weekly - 2-3 sets - 10 reps - Standing Tandem Balance with Counter Support  - 1 x daily - 5 x weekly - 1 sets - 2-3 reps - 30-45 seconds hold - Tandem Walking with Counter Support  - 1 x daily - 5-6 x weekly - 3 sets - 10 reps - Mini Squat with Counter Support  - 1 x daily - 7 x weekly - 3 sets - 10 reps - Standing 3-Way Leg Reach with Resistance at Ankles and Counter Support  - 1 x daily - 7 x weekly - 3 sets - 10 reps - Supine Bridge with Mini Swiss Ball Between Knees  - 1 x daily - 7 x weekly - 3 sets - 10 reps - Forward Step Up with Counter Support  - 1 x daily - 7 x weekly - 3 sets - 10 reps - Supine Heel Slide  - 1 x daily - 7 x weekly - 3 sets - 10 reps - Hooklying Single Leg Bent Knee Fallouts with Resistance  - 1 x daily - 7 x weekly - 3 sets - 10 reps - Seated March with Resistance  - 1 x daily - 7 x weekly - 3 sets - 10 reps - Seated Resisted Hip Adduction - 1 x daily - 7 x weekly - 3 sets - 10 reps   GOALS: Goals reviewed with patient? Yes  SHORT TERM GOALS=LONG-TERM GOALS due to length of POC  NEW LONG TERM GOALS: Target date: 09/20/2022  Pt will be independent with final HEP for improved strength, balance, transfers and gait. Baseline: Established, pt compliant, needs advancement. Goal status: MET  2.  Pt will demonstrate TUG of </=13 seconds in order to decrease risk of falls and improve functional mobility using LRAD. Baseline: 14.35 sec w/ SPC and R AFO, 14.18 sec with SPC and R AFO (11/30); 13.00 sec w/ SPC and R AFO (1/4) Goal status: MET  3.  Pt will improve FGA score to >/=20/30 in order to demonstrate improved balance and decreased fall risk. Baseline: 14/30 no AD, 17/30 with SPC (11/30); 17/30 w/ SPC (1/4) Goal status: NOT MET  4.  Pt will demonstrate a gait speed of >/=2.75 feet/sec using LRAD and AFO in order to decrease risk for falls. Baseline: 2.65 ft/sec  w/ SPC and R AFO, 1.91 ft/sec with SPC and AFO (11/30); 2.84 ft/sec w/ SPC and R AFO (1/4) Goal status:  MET  NEW GOALS: Goals reviewed with patient? Yes  SHORT TERM GOALS=LONG-TERM GOALS due to length of POC  NEW LONG TERM GOALS: Target date: 10/19/2022  Pt will be independent with final HEP for improved strength, balance, transfers and gait. Baseline: Will review and advance. Goal status: INITIAL  2.  Pt will improve FGA score to >/=19/30 with use of AD as appropriate in order to demonstrate improved balance and decreased fall risk. Baseline: 17/30 w/ SPC (1/4) Goal status: INITIAL  3.  Pt will demonstrate a gait speed of >/=3.0 feet/sec using LRAD and AFO in order to decrease risk for falls. Baseline: 2.84 ft/sec w/ SPC and R AFO (1/4) Goal status:  INITIAL  ASSESSMENT:  CLINICAL IMPRESSION: Assessed LTGs this visit in preparation for re-cert to focus on finalizing HEP and safety with return to regular physical activity and ADLs.  Pt is WNL completing TUG in 13.00 seconds this visit.  He continues to ambulate w/ SPC  and right posterior AFO, but improved gait speed to 2.84 ft/sec.  His FGA score remains unchanged w/ use of AD.  Will complete re-cert to continue w/ ongoing POC.    OBJECTIVE IMPAIRMENTS: Abnormal gait, decreased balance, decreased coordination, decreased endurance, decreased mobility, and decreased strength.   ACTIVITY LIMITATIONS: carrying, lifting, bending, squatting, stairs, and transfers  PARTICIPATION LIMITATIONS: community activity  PERSONAL FACTORS: Age and 1-2 comorbidities:    hypertension, hyperlipidemia, diabetes mellitus, CKD stage III, CHF with ejection fraction of 25 to 30%, medical noncompliance, recent CVA left hemispheric subcortical region are also affecting patient's functional outcome.   REHAB POTENTIAL: Good  CLINICAL DECISION MAKING: Stable/uncomplicated  EVALUATION COMPLEXITY: Low  PLAN:  PT FREQUENCY: 1x/week  PT DURATION: 6 weeks  + 12 visits (recert) + 4 visits (at second re-cert)  PLANNED INTERVENTIONS: Therapeutic exercises, Therapeutic activity, Neuromuscular re-education, Balance training, Gait training, Patient/Family education, Self Care, Joint mobilization, Stair training, Vestibular training, Canalith repositioning, Visual/preceptual remediation/compensation, Orthotic/Fit training, DME instructions, Aquatic Therapy, Dry Needling, Electrical stimulation, Cryotherapy, Moist heat, Taping, Manual therapy, and Re-evaluation  PLAN FOR NEXT SESSION: check L BP!, RLE NMR, Hip flexor and HS strengthening, reaction time, SLS on RLE, SL bridge on compliant surface vs having LLE elevated, cone taps from rocker board, add to HEP: gastroc stretch, hamstring strengthening, golf swing on grass/incline, Review and finalize/advance HEP (may need to condense), exercises like trunk rotation and arc of motion of golf swing, pt wants to work on reaching to ground (compensating w/ body when reaching with RUE) from standing   Bary Richard, PT, DPT 09/21/2022, 10:33 AM

## 2022-09-20 NOTE — Therapy (Signed)
OUTPATIENT OCCUPATIONAL THERAPY TREATMENT NOTE  Patient Name: Joseph Patrick MRN: [id number] DOB:20-Apr-1946, 77 y.o., male Today's Date: 09/20/2022  PCP: Arthur Holms, NP REFERRING PROVIDER: Leandrew Koyanagi, MD     OT End of Session - 09/20/22 1226     Visit Number 17    Number of Visits 24    Date for OT Re-Evaluation 10/12/21    Authorization Type [employer]    OT Start Time 1103    OT Stop Time 1143    OT Time Calculation (min) 40 min    Activity Tolerance Patient tolerated treatment well;Patient limited by fatigue;Patient limited by pain    Behavior During Therapy [facility] for tasks assessed/performed               Past Medical History:  Diagnosis Date   CVA (cerebral vascular accident) [facility])    april 2023   Diabetes mellitus without complication ([place])    Hypertension    No past surgical history on file. 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 ([place]) 01/14/2022   HFrEF (heart failure with reduced ejection fraction) ([place])    Essential hypertension 01/07/2022   Hyperlipidemia 01/07/2022   Diabetes ([place]) 01/07/2022   Stage 3a chronic kidney disease (CKD) ([place]) 01/07/2022   Transient cerebral ischemia 01/06/2022    ONSET DATE:  July 16th, 2023 (about 12 weeks since injury)   REFERRING DIAG: M25.531 (ICD-10-CM) - Pain in right wrist  THERAPY DIAG:  Muscle weakness (generalized)  Other lack of coordination  Unsteadiness on feet  Spastic hemiplegia of right dominant side as late effect of cerebral infarction (HCC)  Stiffness of right wrist, not elsewhere classified  Localized edema  Chronic right shoulder pain  Rationale for Evaluation and Treatment Rehabilitation  PERTINENT HISTORY: Per medical notes, he fell ~ 2 months ago, tearing (complete supraspinatus) Rt RTC, tore Rt TFCC as well as S-L ligament.  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 He states falling ~2 months ago and wrist bend into flexion and he tore ligaments in his wrist. He states his right hand is now swollen and stiff. He also states having a stroke this April (about 6 months ago) that affected Rt side, but he states rehabbing back to "normal" before fall. He states no significant pain at fall and none now- just swollen, tight, stiff and also poorer coordination now. He states not wearing any wrist brace after fall, tried a sling for his shoulder, which he felt "made him worse," so d/c'd it.   His niece is with him today to support him."  PRECAUTIONS: Fall and Other: Rt RTC tears   WEIGHT BEARING RESTRICTIONS Yes caution for Rt Shoulder and wrist   SUBJECTIVE:   SUBJECTIVE STATEMENT: Pt states he can't touch his pinky  Pt accompanied by: niece  PAIN:  Are you having pain?  No  FALLS: Has patient fallen in last 6 months? Yes. Number of falls 1 - this injury  LIVING ENVIRONMENT: Lives with: Alone. He states driving since stroke. He uses a cane for stability.    OBJECTIVE: (All objective assessments below are from initial evaluation on: 06/28/22 unless otherwise specified.)    HAND DOMINANCE: Right   ADLs: Overall ADLs: He states problems with dressing, bathing, turning door handles, and FMS buttoning with Rt hand now.   FUNCTIONAL OUTCOME  MEASURES: 07/24/22: PRWE: Pain: 3/50; Function: 18.5/40; Total Score: 21.5/90 (Higher Score  =  More Pain and/or Debility)   Eval: Patient Rated Wrist Evaluation (PRWE) (modified to exclude work and recreational activities as he states not doing at baseline): Pain: 0/50; Function: 19.5/40; Total Score: 19.5/90 (Higher Score  =  More Pain and/or Debility)    UPPER EXTREMITY ROM     Shoulder to Wrist AROM Right eval Left eval Rt 07/10/22 Rt 07/24/22 Rt  12/12  Forearm supination 63   56 84  Forearm pronation  80   83 84  Wrist flexion 37*  (tender) (40* PROM)  50* 47 35 54  Wrist extension 28* (40* PROM)  68* 36 42 44  Wrist ulnar deviation 7*  19 25   Wrist radial deviation 5*  14 15   Functional dart thrower's motion (F-DTM) in ulnar flexion 25*      F-DTM in radial extension  27*      (Blank rows = not tested)   Hand AROM Right eval Right 07/24/22  Full Fist Ability (or Gap to Distal Palmar Crease) Loose fist (fingers barely touch palm Still can touch palm with all fingers with effort  Thumb Opposition to Small Finger (or Gap) 1.5cm gap and slow motion ~1.5 - 2cm gap to oppose  Thumb Opposition to Base of Small Finger (or Gap)  unable unable  (Blank rows = not tested)   UPPER EXTREMITY MMT:    Eval:  NT at eval due to concern for ligament tears, however he was stable and non-painful and this will be tested next session to help "tease out" CVA type symptoms as well. Definite weakness Rt vs Lt.   MMT Right 07/03/22 Rt 07/24/22  Elbow flexion 5/5 good tone, poor endurance 4+/5 MMT  Elbow extension 4-/5 lower tone 4+/5 MMT  Forearm supination 4+/5 good tone 4+/5 MMT  Forearm pronation 4+/5 good tone 5/5 MMY  Wrist flexion 4+/5 4+/5 MMT  Wrist/finger extension 3+/5 4-/5 MMT  (Blank rows = not tested)  HAND FUNCTION: 07/24/22: Rt grip today: 25.7#  07/03/22: Rt 19# grip (no pain); Lt: 118#    COORDINATION: 07/24/22: Box & Blocks Test Right 33 Blocks Today (46 is WFL)  9HPT: 9HPT: Rt 73mn 7sec   07/03/22: Box & Blocks Test Right 21 Blocks Today (46 is WFL)   Eval: He has observed ataxic motion with finger to nose test, observed slow and poor FMS in Rt hand compared to Lt.  9HPT: Rt 134m 46sec with several pegs dropped.   SENSATION: Eval:  Light touch intact today, he states equal b/l   EDEMA:   07/03/22: Rt hand figure of 8:   48.7cm today  (Lt hand is 48.8cm);  Rt IF base 7.5cm (Lt hand is 7.2cm)   COGNITION: 07/24/22: more overt memory issues since start of care, he can't recall doing box and blocks  before, he forgets mentioning lateral shoulder pain, seems to discuss the same things over during each session, etc.   Eval: Overall cognitive status[facility], though states some memory issues  OBSERVATIONS:   Eval: no facial droop, but Rt sided ataxia present in UE and LE, poor tone and coordination noted as well.  Concern for exacerbation of CVA as main issue vs ligament tears and orthopedic issues.  (Rt wrist seems stable, negative Watson's Test, no DRUJ instability, negative TFCC shear test)   TODAY'S TREATMENT:  Standing to place and remove large to small pegs into semicircle pegboard  then to remove with RUE, min-mod v.c for opening hand, tip or 3 pt pinch and to avoid shoulder abduction. Standing to place/ remove yellow to green clothespins on vertical antennae with RUE min v.c for pinch and to avoid compensation Closed chain shoulder flexion with cane reaching towards feet for shoulder flexion and elbow extension, min v.c Wiping tabletop for tableslides in shoulder flexion and abduction, pt has clicking sound from shoulder but reports no pain. Pt's niece said that ortho said it was related to pt's cartilage but no new precautions. Pt attempted to rotate ball in finger tips, however this was too challenging yet he was able to rotate on tabletop with RUE. Low range shoulder flexion and circumduction with RUE supported on vertical cane , min v.c  PATIENT EDUCATION: Education details: See tx section above for details  Person educated: Patient; niece Education method: Verbal Instruction, Demonstration Education comprehension: returned demonstration, min v.c  HOME EXERCISE PROGRAM:  Access Code: [email or web address] URL: [email or web address] Exercises: - Seated Shoulder External Rotation PROM on Table  - 3-4 x daily - 3-5 reps - 15-20 sec hold - Sleeper Stretch  - 3-4 x daily - 5 reps - 15-20 sec hold - Seated Single Arm Shoulder PNF D1 Flexion  - 4-6 x daily - 10-15  reps - Wrist Flexion Stretch  - 4 x daily - 3-5 reps - 15 sec hold - Wrist Extension Stretch Pronated  - 4 x daily - 3-5 reps - 15 hold - Bend and Pull Back Wrist SLOWLY  - 4 x daily - 10-15 reps - Tendon Glides  - 4-6 x daily - 3-5 reps - 2-3 seconds hold - Thumb Opposition  - 4-6 x daily - 10 reps - Seated Single Finger Extension  - 4-6 x daily - 10-15 reps - Full Fist  - 2-3 x daily - 5 reps - Finger Extension "Pizza!"   - 2-3 x daily - 5 reps - Finger Pinch and Pull with Putty  - 2-3 x daily - 5 reps - Wrist Extension with Resistance  - 2-4 x daily - 1-2 sets - 10-15 reps - Wrist Flexion with Resistance  - 2-4 x daily - 1-2 sets - 10-15 reps   GOALS: Goals reviewed with patient? Yes   SHORT TERM GOALS: (STG required if POC>30 days) Target Date: 07/13/22  Pt will obtain protective, custom orthotic. Goal status: D/C he doesn't need at this point   2.  Pt will demo/state understanding of initial HEP to improve pain levels and prerequisite motion. Goal status: 07/24/22: MET but he cannot recall most today    LONG TERM GOALS: Target Date: 09/07/22 Pt will improve functional ability by decreased impairment per PRWE functional assessment from 19.5/40 to 10/40 or better, for better quality of life. Goal status: 07/24/22: 18.5 today, slightly better, admits to favoring Lt hand and not using right hand at times.   2.  Pt will improve grip strength in Rt hand to at least 40lbs for functional use at home and in IADLs. Goal status:  07/24/22: 25# today 12/15 - 25.5 today  3.  Pt will improve A/ROM in Rt wrist flex & ext from 37/28* respectively, to at least 40* both, to have functional motion for tasks like reach and grasp.  Goal status:  MET 07/24/22: now 35* / 42* respectively  12/12 - 54/44*  4.  Pt will improve strength in Rt wrist flex, ext to at least 4+/5 MMT to have increased functional ability to carry out selfcare  and higher-level homecare tasks with no difficulty. Goal status:  07/24/22: now 4+/5 flexion, but 4-/5 ext  5.  Pt will improve coordination skills in Rt hand, as seen by better score on 9HPT testing to at least 55sec to have increased functional ability to carry out fine motor tasks (fasteners, etc.) and more complex, coordinated IADLs (meal prep, sports, etc.).  Goal status: 07/24/22: today 67 sec     ASSESSMENT:  CLINICAL IMPRESSION: Pt continues to benefit from skilled OT services to improve edema, ROM, strength, and coordination in RUE as needed to complete ADLs and IADLs more independently and safely.   PLAN: OT FREQUENCY: 2x/week   OT DURATION: 24 visits through 10/12/2022   PLANNED INTERVENTIONS: self care/ADL training, therapeutic exercise, therapeutic activity, neuromuscular re-education, manual therapy, passive range of motion, functional mobility training, splinting, electrical stimulation, ultrasound, fluidotherapy, compression bandaging, moist heat, cryotherapy, contrast bath, patient/family education, and coping strategies training  RECOMMENDED OTHER SERVICES: is getting PT for shoulder tears right now. PT was recommended to assess and tx for LE weakness/stroke signs as well. He was also recommended to see cardiologist/neurologist about possible stroke evolution.   CONSULTED AND AGREED WITH PLAN OF CARE: Patient and family member/caregiver  PLAN FOR NEXT SESSION:  Work toward LTG - review red putty HEP; check long term goal progress, low range functional reach avoiding compensation  Aggie Douse, OT 09/20/2022, 12:46 PM

## 2022-09-25 ENCOUNTER — Ambulatory Visit: Payer: Self-pay | Admitting: Physical Therapy

## 2022-09-25 ENCOUNTER — Encounter: Payer: Self-pay | Admitting: Occupational Therapy

## 2022-09-25 ENCOUNTER — Ambulatory Visit: Payer: Medicare HMO | Admitting: Occupational Therapy

## 2022-09-25 DIAGNOSIS — I63512 Cerebral infarction due to unspecified occlusion or stenosis of left middle cerebral artery: Secondary | ICD-10-CM

## 2022-09-25 DIAGNOSIS — M6281 Muscle weakness (generalized): Secondary | ICD-10-CM | POA: Diagnosis not present

## 2022-09-25 DIAGNOSIS — I69351 Hemiplegia and hemiparesis following cerebral infarction affecting right dominant side: Secondary | ICD-10-CM

## 2022-09-25 DIAGNOSIS — I6381 Other cerebral infarction due to occlusion or stenosis of small artery: Secondary | ICD-10-CM

## 2022-09-25 DIAGNOSIS — R600 Localized edema: Secondary | ICD-10-CM

## 2022-09-25 DIAGNOSIS — R278 Other lack of coordination: Secondary | ICD-10-CM

## 2022-09-25 DIAGNOSIS — M25631 Stiffness of right wrist, not elsewhere classified: Secondary | ICD-10-CM

## 2022-09-25 DIAGNOSIS — G8929 Other chronic pain: Secondary | ICD-10-CM

## 2022-09-25 NOTE — Patient Instructions (Signed)
      You can place your arms out to the sides with pillows underneath or place a pillow across your stomach with your hands resting on top for support. Hold a pillow with your arms away from your body.  Do not bend your arms at the elbows or tuck your hands under your head. Do not place your hand on top or underneath pillow. Use outside of pillow as a border.      

## 2022-09-25 NOTE — Therapy (Signed)
OUTPATIENT OCCUPATIONAL THERAPY PROGRESS NOTE  Patient Name: Khail Zaman MRN: [id number] DOB:11-22-1945, 77 y.o., male Today's Date: 09/25/2022  PCP: Arthur Holms, NP REFERRING PROVIDER: Leandrew Koyanagi, MD     OT End of Session - 09/25/22 1100     Visit Number 18    Number of Visits 24    Date for OT Re-Evaluation 10/12/21    Authorization Type [employer]    Progress Note Due on Visit 18    OT Start Time 1103    OT Stop Time 1146    OT Time Calculation (min) 43 min    Activity Tolerance Patient tolerated treatment well;Patient limited by fatigue;Patient limited by pain    Behavior During Therapy [facility] for tasks assessed/performed             Past Medical History:  Diagnosis Date   CVA (cerebral vascular accident) [facility])    april 2023   Diabetes mellitus without complication ([place])    Hypertension    History reviewed. No pertinent surgical history. 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 ([place]) 01/14/2022   HFrEF (heart failure with reduced ejection fraction) ([place])    Essential hypertension 01/07/2022   Hyperlipidemia 01/07/2022   Diabetes ([place]) 01/07/2022   Stage 3a chronic kidney disease (CKD) ([place]) 01/07/2022   Transient cerebral ischemia 01/06/2022    ONSET DATE:  July 16th, 2023 (about 12 weeks since injury)   REFERRING DIAG: M25.531 (ICD-10-CM) - Pain in right wrist  THERAPY DIAG:  Muscle weakness (generalized)  Other lack of coordination  Spastic hemiplegia of right dominant side as late effect of cerebral infarction (HCC)  Stiffness of right wrist, not elsewhere classified  Localized edema  Chronic right shoulder pain  Acute ischemic left MCA stroke ([place])  Lacunar stroke ([place])  Rationale for Evaluation and Treatment Rehabilitation  PERTINENT HISTORY: Per medical notes, he fell ~ 2 months ago, tearing  (complete supraspinatus) Rt RTC, tore Rt TFCC as well as S-L ligament.  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 He states falling ~2 months ago and wrist bend into flexion and he tore ligaments in his wrist. He states his right hand is now swollen and stiff. He also states having a stroke this April (about 6 months ago) that affected Rt side, but he states rehabbing back to "normal" before fall. He states no significant pain at fall and none now- just swollen, tight, stiff and also poorer coordination now. He states not wearing any wrist brace after fall, tried a sling for his shoulder, which he felt "made him worse," so d/c'd it.   His niece is with him today to support him."  PRECAUTIONS: Fall and Other: Rt RTC tears   WEIGHT BEARING RESTRICTIONS Yes caution for Rt Shoulder and wrist   SUBJECTIVE:   SUBJECTIVE STATEMENT: Pt states he hasn't done much therapy exercises today yet. He has been doing cane exercises as instructed last time. Fingers feel really swollen today. He does not remember completing the PRWE or 9 hole peg testing.   Pt accompanied by: self  PAIN:  Are you having pain?  No  FALLS: Has patient fallen in last 6 months? Yes. Number of falls 1 - this injury  LIVING ENVIRONMENT: Lives with: Alone. He states driving since stroke. He uses a cane for stability.  OBJECTIVE: (All objective assessments below are from initial evaluation on: 06/28/22 unless otherwise specified.)    HAND DOMINANCE: Right   ADLs: Overall ADLs: He states problems with dressing, bathing, turning door handles, and FMS buttoning with Rt hand now.   FUNCTIONAL OUTCOME MEASURES: 07/24/22: PRWE: Pain: 3/50; Function: 18.5/40; Total Score: 21.5/90 (Higher Score  =  More Pain and/or Debility)   Eval: Patient Rated Wrist Evaluation (PRWE) (modified to exclude work and recreational activities as he states not doing at baseline): Pain: 0/50; Function:  19.5/40; Total Score: 19.5/90 (Higher Score  =  More Pain and/or Debility)    UPPER EXTREMITY ROM     Shoulder to Wrist AROM Right eval Left eval Rt 07/10/22 Rt 07/24/22 Rt  12/12  Forearm supination 63   56 84  Forearm pronation  80   83 84  Wrist flexion 37* (tender) (40* PROM)  50* 47 35 54  Wrist extension 28* (40* PROM)  68* 36 42 44  Wrist ulnar deviation 7*  19 25   Wrist radial deviation 5*  14 15   Functional dart thrower's motion (F-DTM) in ulnar flexion 25*      F-DTM in radial extension  27*      (Blank rows = not tested)   Hand AROM Right eval Right 07/24/22  Full Fist Ability (or Gap to Distal Palmar Crease) Loose fist (fingers barely touch palm Still can touch palm with all fingers with effort  Thumb Opposition to Small Finger (or Gap) 1.5cm gap and slow motion ~1.5 - 2cm gap to oppose  Thumb Opposition to Base of Small Finger (or Gap)  unable unable  (Blank rows = not tested)   UPPER EXTREMITY MMT:    Eval:  NT at eval due to concern for ligament tears, however he was stable and non-painful and this will be tested next session to help "tease out" CVA type symptoms as well. Definite weakness Rt vs Lt.   MMT Right 07/03/22 Rt 07/24/22 Rt 09/25/2022  Elbow flexion 5/5 good tone, poor endurance 4+/5 MMT   Elbow extension 4-/5 lower tone 4+/5 MMT   Forearm supination 4+/5 good tone 4+/5 MMT   Forearm pronation 4+/5 good tone 5/5 MMY   Wrist flexion 4+/5 4+/5 MMT 5/5  Wrist/finger extension 3+/5 4-/5 MMT 4+/5  (Blank rows = not tested)  HAND FUNCTION: 07/24/22: Rt grip today: 25.7#  07/03/22: Rt 19# grip (no pain); Lt: 118#    COORDINATION: 07/24/22: Box & Blocks Test Right 33 Blocks Today (46 is WFL)  9HPT: 9HPT: Rt 7sec   07/03/22: Box & Blocks Test Right 21 Blocks Today (46 is WFL)   Eval: He has observed ataxic motion with finger to nose test, observed slow and poor FMS in Rt hand compared to Lt.  9HPT: Rt 46sec with several pegs dropped.    SENSATION: Eval:  Light touch intact today, he states equal b/l   EDEMA:   07/03/22: Rt hand figure of 8:   48.7cm today  (Lt hand is 48.8cm);  Rt IF base 7.5cm (Lt hand is 7.2cm)   COGNITION: 07/24/22: more overt memory issues since start of care, he can't recall doing box and blocks before, he forgets mentioning lateral shoulder pain, seems to discuss the same things over during each session, etc.   Eval: Overall cognitive status: [facility] for evaluation today, though states some memory issues  OBSERVATIONS:   Eval: no facial droop, but Rt sided ataxia present in UE and LE, poor  tone and coordination noted as well.  Concern for exacerbation of CVA as main issue vs ligament tears and orthopedic issues.  (Rt wrist seems stable, negative Watson's Test, no DRUJ instability, negative TFCC shear test)   TODAY'S TREATMENT:  - Self-care/home management completed for duration as noted below including: Objective measures assessed as noted above to determine progression towards goals. Therapist reviewed goals with patient and updated patient progression.  No additional functional limitations identified. OT reviewed red putty grip. Pt cued to count reps and maintain grasp until fingers stop moving in putty for further progression towards grip strength goal.  - Therapeutic activities completed for duration as noted below including: Wrist jux-a-cisor with use of R with cues to keep elbow to side of body at all times x5 for improved wrist ROM and grip strength of affected extremity  PATIENT EDUCATION: Education details: See tx section above for details  Person educated: Patient; niece Education method: Verbal Instruction, Demonstration Education comprehension: returned demonstration, min v.c  HOME EXERCISE PROGRAM:  Access Code: [email or web address] URL: [email or web address] Exercises: - Seated Shoulder External Rotation PROM on Table  - 3-4 x daily - 3-5 reps - 15-20 sec hold - Sleeper Stretch   - 3-4 x daily - 5 reps - 15-20 sec hold - Seated Single Arm Shoulder PNF D1 Flexion  - 4-6 x daily - 10-15 reps - Wrist Flexion Stretch  - 4 x daily - 3-5 reps - 15 sec hold - Wrist Extension Stretch Pronated  - 4 x daily - 3-5 reps - 15 hold - Bend and Pull Back Wrist SLOWLY  - 4 x daily - 10-15 reps - Tendon Glides  - 4-6 x daily - 3-5 reps - 2-3 seconds hold - Thumb Opposition  - 4-6 x daily - 10 reps - Seated Single Finger Extension  - 4-6 x daily - 10-15 reps - Full Fist  - 2-3 x daily - 5 reps - Finger Extension "Pizza!"   - 2-3 x daily - 5 reps - Finger Pinch and Pull with Putty  - 2-3 x daily - 5 reps - Wrist Extension with Resistance  - 2-4 x daily - 1-2 sets - 10-15 reps - Wrist Flexion with Resistance  - 2-4 x daily - 1-2 sets - 10-15 reps   GOALS: Goals reviewed with patient? Yes   SHORT TERM GOALS: (STG required if POC>30 days) Target Date: 07/13/22  Pt will obtain protective, custom orthotic. Goal status: D/C he doesn't need at this point   2.  Pt will demo/state understanding of initial HEP to improve pain levels and prerequisite motion. Goal status: 07/24/22: MET but he cannot recall most today    LONG TERM GOALS: Target Date: 09/07/22 Pt will improve functional ability by decreased impairment per PRWE functional assessment from 19.5/40 to 10/40 or better, for better quality of life. Goal status: 07/24/22: 18.5 today, slightly better, admits to favoring Lt hand and not using right hand at times.  1/9 - 23/40 though pt self reporting without niece this visit.   2.  Pt will improve grip strength in Rt hand to at least 40lbs for functional use at home and in IADLs. Goal status:  07/24/22: 25# today 12/15 - 25.5 today 1/9 - 24 lbs  3.  Pt will improve A/ROM in Rt wrist flex & ext from 37/28* respectively, to at least 40* both, to have functional motion for tasks like reach and grasp.  Goal status:  MET 07/24/22: now 35* / 42* respectively  12/12 -  54/44*  4.  Pt  will improve strength in Rt wrist flex, ext to at least 4+/5 MMT to have increased functional ability to carry out selfcare and higher-level homecare tasks with no difficulty. Goal status: MET 07/24/22: now 4+/5 flexion, but 4-/5 ext 1/9 - 5/5 flexion, but 4+/5 ext  5.  Pt will improve coordination skills in Rt hand, as seen by better score on 9HPT testing to at least 55sec to have increased functional ability to carry out fine motor tasks (fasteners, etc.) and more complex, coordinated IADLs (meal prep, sports, etc.).  Goal status: 07/24/22: today 67 sec  1/9 - 68 seconds    ASSESSMENT:  CLINICAL IMPRESSION: Pt progression limited by cognition and edema. Progression noted today despite this being a bad edema day and without the presence of his niece. Pt will likely be appropriate for d/c at end of POC as anticipated.   PLAN: OT FREQUENCY: 2x/week   OT DURATION: 24 visits through 10/12/2022   PLANNED INTERVENTIONS: self care/ADL training, therapeutic exercise, therapeutic activity, neuromuscular re-education, manual therapy, passive range of motion, functional mobility training, splinting, electrical stimulation, ultrasound, fluidotherapy, compression bandaging, moist heat, cryotherapy, contrast bath, patient/family education, and coping strategies training  RECOMMENDED OTHER SERVICES: is getting PT for shoulder tears right now. PT was recommended to assess and tx for LE weakness/stroke signs as well. He was also recommended to see cardiologist/neurologist about possible stroke evolution.   CONSULTED AND AGREED WITH PLAN OF CARE: Patient and family member/caregiver  PLAN FOR NEXT SESSION:  Work toward LTG - review red putty HEP; low range functional reach avoiding compensation  Dennis Bast, OT 09/25/2022, 2:25 PM

## 2022-09-27 ENCOUNTER — Encounter: Payer: Self-pay | Admitting: Occupational Therapy

## 2022-09-27 ENCOUNTER — Ambulatory Visit: Payer: Medicare HMO | Admitting: Physical Therapy

## 2022-09-27 ENCOUNTER — Ambulatory Visit: Payer: Medicare HMO | Admitting: Occupational Therapy

## 2022-09-27 VITALS — BP 153/65 | HR 68

## 2022-09-27 DIAGNOSIS — M6281 Muscle weakness (generalized): Secondary | ICD-10-CM

## 2022-09-27 DIAGNOSIS — R2689 Other abnormalities of gait and mobility: Secondary | ICD-10-CM

## 2022-09-27 DIAGNOSIS — R278 Other lack of coordination: Secondary | ICD-10-CM

## 2022-09-27 DIAGNOSIS — M25631 Stiffness of right wrist, not elsewhere classified: Secondary | ICD-10-CM

## 2022-09-27 DIAGNOSIS — R2681 Unsteadiness on feet: Secondary | ICD-10-CM

## 2022-09-27 DIAGNOSIS — G8929 Other chronic pain: Secondary | ICD-10-CM

## 2022-09-27 DIAGNOSIS — I69351 Hemiplegia and hemiparesis following cerebral infarction affecting right dominant side: Secondary | ICD-10-CM

## 2022-09-27 NOTE — Therapy (Signed)
OUTPATIENT OCCUPATIONAL THERAPY PROGRESS NOTE  Patient Name: Joseph Patrick MRN: [id number] DOB:Jan 26, 1946, 77 y.o., male Today's Date: 09/27/2022  PCP: Loura Back, NP REFERRING PROVIDER: Tarry Kos, MD     OT End of Session - 09/27/22 1021     Visit Number 19    Number of Visits 24    Date for OT Re-Evaluation 10/12/21    Authorization Type [employer]    Progress Note Due on Visit 18    OT Start Time 1020    OT Stop Time 1100    OT Time Calculation (min) 40 min    Activity Tolerance Patient tolerated treatment well;Patient limited by fatigue;Patient limited by pain    Behavior During Therapy [facility] for tasks assessed/performed             Past Medical History:  Diagnosis Date   CVA (cerebral vascular accident) [facility])    april 2023   Diabetes mellitus without complication (HCC)    Hypertension    History reviewed. No pertinent surgical history. 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) 01/14/2022   HFrEF (heart failure with reduced ejection fraction) (HCC)    Essential hypertension 01/07/2022   Hyperlipidemia 01/07/2022   Diabetes (HCC) 01/07/2022   Stage 3a chronic kidney disease (CKD) (HCC) 01/07/2022   Transient cerebral ischemia 01/06/2022    ONSET DATE:  July 16th, 2023 (about 12 weeks since injury)   REFERRING DIAG: M25.531 (ICD-10-CM) - Pain in right wrist  THERAPY DIAG:  Muscle weakness (generalized)  Other lack of coordination  Spastic hemiplegia of right dominant side as late effect of cerebral infarction (HCC)  Stiffness of right wrist, not elsewhere classified  Chronic right shoulder pain  Rationale for Evaluation and Treatment Rehabilitation  PERTINENT HISTORY: Per medical notes, he fell ~ 2 months ago, tearing (complete supraspinatus) Rt RTC, tore Rt TFCC as well as S-L ligament.  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 He states falling ~2 months ago and wrist bend into flexion and he tore ligaments in his wrist. He states his right hand is now swollen and stiff. He also states having a stroke this April (about 6 months ago) that affected Rt side, but he states rehabbing back to "normal" before fall. He states no significant pain at fall and none now- just swollen, tight, stiff and also poorer coordination now. He states not wearing any wrist brace after fall, tried a sling for his shoulder, which he felt "made him worse," so d/c'd it.   His niece is with him today to support him."  PRECAUTIONS: Fall and Other: Rt RTC tears   WEIGHT BEARING RESTRICTIONS Yes caution for Rt Shoulder and wrist   SUBJECTIVE:   SUBJECTIVE STATEMENT: The arm is getting better. No pain just stiffness  Pt accompanied by: self  PAIN:  Are you having pain?  No  FALLS: Has patient fallen in last 6 months? Yes. Number of falls 1 - this injury  LIVING ENVIRONMENT: Lives with: Alone. He states driving since stroke. He uses a cane for stability.    OBJECTIVE: (All objective assessments below are from initial evaluation on: 06/28/22 unless otherwise specified.)    HAND DOMINANCE: Right   ADLs: Overall ADLs: He states problems with dressing, bathing, turning door handles, and FMS buttoning with Rt hand now.  FUNCTIONAL OUTCOME MEASURES: 07/24/22: PRWE: Pain: 3/50; Function: 18.5/40; Total Score: 21.5/90 (Higher Score  =  More Pain and/or Debility)   Eval: Patient Rated Wrist Evaluation (PRWE) (modified to exclude work and recreational activities as he states not doing at baseline): Pain: 0/50; Function: 19.5/40; Total Score: 19.5/90 (Higher Score  =  More Pain and/or Debility)    UPPER EXTREMITY ROM     Shoulder to Wrist AROM Right eval Left eval Rt 07/10/22 Rt 07/24/22 Rt  12/12  Forearm supination 63   56 84  Forearm pronation  80   83 84   Wrist flexion 37* (tender) (40* PROM)  50* 47 35 54  Wrist extension 28* (40* PROM)  68* 36 42 44  Wrist ulnar deviation 7*  19 25   Wrist radial deviation 5*  14 15   Functional dart thrower's motion (F-DTM) in ulnar flexion 25*      F-DTM in radial extension  27*      (Blank rows = not tested)   Hand AROM Right eval Right 07/24/22  Full Fist Ability (or Gap to Distal Palmar Crease) Loose fist (fingers barely touch palm Still can touch palm with all fingers with effort  Thumb Opposition to Small Finger (or Gap) 1.5cm gap and slow motion ~1.5 - 2cm gap to oppose  Thumb Opposition to Base of Small Finger (or Gap)  unable unable  (Blank rows = not tested)   UPPER EXTREMITY MMT:    Eval:  NT at eval due to concern for ligament tears, however he was stable and non-painful and this will be tested next session to help "tease out" CVA type symptoms as well. Definite weakness Rt vs Lt.   MMT Right 07/03/22 Rt 07/24/22 Rt 09/25/2022  Elbow flexion 5/5 good tone, poor endurance 4+/5 MMT   Elbow extension 4-/5 lower tone 4+/5 MMT   Forearm supination 4+/5 good tone 4+/5 MMT   Forearm pronation 4+/5 good tone 5/5 MMY   Wrist flexion 4+/5 4+/5 MMT 5/5  Wrist/finger extension 3+/5 4-/5 MMT 4+/5  (Blank rows = not tested)  HAND FUNCTION: 07/24/22: Rt grip today: 25.7#  07/03/22: Rt 19# grip (no pain); Lt: 118#    COORDINATION: 07/24/22: Box & Blocks Test Right 33 Blocks Today (46 is WFL)  9HPT: 9HPT: Rt 42min 7sec   07/03/22: Box & Blocks Test Right 21 Blocks Today (46 is WFL)   Eval: He has observed ataxic motion with finger to nose test, observed slow and poor FMS in Rt hand compared to Lt.  9HPT: Rt 46min 46sec with several pegs dropped.   SENSATION: Eval:  Light touch intact today, he states equal b/l   EDEMA:   07/03/22: Rt hand figure of 8:   48.7cm today  (Lt hand is 48.8cm);  Rt IF base 7.5cm (Lt hand is 7.2cm)   COGNITION: 07/24/22: more overt memory issues since start of  care, he can't recall doing box and blocks before, he forgets mentioning lateral shoulder pain, seems to discuss the same things over during each session, etc.   Eval: Overall cognitive status: [facility] for evaluation today, though states some memory issues  OBSERVATIONS:   Eval: no facial droop, but Rt sided ataxia present in UE and LE, poor tone and coordination noted as well.  Concern for exacerbation of CVA as main issue vs ligament tears and orthopedic issues.  (Rt wrist seems stable, negative Watson's Test, no DRUJ instability, negative TFCC shear test)   TODAY'S TREATMENT:  Pt performed  BUE sh flexion supine with foam roll to increase RUE participation and keep sh and FA rotation neutral. Pt required mod cues for proper technique and elbow extension Practiced low level functional reaching RUE to grasp, move and release cones in flex and scaption ranges w/ cues for proper reaching patterns.  Practiced manipulating mini blocks and checkers in hand and stacking with mod difficulty, min drops  PATIENT EDUCATION: Education details: See tx section above for details  Person educated: Patient; niece Education method: Verbal Instruction, Demonstration Education comprehension: returned demonstration, min v.c  HOME EXERCISE PROGRAM:  Access Code: [email or web address] URL: [email or web address] Exercises: - Seated Shoulder External Rotation PROM on Table  - 3-4 x daily - 3-5 reps - 15-20 sec hold - Sleeper Stretch  - 3-4 x daily - 5 reps - 15-20 sec hold - Seated Single Arm Shoulder PNF D1 Flexion  - 4-6 x daily - 10-15 reps - Wrist Flexion Stretch  - 4 x daily - 3-5 reps - 15 sec hold - Wrist Extension Stretch Pronated  - 4 x daily - 3-5 reps - 15 hold - Bend and Pull Back Wrist SLOWLY  - 4 x daily - 10-15 reps - Tendon Glides  - 4-6 x daily - 3-5 reps - 2-3 seconds hold - Thumb Opposition  - 4-6 x daily - 10 reps - Seated Single Finger Extension  - 4-6 x daily - 10-15 reps - Full Fist  -  2-3 x daily - 5 reps - Finger Extension "Pizza!"   - 2-3 x daily - 5 reps - Finger Pinch and Pull with Putty  - 2-3 x daily - 5 reps - Wrist Extension with Resistance  - 2-4 x daily - 1-2 sets - 10-15 reps - Wrist Flexion with Resistance  - 2-4 x daily - 1-2 sets - 10-15 reps   GOALS: Goals reviewed with patient? Yes   SHORT TERM GOALS: (STG required if POC>30 days) Target Date: 07/13/22  Pt will obtain protective, custom orthotic. Goal status: D/C he doesn't need at this point   2.  Pt will demo/state understanding of initial HEP to improve pain levels and prerequisite motion. Goal status: 07/24/22: MET but he cannot recall most today    LONG TERM GOALS: Target Date: 09/07/22 Pt will improve functional ability by decreased impairment per PRWE functional assessment from 19.5/40 to 10/40 or better, for better quality of life. Goal status: 07/24/22: 18.5 today, slightly better, admits to favoring Lt hand and not using right hand at times.  1/9 - 23/40 though pt self reporting without niece this visit.   2.  Pt will improve grip strength in Rt hand to at least 40lbs for functional use at home and in IADLs. Goal status:  07/24/22: 25# today 12/15 - 25.5 today 1/9 - 24 lbs  3.  Pt will improve A/ROM in Rt wrist flex & ext from 37/28* respectively, to at least 40* both, to have functional motion for tasks like reach and grasp.  Goal status:  MET 07/24/22: now 35* / 42* respectively  12/12 - 54/44*  4.  Pt will improve strength in Rt wrist flex, ext to at least 4+/5 MMT to have increased functional ability to carry out selfcare and higher-level homecare tasks with no difficulty. Goal status: MET 07/24/22: now 4+/5 flexion, but 4-/5 ext 1/9 - 5/5 flexion, but 4+/5 ext  5.  Pt will improve coordination skills in Rt hand, as seen by better score on 9HPT testing to at least 55sec to  have increased functional ability to carry out fine motor tasks (fasteners, etc.) and more complex, coordinated  IADLs (meal prep, sports, etc.).  Goal status: 07/24/22: today 67 sec  1/9 - 68 seconds    ASSESSMENT:  CLINICAL IMPRESSION: Pt progressing with more normal movement patterns in low open chain reaching. Pt continues to need occasional cueing for proper reach.   PLAN: OT FREQUENCY: 2x/week   OT DURATION: 24 visits through 10/12/2022   PLANNED INTERVENTIONS: self care/ADL training, therapeutic exercise, therapeutic activity, neuromuscular re-education, manual therapy, passive range of motion, functional mobility training, splinting, electrical stimulation, ultrasound, fluidotherapy, compression bandaging, moist heat, cryotherapy, contrast bath, patient/family education, and coping strategies training  RECOMMENDED OTHER SERVICES: is getting PT for shoulder tears right now. PT was recommended to assess and tx for LE weakness/stroke signs as well. He was also recommended to see cardiologist/neurologist about possible stroke evolution.   CONSULTED AND AGREED WITH PLAN OF CARE: Patient and family member/caregiver  PLAN FOR NEXT SESSION:  Continue progress towards goals, anticipate d/c end of this month   Hans Eden, OT 09/27/2022, 10:22 AM

## 2022-09-27 NOTE — Therapy (Signed)
OUTPATIENT PHYSICAL THERAPY NEURO TREATMENT   Patient Name: Joseph Patrick MRN: [id number] DOB:October 22, 1945, 77 y.o., male Today's Date: 09/27/2022   PCP: Loura Back NP REFERRING PROVIDER: Micki Riley, MD   PT End of Session - 09/27/22 0947     Visit Number 18    Number of Visits 25   21+4   Date for PT Re-Evaluation 15/40/08   re-cert completed on 09/20/2022   Authorization Type [employer]    Authorization Time Period 08/16/22-09/27/22    Progress Note Due on Visit 20    PT Start Time 0946   Pt arrived late   PT Stop Time 1015    PT Time Calculation (min) 29 min    Equipment Utilized During Treatment Gait belt    Activity Tolerance Patient tolerated treatment well    Behavior During Therapy [facility] for tasks assessed/performed;Impulsive                         Past Medical History:  Diagnosis Date   CVA (cerebral vascular accident) [facility])    april 2023   Diabetes mellitus without complication (HCC)    Hypertension    No past surgical history on file. 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) 01/14/2022   HFrEF (heart failure with reduced ejection fraction) (HCC)    Essential hypertension 01/07/2022   Hyperlipidemia 01/07/2022   Diabetes (HCC) 01/07/2022   Stage 3a chronic kidney disease (CKD) (HCC) 01/07/2022   Transient cerebral ischemia 01/06/2022    ONSET DATE: 07/12/2022  REFERRING DIAG: Q76.195 (ICD-10-CM) - Spastic hemiplegia of right dominant side as late effect of cerebral infarction (HCC) I63.81 (ICD-10-CM) - Lacunar stroke (HCC)  THERAPY DIAG:  Muscle weakness (generalized)  Unsteadiness on feet  Other abnormalities of gait and mobility  Rationale for Evaluation and Treatment: Rehabilitation  SUBJECTIVE:                                                                                                                                                                                              SUBJECTIVE STATEMENT: Pt reports he is okay, is upset that his copay is now $25. "That is a waste of money". Did 80 steps yesterday and 40 steps today. No falls   Pt accompanied by: family member (niece [name])  PERTINENT HISTORY: hypertension, hyperlipidemia, diabetes mellitus, CKD stage III, CHF with ejection fraction of 25 to 30%, medical noncompliance, recent CVA left hemispheric subcortical  PAIN:  Are you having pain?  No  VITALS (LUE in Sitting): Vitals:   09/27/22 0949  BP: (!) 153/65  Pulse: 68     PRECAUTIONS: Fall  WEIGHT BEARING RESTRICTIONS: No  FALLS: Has patient fallen in last 6 months? Yes. Number of falls 1 in August that led to R shoulder injury (tear in rotator cuff muscles)  LIVING ENVIRONMENT: Lives with: lives alone Lives in: House/apartment Stairs: No (can use elevator) Has following equipment at home: Single point cane  PLOF: Independent with gait, Independent with transfers, and Requires assistive device for independence  PATIENT GOALS: "strengthen my right leg and my right arm" "I want to be able to swing a golf club with RUE"  OBJECTIVE:  Ther Ex  DL to 6" target using 06# KB, x10 reps, for proper lift form and posterior chain strength. Noted pt relying heavily on low back to perform movement, keeping his legs straight, despite max cues for proper form. Pt reported he "felt like he was not doing anything"  Progressed to staggered stance deadlifts, x8 per side to 6" box target, but pt unable to perform correctly without compensations. Discussed purpose of movement and where pt should feel movement, but pt perseverated on only feeling it in his back. Noted pt rotating to L side regardless of which leg was placed fwd. Attempted to use mirror for visual biofeedback on body position in front of pt, but pt unable to see himself shifting to LLE.  Reviewed proper sit  <>stand technique using mirror for visual biofeedback for pt to watch body position. Noted pt rotates to L side and uses LUE to stand despite saying he is pushing through RLE. Pt frequently interrupting therapist while therapist trying to explain proper positioning, but niece able to teach back and explain to pt.  Staggered sit <>stands, x10, w/RLE posterior to bias RLE strength. Pt unable to perform without rotating to L side and became defensive, so therapist allowed pt to watch himself in mirror to watch for L weightshift. Pt reported he shifts that way because that is how he gets into bed.  Staggered sit <>stands w/2" step under LLE to bias RLE, x8 reps. Pt reports feeling everything in his lower back and not in his RLE.  Brief discussion regarding importance of body mechanics and body awareness, as pt demonstrates poor body awareness and becomes defensive if cued for correct form. Pt's niece verbalized good understanding and was able to explain to pt w/more success than therapist.   PATIENT EDUCATION: Education details: Continue HEP.   Person educated: Patient and niece [name] Education method: Explanation, Demonstration, and Handouts Education comprehension: verbalized understanding  HOME EXERCISE PROGRAM: Access Code: [email or web address] URL: [email or web address] Date: 07/25/2022 Prepared by: Camille Bal  Exercises - Staggered Sit-to-Stand  - 1 x daily - 4-5 x weekly - 2-3 sets - 10 reps - Standing Tandem Balance with Counter Support  - 1 x daily - 5 x weekly - 1 sets - 2-3 reps - 30-45 seconds hold - Tandem Walking with Counter Support  - 1 x daily - 5-6 x weekly - 3 sets - 10 reps - Mini Squat with Counter Support  - 1 x daily - 7 x weekly - 3 sets - 10 reps - Standing 3-Way Leg Reach with Resistance at Ankles and Counter Support  - 1 x daily - 7 x weekly - 3 sets - 10 reps - Supine Bridge with Mini Swiss Ball Between Knees  - 1 x daily - 7 x weekly - 3 sets - 10  reps -  Forward Step Up with Counter Support  - 1 x daily - 7 x weekly - 3 sets - 10 reps - Supine Heel Slide  - 1 x daily - 7 x weekly - 3 sets - 10 reps - Hooklying Single Leg Bent Knee Fallouts with Resistance  - 1 x daily - 7 x weekly - 3 sets - 10 reps - Seated March with Resistance  - 1 x daily - 7 x weekly - 3 sets - 10 reps - Seated Resisted Hip Adduction - 1 x daily - 7 x weekly - 3 sets - 10 reps   GOALS: Goals reviewed with patient? Yes  SHORT TERM GOALS=LONG-TERM GOALS due to length of POC  NEW LONG TERM GOALS: Target date: 09/20/2022  Pt will be independent with final HEP for improved strength, balance, transfers and gait. Baseline: Established, pt compliant, needs advancement. Goal status: MET  2.  Pt will demonstrate TUG of </=13 seconds in order to decrease risk of falls and improve functional mobility using LRAD. Baseline: 14.35 sec w/ SPC and R AFO, 14.18 sec with SPC and R AFO (11/30); 13.00 sec w/ SPC and R AFO (1/4) Goal status: MET  3.  Pt will improve FGA score to >/=20/30 in order to demonstrate improved balance and decreased fall risk. Baseline: 14/30 no AD, 17/30 with SPC (11/30); 17/30 w/ SPC (1/4) Goal status: NOT MET  4.  Pt will demonstrate a gait speed of >/=2.75 feet/sec using LRAD and AFO in order to decrease risk for falls. Baseline: 2.65 ft/sec w/ SPC and R AFO, 1.91 ft/sec with SPC and AFO (11/30); 2.84 ft/sec w/ SPC and R AFO (1/4) Goal status:  MET  NEW GOALS: Goals reviewed with patient? Yes  SHORT TERM GOALS=LONG-TERM GOALS due to length of POC  NEW LONG TERM GOALS: Target date: 10/19/2022  Pt will be independent with final HEP for improved strength, balance, transfers and gait. Baseline: Will review and advance. Goal status: INITIAL  2.  Pt will improve FGA score to >/=19/30 with use of AD as appropriate in order to demonstrate improved balance and decreased fall risk. Baseline: 17/30 w/ SPC (1/4) Goal status: INITIAL  3.  Pt will demonstrate  a gait speed of >/=3.0 feet/sec using LRAD and AFO in order to decrease risk for falls. Baseline: 2.84 ft/sec w/ SPC and R AFO (1/4) Goal status:  INITIAL  ASSESSMENT:  CLINICAL IMPRESSION: Session limited as pt arrived late. Emphasis of skilled PT session on proper lifting form, body mechanics and RLE strength. Pt slightly defensive throughout session and continues to be limited by poor safety and body awareness. Pt responds best to use of visual cues for body position. Pt continues to rely heavily on LLE during transfers and gait, despite verbalizing he is using his RLE more. Encouraged pt to continue to practice proper sit <>stand technique at home to further strengthen RLE. Pt's niece verbalized understanding. Continue POC.     OBJECTIVE IMPAIRMENTS: Abnormal gait, decreased balance, decreased coordination, decreased endurance, decreased mobility, and decreased strength.   ACTIVITY LIMITATIONS: carrying, lifting, bending, squatting, stairs, and transfers  PARTICIPATION LIMITATIONS: community activity  PERSONAL FACTORS: Age and 1-2 comorbidities:    hypertension, hyperlipidemia, diabetes mellitus, CKD stage III, CHF with ejection fraction of 25 to 30%, medical noncompliance, recent CVA left hemispheric subcortical region are also affecting patient's functional outcome.   REHAB POTENTIAL: Good  CLINICAL DECISION MAKING: Stable/uncomplicated  EVALUATION COMPLEXITY: Low  PLAN:  PT FREQUENCY: 1x/week  PT DURATION: 6 weeks + 12 visits (recert) + 4 visits (at second re-cert)  PLANNED INTERVENTIONS: Therapeutic exercises, Therapeutic activity, Neuromuscular re-education, Balance training, Gait training, Patient/Family education, Self Care, Joint mobilization, Stair training, Vestibular training, Canalith repositioning, Visual/preceptual remediation/compensation, Orthotic/Fit training, DME instructions, Aquatic Therapy, Dry Needling, Electrical stimulation, Cryotherapy, Moist heat, Taping,  Manual therapy, and Re-evaluation  PLAN FOR NEXT SESSION: check L BP!, RLE NMR, Hip flexor and HS strengthening, reaction time, SLS on RLE, SL bridge on compliant surface vs having LLE elevated, cone taps from rocker board, add to HEP: gastroc stretch, hamstring strengthening, golf swing on grass/incline, Review and finalize/advance HEP (may need to condense), exercises like trunk rotation and arc of motion of golf swing, pt wants to work on reaching to ground (compensating w/ body when reaching with RUE) from standing   Bryann Gentz E Delories Mauri, PT, DPT 09/27/2022, 11:33 AM

## 2022-10-02 ENCOUNTER — Ambulatory Visit: Payer: Medicare HMO | Admitting: Physical Therapy

## 2022-10-02 ENCOUNTER — Ambulatory Visit: Payer: Medicare HMO | Admitting: Occupational Therapy

## 2022-10-02 ENCOUNTER — Encounter: Payer: Medicare HMO | Admitting: Occupational Therapy

## 2022-10-02 VITALS — BP 175/81 | HR 75

## 2022-10-02 DIAGNOSIS — I69351 Hemiplegia and hemiparesis following cerebral infarction affecting right dominant side: Secondary | ICD-10-CM

## 2022-10-02 DIAGNOSIS — R2689 Other abnormalities of gait and mobility: Secondary | ICD-10-CM

## 2022-10-02 DIAGNOSIS — R278 Other lack of coordination: Secondary | ICD-10-CM

## 2022-10-02 DIAGNOSIS — M6281 Muscle weakness (generalized): Secondary | ICD-10-CM

## 2022-10-02 DIAGNOSIS — M25631 Stiffness of right wrist, not elsewhere classified: Secondary | ICD-10-CM

## 2022-10-02 DIAGNOSIS — R2681 Unsteadiness on feet: Secondary | ICD-10-CM

## 2022-10-02 NOTE — Therapy (Signed)
OUTPATIENT OCCUPATIONAL THERAPY PROGRESS NOTE  Patient Name: Joseph Patrick MRN: [id number] DOB:[birth year], 77 y.o., male Today's Date: 09/27/2022  PCP: Arthur Holms, NP REFERRING PROVIDER: Leandrew Koyanagi, MD     OT End of Session - 09/27/22 1021     Visit Number 19    Number of Visits 24    Date for OT Re-Evaluation 10/12/21    Authorization Type [employer]    Progress Note Due on Visit 18    OT Start Time 1020    OT Stop Time 1100    OT Time Calculation (min) 40 min    Activity Tolerance Patient tolerated treatment well;Patient limited by fatigue;Patient limited by pain    Behavior During Therapy [facility] for tasks assessed/performed             Past Medical History:  Diagnosis Date   CVA (cerebral vascular accident) [facility])    april 2023   Diabetes mellitus without complication ([place])    Hypertension    History reviewed. No pertinent surgical history. 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 ([place]) 01/14/2022   HFrEF (heart failure with reduced ejection fraction) ([place])    Essential hypertension 01/07/2022   Hyperlipidemia 01/07/2022   Diabetes ([place]) 01/07/2022   Stage 3a chronic kidney disease (CKD) ([place]) 01/07/2022   Transient cerebral ischemia 01/06/2022    ONSET DATE:  July 16th, 2023 (about 12 weeks since injury)   REFERRING DIAG: M25.531 (ICD-10-CM) - Pain in right wrist  THERAPY DIAG:  Muscle weakness (generalized)  Other lack of coordination  Spastic hemiplegia of right dominant side as late effect of cerebral infarction (HCC)  Stiffness of right wrist, not elsewhere classified  Chronic right shoulder pain  Rationale for Evaluation and Treatment Rehabilitation  PERTINENT HISTORY: Per medical notes, he fell ~ 2 months ago, tearing (complete supraspinatus) Rt RTC, tore Rt TFCC as well as S-L ligament.  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 He states falling ~2 months ago and wrist bend into flexion and he tore ligaments in his wrist. He states his right hand is now swollen and stiff. He also states having a stroke this April (about 6 months ago) that affected Rt side, but he states rehabbing back to "normal" before fall. He states no significant pain at fall and none now- just swollen, tight, stiff and also poorer coordination now. He states not wearing any wrist brace after fall, tried a sling for his shoulder, which he felt "made him worse," so d/c'd it.   His niece is with him today to support him."  PRECAUTIONS: Fall and Other: Rt RTC tears   WEIGHT BEARING RESTRICTIONS Yes caution for Rt Shoulder and wrist   SUBJECTIVE:   SUBJECTIVE STATEMENT: Pt reports practicing writing at home  Pt accompanied by: self  PAIN:  Are you having pain?  No  FALLS: Has patient fallen in last 6 months? Yes. Number of falls 1 - this injury  LIVING ENVIRONMENT: Lives with: Alone. He states driving since stroke. He uses a cane for stability.    OBJECTIVE: (All objective assessments below are from initial evaluation on: 06/28/22 unless otherwise specified.)    HAND DOMINANCE: Right   ADLs: Overall ADLs: He states problems with dressing, bathing, turning door handles, and FMS buttoning with Rt hand now.   FUNCTIONAL OUTCOME MEASURES:  07/24/22: PRWE: Pain: 3/50; Function: 18.5/40; Total Score: 21.5/90 (Higher Score  =  More Pain and/or Debility)   Eval: Patient Rated Wrist Evaluation (PRWE) (modified to exclude work and recreational activities as he states not doing at baseline): Pain: 0/50; Function: 19.5/40; Total Score: 19.5/90 (Higher Score  =  More Pain and/or Debility)    UPPER EXTREMITY ROM     Shoulder to Wrist AROM Right eval Left eval Rt 07/10/22 Rt 07/24/22 Rt  12/12  Forearm supination 63   56 84  Forearm pronation  80   83 84  Wrist flexion  37* (tender) (40* PROM)  50* 47 35 54  Wrist extension 28* (40* PROM)  68* 36 42 44  Wrist ulnar deviation 7*  19 25   Wrist radial deviation 5*  14 15   Functional dart thrower's motion (F-DTM) in ulnar flexion 25*      F-DTM in radial extension  27*      (Blank rows = not tested)   Hand AROM Right eval Right 07/24/22  Full Fist Ability (or Gap to Distal Palmar Crease) Loose fist (fingers barely touch palm Still can touch palm with all fingers with effort  Thumb Opposition to Small Finger (or Gap) 1.5cm gap and slow motion ~1.5 - 2cm gap to oppose  Thumb Opposition to Base of Small Finger (or Gap)  unable unable  (Blank rows = not tested)   UPPER EXTREMITY MMT:    Eval:  NT at eval due to concern for ligament tears, however he was stable and non-painful and this will be tested next session to help "tease out" CVA type symptoms as well. Definite weakness Rt vs Lt.   MMT Right 07/03/22 Rt 07/24/22 Rt 09/25/2022  Elbow flexion 5/5 good tone, poor endurance 4+/5 MMT   Elbow extension 4-/5 lower tone 4+/5 MMT   Forearm supination 4+/5 good tone 4+/5 MMT   Forearm pronation 4+/5 good tone 5/5 MMY   Wrist flexion 4+/5 4+/5 MMT 5/5  Wrist/finger extension 3+/5 4-/5 MMT 4+/5  (Blank rows = not tested)  HAND FUNCTION: 07/24/22: Rt grip today: 25.7#  07/03/22: Rt 19# grip (no pain); Lt: 118#    COORDINATION: 07/24/22: Box & Blocks Test Right 33 Blocks Today (46 is WFL)  9HPT: 9HPT: Rt 7sec   07/03/22: Box & Blocks Test Right 21 Blocks Today (46 is WFL)   Eval: He has observed ataxic motion with finger to nose test, observed slow and poor FMS in Rt hand compared to Lt.  9HPT: Rt 46sec with several pegs dropped.   SENSATION: Eval:  Light touch intact today, he states equal b/l   EDEMA:   07/03/22: Rt hand figure of 8:   48.7cm today  (Lt hand is 48.8cm);  Rt IF base 7.5cm (Lt hand is 7.2cm)   COGNITION: 07/24/22: more overt memory issues since start of care, he can't  recall doing box and blocks before, he forgets mentioning lateral shoulder pain, seems to discuss the same things over during each session, etc.   Eval: Overall cognitive status: [facility] for evaluation today, though states some memory issues  OBSERVATIONS:   Eval: no facial droop, but Rt sided ataxia present in UE and LE, poor tone and coordination noted as well.  Concern for exacerbation of CVA as main issue vs ligament tears and orthopedic issues.  (Rt wrist seems stable, negative Watson's Test, no DRUJ instability, negative TFCC shear test)   TODAY'S TREATMENT:  AROM supination/ pronation  for  RUE in sitting Wiping tabletop for low range shoulder flexion  Standing to place large to small pegs into semicircle with RUE for increased fine motor coordination, low range functional reach, min-mod v.c to open hand and for tip pinch Handwriting activities with emphasis on printing large, using foam grip, pt required several rest breaks due to fatigue, min v.c for techniques.  Finger tapping for individual digits, then finger abduction/ adduction, min v.c  Flipping, dealing and flicking cards with RUE for increased coordination, min v.c to avoid compensation PATIENT EDUCATION: Education details: See tx section above for details  Person educated: Patient; niece Education method: Veterinary surgeon, Demonstration Education comprehension: returned demonstration, min v.c  HOME EXERCISE PROGRAM:  Access Code: [email or web address] URL: [email or web address] Exercises: - Seated Shoulder External Rotation PROM on Table  - 3-4 x daily - 3-5 reps - 15-20 sec hold - Sleeper Stretch  - 3-4 x daily - 5 reps - 15-20 sec hold - Seated Single Arm Shoulder PNF D1 Flexion  - 4-6 x daily - 10-15 reps - Wrist Flexion Stretch  - 4 x daily - 3-5 reps - 15 sec hold - Wrist Extension Stretch Pronated  - 4 x daily - 3-5 reps - 15 hold - Bend and Pull Back Wrist SLOWLY  - 4 x daily - 10-15 reps - Tendon Glides  - 4-6  x daily - 3-5 reps - 2-3 seconds hold - Thumb Opposition  - 4-6 x daily - 10 reps - Seated Single Finger Extension  - 4-6 x daily - 10-15 reps - Full Fist  - 2-3 x daily - 5 reps - Finger Extension "Pizza!"   - 2-3 x daily - 5 reps - Finger Pinch and Pull with Putty  - 2-3 x daily - 5 reps - Wrist Extension with Resistance  - 2-4 x daily - 1-2 sets - 10-15 reps - Wrist Flexion with Resistance  - 2-4 x daily - 1-2 sets - 10-15 reps   GOALS: Goals reviewed with patient? Yes   SHORT TERM GOALS: (STG required if POC>30 days) Target Date: 07/13/22  Pt will obtain protective, custom orthotic. Goal status: D/C he doesn't need at this point   2.  Pt will demo/state understanding of initial HEP to improve pain levels and prerequisite motion. Goal status: 07/24/22: MET but he cannot recall most today    LONG TERM GOALS: Target Date: 09/07/22 Pt will improve functional ability by decreased impairment per PRWE functional assessment from 19.5/40 to 10/40 or better, for better quality of life. Goal status: 07/24/22: 18.5 today, slightly better, admits to favoring Lt hand and not using right hand at times.  1/9 - 23/40 though pt self reporting without niece this visit.   2.  Pt will improve grip strength in Rt hand to at least 40lbs for functional use at home and in IADLs. Goal status:  07/24/22: 25# today 12/15 - 25.5 today 1/9 - 24 lbs  3.  Pt will improve A/ROM in Rt wrist flex & ext from 37/28* respectively, to at least 40* both, to have functional motion for tasks like reach and grasp.  Goal status:  MET 07/24/22: now 35* / 42* respectively  12/12 - 54/44*  4.  Pt will improve strength in Rt wrist flex, ext to at least 4+/5 MMT to have increased functional ability to carry out selfcare and higher-level homecare tasks with no difficulty. Goal status: MET 07/24/22: now 4+/5 flexion, but 4-/5 ext 1/9 - 5/5 flexion, but 4+/5 ext  5.  Pt will improve coordination skills in Rt hand, as seen by  better score on 9HPT testing to at least 55sec to have increased functional ability to carry out fine motor tasks (fasteners, etc.) and more complex, coordinated IADLs (meal prep, sports, etc.).  Goal status: 07/24/22: today 67 sec  1/9 - 68 seconds    ASSESSMENT:  CLINICAL IMPRESSION: Pt progressing with more normal movement patterns in low open chain reaching. Pt continues to need occasional cueing for proper reach.   PLAN: OT FREQUENCY: 2x/week   OT DURATION: 24 visits through 10/12/2022   PLANNED INTERVENTIONS: self care/ADL training, therapeutic exercise, therapeutic activity, neuromuscular re-education, manual therapy, passive range of motion, functional mobility training, splinting, electrical stimulation, ultrasound, fluidotherapy, compression bandaging, moist heat, cryotherapy, contrast bath, patient/family education, and coping strategies training  RECOMMENDED OTHER SERVICES: is getting PT for shoulder tears right now. PT was recommended to assess and tx for LE weakness/stroke signs as well. He was also recommended to see cardiologist/neurologist about possible stroke evolution.   CONSULTED AND AGREED WITH PLAN OF CARE: Patient and family member/caregiver  PLAN FOR NEXT SESSION:  Continue progress towards goals, anticipate d/c end of this month   Keene Breath, OTR/L [phone] Phone: [phone] 4:27 PM 10/02/22  09/27/2022, 10:22 AM

## 2022-10-02 NOTE — Therapy (Signed)
OUTPATIENT PHYSICAL THERAPY NEURO TREATMENT   Patient Name: Joseph Patrick MRN: [id number] DOB:[birth year], 77 y.o., male Today's Date: 10/02/2022   PCP: Arthur Holms NP REFERRING PROVIDER: Garvin Fila, MD   PT End of Session - 10/02/22 1444     Visit Number 19    Number of Visits 25   21+4   Date for PT Re-Evaluation 80/99/83   re-cert completed on 11/23/2503   Authorization Type [employer]    Authorization Time Period 08/16/22-09/27/22    Progress Note Due on Visit 20    PT Start Time 3976    PT Stop Time 1530    PT Time Calculation (min) 47 min    Equipment Utilized During Treatment Gait belt    Activity Tolerance Patient tolerated treatment well    Behavior During Therapy [facility] for tasks assessed/performed;Impulsive                          Past Medical History:  Diagnosis Date   CVA (cerebral vascular accident) [facility])    april 2023   Diabetes mellitus without complication ([place])    Hypertension    No past surgical history on file. 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 ([place]) 01/14/2022   HFrEF (heart failure with reduced ejection fraction) ([place])    Essential hypertension 01/07/2022   Hyperlipidemia 01/07/2022   Diabetes ([place]) 01/07/2022   Stage 3a chronic kidney disease (CKD) ([place]) 01/07/2022   Transient cerebral ischemia 01/06/2022    ONSET DATE: 07/12/2022  REFERRING DIAG: B34.193 (ICD-10-CM) - Spastic hemiplegia of right dominant side as late effect of cerebral infarction (HCC) I63.81 (ICD-10-CM) - Lacunar stroke (HCC)  THERAPY DIAG:  Muscle weakness (generalized)  Unsteadiness on feet  Other abnormalities of gait and mobility  Other lack of coordination  Rationale for Evaluation and Treatment: Rehabilitation  SUBJECTIVE:                                                                                                                                                                                              SUBJECTIVE STATEMENT: Pt reports he does a couple of exercises from his HEP during the week but is mostly just walking up/down the stairs. Pt's R foot catches on the floor when ambulating into the clinic this date, pt states he was "distracted". No falls or other acute changes since last session.  Pt accompanied by: family member (niece [name])  PERTINENT HISTORY: hypertension, hyperlipidemia, diabetes mellitus, CKD stage III,  CHF with ejection fraction of 25 to 30%, medical noncompliance, recent CVA left hemispheric subcortical  PAIN:  Are you having pain? No  VITALS (LUE in Sitting): Vitals:   10/02/22 1445 10/02/22 1450  BP: (!) 182/84 (!) 175/81  Pulse: 78 75    PRECAUTIONS: Fall  WEIGHT BEARING RESTRICTIONS: No  FALLS: Has patient fallen in last 6 months? Yes. Number of falls 1 in August that led to R shoulder injury (tear in rotator cuff muscles)  LIVING ENVIRONMENT: Lives with: lives alone Lives in: House/apartment Stairs: No (can use elevator) Has following equipment at home: Single point cane  PLOF: Independent with gait, Independent with transfers, and Requires assistive device for independence  PATIENT GOALS: "strengthen my right leg and my right arm" "I want to be able to swing a golf club with RUE"  OBJECTIVE:  THER EX: Reviewed HEP, pt needing cueing to ensure correct exercise performance for maximum benefit and targeting of appropriate muscles  THER ACT: Pt's BP initially elevated at beginning of session, after seated rest break BP does decrease a bit (see above) although pt is still hypertensive. Pt did take his BP medication this AM, may have eaten foods higher in sodium. Re-educated pt importance of monitoring his BP and keeping it at a safe level for activity.  Foam beam step-overs with RLE, use of SPC and CGA for balance, focus on increasing R hip and  knee flexion during gait, 2 x 15 reps.   PATIENT EDUCATION: Education details: Continue HEP Person educated: Patient and niece Joseph Patrick Education method: Explanation, Demonstration, and Handouts Education comprehension: verbalized understanding  HOME EXERCISE PROGRAM: Access Code: [email or web address] URL: [email or web address] Date: 07/25/2022 Prepared by: Camille Bal  Exercises - Staggered Sit-to-Stand  - 1 x daily - 4-5 x weekly - 2-3 sets - 10 reps - Standing Tandem Balance with Counter Support  - 1 x daily - 5 x weekly - 1 sets - 2-3 reps - 30-45 seconds hold - Tandem Walking with Counter Support  - 1 x daily - 5-6 x weekly - 3 sets - 10 reps - Mini Squat with Counter Support  - 1 x daily - 7 x weekly - 3 sets - 10 reps - Standing 3-Way Leg Reach with Resistance at Ankles and Counter Support  - 1 x daily - 7 x weekly - 3 sets - 10 reps - Supine Bridge with Mini Swiss Ball Between Knees  - 1 x daily - 7 x weekly - 3 sets - 10 reps - Forward Step Up with Counter Support  - 1 x daily - 7 x weekly - 3 sets - 10 reps - Supine Heel Slide  - 1 x daily - 7 x weekly - 3 sets - 10 reps - Hooklying Single Leg Bent Knee Fallouts with Resistance  - 1 x daily - 7 x weekly - 3 sets - 10 reps - Seated March with Resistance  - 1 x daily - 7 x weekly - 3 sets - 10 reps - Seated Resisted Hip Adduction - 1 x daily - 7 x weekly - 3 sets - 10 reps  *bolded exercises not reviewed 1/16, can review next session   GOALS: Goals reviewed with patient? Yes  SHORT TERM GOALS=LONG-TERM GOALS due to length of POC  NEW LONG TERM GOALS: Target date: 09/20/2022  Pt will be independent with final HEP for improved strength, balance, transfers and gait. Baseline: Established, pt compliant, needs advancement. Goal status: MET  2.  Pt will demonstrate TUG  of </=13 seconds in order to decrease risk of falls and improve functional mobility using LRAD. Baseline: 14.35 sec w/ SPC and R AFO, 14.18 sec with  SPC and R AFO (11/30); 13.00 sec w/ SPC and R AFO (1/4) Goal status: MET  3.  Pt will improve FGA score to >/=20/30 in order to demonstrate improved balance and decreased fall risk. Baseline: 14/30 no AD, 17/30 with SPC (11/30); 17/30 w/ SPC (1/4) Goal status: NOT MET  4.  Pt will demonstrate a gait speed of >/=2.75 feet/sec using LRAD and AFO in order to decrease risk for falls. Baseline: 2.65 ft/sec w/ SPC and R AFO, 1.91 ft/sec with SPC and AFO (11/30); 2.84 ft/sec w/ SPC and R AFO (1/4) Goal status:  MET  NEW GOALS: Goals reviewed with patient? Yes  SHORT TERM GOALS=LONG-TERM GOALS due to length of POC  NEW LONG TERM GOALS: Target date: 10/19/2022  Pt will be independent with final HEP for improved strength, balance, transfers and gait. Baseline: Will review and advance. Goal status: INITIAL  2.  Pt will improve FGA score to >/=19/30 with use of AD as appropriate in order to demonstrate improved balance and decreased fall risk. Baseline: 17/30 w/ SPC (1/4) Goal status: INITIAL  3.  Pt will demonstrate a gait speed of >/=3.0 feet/sec using LRAD and AFO in order to decrease risk for falls. Baseline: 2.84 ft/sec w/ SPC and R AFO (1/4) Goal status:  INITIAL  ASSESSMENT:  CLINICAL IMPRESSION: Emphasis of skilled PT session on reviewing HEP to ensure proper body mechanics and correct exercise performance for maximum benefit. Pt does require minor cueing/correction to perform exercises. Transitioned to working on foam beam step-overs with focus on increasing his R hip and knee flexion to improve gait mechanics. Pt exhibits decreased hip and knee flexion during gait when ambulating into therapy clinic this date with increased circumduction of R hip. Pt continues to benefit from skilled therapy services to address R hemibody weakness as well as ongoing decreased safety awareness and insight into his deficits and functional implications. Continue POC.   OBJECTIVE IMPAIRMENTS: Abnormal  gait, decreased balance, decreased coordination, decreased endurance, decreased mobility, and decreased strength.   ACTIVITY LIMITATIONS: carrying, lifting, bending, squatting, stairs, and transfers  PARTICIPATION LIMITATIONS: community activity  PERSONAL FACTORS: Age and 1-2 comorbidities:    hypertension, hyperlipidemia, diabetes mellitus, CKD stage III, CHF with ejection fraction of 25 to 30%, medical noncompliance, recent CVA left hemispheric subcortical region are also affecting patient's functional outcome.   REHAB POTENTIAL: Good  CLINICAL DECISION MAKING: Stable/uncomplicated  EVALUATION COMPLEXITY: Low  PLAN:  PT FREQUENCY: 1x/week  PT DURATION: 6 weeks + 12 visits (recert) + 4 visits (at second re-cert)  PLANNED INTERVENTIONS: Therapeutic exercises, Therapeutic activity, Neuromuscular re-education, Balance training, Gait training, Patient/Family education, Self Care, Joint mobilization, Stair training, Vestibular training, Canalith repositioning, Visual/preceptual remediation/compensation, Orthotic/Fit training, DME instructions, Aquatic Therapy, Dry Needling, Electrical stimulation, Cryotherapy, Moist heat, Taping, Manual therapy, and Re-evaluation  PLAN FOR NEXT SESSION: check L BP!, RLE NMR, Hip flexor and HS strengthening, reaction time, SLS on RLE, SL bridge on compliant surface vs having LLE elevated, cone taps from rocker board, golf swing on grass/incline, Review and finalize/advance HEP (may need to condense), exercises like trunk rotation and arc of motion of golf swing, pt wants to work on reaching to ground (compensating w/ body when reaching with RUE) from standing; review remainder of HEP, work on RLE step-overs, working on reaching to the ground (retrieving objects from various  heights?)   Excell Seltzer, PT, DPT, CSRS 10/02/2022, 3:35 PM

## 2022-10-04 ENCOUNTER — Other Ambulatory Visit: Payer: Self-pay | Admitting: Physician Assistant

## 2022-10-04 ENCOUNTER — Ambulatory Visit: Payer: Medicare HMO | Admitting: Occupational Therapy

## 2022-10-04 ENCOUNTER — Encounter: Payer: Self-pay | Admitting: Occupational Therapy

## 2022-10-04 DIAGNOSIS — R278 Other lack of coordination: Secondary | ICD-10-CM

## 2022-10-04 DIAGNOSIS — M6281 Muscle weakness (generalized): Secondary | ICD-10-CM

## 2022-10-04 DIAGNOSIS — M25631 Stiffness of right wrist, not elsewhere classified: Secondary | ICD-10-CM

## 2022-10-04 NOTE — Therapy (Signed)
OUTPATIENT OCCUPATIONAL THERAPY PROGRESS NOTE  Patient Name: Joseph Patrick MRN: [id number] DOB:Oct 20, 1945, 77 y.o., male Today's Date: 10/04/2022  PCP: Loura Back, NP REFERRING PROVIDER: Tarry Kos, MD     OT End of Session - 10/04/22 1109     Visit Number 21    Number of Visits 24    Date for OT Re-Evaluation 10/12/21    Authorization Type [employer]    Progress Note Due on Visit 18    OT Start Time 1107    OT Stop Time 1145    OT Time Calculation (min) 38 min    Activity Tolerance Patient tolerated treatment well;Patient limited by fatigue    Behavior During Therapy [facility] for tasks assessed/performed              Past Medical History:  Diagnosis Date   CVA (cerebral vascular accident) [facility])    april 2023   Diabetes mellitus without complication (HCC)    Hypertension    History reviewed. No pertinent surgical history. 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) 01/14/2022   HFrEF (heart failure with reduced ejection fraction) (HCC)    Essential hypertension 01/07/2022   Hyperlipidemia 01/07/2022   Diabetes (HCC) 01/07/2022   Stage 3a chronic kidney disease (CKD) (HCC) 01/07/2022   Transient cerebral ischemia 01/06/2022    ONSET DATE:  July 16th, 2023 (about 12 weeks since injury)   REFERRING DIAG: M25.531 (ICD-10-CM) - Pain in right wrist  THERAPY DIAG:  Other lack of coordination  Stiffness of right wrist, not elsewhere classified  Muscle weakness (generalized)  Rationale for Evaluation and Treatment Rehabilitation  PERTINENT HISTORY: Per medical notes, he fell ~ 2 months ago, tearing (complete supraspinatus) Rt RTC, tore Rt TFCC as well as S-L ligament.  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 He states falling ~2 months ago and wrist bend into flexion and he  tore ligaments in his wrist. He states his right hand is now swollen and stiff. He also states having a stroke this April (about 6 months ago) that affected Rt side, but he states rehabbing back to "normal" before fall. He states no significant pain at fall and none now- just swollen, tight, stiff and also poorer coordination now. He states not wearing any wrist brace after fall, tried a sling for his shoulder, which he felt "made him worse," so d/c'd it.   His niece is with him today to support him."  PRECAUTIONS: Fall and Other: Rt RTC tears   WEIGHT BEARING RESTRICTIONS Yes caution for Rt Shoulder and wrist   SUBJECTIVE:   SUBJECTIVE STATEMENT: Pt report   Pt accompanied by: self  PAIN:  Are you having pain?  No  FALLS: Has patient fallen in last 6 months? Yes. Number of falls 1 - this injury  LIVING ENVIRONMENT: Lives with: Alone. He states driving since stroke. He uses a cane for stability.    OBJECTIVE: (All objective assessments below are from initial evaluation on: 06/28/22 unless otherwise specified.)    HAND DOMINANCE: Right   ADLs: Overall ADLs: He states problems with dressing, bathing, turning door handles, and FMS buttoning with Rt hand now.   FUNCTIONAL OUTCOME MEASURES: 07/24/22: PRWE: Pain: 3/50; Function: 18.5/40; Total Score: 21.5/90 (Higher Score  =  More Pain and/or Debility)   Eval: Patient Rated Wrist  Evaluation (PRWE) (modified to exclude work and recreational activities as he states not doing at baseline): Pain: 0/50; Function: 19.5/40; Total Score: 19.5/90 (Higher Score  =  More Pain and/or Debility)    UPPER EXTREMITY ROM     Shoulder to Wrist AROM Right eval Left eval Rt 07/10/22 Rt 07/24/22 Rt  12/12  Forearm supination 63   56 84  Forearm pronation  80   83 84  Wrist flexion 37* (tender) (40* PROM)  50* 47 35 54  Wrist extension 28* (40* PROM)  68* 36 42 44  Wrist ulnar deviation 7*  19 25   Wrist radial deviation 5*  14 15   Functional  dart thrower's motion (F-DTM) in ulnar flexion 25*      F-DTM in radial extension  27*      (Blank rows = not tested)   Hand AROM Right eval Right 07/24/22  Full Fist Ability (or Gap to Distal Palmar Crease) Loose fist (fingers barely touch palm Still can touch palm with all fingers with effort  Thumb Opposition to Small Finger (or Gap) 1.5cm gap and slow motion ~1.5 - 2cm gap to oppose  Thumb Opposition to Base of Small Finger (or Gap)  unable unable  (Blank rows = not tested)   UPPER EXTREMITY MMT:    Eval:  NT at eval due to concern for ligament tears, however he was stable and non-painful and this will be tested next session to help "tease out" CVA type symptoms as well. Definite weakness Rt vs Lt.   MMT Right 07/03/22 Rt 07/24/22 Rt 09/25/2022  Elbow flexion 5/5 good tone, poor endurance 4+/5 MMT   Elbow extension 4-/5 lower tone 4+/5 MMT   Forearm supination 4+/5 good tone 4+/5 MMT   Forearm pronation 4+/5 good tone 5/5 MMY   Wrist flexion 4+/5 4+/5 MMT 5/5  Wrist/finger extension 3+/5 4-/5 MMT 4+/5  (Blank rows = not tested)  HAND FUNCTION: 07/24/22: Rt grip today: 25.7#  07/03/22: Rt 19# grip (no pain); Lt: 118#    COORDINATION: 07/24/22: Box & Blocks Test Right 33 Blocks Today (46 is WFL)  9HPT: 9HPT: Rt 7sec   07/03/22: Box & Blocks Test Right 21 Blocks Today (46 is WFL)   Eval: He has observed ataxic motion with finger to nose test, observed slow and poor FMS in Rt hand compared to Lt.  9HPT: Rt 46sec with several pegs dropped.   SENSATION: Eval:  Light touch intact today, he states equal b/l   EDEMA:   07/03/22: Rt hand figure of 8:   48.7cm today  (Lt hand is 48.8cm);  Rt IF base 7.5cm (Lt hand is 7.2cm)   COGNITION: 07/24/22: more overt memory issues since start of care, he can't recall doing box and blocks before, he forgets mentioning lateral shoulder pain, seems to discuss the same things over during each session, etc.   Eval: Overall cognitive  status: [facility] for evaluation today, though states some memory issues  OBSERVATIONS:   Eval: no facial droop, but Rt sided ataxia present in UE and LE, poor tone and coordination noted as well.  Concern for exacerbation of CVA as main issue vs ligament tears and orthopedic issues.  (Rt wrist seems stable, negative Watson's Test, no DRUJ instability, negative TFCC shear test)   TODAY'S TREATMENT:   Low range functional reach to complete purdue pegboard for incr coordination with mod cueing for compensation Flipping cards with low range functional reach laterally and across body with  min cueing for compensation.   Supination/Pronation AAROM with roller Forearm gym for incr AROM of wrist/forearm. Standing, low-mid range functional reaching to place/remove clothespins with 1-4lb resistance with min cueing for compensation.    PATIENT EDUCATION: N/a today  HOME EXERCISE PROGRAM:  Access Code: [email or web address] URL: [email or web address] Exercises: - Seated Shoulder External Rotation PROM on Table  - 3-4 x daily - 3-5 reps - 15-20 sec hold - Sleeper Stretch  - 3-4 x daily - 5 reps - 15-20 sec hold - Seated Single Arm Shoulder PNF D1 Flexion  - 4-6 x daily - 10-15 reps - Wrist Flexion Stretch  - 4 x daily - 3-5 reps - 15 sec hold - Wrist Extension Stretch Pronated  - 4 x daily - 3-5 reps - 15 hold - Bend and Pull Back Wrist SLOWLY  - 4 x daily - 10-15 reps - Tendon Glides  - 4-6 x daily - 3-5 reps - 2-3 seconds hold - Thumb Opposition  - 4-6 x daily - 10 reps - Seated Single Finger Extension  - 4-6 x daily - 10-15 reps - Full Fist  - 2-3 x daily - 5 reps - Finger Extension "Pizza!"   - 2-3 x daily - 5 reps - Finger Pinch and Pull with Putty  - 2-3 x daily - 5 reps - Wrist Extension with Resistance  - 2-4 x daily - 1-2 sets - 10-15 reps - Wrist Flexion with Resistance  - 2-4 x daily - 1-2 sets - 10-15 reps   GOALS: Goals reviewed with patient? Yes   SHORT TERM GOALS: (STG required  if POC>30 days) Target Date: 07/13/22  Pt will obtain protective, custom orthotic. Goal status: D/C he doesn't need at this point   2.  Pt will demo/state understanding of initial HEP to improve pain levels and prerequisite motion. Goal status: 07/24/22: MET but he cannot recall most today    LONG TERM GOALS: Target Date: 09/07/22 Pt will improve functional ability by decreased impairment per PRWE functional assessment from 19.5/40 to 10/40 or better, for better quality of life. Goal status: 07/24/22: 18.5 today, slightly better, admits to favoring Lt hand and not using right hand at times.  1/9 - 23/40 though pt self reporting without niece this visit.   2.  Pt will improve grip strength in Rt hand to at least 40lbs for functional use at home and in IADLs. Goal status:  07/24/22: 25# today 12/15 - 25.5 today 1/9 - 24 lbs  3.  Pt will improve A/ROM in Rt wrist flex & ext from 37/28* respectively, to at least 40* both, to have functional motion for tasks like reach and grasp.  Goal status:  MET 07/24/22: now 35* / 42* respectively  12/12 - 54/44*  4.  Pt will improve strength in Rt wrist flex, ext to at least 4+/5 MMT to have increased functional ability to carry out selfcare and higher-level homecare tasks with no difficulty. Goal status: MET 07/24/22: now 4+/5 flexion, but 4-/5 ext 1/9 - 5/5 flexion, but 4+/5 ext  5.  Pt will improve coordination skills in Rt hand, as seen by better score on 9HPT testing to at least 55sec to have increased functional ability to carry out fine motor tasks (fasteners, etc.) and more complex, coordinated IADLs (meal prep, sports, etc.).  Goal status: 07/24/22: today 67 sec  1/9 - 68 seconds    ASSESSMENT:  CLINICAL IMPRESSION: Pt progressing with more normal movement patterns in low open chain reaching. Pt continues  to need  cueing for proper reach but reports improved function.   PLAN: OT FREQUENCY: 2x/week   OT DURATION: 24 visits through  10/12/2022   PLANNED INTERVENTIONS: self care/ADL training, therapeutic exercise, therapeutic activity, neuromuscular re-education, manual therapy, passive range of motion, functional mobility training, splinting, electrical stimulation, ultrasound, fluidotherapy, compression bandaging, moist heat, cryotherapy, contrast bath, patient/family education, and coping strategies training  RECOMMENDED OTHER SERVICES: is getting PT for shoulder tears right now. PT was recommended to assess and tx for LE weakness/stroke signs as well. He was also recommended to see cardiologist/neurologist about possible stroke evolution.   CONSULTED AND AGREED WITH PLAN OF CARE: Patient and family member/caregiver  PLAN FOR NEXT SESSION:  Check remaining goals and anticipate d/c   Vianne Bulls, OTR/L [facility] [address]. [place], [place]  [id number] [phone] phone [phone] 10/04/22 11:39 AM

## 2022-10-09 ENCOUNTER — Ambulatory Visit: Payer: Medicare HMO | Admitting: Physical Therapy

## 2022-10-09 ENCOUNTER — Ambulatory Visit: Payer: Medicare HMO | Admitting: Occupational Therapy

## 2022-10-09 VITALS — BP 170/72 | HR 75

## 2022-10-09 DIAGNOSIS — R2681 Unsteadiness on feet: Secondary | ICD-10-CM

## 2022-10-09 DIAGNOSIS — M6281 Muscle weakness (generalized): Secondary | ICD-10-CM

## 2022-10-09 DIAGNOSIS — R278 Other lack of coordination: Secondary | ICD-10-CM

## 2022-10-09 DIAGNOSIS — I69351 Hemiplegia and hemiparesis following cerebral infarction affecting right dominant side: Secondary | ICD-10-CM

## 2022-10-09 DIAGNOSIS — R2689 Other abnormalities of gait and mobility: Secondary | ICD-10-CM

## 2022-10-09 DIAGNOSIS — M25631 Stiffness of right wrist, not elsewhere classified: Secondary | ICD-10-CM

## 2022-10-09 NOTE — Therapy (Signed)
OUTPATIENT PHYSICAL THERAPY NEURO TREATMENT-20TH VISIT PROGRESS NOTE   Patient Name: Joseph Patrick MRN: [id number] DOB:[birth year], 77 y.o., male Today's Date: 10/09/2022   PCP: Arthur Holms NP REFERRING PROVIDER: Garvin Fila, MD  Physical Therapy Progress Note   Dates of Reporting Period: 08/28/22 - 10/09/22  See Note below for Objective Data and Assessment of Progress/Goals.  Thank you for the referral of this patient. Excell Seltzer, PT, DPT, CSRS    PT End of Session - 10/09/22 1018     Visit Number 20    Number of Visits 25   21+4   Date for PT Re-Evaluation 08/65/78   re-cert completed on 12/22/9627   Authorization Type [employer]    Authorization Time Period 08/16/22-09/27/22    Progress Note Due on Visit 20    PT Start Time 1016    PT Stop Time 1100    PT Time Calculation (min) 44 min    Equipment Utilized During Treatment Gait belt    Activity Tolerance Patient tolerated treatment well    Behavior During Therapy WFL for tasks assessed/performed;Impulsive                           Past Medical History:  Diagnosis Date   CVA (cerebral vascular accident) [facility])    april 2023   Diabetes mellitus without complication ([place])    Hypertension    No past surgical history on file. 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 ([place]) 01/14/2022   HFrEF (heart failure with reduced ejection fraction) ([place])    Essential hypertension 01/07/2022   Hyperlipidemia 01/07/2022   Diabetes ([place]) 01/07/2022   Stage 3a chronic kidney disease (CKD) ([place]) 01/07/2022   Transient cerebral ischemia 01/06/2022    ONSET DATE: 07/12/2022  REFERRING DIAG: B28.413 (ICD-10-CM) - Spastic hemiplegia of right dominant side as late effect of cerebral infarction (HCC) I63.81 (ICD-10-CM) - Lacunar stroke (HCC)  THERAPY DIAG:  Other lack of  coordination  Muscle weakness (generalized)  Unsteadiness on feet  Other abnormalities of gait and mobility  Rationale for Evaluation and Treatment: Rehabilitation  SUBJECTIVE:                                                                                                                                                                                             SUBJECTIVE STATEMENT: Pt reports he has been working on walking and going up/down the stairs at home. Pt has been working on his HEP after review  last session.  Pt accompanied by: family member (niece [name])  PERTINENT HISTORY: hypertension, hyperlipidemia, diabetes mellitus, CKD stage III, CHF with ejection fraction of 25 to 30%, medical noncompliance, recent CVA left hemispheric subcortical  PAIN:  Are you having pain? No  VITALS (LUE in Sitting): Vitals:   10/09/22 1023  BP: (!) 170/72  Pulse: 75     PRECAUTIONS: Fall  WEIGHT BEARING RESTRICTIONS: No  FALLS: Has patient fallen in last 6 months? Yes. Number of falls 1 in August that led to R shoulder injury (tear in rotator cuff muscles)  LIVING ENVIRONMENT: Lives with: lives alone Lives in: House/apartment Stairs: No (can use elevator) Has following equipment at home: Single point cane  PLOF: Independent with gait, Independent with transfers, and Requires assistive device for independence  PATIENT GOALS: "strengthen my right leg and my right arm" "I want to be able to swing a golf club with RUE"  OBJECTIVE:  THER EX: Reviewed HEP, pt needing cueing to ensure correct exercise performance for maximum benefit and targeting of appropriate muscles:  Supine therex on mat table: Bridges 2 x 10 reps Resisted SKFO with RTB 2 x 10 reps B  Long sitting: Gastroc stretch with theraband 3 x 30 sec each  Seated EOM: Resisted marches RTB 2 x 10 reps B Resisted hip add RTB 2 x 10 reps RLE  THER ACT: In // bars to work on increasing RLE step height and  clearance: 4" hurdles forwards step-overs with RLE 6 x 10 ft with BUE support 4" hurdles lateral step-overs with alt LE 4 x 10 ft with BUE support Pt initially needs cues to prevent circumduction, improved technique following cueing   PATIENT EDUCATION: Education details: Continue HEP, PT POC with planned d/c next session Person educated: Patient and niece [name] Education method: Explanation, Demonstration, and Handouts Education comprehension: verbalized understanding  HOME EXERCISE PROGRAM: Access Code: [email or web address] URL: [email or web address] Date: 07/25/2022 Prepared by: Elease Etienne  Exercises - Staggered Sit-to-Stand  - 1 x daily - 4-5 x weekly - 2-3 sets - 10 reps - Standing Tandem Balance with Counter Support  - 1 x daily - 5 x weekly - 1 sets - 2-3 reps - 30-45 seconds hold - Tandem Walking with Counter Support  - 1 x daily - 5-6 x weekly - 3 sets - 10 reps - Mini Squat with Counter Support  - 1 x daily - 7 x weekly - 3 sets - 10 reps - Standing 3-Way Leg Reach with Resistance at Ankles and Counter Support  - 1 x daily - 7 x weekly - 3 sets - 10 reps - Supine Bridge with Mini Swiss Ball Between Knees  - 1 x daily - 7 x weekly - 3 sets - 10 reps - Forward Step Up with Counter Support  - 1 x daily - 7 x weekly - 3 sets - 10 reps - Supine Heel Slide  - 1 x daily - 7 x weekly - 3 sets - 10 reps - Hooklying Single Leg Bent Knee Fallouts with Resistance  - 1 x daily - 7 x weekly - 3 sets - 10 reps - Seated March with Resistance  - 1 x daily - 7 x weekly - 3 sets - 10 reps - Seated Resisted Hip Adduction - 1 x daily - 7 x weekly - 3 sets - 10 reps   GOALS: Goals reviewed with patient? Yes  SHORT TERM GOALS=LONG-TERM GOALS due to length of POC  NEW LONG TERM GOALS: Target  date: 09/20/2022  Pt will be independent with final HEP for improved strength, balance, transfers and gait. Baseline: Established, pt compliant, needs advancement. Goal status: MET  2.  Pt  will demonstrate TUG of </=13 seconds in order to decrease risk of falls and improve functional mobility using LRAD. Baseline: 14.35 sec w/ SPC and R AFO, 14.18 sec with SPC and R AFO (11/30); 13.00 sec w/ SPC and R AFO (1/4) Goal status: MET  3.  Pt will improve FGA score to >/=20/30 in order to demonstrate improved balance and decreased fall risk. Baseline: 14/30 no AD, 17/30 with SPC (11/30); 17/30 w/ SPC (1/4) Goal status: NOT MET  4.  Pt will demonstrate a gait speed of >/=2.75 feet/sec using LRAD and AFO in order to decrease risk for falls. Baseline: 2.65 ft/sec w/ SPC and R AFO, 1.91 ft/sec with SPC and AFO (11/30); 2.84 ft/sec w/ SPC and R AFO (1/4) Goal status:  MET  NEW GOALS: Goals reviewed with patient? Yes  SHORT TERM GOALS=LONG-TERM GOALS due to length of POC  NEW LONG TERM GOALS: Target date: 10/19/2022  Pt will be independent with final HEP for improved strength, balance, transfers and gait. Baseline: Will review and advance. Goal status: INITIAL  2.  Pt will improve FGA score to >/=19/30 with use of AD as appropriate in order to demonstrate improved balance and decreased fall risk. Baseline: 17/30 w/ SPC (1/4) Goal status: INITIAL  3.  Pt will demonstrate a gait speed of >/=3.0 feet/sec using LRAD and AFO in order to decrease risk for falls. Baseline: 2.84 ft/sec w/ SPC and R AFO (1/4) Goal status:  INITIAL  ASSESSMENT:  CLINICAL IMPRESSION: Emphasis of skilled PT session on reviewing HEP to ensure proper body mechanics and correct exercise performance for maximum benefit. Pt exhibits good performance of exercises this date. Additionally, worked on increasing RLE step height and clearance with hurdles in // bars, pt exhibits improved ability to perform this task with decreased compensations as compared to previous session. Pt agreeable to plan to d/c from PT services after next visit. Continue POC.   OBJECTIVE IMPAIRMENTS: Abnormal gait, decreased balance,  decreased coordination, decreased endurance, decreased mobility, and decreased strength.   ACTIVITY LIMITATIONS: carrying, lifting, bending, squatting, stairs, and transfers  PARTICIPATION LIMITATIONS: community activity  PERSONAL FACTORS: Age and 1-2 comorbidities:    hypertension, hyperlipidemia, diabetes mellitus, CKD stage III, CHF with ejection fraction of 25 to 30%, medical noncompliance, recent CVA left hemispheric subcortical region are also affecting patient's functional outcome.   REHAB POTENTIAL: Good  CLINICAL DECISION MAKING: Stable/uncomplicated  EVALUATION COMPLEXITY: Low  PLAN:  PT FREQUENCY: 1x/week  PT DURATION: 6 weeks + 12 visits (recert) + 4 visits (at second re-cert)  PLANNED INTERVENTIONS: Therapeutic exercises, Therapeutic activity, Neuromuscular re-education, Balance training, Gait training, Patient/Family education, Self Care, Joint mobilization, Stair training, Vestibular training, Canalith repositioning, Visual/preceptual remediation/compensation, Orthotic/Fit training, DME instructions, Aquatic Therapy, Dry Needling, Electrical stimulation, Cryotherapy, Moist heat, Taping, Manual therapy, and Re-evaluation  PLAN FOR NEXT SESSION: check L BP!, working on reaching to the ground (retrieving objects from various heights?), assess goals and d/c!   Peter Congo, PT, DPT, CSRS 10/09/2022, 11:02 AM

## 2022-10-09 NOTE — Therapy (Signed)
OUTPATIENT OCCUPATIONAL THERAPY PROGRESS NOTE  Patient Name: Joseph Patrick MRN: [id number] DOB:08-Jul-1946, 76 y.o., male Today's Date: 10/09/2022  PCP: Arthur Holms, NP REFERRING PROVIDER: Leandrew Koyanagi, MD   OCCUPATIONAL THERAPY DISCHARGE SUMMARY    Current functional level related to goals / functional outcomes: Pt made good overall progress. He achieved 3/5 long term goals.   Remaining deficits: Decreased strength, decreased coordination, decreased ROM   Education / Equipment: Pt was instructed in HEP. He demonstrates understanding.   Patient agrees to discharge. Patient goals were partially met. Patient is being discharged due to being pleased with the current functional level..     OT End of Session - 10/09/22 1105     Visit Number 22    Number of Visits 24    Date for OT Re-Evaluation 10/12/21    Authorization Type [employer]    OT Start Time 1104    OT Stop Time 8315    OT Time Calculation (min) 41 min              Past Medical History:  Diagnosis Date   CVA (cerebral vascular accident) ([place])    april 2023   Diabetes mellitus without complication ([place])    Hypertension    No past surgical history on file. 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 ([place]) 01/14/2022   HFrEF (heart failure with reduced ejection fraction) ([place])    Essential hypertension 01/07/2022   Hyperlipidemia 01/07/2022   Diabetes ([place]) 01/07/2022   Stage 3a chronic kidney disease (CKD) ([place]) 01/07/2022   Transient cerebral ischemia 01/06/2022    ONSET DATE:  July 16th, 2023 (about 12 weeks since injury)   REFERRING DIAG: M25.531 (ICD-10-CM) - Pain in right wrist  THERAPY DIAG:  Other lack of coordination  Muscle weakness (generalized)  Unsteadiness on feet  Other abnormalities of gait and mobility  Stiffness of right wrist, not  elsewhere classified  Spastic hemiplegia of right dominant side as late effect of cerebral infarction [facility])  Rationale for Evaluation and Treatment Rehabilitation  PERTINENT HISTORY: Per medical notes, he fell ~ 2 months ago, tearing (complete supraspinatus) Rt RTC, tore Rt TFCC as well as S-L ligament.  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 He states falling ~2 months ago and wrist bend into flexion and he tore ligaments in his wrist. He states his right hand is now swollen and stiff. He also states having a stroke this April (about 6 months ago) that affected Rt side, but he states rehabbing back to "normal" before fall. He states no significant pain at fall and none now- just swollen, tight, stiff and also poorer coordination now. He states not wearing any wrist brace after fall, tried a sling for his shoulder, which he felt "made him worse," so d/c'd it.   His niece is with him today to support him."  PRECAUTIONS: Fall and Other: Rt RTC tears   WEIGHT BEARING RESTRICTIONS Yes caution for Rt Shoulder and wrist   SUBJECTIVE:   SUBJECTIVE STATEMENT: Pt  agrees with plans for d/c  Pt accompanied by: self  PAIN:  Are you having pain?  No  FALLS: Has patient fallen in last 6 months? Yes. Number of falls 1 - this injury  LIVING ENVIRONMENT: Lives with: Alone. He states driving since stroke. He uses a cane for  stability.    OBJECTIVE: (All objective assessments below are from initial evaluation on: 06/28/22 unless otherwise specified.)    HAND DOMINANCE: Right   ADLs: Overall ADLs: He states problems with dressing, bathing, turning door handles, and FMS buttoning with Rt hand now.   FUNCTIONAL OUTCOME MEASURES: 07/24/22: PRWE: Pain: 3/50; Function: 18.5/40; Total Score: 21.5/90 (Higher Score  =  More Pain and/or Debility)   Eval: Patient Rated Wrist Evaluation (PRWE) (modified to exclude work and recreational activities as he  states not doing at baseline): Pain: 0/50; Function: 19.5/40; Total Score: 19.5/90 (Higher Score  =  More Pain and/or Debility)    UPPER EXTREMITY ROM     Shoulder to Wrist AROM Right eval Left eval Rt 07/10/22 Rt 07/24/22 Rt  12/12  Forearm supination 63   56 84  Forearm pronation  80   83 84  Wrist flexion 37* (tender) (40* PROM)  50* 47 35 54  Wrist extension 28* (40* PROM)  68* 36 42 44  Wrist ulnar deviation 7*  19 25   Wrist radial deviation 5*  14 15   Functional dart thrower's motion (F-DTM) in ulnar flexion 25*      F-DTM in radial extension  27*      (Blank rows = not tested)   Hand AROM Right eval Right 07/24/22  Full Fist Ability (or Gap to Distal Palmar Crease) Loose fist (fingers barely touch palm Still can touch palm with all fingers with effort  Thumb Opposition to Small Finger (or Gap) 1.5cm gap and slow motion ~1.5 - 2cm gap to oppose  Thumb Opposition to Base of Small Finger (or Gap)  unable unable  (Blank rows = not tested)   UPPER EXTREMITY MMT:    Eval:  NT at eval due to concern for ligament tears, however he was stable and non-painful and this will be tested next session to help "tease out" CVA type symptoms as well. Definite weakness Rt vs Lt.   MMT Right 07/03/22 Rt 07/24/22 Rt 09/25/2022  Elbow flexion 5/5 good tone, poor endurance 4+/5 MMT   Elbow extension 4-/5 lower tone 4+/5 MMT   Forearm supination 4+/5 good tone 4+/5 MMT   Forearm pronation 4+/5 good tone 5/5 MMY   Wrist flexion 4+/5 4+/5 MMT 5/5  Wrist/finger extension 3+/5 4-/5 MMT 4+/5  (Blank rows = not tested)  HAND FUNCTION: 07/24/22: Rt grip today: 25.7#  07/03/22: Rt 19# grip (no pain); Lt: 118#    COORDINATION: 07/24/22: Box & Blocks Test Right 33 Blocks Today (46 is WFL)  9HPT: 9HPT: Rt 27min 7sec   07/03/22: Box & Blocks Test Right 21 Blocks Today (46 is WFL)   Eval: He has observed ataxic motion with finger to nose test, observed slow and poor FMS in Rt hand compared to  Lt.  9HPT: Rt 19min 46sec with several pegs dropped.   SENSATION: Eval:  Light touch intact today, he states equal b/l   EDEMA:   07/03/22: Rt hand figure of 8:   48.7cm today  (Lt hand is 48.8cm);  Rt IF base 7.5cm (Lt hand is 7.2cm)   COGNITION: 07/24/22: more overt memory issues since start of care, he can't recall doing box and blocks before, he forgets mentioning lateral shoulder pain, seems to discuss the same things over during each session, etc.   Eval: Overall cognitive status: [facility] for evaluation today, though states some memory issues  OBSERVATIONS:   Eval: no facial droop, but Rt sided ataxia present in  UE and LE, poor tone and coordination noted as well.  Concern for exacerbation of CVA as main issue vs ligament tears and orthopedic issues.  (Rt wrist seems stable, negative Watson's Test, no DRUJ instability, negative TFCC shear test)   TODAY'S TREATMENT:  Supine closed chain chest press to shoulder flexion, using cane min v.c initially for positioning then pt. returned demonstration 20 reps  Therapist checked progress towards and discussed plans for d/c .    PATIENT EDUCATION: Education details: supine closed chain shoulder flexion and chest press, progress towards goals and plans for d/c Person educated: Patient Education method: Explanation, Demonstration, and Verbal cues Education comprehension: verbalized understanding and returned demonstration   HOME EXERCISE PROGRAM:  Access Code: [email or web address] URL: [email or web address] Exercises: - Seated Shoulder External Rotation PROM on Table  - 3-4 x daily - 3-5 reps - 15-20 sec hold - Sleeper Stretch  - 3-4 x daily - 5 reps - 15-20 sec hold - Seated Single Arm Shoulder PNF D1 Flexion  - 4-6 x daily - 10-15 reps - Wrist Flexion Stretch  - 4 x daily - 3-5 reps - 15 sec hold - Wrist Extension Stretch Pronated  - 4 x daily - 3-5 reps - 15 hold - Bend and Pull Back Wrist SLOWLY  - 4 x daily - 10-15 reps - Tendon  Glides  - 4-6 x daily - 3-5 reps - 2-3 seconds hold - Thumb Opposition  - 4-6 x daily - 10 reps - Seated Single Finger Extension  - 4-6 x daily - 10-15 reps - Full Fist  - 2-3 x daily - 5 reps - Finger Extension "Pizza!"   - 2-3 x daily - 5 reps - Finger Pinch and Pull with Putty  - 2-3 x daily - 5 reps - Wrist Extension with Resistance  - 2-4 x daily - 1-2 sets - 10-15 reps - Wrist Flexion with Resistance  - 2-4 x daily - 1-2 sets - 10-15 reps   GOALS: Goals reviewed with patient? Yes   SHORT TERM GOALS: (STG required if POC>30 days) Target Date: 07/13/22  Pt will obtain protective, custom orthotic. Goal status: D/C he doesn't need at this point   2.  Pt will demo/state understanding of initial HEP to improve pain levels and prerequisite motion. Goal status: 07/24/22: MET but he cannot recall most today    LONG TERM GOALS: Target Date12/22/23 Pt will improve functional ability by decreased impairment per PRWE functional assessment from 19.5/40 to 10/40 or better, for better quality of life. Goal status: 07/24/22: 18.5 today, slightly better, admits to favoring Lt hand and not using right hand at times.  1/9 - 23/40 though pt self reporting without niece this visit.  Goal deferred  2.  Pt will improve grip strength in Rt hand to at least 40lbs for functional use at home and in IADLs. Goal status:  07/24/22: 25# today 12/15 - 25.5 today 1/9 - 24 lb 10/09/22-not met 32.8 #  3.  Pt will improve A/ROM in Rt wrist flex & ext from 37/28* respectively, to at least 40* both, to have functional motion for tasks like reach and grasp.  Goal status:  MET 07/24/22: now 35* / 42* respectively  12/12 - 54/44*  4.  Pt will improve strength in Rt wrist flex, ext to at least 4+/5 MMT to have increased functional ability to carry out selfcare and higher-level homecare tasks with no difficulty. Goal status: MET 07/24/22: now 4+/5 flexion, but 4-/5 ext 1/9 - 5/5  flexion, but 4+/5 ext  5.  Pt will  improve coordination skills in Rt hand, as seen by better score on 9HPT testing to at least 55sec to have increased functional ability to carry out fine motor tasks (fasteners, etc.) and more complex, coordinated IADLs (meal prep, sports, etc.).  Goal status: 07/24/22: today 67 sec  1/9 - 68 seconds 10/09/22-  49 secs met    ASSESSMENT:  CLINICAL IMPRESSION:    Pt demonstrates good overall progress and improved RUE functional use. PLAN: OT FREQUENCY: 2x/week   OT DURATION: 24 visits through 10/12/2022   PLANNED INTERVENTIONS: self care/ADL training, therapeutic exercise, therapeutic activity, neuromuscular re-education, manual therapy, passive range of motion, functional mobility training, splinting, electrical stimulation, ultrasound, fluidotherapy, compression bandaging, moist heat, cryotherapy, contrast bath, patient/family education, and coping strategies training  RECOMMENDED OTHER SERVICES: is getting PT for shoulder tears right now. PT was recommended to assess and tx for LE weakness/stroke signs as well. He was also recommended to see cardiologist/neurologist about possible stroke evolution.   CONSULTED AND AGREED WITH PLAN OF CARE: Patient and family member/caregiver  PLAN FOR NEXT SESSION: D/C OT, pt is in agreement   Keene Breath, OTR/L [phone] Phone: [phone] 11:27 AM 10/09/22  [facility] [address] [place], [place]  [address] [phone] phone [phone] 10/09/22 11:09 AM

## 2022-10-16 ENCOUNTER — Telehealth: Payer: Self-pay | Admitting: Cardiovascular Disease

## 2022-10-16 ENCOUNTER — Ambulatory Visit: Payer: Medicare HMO | Admitting: Physical Therapy

## 2022-10-16 VITALS — BP 165/70 | HR 75

## 2022-10-16 DIAGNOSIS — M6281 Muscle weakness (generalized): Secondary | ICD-10-CM | POA: Diagnosis not present

## 2022-10-16 DIAGNOSIS — R2681 Unsteadiness on feet: Secondary | ICD-10-CM

## 2022-10-16 DIAGNOSIS — R2689 Other abnormalities of gait and mobility: Secondary | ICD-10-CM

## 2022-10-16 MED ORDER — ATORVASTATIN CALCIUM 40 MG PO TABS: 40.0000 mg | ORAL_TABLET | Freq: Every day | ORAL | 2 refills | Status: DC

## 2022-10-16 NOTE — Telephone Encounter (Signed)
Pt c/o BP issue: STAT if pt c/o blurred vision, one-sided weakness or slurred speech  1. What are your last 5 BP readings? Ranging 120-180  2. Are you having any other symptoms (ex. Dizziness, headache, blurred vision, passed out)? No   3. What is your BP issue? BP fluctuating for the past few months

## 2022-10-16 NOTE — Telephone Encounter (Signed)
*  STAT* If patient is at the pharmacy, call can be transferred to refill team.   1. Which medications need to be refilled? (please list name of each medication and dose if known) atorvastatin (LIPITOR) 40 MG tablet   2. Which pharmacy/location (including street and city if local pharmacy) is medication to be sent to? [id number] - [place], [place] [address]   3. Do they need a 30 day or 90 day supply? 90 day supply   Patient is completely out of medication.

## 2022-10-16 NOTE — Telephone Encounter (Signed)
Spoke to the patient niece, patient will like appointment for her uncle due to fluctuating blood pressure. Currently asymptomatic and scheduled for appointment for 2/19.

## 2022-10-16 NOTE — Telephone Encounter (Signed)
Pt's medication was sent to pt's pharmacy as requested. Confirmation received.

## 2022-10-16 NOTE — Therapy (Signed)
OUTPATIENT PHYSICAL THERAPY NEURO TREATMENT-DISCHARGE NOTE   Patient Name: Joseph Patrick MRN: [id number] DOB:Dec 08, 1945, 77 y.o., male Today's Date: 10/16/2022   PCP: Arthur Holms NP REFERRING PROVIDER: Garvin Fila, MD  PHYSICAL THERAPY DISCHARGE SUMMARY  Visits from Start of Care: 21  Current functional level related to goals / functional outcomes: Mod I with SPC and R AFO   Remaining deficits: Ongoing decreased RLE strength, decreased balance   Education / Equipment: Handout for HEP   Patient agrees to discharge. Patient goals were partially met. Patient is being discharged due to maximized rehab potential.      PT End of Session - 10/16/22 1024     Visit Number 21    Number of Visits 25   21+4   Date for PT Re-Evaluation 95/18/84   re-cert completed on 09/22/6061   Authorization Type [employer]    Authorization Time Period 08/16/22-09/27/22    Progress Note Due on Visit 20    PT Start Time 1022   this therapist ran over with previous patient   PT Stop Time 1100    PT Time Calculation (min) 38 min    Equipment Utilized During Treatment Gait belt    Activity Tolerance Patient tolerated treatment well    Behavior During Therapy WFL for tasks assessed/performed;Impulsive                            Past Medical History:  Diagnosis Date   CVA (cerebral vascular accident) [facility])    april 2023   Diabetes mellitus without complication ([place])    Hypertension    No past surgical history on file. 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 ([place]) 01/14/2022   HFrEF (heart failure with reduced ejection fraction) ([place])    Essential hypertension 01/07/2022   Hyperlipidemia 01/07/2022   Diabetes ([place]) 01/07/2022   Stage 3a chronic kidney disease (CKD) ([place]) 01/07/2022   Transient cerebral ischemia 01/06/2022    ONSET DATE:  07/12/2022  REFERRING DIAG: K16.010 (ICD-10-CM) - Spastic hemiplegia of right dominant side as late effect of cerebral infarction (HCC) I63.81 (ICD-10-CM) - Lacunar stroke (HCC)  THERAPY DIAG:  Muscle weakness (generalized)  Unsteadiness on feet  Other abnormalities of gait and mobility  Rationale for Evaluation and Treatment: Rehabilitation  SUBJECTIVE:  SUBJECTIVE STATEMENT: Pt reports no acute changes since last session, no pain today. Pt has noticed more stiffness in his R index and middle finger. Pt reports he didn't exercise the past few days because he didn't feel like it. Pt getting more sensation in his R side.  Pt accompanied by: family member (niece [name])  PERTINENT HISTORY: hypertension, hyperlipidemia, diabetes mellitus, CKD stage III, CHF with ejection fraction of 25 to 30%, medical noncompliance, recent CVA left hemispheric subcortical  PAIN:  Are you having pain? No  VITALS (LUE in Sitting): Vitals:   10/16/22 1032  BP: (!) 165/70  Pulse: 75     PRECAUTIONS: Fall  WEIGHT BEARING RESTRICTIONS: No  FALLS: Has patient fallen in last 6 months? Yes. Number of falls 1 in August that led to R shoulder injury (tear in rotator cuff muscles)  LIVING ENVIRONMENT: Lives with: lives alone Lives in: House/apartment Stairs: No (can use elevator) Has following equipment at home: Single point cane  PLOF: Independent with gait, Independent with transfers, and Requires assistive device for independence  PATIENT GOALS: "strengthen my right leg and my right arm" "I want to be able to swing a golf club with RUE"  OBJECTIVE:   THER ACT:  [facility] PT Assessment - 10/16/22 1041       Ambulation/Gait   Gait velocity 32.8 ft over 12.62 sec = 2.6 ft/sec      Functional Gait  Assessment    Gait assessed  Yes    Gait Level Surface Walks 20 ft in less than 7 sec but greater than 5.5 sec, uses assistive device, slower speed, mild gait deviations, or deviates 6-10 in outside of the 12 in walkway width.    Change in Gait Speed Able to change speed, demonstrates mild gait deviations, deviates 6-10 in outside of the 12 in walkway width, or no gait deviations, unable to achieve a major change in velocity, or uses a change in velocity, or uses an assistive device.    Gait with Horizontal Head Turns Performs head turns smoothly with no change in gait. Deviates no more than 6 in outside 12 in walkway width    Gait with Vertical Head Turns Performs head turns with no change in gait. Deviates no more than 6 in outside 12 in walkway width.    Gait and Pivot Turn Pivot turns safely in greater than 3 sec and stops with no loss of balance, or pivot turns safely within 3 sec and stops with mild imbalance, requires small steps to catch balance.    Step Over Obstacle Is able to step over one shoe box (4.5 in total height) without changing gait speed. No evidence of imbalance.    Gait with Narrow Base of Support Is able to ambulate for 10 steps heel to toe with no staggering.    Gait with Eyes Closed Walks 20 ft, uses assistive device, slower speed, mild gait deviations, deviates 6-10 in outside 12 in walkway width. Ambulates 20 ft in less than 9 sec but greater than 7 sec.    Ambulating Backwards Walks 20 ft, uses assistive device, slower speed, mild gait deviations, deviates 6-10 in outside 12 in walkway width.    Steps Alternating feet, must use rail.    Total Score 23    FGA comment: 23/30, medium fall risk            Educated pt on BP management and BP ranges. Encouraged pt to check his BP at home at least 1x/day and to  keep a log so he can be aware of trends.   PATIENT EDUCATION: Education details: Continue HEP, d/c from PT Person educated: Patient and niece [name] Education method:  Explanation, Demonstration, and Handouts Education comprehension: verbalized understanding  HOME EXERCISE PROGRAM: Access Code: [email or web address] URL: [email or web address] Date: 07/25/2022 Prepared by: Elease Etienne  Exercises - Staggered Sit-to-Stand  - 1 x daily - 4-5 x weekly - 2-3 sets - 10 reps - Standing Tandem Balance with Counter Support  - 1 x daily - 5 x weekly - 1 sets - 2-3 reps - 30-45 seconds hold - Tandem Walking with Counter Support  - 1 x daily - 5-6 x weekly - 3 sets - 10 reps - Mini Squat with Counter Support  - 1 x daily - 7 x weekly - 3 sets - 10 reps - Standing 3-Way Leg Reach with Resistance at Ankles and Counter Support  - 1 x daily - 7 x weekly - 3 sets - 10 reps - Supine Bridge with Mini Swiss Ball Between Knees  - 1 x daily - 7 x weekly - 3 sets - 10 reps - Forward Step Up with Counter Support  - 1 x daily - 7 x weekly - 3 sets - 10 reps - Supine Heel Slide  - 1 x daily - 7 x weekly - 3 sets - 10 reps - Hooklying Single Leg Bent Knee Fallouts with Resistance  - 1 x daily - 7 x weekly - 3 sets - 10 reps - Seated March with Resistance  - 1 x daily - 7 x weekly - 3 sets - 10 reps - Seated Resisted Hip Adduction - 1 x daily - 7 x weekly - 3 sets - 10 reps   GOALS: Goals reviewed with patient? Yes  SHORT TERM GOALS=LONG-TERM GOALS due to length of POC  NEW LONG TERM GOALS: Target date: 09/20/2022  Pt will be independent with final HEP for improved strength, balance, transfers and gait. Baseline: Established, pt compliant, needs advancement. Goal status: MET  2.  Pt will demonstrate TUG of </=13 seconds in order to decrease risk of falls and improve functional mobility using LRAD. Baseline: 14.35 sec w/ SPC and R AFO, 14.18 sec with SPC and R AFO (11/30); 13.00 sec w/ SPC and R AFO (1/4) Goal status: MET  3.  Pt will improve FGA score to >/=20/30 in order to demonstrate improved balance and decreased fall risk. Baseline: 14/30 no AD, 17/30 with SPC  (11/30); 17/30 w/ SPC (1/4) Goal status: NOT MET  4.  Pt will demonstrate a gait speed of >/=2.75 feet/sec using LRAD and AFO in order to decrease risk for falls. Baseline: 2.65 ft/sec w/ SPC and R AFO, 1.91 ft/sec with SPC and AFO (11/30); 2.84 ft/sec w/ SPC and R AFO (1/4) Goal status:  MET  NEW GOALS: Goals reviewed with patient? Yes  SHORT TERM GOALS=LONG-TERM GOALS due to length of POC  NEW LONG TERM GOALS: Target date: 10/19/2022  Pt will be independent with final HEP for improved strength, balance, transfers and gait. Baseline: Will review and advance. Goal status: MET  2.  Pt will improve FGA score to >/=19/30 with use of AD as appropriate in order to demonstrate improved balance and decreased fall risk. Baseline: 17/30 w/ SPC (1/4), 23/30 (1/30) Goal status: MET  3.  Pt will demonstrate a gait speed of >/=3.0 feet/sec using LRAD and AFO in order to decrease risk for falls. Baseline: 2.84 ft/sec w/ SPC and R AFO (1/4),  2.6 ft/sec with SPC and R AFO (1/30) Goal status:  NOT MET  ASSESSMENT:  CLINICAL IMPRESSION: Emphasis of skilled PT session on reassessing LTG in preparation for d/c from PT services this date as well as reassessing BP and reeducating pt on importance of BP monitoring at home to prevent another stroke. Pt has met 2/3 LTG due to being independent with his HEP and improving his FGA score from 17/30 initially to 23/30 this date. Pt has improved his gait speed from his initial speed of 1.91 ft/sec to 2.6 ft/sec this date but did not meet his goal of 3.0 ft/sec. Pt does continue to exhibit some impulsivity and decreased insight into his impairments and functional implications but is safe to d/c from OPPT services this date and continue with his HEP. Pt to continue to monitor his BP at home as well and follow up with his PCP and/or cardiologist if he notices a trend of BP elevation.  OBJECTIVE IMPAIRMENTS: Abnormal gait, decreased balance, decreased coordination,  decreased endurance, decreased mobility, and decreased strength.   ACTIVITY LIMITATIONS: carrying, lifting, bending, squatting, stairs, and transfers  PARTICIPATION LIMITATIONS: community activity  PERSONAL FACTORS: Age and 1-2 comorbidities:    hypertension, hyperlipidemia, diabetes mellitus, CKD stage III, CHF with ejection fraction of 25 to 30%, medical noncompliance, recent CVA left hemispheric subcortical region are also affecting patient's functional outcome.   REHAB POTENTIAL: Good  CLINICAL DECISION MAKING: Stable/uncomplicated  EVALUATION COMPLEXITY: Low    Excell Seltzer, PT, DPT, CSRS 10/16/2022, 11:01 AM

## 2022-11-05 ENCOUNTER — Ambulatory Visit: Payer: Medicare HMO | Admitting: Student

## 2022-11-06 NOTE — Progress Notes (Signed)
Office Visit    Patient Name: Joseph Patrick Date of Encounter: 11/07/2022  PCP:  Arthur Holms, NP   [facility]  Cardiologist:  Jenkins Rouge, MD  Advanced Practice Provider:  No care team member to display Electrophysiologist:  None   HPI    Joseph Patrick is a 77 y.o. male with a PMH of HFrEF (echocardiogram 12/2021 with LVEF 25 to 30%), mitral regurgitation, history of CVA April 2023, hypertension, hyperlipidemia, diabetes mellitus, chronic kidney disease presents today for follow-up appointment.  Renal artery ultrasound with no RAS cardiac PET was ordered by Richardson Dopp, PA-C but never done since June.  He had return for follow-up in October.  He continued to do well at that time without any chest pain, shortness of breath, syncope, orthopnea, and leg edema.  He was still having right-sided weakness.  This was improving but was concerned about his hand and the weakness from the stroke.  Getting regular PT/OT.  He was hoping to get enough strength and coordination to get back to golf.  Discussed the need to reschedule PET/CT to rule out CAD given low EF.  Today, he states that he feels good.  He is still doing physical therapy exercises for his stroke.  No cardiovascular symptoms at this time.  He thinks his Lipitor was increased by his primary and his LDL was 127.  We have encouraged him to get a repeat lipid panel in a few weeks to recheck.  Goal is under 100.  We reviewed his most recent PET CT scan which did have an incidental finding of a few pulmonary nodules measuring 5 mm.  He would like to get a repeat CT scan through our office (due November 2024).  We also discussed getting a follow-up echocardiogram.  His blood pressure is slightly elevated today but the patient tells me that is actually very good for him.  His blood pressure was running [id number] to [id number] systolic before these recent medication changes.  His blood pressure medications include carvedilol 25 mg twice a  day, Imdur 120 mg daily, hydralazine 100 mg 3 times daily, and Entresto 97-103 mg twice a day as well as spironolactone 25 mg daily.  Euvolemic on exam today.  He does endorse some cramping of his legs (on his stroke side) at night occasionally which resolves quickly.  We discussed checking his electrolytes today.  Reports no shortness of breath nor dyspnea on exertion. Reports no chest pain, pressure, or tightness. No edema, orthopnea, PND. Reports no palpitations.    Past Medical History    Past Medical History:  Diagnosis Date   CVA (cerebral vascular accident) [facility])    april 2023   Diabetes mellitus without complication ([place])    Hypertension    History reviewed. No pertinent surgical history.  Allergies  Allergies  Allergen Reactions   Amlodipine Itching     EKGs/Labs/Other Studies Reviewed:   The following studies were reviewed today:  NM PET CT 07/31/22   Findings are consistent with no prior ischemia and no prior myocardial infarction. The study is intermediate risk due to moderately reduced LVEF at rest and mildly reduced myocardial blood flow reserve.   LV perfusion is normal.   Rest left ventricular function is abnormal. Rest global function is mildly-to-moderately reduced. There were no regional wall motion abnormalities. Rest EF: 39 %. Stress left ventricular function is normal. Stress EF: 56 %. End diastolic cavity size is normal. End systolic cavity size is normal.   Myocardial  blood flow was computed to be 1.37m/g/min at rest and 1.68mg/min at stress. Global myocardial blood flow reserve was 1.58 and was mildly abnormal. Findings most likely due to underlying microvascular dysfunction in the setting of CKD as perfusion is normal, there is no coronary calcification, and there is significant augmentation of LVEF with stress.   Coronary calcium was absent on the attenuation correction CT images.  Renal artery ultrasound 03/07/2022 No evidence of renal artery  stenosis on the right; 1-59% stenosis in the left renal artery Normal celiac artery and SMA   Echocardiogram 01/07/2022 Coarse apical trabeculation, no obvious thrombus; EF 25-30, global HK, mild LVH, GRII DD, normal RVSF, moderate LAE, moderate MR  EKG:  EKG is not ordered today.    Recent Labs: 01/15/2022: TSH 0.854 02/01/2022: Magnesium 2.3 02/14/2022: ALT 38 07/20/2022: BUN 30; Creatinine, Ser 1.32; Hemoglobin 11.3; Platelets 198; Potassium 4.1; Sodium 139  Recent Lipid Panel    Component Value Date/Time   CHOL 186 01/07/2022 0551   CHOL 194 03/28/2020 1552   TRIG 82 01/07/2022 0551   HDL 43 01/07/2022 0551   HDL 40 03/28/2020 1552   CHOLHDL 4.3 01/07/2022 0551   VLDL 16 01/07/2022 0551   LDLCALC 127 (H) 01/07/2022 0551   LDLCALC 120 (H) 03/28/2020 1552     Home Medications   Current Meds  Medication Sig   aspirin EC 81 MG tablet Take 1 tablet (81 mg total) by mouth daily. Swallow whole.   atorvastatin (LIPITOR) 40 MG tablet Take 1 tablet (40 mg total) by mouth daily.   carvedilol (COREG) 25 MG tablet Take 1 tablet (25 mg total) by mouth 2 (two) times daily.   Cholecalciferol (VITAMIN D3 PO) Take 1 capsule by mouth daily.   hydrALAZINE (APRESOLINE) 100 MG tablet TAKE 1 TABLET BY MOUTH 3 TIMES DAILY.   isosorbide mononitrate (IMDUR) 120 MG 24 hr tablet TAKE 1 TABLET BY MOUTH EVERY DAY   sacubitril-valsartan (ENTRESTO) 97-103 MG Take 1 tablet by mouth 2 (two) times daily.   spironolactone (ALDACTONE) 50 MG tablet Take 0.5 tablets (25 mg total) by mouth daily.     Review of Systems      All other systems reviewed and are otherwise negative except as noted above.  Physical Exam    VS:  BP (!) 140/72   Pulse 66   Ht 5' 9"$  (1.753 m)   Wt 179 lb (81.2 kg)   SpO2 97%   BMI 26.43 kg/m  , BMI Body mass index is 26.43 kg/m.  Wt Readings from Last 3 Encounters:  11/07/22 179 lb (81.2 kg)  07/12/22 174 lb 12.8 oz (79.3 kg)  06/15/22 170 lb 12.8 oz (77.5 kg)     GEN:  Well nourished, well developed, in no acute distress. HEENT: normal. Neck: Supple, no JVD, carotid bruits, or masses. Cardiac: RRR, no murmurs, rubs, or gallops. No clubbing, cyanosis, edema.  Radials/PT 2+ and equal bilaterally.  Respiratory:  Respirations regular and unlabored, clear to auscultation bilaterally. GI: Soft, nontender, nondistended. MS: No deformity or atrophy. Skin: Warm and dry, no rash. Neuro:  Strength and sensation are intact. Psych: Normal affect.  Assessment & Plan    Hypertension -Continue current blood pressure regimen which includes carvedilol 25 mg twice a day, hydralazine 100 mg 3 times a day, Entresto 97-103 mg twice a day, Imdur 120 mg daily and Aldactone 25 mg daily -Please monitor blood pressure closely at home and if consistently above [id number] let usKoreanow -low sodium diet  discussed  Hyperlipidemia -LDL above goal at 127, triglycerides 82 -Recent increase in Lipitor to 40 mg daily by PCP, recommend follow-up lipid panel in 2 months  Leg cramps -Will order BMP and mag to check electrolytes -This is occurring on his right leg and right arm (stroke side) -He also discussed this with physical therapy  HFrEF/dilated CM -Will plan to repeat echocardiogram in about a month -Euvolemic on exam today -Continue Entresto, Aldactone, Coreg  Pulmonary nodules found on PET CT -Will schedule a chest CT November right before his f/u with Dr. 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  Signed, Elgie Collard, PA-C 11/07/2022, 12:46 PM [facility]

## 2022-11-07 ENCOUNTER — Ambulatory Visit: Payer: Medicare HMO | Attending: Student | Admitting: Physician Assistant

## 2022-11-07 ENCOUNTER — Encounter: Payer: Self-pay | Admitting: Physician Assistant

## 2022-11-07 VITALS — BP 140/72 | HR 66 | Ht 69.0 in | Wt 179.0 lb

## 2022-11-07 DIAGNOSIS — R252 Cramp and spasm: Secondary | ICD-10-CM

## 2022-11-07 DIAGNOSIS — I5020 Unspecified systolic (congestive) heart failure: Secondary | ICD-10-CM | POA: Diagnosis not present

## 2022-11-07 DIAGNOSIS — I10 Essential (primary) hypertension: Secondary | ICD-10-CM

## 2022-11-07 DIAGNOSIS — R918 Other nonspecific abnormal finding of lung field: Secondary | ICD-10-CM

## 2022-11-07 DIAGNOSIS — I420 Dilated cardiomyopathy: Secondary | ICD-10-CM | POA: Diagnosis not present

## 2022-11-07 DIAGNOSIS — N1831 Chronic kidney disease, stage 3a: Secondary | ICD-10-CM | POA: Diagnosis not present

## 2022-11-07 DIAGNOSIS — I69359 Hemiplegia and hemiparesis following cerebral infarction affecting unspecified side: Secondary | ICD-10-CM

## 2022-11-07 DIAGNOSIS — R911 Solitary pulmonary nodule: Secondary | ICD-10-CM

## 2022-11-07 NOTE — Patient Instructions (Addendum)
Medication Instructions:  Your physician recommends that you continue on your current medications as directed. Please refer to the Current Medication list given to you today.  *If you need a refill on your cardiac medications before your next appointment, please call your pharmacy*   Lab Work: BMP and Mag today Fasting lipids and lft's with your primary care provider If you have labs (blood work) drawn today and your tests are completely normal, you will receive your results only by: [place] (if you have MyChart) OR A paper copy in the mail If you have any lab test that is abnormal or we need to change your treatment, we will call you to review the results.   Testing/Procedures: Your provider has recommended that you have a non-contrast chest CT in November. Non-Cardiac CT scanning, (CAT scanning), is a noninvasive, special x-ray that produces cross-sectional images of the body using x-rays and a computer. CT scans help physicians diagnose and treat medical conditions. For some CT exams, a contrast material is used to enhance visibility in the area of the body being studied. CT scans provide greater clarity and reveal more details than regular x-ray exams.   Your physician has requested that you have an echocardiogram in 1 month. Echocardiography is a painless test that uses sound waves to create images of your heart. It provides your doctor with information about the size and shape of your heart and how well your heart's chambers and valves are working. This procedure takes approximately one hour. There are no restrictions for this procedure. Please do NOT wear cologne, perfume, aftershave, or lotions (deodorant is allowed). Please arrive 15 minutes prior to your appointment time.    Follow-Up: At [facility], you and your health needs are our priority.  As part of our continuing mission to provide you with exceptional heart care, we have created designated Provider Care  Teams.  These Care Teams include your primary Cardiologist (physician) and Advanced Practice Providers (APPs -  Physician Assistants and Nurse Practitioners) who all work together to provide you with the care you need, when you need it.  Your next appointment:   November 2024  Provider:   Jenkins Rouge, MD

## 2022-11-07 NOTE — Progress Notes (Unsigned)
[facility] [address] [place]. [place] [address] [phone]       OFFICE FOLLOW-UP NOTE  [name] Date of Birth:  [birth year] Medical Record Number:  [id number]    Primary neurologist: Dr. Leonie Man Reason for visit: Stroke follow-up   Chief Complaint  Patient presents with   Follow-up    Pt here with niece, rm 8 overall stable and states doing well. He completed PT/OT.      HPI:  Update 11/08/2022 JM: Patient returns for 78-monthstroke follow-up accompanied by his niece.  Reports residual right-sided weakness which has been about the same since last visit. Occasional cramping in foot and arm but will quickly resolve. Right shoulder pain greatly improved since prior visit. Does use AFO brace and cane for ambulation, denies any recent falls.  Completed PT/OT last month as he met max rehab potential with PT and was pleased with current level of functioning with OT. Does admit to not routinely doing HEP but does try to keep active, he is looking forward to the weather getting warmer so he can do more things outside and try to return to playing golf.  Denies new stroke/TIA symptoms.  Compliant on aspirin and atorvastatin Blood pressure today elevated at 165/75, was 140/72 at visit yesterday with cardiologist therefore maintained on current regimen, monitors at home and has been about 140-150s.  Routinely follows with PCP and cardiology     Initial visit 05/08/2022 Dr. SLeonie Man [name] a 77year old African-American male seen today for initial office follow-up visit for lacunar stroke.  He is accompanied by his niece.  History is obtained from them and review of electronic medical records and I personally reviewed pertinent available imaging films in PACS.  He has past medical history of hypertension diabetes.  His medications 3 months prior to imaging 10/08/2021 and blood pressure.  He noticed right foot numbness and imbalance with walking presented to the  emergency room with blood pressure of 247110.  MRI scan of the brain revealed punctate infarct basal ganglia lacunar infarct.  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  Echocardiogram showed ejection fraction of 25 to 30%.  Hemoglobin A1c cholesterol is 127.  Started on dual antiplatelet therapy and discharged home but returned on 01/14/2022 with subjective worsening of his right-sided deficits.  Repeat MRI scan showed evolution of the left basal ganglia infarct.  He had more severe deficits.  He was transferred to inpatient rehab for discharge home and initially was doing well with significant improvement in his right-sided strength was able to ambulate in the foot brace and regain good strength.  He unfortunately was seen in the ER on multiple occasions for uncontrolled hypertension.  He had a fall few weeks ago and was seen in the ER.  Fortunately he had no fractures on the x-ray.  He was shoulder pain and movement.  He is noted some progression on the right side.  He is also noted some swelling in the right to be following up with the cardiology clinic for his.  States his sugars have been better.  He is tolerating Lipitor well without muscle aches and pains.  He is on Coreg, hydralazine and Aldactone for blood pressure but if is yet not optimal but better than before and today it is 157/79.    ROS:   14 system review of systems is positive for those listed in HPI and all other systems negative  PMH:  Past Medical History:  Diagnosis Date   CVA (cerebral vascular accident) [facility])    april 2023   Diabetes mellitus without complication ([place])    Hypertension     Social History:  Social History   Socioeconomic History   Marital status: Single    Spouse name: Not on file   Number of children: Not on file   Years of education: Not on file   Highest education level: Not on file  Occupational History   Not on file  Tobacco Use   Smoking  status: Never   Smokeless tobacco: Never  Vaping Use   Vaping Use: Never used  Substance and Sexual Activity   Alcohol use: Never   Drug use: Never   Sexual activity: Not on file  Other Topics Concern   Not on file  Social History Narrative   Not on file   Social Determinants of Health   Financial Resource Strain: Not on file  Food Insecurity: Not on file  Transportation Needs: Not on file  Physical Activity: Not on file  Stress: Not on file  Social Connections: Not on file  Intimate Partner Violence: Not on file    Medications:   Current Outpatient Medications on File Prior to Visit  Medication Sig Dispense Refill   aspirin EC 81 MG tablet Take 1 tablet (81 mg total) by mouth daily. Swallow whole. 90 tablet 3   atorvastatin (LIPITOR) 40 MG tablet Take 1 tablet (40 mg total) by mouth daily. 90 tablet 2   carvedilol (COREG) 25 MG tablet Take 1 tablet (25 mg total) by mouth 2 (two) times daily. 180 tablet 3   Cholecalciferol (VITAMIN D3 PO) Take 1 capsule by mouth daily.     hydrALAZINE (APRESOLINE) 100 MG tablet TAKE 1 TABLET BY MOUTH 3 TIMES DAILY. 270 tablet 3   isosorbide mononitrate (IMDUR) 120 MG 24 hr tablet TAKE 1 TABLET BY MOUTH EVERY DAY 90 tablet 3   sacubitril-valsartan (ENTRESTO) 97-103 MG Take 1 tablet by mouth 2 (two) times daily. 180 tablet 3   spironolactone (ALDACTONE) 50 MG tablet Take 0.5 tablets (25 mg total) by mouth daily. 45 tablet 2   No current facility-administered medications on file prior to visit.    Allergies:   Allergies  Allergen Reactions   Amlodipine Itching    Physical Exam Today's Vitals   11/08/22 0806  BP: (!) 165/75  Pulse: 67  Weight: 178 lb (80.7 kg)  Height: 5' 9"$  (1.753 m)   Body mass index is 26.29 kg/m.  General: well developed, well nourished very pleasant elderly African-American male, seated, in no evident distress Head: head normocephalic and atraumatic.  Neck: supple with no carotid or supraclavicular  bruits Cardiovascular: regular rate and rhythm, no murmurs Musculoskeletal: Decreased right shoulder ROM Skin:  no rash/petichiae Vascular:  Normal pulses all extremities  Neurologic Exam Mental Status: Awake and fully alert. Oriented to place and time. Recent and remote memory intact. Attention span, concentration and fund of knowledge appropriate. Mood and affect appropriate.  Cranial Nerves: Pupils equal, briskly reactive to light. Extraocular movements full without nystagmus. Visual fields full to confrontation. Hearing intact. Facial sensation intact.  Mild right lower facial asymmetry, tongue, palate moves normally and symmetrically.  Motor: Mild right hemiparesis 4+/5.  Weakness of right grip and intrinsic hand muscles.  Orbits left or right upper extremity.  Fine finger movements are diminished on the right.  Mild right hip flexor and ankle dorsiflexor weakness, AFO in place.  Tone is increased  on the right. Normal strength on the left. Sensory.: intact to touch ,pinprick .position and vibratory sensation.  Coordination: Rapid alternating movements normal on left side. Finger-to-nose and heel-to-shin performed accurately on left side. Gait and Station: Arises from chair without difficulty. Stance is normal. Gait demonstrates hemiplegic gait with use of cane and AFO brace.  Tandem walk and heel toe not attempted Reflexes: 1+ and symmetric. Toes downgoing.      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  Vascular risk factors diabetes, hypertension, hyperlipidemia and intracranial atherosclerosis.    -Recommend doing HEP as advised by therapies, continue to stay active and maintaining independence as long as safety permits.  Continued use of cane for fall prevention -Continue aspirin 81 mg daily and atorvastatin 40 mg daily for secondary stroke prevention  -Closely  monitor blood pressure at home and if remains elevated, advised to contact cardiology for further recommendations -Continue close PCP/cardiology follow-up for aggressive stroke risk factor management including BP goal<130/90, and HLD with LDL goal<70   Overall stable from stroke standpoint without further recommendations and recommend follow-up on an as-needed basis    I spent 31 minutes of face-to-face and non-face-to-face time with patient and niece.  This included previsit chart review, lab review, study review, order entry, electronic health record documentation, patient and niece education and discussion regarding the above and answered all other questions to patient and nieces satisfaction  Frann Rider, [facility]  [facility] [address] [place], [place] [address]  Phone [phone] Fax [phone] Note: This document was prepared with digital dictation and possible smart phrase technology. Any transcriptional errors that result from this process are unintentional.

## 2022-11-08 ENCOUNTER — Ambulatory Visit: Payer: Medicare HMO | Admitting: Adult Health

## 2022-11-08 ENCOUNTER — Encounter: Payer: Self-pay | Admitting: Adult Health

## 2022-11-08 VITALS — BP 165/75 | HR 67 | Ht 69.0 in | Wt 178.0 lb

## 2022-11-08 DIAGNOSIS — I69351 Hemiplegia and hemiparesis following cerebral infarction affecting right dominant side: Secondary | ICD-10-CM

## 2022-11-08 DIAGNOSIS — I6381 Other cerebral infarction due to occlusion or stenosis of small artery: Secondary | ICD-10-CM | POA: Diagnosis not present

## 2022-11-08 LAB — BASIC METABOLIC PANEL
BUN/Creatinine Ratio: 19 (ref 10–24)
BUN: 28 mg/dL — ABNORMAL HIGH (ref 8–27)
CO2: 23 mmol/L (ref 20–29)
Calcium: 9.8 mg/dL (ref 8.6–10.2)
Chloride: 99 mmol/L (ref 96–106)
Creatinine, Ser: 1.45 mg/dL — ABNORMAL HIGH (ref 0.76–1.27)
Glucose: 196 mg/dL — ABNORMAL HIGH (ref 70–99)
Potassium: 4.5 mmol/L (ref 3.5–5.2)
Sodium: 136 mmol/L (ref 134–144)
eGFR: 50 mL/min/{1.73_m2} — ABNORMAL LOW (ref >59)

## 2022-11-08 LAB — MAGNESIUM: Magnesium: 1.9 mg/dL (ref 1.6–2.3)

## 2022-11-08 NOTE — Patient Instructions (Addendum)
Continue to do exercises at home as recommended by therapies  Continue aspirin 81 mg daily  and atorvastatin for secondary stroke prevention  Closely monitor blood pressure at home and follow-up with cardiology if remains elevated  Continue to follow up with PCP regarding blood pressure and cholesterol management  Maintain strict control of hypertension with blood pressure goal below 130/90 and cholesterol with LDL cholesterol (bad cholesterol) goal below 70 mg/dL.   Signs of a Stroke? Follow the BEFAST method:  Balance Watch for a sudden loss of balance, trouble with coordination or vertigo Eyes Is there a sudden loss of vision in one or both eyes? Or double vision?  Face: Ask the person to smile. Does one side of the face droop or is it numb?  Arms: Ask the person to raise both arms. Does one arm drift downward? Is there weakness or numbness of a leg? Speech: Ask the person to repeat a simple phrase. Does the speech sound slurred/strange? Is the person confused ? Time: If you observe any of these signs, call 911.     Overall stable from stroke standpoint without further recommendations, follow up with [place] as needed at this time      Thank you for coming to see [place] at [facility]. I hope we have been able to provide you high quality care today.  You may receive a patient satisfaction survey over the next few weeks. We would appreciate your feedback and comments so that we may continue to improve ourselves and the health of our patients.

## 2022-12-05 ENCOUNTER — Ambulatory Visit (HOSPITAL_COMMUNITY): Payer: Medicare HMO | Attending: Physician Assistant

## 2023-01-01 ENCOUNTER — Ambulatory Visit (HOSPITAL_COMMUNITY): Payer: Medicare HMO | Attending: Internal Medicine

## 2023-01-01 DIAGNOSIS — I5020 Unspecified systolic (congestive) heart failure: Secondary | ICD-10-CM | POA: Insufficient documentation

## 2023-01-01 DIAGNOSIS — I420 Dilated cardiomyopathy: Secondary | ICD-10-CM | POA: Diagnosis present

## 2023-01-01 LAB — ECHOCARDIOGRAM COMPLETE
Area-P 1/2: 3.74 cm2
S' Lateral: 2.9 cm

## 2023-01-01 MED ORDER — PERFLUTREN LIPID MICROSPHERE
1.0000 mL | INTRAVENOUS | Status: AC | PRN
  Administered 2023-01-01: 2 mL via INTRAVENOUS

## 2023-02-26 ENCOUNTER — Other Ambulatory Visit: Payer: Self-pay | Admitting: Physician Assistant

## 2023-04-24 ENCOUNTER — Other Ambulatory Visit: Payer: Self-pay | Admitting: Physician Assistant

## 2023-04-28 ENCOUNTER — Other Ambulatory Visit: Payer: Self-pay | Admitting: Cardiovascular Disease

## 2023-04-30 ENCOUNTER — Other Ambulatory Visit: Payer: Self-pay

## 2023-04-30 MED ORDER — ENTRESTO 97-103 MG PO TABS: 1.0000 | ORAL_TABLET | Freq: Two times a day (BID) | ORAL | 1 refills | Status: DC

## 2023-04-30 NOTE — Telephone Encounter (Signed)
Pt's medication was sent to pt's pharmacy as requested. Confirmation received.  °

## 2023-06-27 ENCOUNTER — Other Ambulatory Visit: Payer: Self-pay | Admitting: Cardiovascular Disease

## 2023-07-12 ENCOUNTER — Other Ambulatory Visit: Payer: Self-pay

## 2023-07-16 ENCOUNTER — Other Ambulatory Visit: Payer: Self-pay | Admitting: Cardiovascular Disease

## 2023-07-24 ENCOUNTER — Other Ambulatory Visit: Payer: Medicare HMO

## 2023-07-30 ENCOUNTER — Other Ambulatory Visit: Payer: Self-pay | Admitting: Cardiovascular Disease

## 2023-08-29 ENCOUNTER — Other Ambulatory Visit: Payer: Self-pay | Admitting: Cardiovascular Disease

## 2023-09-24 ENCOUNTER — Telehealth: Payer: Self-pay | Admitting: Cardiovascular Disease

## 2023-09-24 NOTE — Telephone Encounter (Signed)
 Pt c/o medication issue:  1. Name of Medication:   sacubitril -valsartan  (ENTRESTO ) 97-103 MG   2. How are you currently taking this medication (dosage and times per day)?   As prescribed  3. Are you having a reaction (difficulty breathing--STAT)?   No  4. What is your medication issue?   Niece Stoney) stated patient cannot afford this medication and wants to get alternate medication or next steps.

## 2023-09-24 NOTE — Telephone Encounter (Signed)
 Called patient's niece, DPR, gave her patient assistance number for Entresto. Will also send to pharmacy tech.

## 2023-09-25 ENCOUNTER — Telehealth: Payer: Self-pay

## 2023-09-25 ENCOUNTER — Other Ambulatory Visit (HOSPITAL_COMMUNITY): Payer: Self-pay

## 2023-09-25 DIAGNOSIS — I5020 Unspecified systolic (congestive) heart failure: Secondary | ICD-10-CM

## 2023-09-25 NOTE — Telephone Encounter (Signed)
 Patient qualifies for [employer] for copay assistance. Patient enrolled in healthwell. Grant info and billing instructions have been faxed to [facility]. Pt made aware via mychart of grant approval.

## 2023-09-25 NOTE — Telephone Encounter (Signed)
 Patient Advocate Encounter   The patient was approved for a [employer] grant that will help cover the cost of ENTRESTO  Total amount awarded, $10,000.  Effective: 08/26/23 - 08/24/24   APW:[id number] ERW:[id number] Hmnle:[id number] [id number]   Pharmacy provided with approval and processing information. Patient informed via RHONA Ileana Lehmann, CPhT  Pharmacy Patient Advocate Specialist  Direct Number: [phone] Fax: [phone]

## 2023-10-01 NOTE — Telephone Encounter (Signed)
 Joseph Patrick, pt niece called in about this message. She wanted to make sure that the message went through because she was told Dorathy Daft was out until the 17th however pt will be out of medication before then

## 2023-10-02 ENCOUNTER — Other Ambulatory Visit (HOSPITAL_COMMUNITY): Payer: Self-pay

## 2023-10-02 MED ORDER — ENTRESTO 97-103 MG PO TABS
1.0000 | ORAL_TABLET | Freq: Two times a day (BID) | ORAL | 1 refills | Status: DC
  Filled 2023-10-02: qty 180, 90d supply, fill #0
  Filled 2023-12-24: qty 180, 90d supply, fill #1

## 2023-10-02 NOTE — Addendum Note (Signed)
 Addended by: Sunny English on: 10/02/2023 10:54 AM   Modules accepted: Orders

## 2023-10-02 NOTE — Telephone Encounter (Signed)
Rx sent to [facility]. 

## 2023-10-03 ENCOUNTER — Other Ambulatory Visit (HOSPITAL_COMMUNITY): Payer: Self-pay

## 2023-10-24 NOTE — Progress Notes (Signed)
Cardiology Office Note:    Date:  11/01/2023   ID:  Joseph Patrick, DOB 12-15-1945, MRN [id number]  PCP:  Jackie Plum, MD   HeartCare Providers Cardiologist:  Charlton Haws, MD    Referring MD: Loura Back, NP   Patient Profile: (HFrEF) heart failure with reduced ejection fraction  Echocardiogram 4/23: EF 25-30, coarse trabeculation apex - no clot Mitral regurgitation  Hx of CVA in April 2023  Admit with L frontoparietal CVA Readmit with stuttering lacunar CVA Hypertension  Difficult to control  Admx w stroke in 12/2021>>BP 260/120s Renal artery [place] 02/2022: L1-59, R no stenosis Hyperlipidemia  Diabetes mellitus  Chronic kidney disease  Prior CV Studies: Renal artery ultrasound 03-28-2022 No evidence of renal artery stenosis on the right; 1-59% stenosis in the left renal artery Normal celiac artery and SMA  Echocardiogram 01/07/2022 Coarse apical trabeculation, no obvious thrombus; EF 25-30, global HK, mild LVH, GRII DD, normal RVSF, moderate LAE, moderate MR  Echocardiogram 01/01/23  IMPRESSIONS     1. Left ventricular ejection fraction, by estimation, is 55 to 60%. The  left ventricle has normal function. The left ventricle has no regional  wall motion abnormalities. There is moderate left ventricular hypertrophy.  Left ventricular diastolic  parameters are consistent with Grade I diastolic dysfunction (impaired  relaxation).   2. Right ventricular systolic function is normal. The right ventricular  size is normal. Tricuspid regurgitation signal is inadequate for assessing  PA pressure.   3. The mitral valve is normal in structure. Trivial mitral valve  regurgitation. No evidence of mitral stenosis.   4. The aortic valve is tricuspid. Aortic valve regurgitation is not  visualized. No aortic stenosis is present.   5. The inferior vena cava is normal in size with greater than 50%  respiratory variability, suggesting right atrial pressure of 3 mmHg.    History of Present Illness:   Joseph Patrick is a 78 y.o. male with the above problem list.  BP labile.  Renal arterial ultrasound no RAS Cardiac PET was ordered by Tereso Newcomer but never done since June  He returns for follow-up.   He is here with his wife.  He continues to do well without chest pain, shortness of breath, syncope, orthopnea, leg edema.  He still has right-sided weakness.  This is improving but he is concerned about his hand weakness from stroke  Getting regular PT/OT still  Hoping to get enough strength and coordination back to golf Has a whole set [employer] [place] clubs he bought just before stroke  Updated TTE 01/01/23 EF normalized PET/CT done 01/01/23 no calcium normal EF stress 56% and reduced MBFR 1.58   Discussed need for better BS control with A1c 7.7     Past Medical History:  Diagnosis Date   CVA (cerebral vascular accident) [facility])    april 2023   Diabetes mellitus without complication (HCC)    Hypertension    Current Medications: Current Meds  Medication Sig   aspirin EC 81 MG tablet Take 1 tablet (81 mg total) by mouth daily. Swallow whole.   atorvastatin (LIPITOR) 40 MG tablet TAKE 1 TABLET BY MOUTH EVERY DAY   carvedilol (COREG) 25 MG tablet TAKE 1 TABLET BY MOUTH TWICE A DAY   Cholecalciferol (VITAMIN D3 PO) Take 1 capsule by mouth daily.   GLIPIZIDE PO Take by mouth daily.   hydrALAZINE (APRESOLINE) 100 MG tablet TAKE 1 TABLET BY MOUTH THREE TIMES A DAY   isosorbide mononitrate (IMDUR) 120 MG 24 hr tablet  TAKE 1 TABLET BY MOUTH EVERY DAY   sacubitril-valsartan (ENTRESTO) 97-103 MG Take 1 tablet by mouth 2 (two) times daily.   spironolactone (ALDACTONE) 50 MG tablet TAKE 1/2 TABLET BY MOUTH DAILY    Allergies:   Amlodipine   Social History   Tobacco Use   Smoking status: Never   Smokeless tobacco: Never  Vaping Use   Vaping status: Never Used  Substance Use Topics   Alcohol use: Never   Drug use: Never    Family Hx: The patient's family history  includes Heart failure in his sister.  Review of Systems  Gastrointestinal:  Negative for hematochezia.  Genitourinary:  Negative for hematuria.     EKGs/Labs/Other Test Reviewed:    EKG:  SR rate 66 T wave inversions 3,F   Recent Labs: 11/07/2022: BUN 28; Creatinine, Ser 1.45; Magnesium 1.9; Potassium 4.5; Sodium 136   Recent Lipid Panel No results for input(s): "CHOL", "TRIG", "HDL", "VLDL", "LDLCALC", "LDLDIRECT" in the last 8760 hours.     Physical Exam:    VS:  BP (!) 154/68   Pulse 74   Ht 5\' 9"  (1.753 m)   Wt 183 lb 6.4 oz (83.2 kg)   SpO2 99%   BMI 27.08 kg/m     Wt Readings from Last 3 Encounters:  11/01/23 183 lb 6.4 oz (83.2 kg)  11/08/22 178 lb (80.7 kg)  11/07/22 179 lb (81.2 kg)    Affect appropriate Chronically ill male  HEENT: normal Neck supple with no adenopathy JVP normal no bruits no thyromegaly Lungs clear with no wheezing and good diaphragmatic motion Heart:  S1/S2 no murmur, no rub, gallop or click PMI normal Abdomen: benighn, BS positve, no tenderness, no AAA no bruit.  No HSM or HJR Distal pulses intact with no bruits No edema Neuro right arm/hand weakness RLE weakness in brace  Skin warm and dry Right partial hemiparesis worse in hand Walks with cane        ASSESSMENT & PLAN:    Essential hypertension  Labile, ? 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  He is still bothered by his hand weakness.  I have reminded him to continue his home exercises and discuss his weakness with neurology at his  follow-up visit He thinks he had a 2 nd stroke but never got re-imaged Told him to discuss with Dr Pearlean Brownie Using AFO brace and cane for ambulation   DM:  A1c 7.7 05/10/22 F/U primary  On higher dose glipized Told him to ask primary about starting Jardiance HIs diet is actually pretty good and low carb  Pulmonary Nodule: Seen on PET/CT 07/31/22  5 mm f/u non smoker no f/u recommended by radiology   F/U in a  year   Signed, Charlton Haws, MD  11/01/2023 9:39 AM    [facility] [address] [place], [place], [place]  [address] Phone: [phone]; Fax: [phone]

## 2023-10-27 ENCOUNTER — Other Ambulatory Visit: Payer: Self-pay | Admitting: Physician Assistant

## 2023-11-01 ENCOUNTER — Encounter: Payer: Self-pay | Admitting: Cardiovascular Disease

## 2023-11-01 ENCOUNTER — Ambulatory Visit: Payer: Medicare HMO | Attending: Cardiovascular Disease | Admitting: Cardiovascular Disease

## 2023-11-01 VITALS — BP 154/68 | HR 74 | Ht 69.0 in | Wt 183.4 lb

## 2023-11-01 DIAGNOSIS — I5020 Unspecified systolic (congestive) heart failure: Secondary | ICD-10-CM

## 2023-11-01 DIAGNOSIS — I10 Essential (primary) hypertension: Secondary | ICD-10-CM

## 2023-11-01 DIAGNOSIS — I69359 Hemiplegia and hemiparesis following cerebral infarction affecting unspecified side: Secondary | ICD-10-CM | POA: Diagnosis not present

## 2023-11-01 NOTE — Patient Instructions (Addendum)
Medication Instructions:  Your physician recommends that you continue on your current medications as directed. Please refer to the Current Medication list given to you today.  *If you need a refill on your cardiac medications before your next appointment, please call your pharmacy*  Lab Work: If you have labs (blood work) drawn today and your tests are completely normal, you will receive your results only by: MyChart Message (if you have MyChart) OR A paper copy in the mail If you have any lab test that is abnormal or we need to change your treatment, we will call you to review the results.  Testing/Procedures: None ordered today.  Follow-Up: At [facility], you and your health needs are our priority.  As part of our continuing mission to provide you with exceptional heart care, we have created designated Provider Care Teams.  These Care Teams include your primary Cardiologist (physician) and Advanced Practice Providers (APPs -  Physician Assistants and Nurse Practitioners) who all work together to provide you with the care you need, when you need it.  We recommend signing up for the patient portal called "MyChart".  Sign up information is provided on this After Visit Summary.  MyChart is used to connect with patients for Virtual Visits (Telemedicine).  Patients are able to view lab/test results, encounter notes, upcoming appointments, etc.  Non-urgent messages can be sent to your provider as well.   To learn more about what you can do with MyChart, go to [email or web address].    Your next appointment:   12 month(s)  Provider:   Charlton Haws, MD     Other Instructions    1st Floor: - Lobby - Registration  - Pharmacy  - Lab - Cafe  2nd Floor: - PV Lab - Diagnostic Testing (echo, CT, nuclear med)  3rd Floor: - Vacant  4th Floor: - TCTS (cardiothoracic surgery) - AFib Clinic - Structural Heart Clinic - Vascular Surgery  - Vascular Ultrasound  5th Floor: -  HeartCare Cardiology (general and EP) - Clinical Pharmacy for coumadin, hypertension, lipid, weight-loss medications, and med management appointments    Valet parking services will be available as well.

## 2024-01-17 ENCOUNTER — Other Ambulatory Visit (HOSPITAL_COMMUNITY): Payer: Self-pay

## 2024-02-26 ENCOUNTER — Encounter: Payer: Self-pay | Admitting: Cardiovascular Disease

## 2024-02-26 MED ORDER — ISOSORBIDE MONONITRATE ER 120 MG PO TB24: 120.0000 mg | ORAL_TABLET | Freq: Every day | ORAL | 3 refills | Status: AC

## 2024-03-25 ENCOUNTER — Other Ambulatory Visit: Payer: Self-pay

## 2024-03-25 MED ORDER — HYDRALAZINE HCL 100 MG PO TABS: 100.0000 mg | ORAL_TABLET | Freq: Three times a day (TID) | ORAL | 2 refills | Status: AC

## 2024-03-31 ENCOUNTER — Other Ambulatory Visit: Payer: Self-pay | Admitting: Cardiovascular Disease

## 2024-03-31 DIAGNOSIS — I5020 Unspecified systolic (congestive) heart failure: Secondary | ICD-10-CM

## 2024-04-02 ENCOUNTER — Other Ambulatory Visit (HOSPITAL_COMMUNITY): Payer: Self-pay

## 2024-04-02 ENCOUNTER — Telehealth: Payer: Self-pay | Admitting: Cardiovascular Disease

## 2024-04-02 DIAGNOSIS — I5020 Unspecified systolic (congestive) heart failure: Secondary | ICD-10-CM

## 2024-04-02 MED ORDER — SACUBITRIL-VALSARTAN 97-103 MG PO TABS
1.0000 | ORAL_TABLET | Freq: Two times a day (BID) | ORAL | 2 refills | Status: AC
  Filled 2024-04-02: qty 180, 90d supply, fill #0
  Filled 2024-06-23 – 2024-06-25 (×2): qty 180, 90d supply, fill #1
  Filled 2024-09-15: qty 180, 90d supply, fill #2

## 2024-04-02 NOTE — Telephone Encounter (Signed)
 Pt's niece's calling to check the status of medication. He takes his last pill tonight.

## 2024-04-02 NOTE — Telephone Encounter (Signed)
 Pt's medication was sent to pt's pharmacy as requested. Confirmation received.

## 2024-04-02 NOTE — Telephone Encounter (Signed)
*  STAT* If patient is at the pharmacy, call can be transferred to refill team.   1. Which medications need to be refilled? (please list name of each medication and dose if known) sacubitril -valsartan  (ENTRESTO ) 97-103 MG    2. Would you like to learn more about the convenience, safety, & potential cost savings by using the [facility]?     3. Are you open to using the [facility] (Type [facility].  ).   4. Which pharmacy/location (including street and city if local pharmacy) is medication to be sent to? [place] [facility]    5. Do they need a 30 day or 90 day supply? 90 day

## 2024-06-25 ENCOUNTER — Other Ambulatory Visit (HOSPITAL_COMMUNITY): Payer: Self-pay
